# Patient Record
Sex: Male | Born: 1940
Health system: Southern US, Community
[De-identification: ages and names within clinical notes are randomized; demographics above are authoritative.]

## PROBLEM LIST (undated history)

## (undated) DIAGNOSIS — R0602 Shortness of breath: Secondary | ICD-10-CM

## (undated) DIAGNOSIS — N3941 Urge incontinence: Secondary | ICD-10-CM

## (undated) DIAGNOSIS — Z87442 Personal history of urinary calculi: Secondary | ICD-10-CM

## (undated) DIAGNOSIS — R972 Elevated prostate specific antigen [PSA]: Secondary | ICD-10-CM

## (undated) DIAGNOSIS — I6529 Occlusion and stenosis of unspecified carotid artery: Secondary | ICD-10-CM

## (undated) DIAGNOSIS — N200 Calculus of kidney: Secondary | ICD-10-CM

## (undated) DIAGNOSIS — G4733 Obstructive sleep apnea (adult) (pediatric): Secondary | ICD-10-CM

## (undated) DIAGNOSIS — N111 Chronic obstructive pyelonephritis: Secondary | ICD-10-CM

## (undated) DIAGNOSIS — N2889 Other specified disorders of kidney and ureter: Secondary | ICD-10-CM

## (undated) DIAGNOSIS — J3489 Other specified disorders of nose and nasal sinuses: Secondary | ICD-10-CM

## (undated) DIAGNOSIS — F418 Other specified anxiety disorders: Secondary | ICD-10-CM

## (undated) DIAGNOSIS — T8859XA Other complications of anesthesia, initial encounter: Secondary | ICD-10-CM

## (undated) DIAGNOSIS — N401 Enlarged prostate with lower urinary tract symptoms: Secondary | ICD-10-CM

## (undated) DIAGNOSIS — E785 Hyperlipidemia, unspecified: Secondary | ICD-10-CM

## (undated) DIAGNOSIS — N138 Other obstructive and reflux uropathy: Secondary | ICD-10-CM

## (undated) DIAGNOSIS — I480 Paroxysmal atrial fibrillation: Secondary | ICD-10-CM

## (undated) DIAGNOSIS — M810 Age-related osteoporosis without current pathological fracture: Secondary | ICD-10-CM

## (undated) DIAGNOSIS — R011 Cardiac murmur, unspecified: Secondary | ICD-10-CM

## (undated) DIAGNOSIS — I493 Ventricular premature depolarization: Secondary | ICD-10-CM

## (undated) DIAGNOSIS — L570 Actinic keratosis: Secondary | ICD-10-CM

## (undated) HISTORY — PX: CYSTOSCOPY: SUR368

## (undated) HISTORY — DX: Chronic obstructive pyelonephritis: N11.1

## (undated) HISTORY — DX: Obstructive sleep apnea (adult) (pediatric): G47.33

## (undated) HISTORY — DX: Calculus of kidney: N20.0

## (undated) HISTORY — PX: TONSILECTOMY, ADENOIDECTOMY, BILATERAL MYRINGOTOMY AND TUBES: SHX2538

## (undated) HISTORY — DX: Urge incontinence: N39.41

## (undated) HISTORY — DX: Age-related osteoporosis without current pathological fracture: M81.0

## (undated) HISTORY — DX: Occlusion and stenosis of unspecified carotid artery: I65.29

## (undated) HISTORY — DX: Elevated prostate specific antigen (PSA): R97.20

## (undated) HISTORY — DX: Other obstructive and reflux uropathy: N40.1

## (undated) HISTORY — DX: Ventricular premature depolarization: I49.3

## (undated) HISTORY — DX: Other specified disorders of nose and nasal sinuses: J34.89

## (undated) HISTORY — DX: Other specified anxiety disorders: F41.8

## (undated) HISTORY — DX: Benign prostatic hyperplasia with lower urinary tract symptoms: N13.8

## (undated) HISTORY — DX: Actinic keratosis: L57.0

## (undated) HISTORY — DX: Other specified disorders of kidney and ureter: N28.89

## (undated) HISTORY — DX: Hyperlipidemia, unspecified: E78.5

---

## 2004-01-09 ENCOUNTER — Emergency Department (HOSPITAL_COMMUNITY): Admission: AD | Admit: 2004-01-09 | Discharge: 2004-01-09 | Payer: Self-pay | Admitting: Family Medicine

## 2007-12-14 LAB — HM COLONOSCOPY

## 2008-02-11 HISTORY — PX: COLONOSCOPY: SHX174

## 2011-12-28 DIAGNOSIS — Z87442 Personal history of urinary calculi: Secondary | ICD-10-CM | POA: Insufficient documentation

## 2011-12-28 DIAGNOSIS — Z8709 Personal history of other diseases of the respiratory system: Secondary | ICD-10-CM | POA: Insufficient documentation

## 2012-01-18 DIAGNOSIS — F419 Anxiety disorder, unspecified: Secondary | ICD-10-CM | POA: Insufficient documentation

## 2012-03-06 DIAGNOSIS — R5383 Other fatigue: Secondary | ICD-10-CM | POA: Insufficient documentation

## 2012-03-06 DIAGNOSIS — R42 Dizziness and giddiness: Secondary | ICD-10-CM | POA: Insufficient documentation

## 2012-03-13 HISTORY — PX: CARDIOVASCULAR STRESS TEST: SHX262

## 2012-03-13 HISTORY — PX: CARDIAC CATHETERIZATION: SHX172

## 2012-03-13 HISTORY — PX: US ECHOCARDIOGRAPHY: HXRAD669

## 2012-03-16 DIAGNOSIS — R9431 Abnormal electrocardiogram [ECG] [EKG]: Secondary | ICD-10-CM | POA: Insufficient documentation

## 2012-03-30 DIAGNOSIS — R0600 Dyspnea, unspecified: Secondary | ICD-10-CM | POA: Insufficient documentation

## 2012-03-30 DIAGNOSIS — I519 Heart disease, unspecified: Secondary | ICD-10-CM | POA: Insufficient documentation

## 2012-12-13 DIAGNOSIS — N200 Calculus of kidney: Secondary | ICD-10-CM

## 2012-12-13 DIAGNOSIS — R972 Elevated prostate specific antigen [PSA]: Secondary | ICD-10-CM

## 2012-12-13 HISTORY — DX: Elevated prostate specific antigen (PSA): R97.20

## 2012-12-13 HISTORY — DX: Calculus of kidney: N20.0

## 2013-01-13 HISTORY — PX: CATARACT EXTRACTION: SUR2

## 2013-09-12 ENCOUNTER — Ambulatory Visit: Payer: Medicare Other | Admitting: Family Medicine

## 2013-11-27 ENCOUNTER — Ambulatory Visit (INDEPENDENT_AMBULATORY_CARE_PROVIDER_SITE_OTHER): Payer: Medicare Other | Admitting: Family Medicine

## 2013-11-27 ENCOUNTER — Encounter: Payer: Self-pay | Admitting: Family Medicine

## 2013-11-27 VITALS — BP 122/68 | HR 67 | Temp 97.6°F | Ht 66.0 in | Wt 154.5 lb

## 2013-11-27 DIAGNOSIS — R972 Elevated prostate specific antigen [PSA]: Secondary | ICD-10-CM

## 2013-11-27 DIAGNOSIS — I4949 Other premature depolarization: Secondary | ICD-10-CM

## 2013-11-27 DIAGNOSIS — I493 Ventricular premature depolarization: Secondary | ICD-10-CM | POA: Insufficient documentation

## 2013-11-27 DIAGNOSIS — Z87442 Personal history of urinary calculi: Secondary | ICD-10-CM | POA: Insufficient documentation

## 2013-11-27 DIAGNOSIS — N200 Calculus of kidney: Secondary | ICD-10-CM

## 2013-11-27 DIAGNOSIS — F341 Dysthymic disorder: Secondary | ICD-10-CM

## 2013-11-27 DIAGNOSIS — F418 Other specified anxiety disorders: Secondary | ICD-10-CM

## 2013-11-27 MED ORDER — HYDROCODONE-ACETAMINOPHEN 5-325 MG PO TABS: 1.0000 | ORAL_TABLET | ORAL | Status: DC | PRN

## 2013-11-27 MED ORDER — CLONAZEPAM 1 MG PO TABS: 1.0000 mg | ORAL_TABLET | Freq: Two times a day (BID) | ORAL | Status: DC

## 2013-11-27 NOTE — Assessment & Plan Note (Signed)
Stable on metoprolol XL.  Denies h/o HTN or OSA.

## 2013-11-27 NOTE — Progress Notes (Signed)
Pre-visit discussion using our clinic review tool. No additional management support is needed unless otherwise documented below in the visit note.  

## 2013-11-27 NOTE — Progress Notes (Signed)
Subjective:    Patient ID: [name], male    DOB: [birth year], 72 y.o.   MRN: [id number]  HPI CC: new pt to establish  Recently moved from [facility].  Saw Dr. Debroah Loop then Dr Thora Lance. In process of getting back together with wife [name].  She has 2 step sons.  On hydrocodone for h/o kidney stones.  Has had 3 kidney stones in last 5 months.  Prior saw Dr. Edwin Cap of [facility] [phone] Elevated PSA - Has had 3 prostate biopsies, per patient benign.  Last biopsy unsure date.  Sees urology every 6-12 months. On klonopin for anxiety.  Overall doing well with this. Pending cataract surgery  Lives alone currently in apartment 1 grown daughter Working on getting back together with wife - [name] (legally separated) Occupation: retired, worked in Retail buyer for 30 yrs Edu: GED Activity: walking some Diet: good wtaer, fruits/vegetables daily.  Advanced directives: daughter is HCPOA for now  Preventative: Last CPE about 1 year ago Flu shot - at [facility] Pneumovax 2012 Tdap 2012 zostavax - unsure  Medications and allergies reviewed and updated in chart.  Past histories reviewed and updated if relevant as below. Patient Active Problem List   Diagnosis Date Noted  . Recurrent kidney stones   . Elevated PSA   . Depression with anxiety   . Symptomatic PVCs    Past Medical History  Diagnosis Date  . Recurrent kidney stones 2014    sees urology, hydrocodone prn  . Elevated PSA 2014    sees urology - norma biopsies x 3.5  . Depression with anxiety   . Nasal obstruction     congenital R sided  . OSA (obstructive sleep apnea)     pt denies  . Symptomatic PVCs     per prior cardiologist, on beta blocker   Past Surgical History  Procedure Laterality Date  . Tonsilectomy, adenoidectomy, bilateral myringotomy and tubes      removed as a child  . Cardiac catheterization  03/2012    EF 45-50%, no LVH, + impaired LV relaxation, no PH, no valve abnormalities    History  Substance Use Topics  . Smoking status: Never Smoker   . Smokeless tobacco: Never Used  . Alcohol Use: No   Family History  Problem Relation Age of Onset  . Diabetes Neg Hx   . Cancer Neg Hx   . CAD Neg Hx   . Stroke Neg Hx   . Hypertension Neg Hx    No Known Allergies No current outpatient prescriptions on file prior to visit.   No current facility-administered medications on file prior to visit.     Review of Systems Per HPI    Objective:   Physical Exam  Nursing note and vitals reviewed. Constitutional: He is oriented to person, place, and time. He appears well-developed and well-nourished. No distress.  HENT:  Head: Normocephalic and atraumatic.  Right Ear: Hearing, tympanic membrane, external ear and ear canal normal.  Left Ear: Hearing, tympanic membrane, external ear and ear canal normal.  Nose: Nose normal.  Mouth/Throat: Oropharynx is clear and moist. No oropharyngeal exudate.  Eyes: Conjunctivae and EOM are normal. Pupils are equal, round, and reactive to light. No scleral icterus.  Neck: Normal range of motion. Neck supple. No thyromegaly present.  Cardiovascular: Normal rate, regular rhythm, normal heart sounds and intact distal pulses.   No murmur heard. Pulses:      Radial pulses are 2+ on the right side, and  2+ on the left side.  Pulmonary/Chest: Effort normal and breath sounds normal. No respiratory distress. He has no wheezes. He has no rales.  Musculoskeletal: Normal range of motion. He exhibits no edema.  Lymphadenopathy:    He has no cervical adenopathy.  Neurological: He is alert and oriented to person, place, and time.  CN grossly intact, station and gait intact  Skin: Skin is warm and dry. No rash noted.  Psychiatric: He has a normal mood and affect. His behavior is normal. Judgment and thought content normal.       Assessment & Plan:

## 2013-11-27 NOTE — Assessment & Plan Note (Signed)
Prior saw Dr. Edwin Cap of [facility].  Requests referral to establish with local urologist - states last PSA was around 13, and he has had 3 normal prostate biopsies. I will request records from urology and try to forward them on to urology

## 2013-11-27 NOTE — Assessment & Plan Note (Signed)
I have requested records from urology to review metabolic workup pt states he has had States has had 10+ kidney stones in last several months.

## 2013-11-27 NOTE — Patient Instructions (Addendum)
See Marcelle Smiling on your way out to sign controlled substance agreement form and for urine drug screen Pass by Marion's office to schedule appointment with urology.  Sign release form for records from urologist Dr. Edwin Cap in [facility]. Good to meet you today, call us with questions. Return at your convenience in the next few months fasting for blood work and afterwards for physical.

## 2013-11-27 NOTE — Assessment & Plan Note (Signed)
Stable on klonopin 1mg  1/2 to 1 tablet twice daily.  Consider antidepressant down the road. Continue this med, will have patient fill out controlled substance agreement form as well as UDS today.

## 2013-12-18 ENCOUNTER — Encounter: Payer: Self-pay | Admitting: Family Medicine

## 2013-12-25 ENCOUNTER — Encounter: Payer: Self-pay | Admitting: Family Medicine

## 2014-01-22 ENCOUNTER — Other Ambulatory Visit: Payer: Self-pay | Admitting: Family Medicine

## 2014-01-22 NOTE — Telephone Encounter (Signed)
Rx called in as directed.   

## 2014-01-22 NOTE — Telephone Encounter (Signed)
plz phone in. 

## 2014-01-22 NOTE — Telephone Encounter (Signed)
Ok to refill 

## 2014-02-04 ENCOUNTER — Encounter: Payer: Self-pay | Admitting: Family Medicine

## 2014-02-04 DIAGNOSIS — N401 Enlarged prostate with lower urinary tract symptoms: Secondary | ICD-10-CM

## 2014-02-04 DIAGNOSIS — N138 Other obstructive and reflux uropathy: Secondary | ICD-10-CM | POA: Insufficient documentation

## 2014-02-11 ENCOUNTER — Other Ambulatory Visit: Payer: Self-pay | Admitting: Family Medicine

## 2014-02-11 DIAGNOSIS — N200 Calculus of kidney: Secondary | ICD-10-CM

## 2014-02-11 DIAGNOSIS — Z13220 Encounter for screening for lipoid disorders: Secondary | ICD-10-CM

## 2014-02-11 DIAGNOSIS — I493 Ventricular premature depolarization: Secondary | ICD-10-CM

## 2014-02-12 ENCOUNTER — Other Ambulatory Visit: Payer: Self-pay | Admitting: Family Medicine

## 2014-02-12 ENCOUNTER — Other Ambulatory Visit (INDEPENDENT_AMBULATORY_CARE_PROVIDER_SITE_OTHER): Payer: Medicare Other

## 2014-02-12 DIAGNOSIS — Z13220 Encounter for screening for lipoid disorders: Secondary | ICD-10-CM

## 2014-02-12 DIAGNOSIS — N200 Calculus of kidney: Secondary | ICD-10-CM

## 2014-02-12 DIAGNOSIS — R972 Elevated prostate specific antigen [PSA]: Secondary | ICD-10-CM

## 2014-02-12 DIAGNOSIS — I4949 Other premature depolarization: Secondary | ICD-10-CM

## 2014-02-12 DIAGNOSIS — I493 Ventricular premature depolarization: Secondary | ICD-10-CM

## 2014-02-12 LAB — COMPREHENSIVE METABOLIC PANEL
ALK PHOS: 56 U/L (ref 39–117)
ALT: 23 U/L (ref 0–53)
AST: 22 U/L (ref 0–37)
Albumin: 3.7 g/dL (ref 3.5–5.2)
BILIRUBIN TOTAL: 1.2 mg/dL (ref 0.3–1.2)
BUN: 17 mg/dL (ref 6–23)
CO2: 27 meq/L (ref 19–32)
Calcium: 8.9 mg/dL (ref 8.4–10.5)
Chloride: 106 mEq/L (ref 96–112)
Creatinine, Ser: 1.2 mg/dL (ref 0.4–1.5)
GFR: 64.04 mL/min (ref >60.00)
GLUCOSE: 108 mg/dL — AB (ref 70–99)
Potassium: 3.9 mEq/L (ref 3.5–5.1)
Sodium: 139 mEq/L (ref 135–145)
TOTAL PROTEIN: 6.3 g/dL (ref 6.0–8.3)

## 2014-02-12 LAB — LIPID PANEL
CHOLESTEROL: 180 mg/dL (ref 0–200)
HDL: 42.9 mg/dL (ref >39.00)
LDL CALC: 119 mg/dL — AB (ref 0–99)
TRIGLYCERIDES: 89 mg/dL (ref 0.0–149.0)
Total CHOL/HDL Ratio: 4
VLDL: 17.8 mg/dL (ref 0.0–40.0)

## 2014-02-12 LAB — PSA: PSA: 14.3 ng/mL — AB (ref 0.10–4.00)

## 2014-02-12 LAB — TSH: TSH: 1.49 u[IU]/mL (ref 0.35–5.50)

## 2014-02-19 ENCOUNTER — Ambulatory Visit (INDEPENDENT_AMBULATORY_CARE_PROVIDER_SITE_OTHER): Payer: Medicare Other | Admitting: Family Medicine

## 2014-02-19 ENCOUNTER — Encounter: Payer: Self-pay | Admitting: Family Medicine

## 2014-02-19 VITALS — BP 122/70 | HR 64 | Temp 97.6°F | Ht 67.5 in | Wt 156.1 lb

## 2014-02-19 DIAGNOSIS — F341 Dysthymic disorder: Secondary | ICD-10-CM

## 2014-02-19 DIAGNOSIS — R7303 Prediabetes: Secondary | ICD-10-CM | POA: Insufficient documentation

## 2014-02-19 DIAGNOSIS — Z23 Encounter for immunization: Secondary | ICD-10-CM

## 2014-02-19 DIAGNOSIS — R972 Elevated prostate specific antigen [PSA]: Secondary | ICD-10-CM

## 2014-02-19 DIAGNOSIS — R7309 Other abnormal glucose: Secondary | ICD-10-CM

## 2014-02-19 DIAGNOSIS — Z0000 Encounter for general adult medical examination without abnormal findings: Secondary | ICD-10-CM | POA: Insufficient documentation

## 2014-02-19 DIAGNOSIS — R739 Hyperglycemia, unspecified: Secondary | ICD-10-CM

## 2014-02-19 DIAGNOSIS — F418 Other specified anxiety disorders: Secondary | ICD-10-CM

## 2014-02-19 NOTE — Addendum Note (Signed)
Addended by: Royann Shivers A on: 02/19/2014 04:45 PM   Modules accepted: Orders

## 2014-02-19 NOTE — Progress Notes (Signed)
BP 122/70  Pulse 64  Temp(Src) 97.6 F (36.4 C) (Oral)  Ht 5' 7.5" (1.715 m)  Wt 156 lb 1.9 oz (70.816 kg)  BMI 24.08 kg/m2   CC: medicare wellness visit  Subjective:    Patient ID: [name], male    DOB: 1941/08/02, 73 y.o.   MRN: [id number]  HPI: [name] is a 73 y.o. male presenting on 02/19/2014 with Annual Exam   H/o elevated PSA - followed by Dr [name]. Anxiety stable on klonopin 1mg  bid.  Passes hearing and vision screens today. Denies falls, depression, anhedonia  Lives alone currently in apartment  1 grown daughter  Working on getting back together with wife - Allah Reason (legally separated)  Occupation: retired, worked in Public relations account executive for 30 yrs  Edu: GED  Activity: walking some  Diet: good wtaer, fruits/vegetables daily.   Preventative:  Last CPE about 1 year ago  Colon cancer screening - mod diverticulosis o/w WNL (2009) Prostate cancer screening - followed by urology Flu shot - 10/2013 Pneumovax 01/2011 Tdap 01/2011  zostavax - unsure  Advanced directives: daughter is HCPOA for now   Relevant past medical, surgical, family and social history reviewed and updated as indicated.  Allergies and medications reviewed and updated. Current Outpatient Prescriptions on File Prior to Visit  Medication Sig  . bisacodyl (DULCOLAX) 5 MG EC tablet Take 5 mg by mouth once a week.  . clonazePAM (KLONOPIN) 1 MG tablet TAKE 1 TABLET BY MOUTH TWICE DAILY  . HYDROcodone-acetaminophen (NORCO/VICODIN) 5-325 MG per tablet Take 1 tablet by mouth every 4 (four) hours as needed for moderate pain.  . metoprolol succinate (TOPROL-XL) 25 MG 24 hr tablet TAKE 1 TABLET BY MOUTH ONCE A DAY   No current facility-administered medications on file prior to visit.    Review of Systems  Constitutional: Negative for fever, chills, activity change, appetite change, fatigue and unexpected weight change.  HENT: Negative for hearing loss.   Eyes: Positive for visual disturbance  (pending cataract surgery).  Respiratory: Negative for cough, chest tightness, shortness of breath and wheezing.   Cardiovascular: Negative for chest pain, palpitations and leg swelling.  Gastrointestinal: Negative for nausea, vomiting, abdominal pain, diarrhea, constipation, blood in stool and abdominal distention.  Genitourinary: Negative for hematuria and difficulty urinating.  Musculoskeletal: Negative for arthralgias, myalgias and neck pain.  Skin: Negative for rash.  Neurological: Negative for dizziness, seizures, syncope and headaches.  Hematological: Negative for adenopathy. Does not bruise/bleed easily.  Psychiatric/Behavioral: Negative for dysphoric mood. The patient is not nervous/anxious.    Per HPI unless specifically indicated above    Objective:    BP 122/70  Pulse 64  Temp(Src) 97.6 F (36.4 C) (Oral)  Ht 5' 7.5" (1.715 m)  Wt 156 lb 1.9 oz (70.816 kg)  BMI 24.08 kg/m2  Physical Exam  Nursing note and vitals reviewed. Constitutional: He is oriented to person, place, and time. He appears well-developed and well-nourished. No distress.  HENT:  Head: Normocephalic and atraumatic.  Right Ear: Hearing, tympanic membrane, external ear and ear canal normal.  Left Ear: Hearing, tympanic membrane, external ear and ear canal normal.  Nose: Nose normal.  Mouth/Throat: Uvula is midline, oropharynx is clear and moist and mucous membranes are normal. No oropharyngeal exudate, posterior oropharyngeal edema or posterior oropharyngeal erythema.  Eyes: Conjunctivae and EOM are normal. Pupils are equal, round, and reactive to light. No scleral icterus.  Neck: Normal range of motion. Neck supple.  Cardiovascular: Normal rate, regular rhythm, normal heart  sounds and intact distal pulses.   No murmur heard. Pulses:      Radial pulses are 2+ on the right side, and 2+ on the left side.  Pulmonary/Chest: Effort normal and breath sounds normal. No respiratory distress. He has no wheezes.  He has no rales.  Abdominal: Soft. Bowel sounds are normal. He exhibits no distension and no mass. There is no tenderness. There is no rebound and no guarding.  Genitourinary:  deferred  Musculoskeletal: Normal range of motion. He exhibits no edema.  Lymphadenopathy:    He has no cervical adenopathy.  Neurological: He is alert and oriented to person, place, and time.  CN grossly intact, station and gait intact 2/3 recall 5/5 calculation serial 3s  Skin: Skin is warm and dry. No rash noted.  Psychiatric: He has a normal mood and affect. His behavior is normal. Judgment and thought content normal.   Results for orders placed in visit on 02/19/14  HM COLONOSCOPY      Result Value Ref Range   HM Colonoscopy diverticulosis        Assessment & Plan:   Problem List Items Addressed This Visit   Depression with anxiety     Chronic, stable. Continue klonopin 1mg  1/2 to 1 tablet bid. Pt does not desire to change regimen for now.    Elevated PSA     Continue follow up with Dr. [name].  Appreciate urology care of patient. PSA recently elevated, in h/o chronic PSA elevation.    Medicare annual wellness visit, subsequent - Primary     I have personally reviewed the Medicare Annual Wellness questionnaire and have noted 1. The patient's medical and social history 2. Their use of alcohol, tobacco or illicit drugs 3. Their current medications and supplements 4. The patient's functional ability including ADL's, fall risks, home safety risks and hearing or visual impairment. 5. Diet and physical activity 6. Evidence for depression or mood disorders The patients weight, height, BMI have been recorded in the chart.  Hearing and vision has been addressed. I have made referrals, counseling and provided education to the patient based review of the above and I have provided the pt with a written personalized care plan for preventive services. See scanned questionairre. Advanced directives discussed:  would want daughter to be HCPOA for now.  Reviewed preventative protocols and updated unless pt declined. prevnar today UTD colonoscopy Prostate followed by Dr. [name].        Follow up plan: Return in about 1 year (around 02/20/2015), or as needed, for medicare wellness visit.

## 2014-02-19 NOTE — Patient Instructions (Addendum)
prevnar today Call your insurance about the shingles shot to see if it is covered or how much it would cost and where is cheaper (here or pharmacy).  If you want to receive here, call for nurse visit. Good to see you today, call us with questions. Watch added sugar in diet. Return as needed or in 1 year for next wellness exam.

## 2014-02-19 NOTE — Assessment & Plan Note (Signed)
Chronic, stable. Continue klonopin 1mg  1/2 to 1 tablet bid. Pt does not desire to change regimen for now.

## 2014-02-19 NOTE — Assessment & Plan Note (Signed)
Discussed elevated sugar. Recommend limit added sugars in diet, limit sweetened beverages

## 2014-02-19 NOTE — Assessment & Plan Note (Signed)
Continue follow up with Dr. [name].  Appreciate urology care of patient. PSA recently elevated, in h/o chronic PSA elevation.

## 2014-02-19 NOTE — Assessment & Plan Note (Signed)
I have personally reviewed the Medicare Annual Wellness questionnaire and have noted 1. The patient's medical and social history 2. Their use of alcohol, tobacco or illicit drugs 3. Their current medications and supplements 4. The patient's functional ability including ADL's, fall risks, home safety risks and hearing or visual impairment. 5. Diet and physical activity 6. Evidence for depression or mood disorders The patients weight, height, BMI have been recorded in the chart.  Hearing and vision has been addressed. I have made referrals, counseling and provided education to the patient based review of the above and I have provided the pt with a written personalized care plan for preventive services. See scanned questionairre. Advanced directives discussed: would want daughter to be HCPOA for now.  Reviewed preventative protocols and updated unless pt declined. prevnar today UTD colonoscopy Prostate followed by Dr. [name].

## 2014-02-19 NOTE — Progress Notes (Signed)
Pre visit review using our clinic review tool, if applicable. No additional management support is needed unless otherwise documented below in the visit note. 

## 2014-03-10 ENCOUNTER — Encounter: Payer: Self-pay | Admitting: Family Medicine

## 2014-04-18 ENCOUNTER — Other Ambulatory Visit: Payer: Self-pay | Admitting: Family Medicine

## 2014-04-18 NOTE — Telephone Encounter (Signed)
Rx called in as directed.   

## 2014-04-18 NOTE — Telephone Encounter (Signed)
plz phone in. 

## 2014-04-19 ENCOUNTER — Other Ambulatory Visit: Payer: Medicare Other

## 2014-04-30 ENCOUNTER — Encounter: Payer: Self-pay | Admitting: Family Medicine

## 2014-06-18 ENCOUNTER — Other Ambulatory Visit: Payer: Self-pay | Admitting: Family Medicine

## 2014-06-18 NOTE — Telephone Encounter (Signed)
plz phone in. 

## 2014-06-18 NOTE — Telephone Encounter (Signed)
Ok to refill 

## 2014-06-19 NOTE — Telephone Encounter (Signed)
Rx called in as directed.   

## 2014-08-20 ENCOUNTER — Other Ambulatory Visit: Payer: Self-pay | Admitting: Family Medicine

## 2014-08-20 NOTE — Telephone Encounter (Signed)
Ok to refill 

## 2014-08-21 NOTE — Telephone Encounter (Signed)
Plz phone in

## 2014-08-21 NOTE — Telephone Encounter (Signed)
Rx called in as directed.   

## 2014-09-09 ENCOUNTER — Ambulatory Visit: Payer: Medicare Other

## 2014-09-12 DIAGNOSIS — N2889 Other specified disorders of kidney and ureter: Secondary | ICD-10-CM

## 2014-09-12 DIAGNOSIS — N111 Chronic obstructive pyelonephritis: Secondary | ICD-10-CM

## 2014-09-12 HISTORY — DX: Other specified disorders of kidney and ureter: N28.89

## 2014-09-12 HISTORY — DX: Chronic obstructive pyelonephritis: N11.1

## 2014-09-13 ENCOUNTER — Ambulatory Visit (INDEPENDENT_AMBULATORY_CARE_PROVIDER_SITE_OTHER): Payer: Medicare Other | Admitting: Family Medicine

## 2014-09-13 ENCOUNTER — Encounter: Payer: Self-pay | Admitting: Family Medicine

## 2014-09-13 VITALS — BP 130/72 | HR 47 | Temp 97.9°F | Wt 157.2 lb

## 2014-09-13 DIAGNOSIS — R29898 Other symptoms and signs involving the musculoskeletal system: Secondary | ICD-10-CM

## 2014-09-13 DIAGNOSIS — M545 Low back pain, unspecified: Secondary | ICD-10-CM

## 2014-09-13 DIAGNOSIS — M549 Dorsalgia, unspecified: Secondary | ICD-10-CM | POA: Insufficient documentation

## 2014-09-13 DIAGNOSIS — I493 Ventricular premature depolarization: Secondary | ICD-10-CM

## 2014-09-13 NOTE — Assessment & Plan Note (Signed)
Mild bradycardia noted today and pt endorses some fatigue - will decrease metoprolol xl to 12.5mg  daily.

## 2014-09-13 NOTE — Progress Notes (Signed)
BP 130/72  Pulse 47  Temp(Src) 97.9 F (36.6 C) (Oral)  Wt 157 lb 4 oz (71.328 kg)  SpO2 97%   CC: back pain, knee pain  Subjective:    Patient ID: [name], male    DOB: 02/18/1941, 73 y.o.   MRN: [id number]  HPI: [name] is a 73 y.o. 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 Pain has improved but persistent (left lower back). No radiation down legs, denies numbness or weakness of legs as well. No fevers/chills. So far has tried nothing other than ibuprofen 200mg . Initially also treated with heating pad.  L knee "cracking/popping" - ongoing for last 1.5 yrs, worse with climbing stairs, some popping with flexion/extension. Now starting to have R popping pain as well. L>R. No pain.  Has hydrocodone for kidney stone pain, hasn't tried for anything else.  Bradycardia - HR today 47. Endorses some fatigue.  Relevant past medical, surgical, family and social history reviewed and updated as indicated.  Allergies and medications reviewed and updated. Current Outpatient Prescriptions on File Prior to Visit  Medication Sig  . bisacodyl (DULCOLAX) 5 MG EC tablet Take 5 mg by mouth once a week.  . clonazePAM (KLONOPIN) 1 MG tablet TAKE 1 TABLET BY MOUTH 2 TIMES A DAY AS NEEDED  . HYDROcodone-acetaminophen (NORCO/VICODIN) 5-325 MG per tablet Take 1 tablet by mouth every 4 (four) hours as needed for moderate pain.  . Omega-3 Fatty Acids (FISH OIL) 1000 MG CAPS Take 1 capsule by mouth 2 (two) times daily.   No current facility-administered medications on file prior to visit.   Past Medical History  Diagnosis Date  . Recurrent kidney stones 2014    sees urology [name]), hydrocodone prn  . Elevated PSA 2014    sees urology - normal biopsies x 3  . Depression with anxiety   . Nasal obstruction     congenital R sided  . OSA (obstructive sleep  apnea)     pt denies  . Symptomatic PVCs     per prior cardiologist, on beta blocker  . BPH with urinary obstruction     sees urology Dr. [name]    Past Surgical History  Procedure Laterality Date  . Tonsilectomy, adenoidectomy, bilateral myringotomy and tubes      removed as a child  . Cardiac catheterization  03/2012    EF 45-50%, no LVH, + impaired LV relaxation, no PH, no valve abnormalities  . US echocardiography  03/2012    hypokinetic anterior wall, EF 45-50%, impaired relaxation pattern, mild LA dilation, mild-mod MR, mild-mod AR  . Colonoscopy  02/2008    mod diverticulosis o/w WNL (2009)  . Cataract extraction Right 01/2013  . Cystoscopy    . Total hip arthroplasty Left   . Total hip arthroplasty Right   . Cardiovascular stress test  03/2012    mild peri infarct ischemia in apical segment, decreased EF   Review of Systems Per HPI unless specifically indicated above    Objective:    BP 130/72  Pulse 47  Temp(Src) 97.9 F (36.6 C) (Oral)  Wt 157 lb 4 oz (71.328 kg)  SpO2 97%  Physical Exam  Nursing note and vitals reviewed. Constitutional: He appears well-developed and well-nourished. No distress.  Cardiovascular: Normal rate, regular rhythm, normal heart sounds and intact distal pulses.   No  murmur heard. HR 60s  Musculoskeletal: He exhibits no edema.  No pain midline spine No paraspinous mm tenderness Neg SLR bilaterally. R knee WNL L Knee exam: No deformity on inspection. No pain with palpation of knee landmarks. No effusion/swelling noted. FROM in flex/extension without crepitus. No popliteal fullness. Neg drawer test. Neg mcmurray test. No pain with valgus/varus stress. No PFgrind. No abnormal patellar mobility.       Assessment & Plan:   Problem List Items Addressed This Visit   Symptomatic PVCs     Mild bradycardia noted today and pt endorses some fatigue - will decrease metoprolol xl to 12.5mg  daily.    Relevant Medications       metoprolol succinate (TOPROL-XL) 25 MG 24 hr tablet   Popping sound of knee joint     Anticipate early arthritis - provided with strengthening exercises for muscles around knee joint, suggested start vit D and glucosamine for prevention. Pt agrees with plan.    Left-sided back pain - Primary     Anticipate muscle strain. Treat with tylenol and heating pad. Less likely HNP given pain significantly improved. Neg SLR today. Update if recurring or worsening pain.        Follow up plan: Return if symptoms worsen or fail to improve.

## 2014-09-13 NOTE — Patient Instructions (Addendum)
Decrease metoprolol to 1/2 tablet daily (12.5mg  daily). For knees - I think this is early arthritis changes, but as no pain ok to monitor for now. Do exercises provided. Look into glucosamine (osteo bi flex) and vitamin D for joint health. For back - I think you have a strain of a back muscle. May use tylenol 500mg  twice daily for pain, may continue heating pad. Do exercises provided for lower back pain. Let us know if not improved with this.

## 2014-09-13 NOTE — Assessment & Plan Note (Signed)
Anticipate early arthritis - provided with strengthening exercises for muscles around knee joint, suggested start vit D and glucosamine for prevention. Pt agrees with plan.

## 2014-09-13 NOTE — Progress Notes (Signed)
Pre visit review using our clinic review tool, if applicable. No additional management support is needed unless otherwise documented below in the visit note. 

## 2014-09-13 NOTE — Assessment & Plan Note (Signed)
Anticipate muscle strain. Treat with tylenol and heating pad. Less likely HNP given pain significantly improved. Neg SLR today. Update if recurring or worsening pain.

## 2014-10-07 LAB — BASIC METABOLIC PANEL
Anion Gap: 8 (ref 7–16)
BUN: 29 mg/dL — AB (ref 7–18)
CALCIUM: 8.7 mg/dL (ref 8.5–10.1)
Chloride: 111 mmol/L — ABNORMAL HIGH (ref 98–107)
Co2: 25 mmol/L (ref 21–32)
Creatinine: 2.36 mg/dL — ABNORMAL HIGH (ref 0.60–1.30)
EGFR (African American): 35 — ABNORMAL LOW
GFR CALC NON AF AMER: 29 — AB
Glucose: 120 mg/dL — ABNORMAL HIGH (ref 65–99)
Osmolality: 294 (ref 275–301)
Potassium: 4 mmol/L (ref 3.5–5.1)
SODIUM: 144 mmol/L (ref 136–145)

## 2014-10-07 LAB — URINALYSIS, COMPLETE
BILIRUBIN, UR: NEGATIVE
Bacteria: NONE SEEN
Glucose,UR: NEGATIVE mg/dL (ref 0–75)
Leukocyte Esterase: NEGATIVE
Nitrite: NEGATIVE
Ph: 5 (ref 4.5–8.0)
Protein: NEGATIVE
RBC,UR: 17 /HPF (ref 0–5)
SQUAMOUS EPITHELIAL: NONE SEEN
Specific Gravity: 1.025 (ref 1.003–1.030)

## 2014-10-07 LAB — CBC WITH DIFFERENTIAL/PLATELET
Basophil #: 0.1 10*3/uL (ref 0.0–0.1)
Basophil %: 0.4 %
EOS ABS: 0 10*3/uL (ref 0.0–0.7)
EOS PCT: 0.3 %
HCT: 46.4 % (ref 40.0–52.0)
HGB: 14.8 g/dL (ref 13.0–18.0)
LYMPHS PCT: 4.6 %
Lymphocyte #: 0.7 10*3/uL — ABNORMAL LOW (ref 1.0–3.6)
MCH: 29.5 pg (ref 26.0–34.0)
MCHC: 32 g/dL (ref 32.0–36.0)
MCV: 92 fL (ref 80–100)
MONO ABS: 1.5 x10 3/mm — AB (ref 0.2–1.0)
MONOS PCT: 10.5 %
NEUTROS PCT: 84.2 %
Neutrophil #: 11.9 10*3/uL — ABNORMAL HIGH (ref 1.4–6.5)
Platelet: 90 10*3/uL — ABNORMAL LOW (ref 150–440)
RBC: 5.03 10*6/uL (ref 4.40–5.90)
RDW: 14 % (ref 11.5–14.5)
WBC: 14.2 10*3/uL — AB (ref 3.8–10.6)

## 2014-10-08 ENCOUNTER — Inpatient Hospital Stay: Payer: Self-pay | Admitting: Urology

## 2014-10-08 ENCOUNTER — Ambulatory Visit: Payer: Self-pay | Admitting: Urology

## 2014-10-08 LAB — CBC WITH DIFFERENTIAL/PLATELET
Basophil #: 0 10*3/uL (ref 0.0–0.1)
Basophil %: 0.3 %
EOS ABS: 0 10*3/uL (ref 0.0–0.7)
Eosinophil %: 0.3 %
HCT: 40.4 % (ref 40.0–52.0)
HGB: 12.7 g/dL — AB (ref 13.0–18.0)
LYMPHS ABS: 0.9 10*3/uL — AB (ref 1.0–3.6)
Lymphocyte %: 8.4 %
MCH: 29.3 pg (ref 26.0–34.0)
MCHC: 31.4 g/dL — AB (ref 32.0–36.0)
MCV: 93 fL (ref 80–100)
MONOS PCT: 10.2 %
Monocyte #: 1 x10 3/mm (ref 0.2–1.0)
NEUTROS ABS: 8.3 10*3/uL — AB (ref 1.4–6.5)
Neutrophil %: 80.8 %
Platelet: 85 10*3/uL — ABNORMAL LOW (ref 150–440)
RBC: 4.32 10*6/uL — AB (ref 4.40–5.90)
RDW: 14 % (ref 11.5–14.5)
WBC: 10.3 10*3/uL (ref 3.8–10.6)

## 2014-10-08 LAB — BASIC METABOLIC PANEL
ANION GAP: 7 (ref 7–16)
BUN: 27 mg/dL — AB (ref 7–18)
CALCIUM: 8 mg/dL — AB (ref 8.5–10.1)
Chloride: 114 mmol/L — ABNORMAL HIGH (ref 98–107)
Co2: 26 mmol/L (ref 21–32)
Creatinine: 1.78 mg/dL — ABNORMAL HIGH (ref 0.60–1.30)
EGFR (African American): 48 — ABNORMAL LOW
GFR CALC NON AF AMER: 40 — AB
Glucose: 120 mg/dL — ABNORMAL HIGH (ref 65–99)
OSMOLALITY: 299 (ref 275–301)
POTASSIUM: 4.2 mmol/L (ref 3.5–5.1)
Sodium: 147 mmol/L — ABNORMAL HIGH (ref 136–145)

## 2014-10-08 LAB — GRAM STAIN

## 2014-10-09 ENCOUNTER — Telehealth: Payer: Self-pay

## 2014-10-09 LAB — URINE CULTURE

## 2014-10-09 NOTE — Telephone Encounter (Signed)
[name] pts daughter said pt was discharged from [facility] and this morning pts urinary catheter is leaking at one of the connections; pt is also in a lot of pain. Pt is supposed to f/u with Dr Karna Dupes the urologist after discharge from [facility]. [name] will call Dr Ernest Mallick office to see if pt can be seen or what to do about pain and catheter leaking. [name] will call our office back if needed.

## 2014-10-12 LAB — CULTURE, BLOOD (SINGLE)

## 2014-10-13 HISTORY — PX: CYSTOSCOPY: SUR368

## 2014-10-14 ENCOUNTER — Ambulatory Visit (INDEPENDENT_AMBULATORY_CARE_PROVIDER_SITE_OTHER): Payer: Medicare Other | Admitting: Family Medicine

## 2014-10-14 ENCOUNTER — Encounter: Payer: Self-pay | Admitting: Family Medicine

## 2014-10-14 VITALS — BP 126/66 | HR 56 | Temp 97.4°F | Wt 150.0 lb

## 2014-10-14 DIAGNOSIS — I493 Ventricular premature depolarization: Secondary | ICD-10-CM

## 2014-10-14 DIAGNOSIS — Z01818 Encounter for other preprocedural examination: Secondary | ICD-10-CM | POA: Insufficient documentation

## 2014-10-14 DIAGNOSIS — N200 Calculus of kidney: Secondary | ICD-10-CM

## 2014-10-14 NOTE — Assessment & Plan Note (Signed)
Upcoming procedure. Will await labs and then fax today's note to Dr. Hollice Espy.

## 2014-10-14 NOTE — Patient Instructions (Signed)
Blood work today. We will fax today's note to Dr Hollice Espy urologist in [facility]. You should do well with surgery, then work hard to regain strength. Call us with questions.

## 2014-10-14 NOTE — Assessment & Plan Note (Signed)
Pending eval by Dr Ubaldo Glassing. Pt continues Toprol XL 12.5mg  daily.

## 2014-10-14 NOTE — Assessment & Plan Note (Addendum)
Pt is medically cleared for his upcoming urological procedure. Check CBC, BMP and PT/INR today. Did not check CXR. Pt will have EKG at upcoming cardiology clearance appointment. Discussed graduated compression stockings with patient, discussed importance of speedy ambulation. Would recommend caution with narcotics as he seems very sensitive to their effect (specifically hydrocodone noted today).

## 2014-10-14 NOTE — Progress Notes (Signed)
Pre visit review using our clinic review tool, if applicable. No additional management support is needed unless otherwise documented below in the visit note. 

## 2014-10-14 NOTE — Progress Notes (Addendum)
BP 126/66 mmHg  Pulse 56  Temp(Src) 97.4 F (36.3 C) (Oral)  Wt 150 lb (68.04 kg)   CC: medical clearance  Subjective:    Patient ID: [name], male    DOB: 04/13/1941, 73 y.o.   MRN: [id number]  HPI: [name] is a 73 y.o. male presenting on 10/14/2014 for Medical Clearance   Presents with [name] his daughter who lives in [place], [place]   Has appt with cards on Wednesday for cardiac clearance for symptomatic PVCs (Dr Ubaldo Glassing at [facility]).   Seen at [facility] with bladder infection and multiple kidney stones s/p L stent placement (Dr. Hollice Espy). Pending lithotripsy when infection cleared and stent removal. I do not have records of this. Prior saw Dr Barnie Del.   Denies current chest pain, tightness, dyspnea.  Has tolerated GETA in the past. Last surgery was last week - stent placement which he tolerated well.  No smoking hx. CARDIOVASCULAR STRESS TEST Date: 03/2012 mild peri infarct ischemia in apical segment, decreased EF   Relevant past medical, surgical, family and social history reviewed and updated as indicated.  Allergies and medications reviewed and updated. Current Outpatient Prescriptions on File Prior to Visit  Medication Sig  . clonazePAM (KLONOPIN) 1 MG tablet TAKE 1 TABLET BY MOUTH 2 TIMES A DAY AS NEEDED  . HYDROcodone-acetaminophen (NORCO/VICODIN) 5-325 MG per tablet Take 1 tablet by mouth every 4 (four) hours as needed for moderate pain.  . metoprolol succinate (TOPROL-XL) 25 MG 24 hr tablet TAKE 1/2 TABLET BY MOUTH ONCE A DAY  . bisacodyl (DULCOLAX) 5 MG EC tablet Take 5 mg by mouth once a week.  . Omega-3 Fatty Acids (FISH OIL) 1000 MG CAPS Take 1 capsule by mouth 2 (two) times daily.   No current facility-administered medications on file prior to visit.   Past Medical History  Diagnosis Date  . Recurrent kidney stones 2014    sees urology [name]), hydrocodone prn  . Elevated PSA 2014    sees urology - normal biopsies x 3  . Depression with  anxiety   . Nasal obstruction     congenital R sided  . OSA (obstructive sleep apnea)     pt denies  . Symptomatic PVCs     per prior cardiologist, on beta blocker  . BPH with urinary obstruction     sees urology Dr. [name]   Past Surgical History  Procedure Laterality Date  . Tonsilectomy, adenoidectomy, bilateral myringotomy and tubes      removed as a child  . Cardiac catheterization  03/2012    EF 45-50%, no LVH, + impaired LV relaxation, no PH, no valve abnormalities  . US echocardiography  03/2012    hypokinetic anterior wall, EF 45-50%, impaired relaxation pattern, mild LA dilation, mild-mod MR, mild-mod AR  . Colonoscopy  02/2008    mod diverticulosis o/w WNL (2009)  . Cataract extraction Right 01/2013  . Cystoscopy    . Total hip arthroplasty Left   . Total hip arthroplasty Right   . Cardiovascular stress test  03/2012    mild peri infarct ischemia in apical segment, decreased EF    Review of Systems Per HPI unless specifically indicated above    Objective:    BP 126/66 mmHg  Pulse 56  Temp(Src) 97.4 F (36.3 C) (Oral)  Wt 150 lb (68.04 kg)  Physical Exam  Constitutional: He appears well-developed and well-nourished. No distress.  Some slurring of speech after 1/2 hydrocodone earlier this morning  HENT:  Mouth/Throat: Oropharynx is clear and moist. No oropharyngeal exudate.  Cardiovascular: Normal rate, regular rhythm, normal heart sounds and intact distal pulses.   No murmur heard. Pulmonary/Chest: Effort normal and breath sounds normal. No respiratory distress. He has no wheezes. He has no rales.  Musculoskeletal: He exhibits no edema.  Skin: Skin is warm and dry. No rash noted.  Psychiatric: He has a normal mood and affect.  Nursing note and vitals reviewed.  Results for orders placed or performed in visit on 02/19/14  HM COLONOSCOPY  Result Value Ref Range   HM Colonoscopy diverticulosis       Assessment & Plan:   Problem List Items Addressed This  Visit    Symptomatic PVCs    Pending eval by Dr Ubaldo Glassing. Pt continues Toprol XL 12.5mg  daily.    Recurrent kidney stones    Upcoming procedure. Will await labs and then fax today's note to Dr. Hollice Espy.    Preoperative clearance - Primary    Pt is medically cleared for his upcoming urological procedure. Check CBC, BMP and PT/INR today. Did not check CXR. Pt will have EKG at upcoming cardiology clearance appointment. Discussed graduated compression stockings with patient, discussed importance of speedy ambulation. Would recommend caution with narcotics as he seems very sensitive to their effect (specifically hydrocodone noted today).    Relevant Orders      CBC with Differential      Basic metabolic panel      Protime-INR       Follow up plan: Return as needed.   ADDENDUM ==> reviewed hospitalization records admitted for L nephrolithiasis and obstructive pyelonephritis with emergent L stent placement and foley catheter for urinary retention. Also found to have L midpole renal mass. Cr 2.23 -> 1.78, GFR 40 Discharged on flomax, levaquin 250mg  daily, hydrocodone

## 2014-10-15 LAB — BASIC METABOLIC PANEL
BUN: 26 mg/dL — AB (ref 6–23)
CHLORIDE: 111 meq/L (ref 96–112)
CO2: 28 mEq/L (ref 19–32)
CREATININE: 1.6 mg/dL — AB (ref 0.4–1.5)
Calcium: 8.7 mg/dL (ref 8.4–10.5)
GFR: 46.66 mL/min — AB (ref >60.00)
Glucose, Bld: 95 mg/dL (ref 70–99)
POTASSIUM: 4.1 meq/L (ref 3.5–5.1)
Sodium: 146 mEq/L — ABNORMAL HIGH (ref 135–145)

## 2014-10-15 LAB — PROTIME-INR
INR: 1.2 ratio — ABNORMAL HIGH (ref 0.8–1.0)
Prothrombin Time: 13.4 s — ABNORMAL HIGH (ref 9.6–13.1)

## 2014-10-15 LAB — CBC WITH DIFFERENTIAL/PLATELET
Basophils Absolute: 0.2 10*3/uL — ABNORMAL HIGH (ref 0.0–0.1)
Basophils Relative: 2.7 % (ref 0.0–3.0)
EOS ABS: 0.2 10*3/uL (ref 0.0–0.7)
EOS PCT: 2.8 % (ref 0.0–5.0)
HCT: 43.5 % (ref 39.0–52.0)
HEMOGLOBIN: 14.1 g/dL (ref 13.0–17.0)
Lymphocytes Relative: 19 % (ref 12.0–46.0)
Lymphs Abs: 1.6 10*3/uL (ref 0.7–4.0)
MCHC: 32.4 g/dL (ref 30.0–36.0)
MCV: 92.2 fl (ref 78.0–100.0)
MONO ABS: 0.6 10*3/uL (ref 0.1–1.0)
Monocytes Relative: 7.3 % (ref 3.0–12.0)
NEUTROS ABS: 5.7 10*3/uL (ref 1.4–7.7)
Neutrophils Relative %: 68.2 % (ref 43.0–77.0)
Platelets: 153 10*3/uL (ref 150.0–400.0)
RBC: 4.71 Mil/uL (ref 4.22–5.81)
RDW: 14.1 % (ref 11.5–15.5)
WBC: 8.3 10*3/uL (ref 4.0–10.5)

## 2014-10-18 ENCOUNTER — Telehealth: Payer: Self-pay | Admitting: Family Medicine

## 2014-10-18 NOTE — Telephone Encounter (Signed)
Patient Information:  Caller Name: Suezanne Jacquet  Phone: [phone]  Patient: [name]  Gender: Male  DOB: Oct 24, 1941  Age: 73 Years  PCP: [name])  Office Follow Up:  Does the office need to follow up with this patient?: Yes  Instructions For The Office: Please review. No opening at [place] or locations.  RN Note:  No appt at [employer] , [employer] locations, [place] or  [place] locations.    Please contact at [facility]- [phone]. Please review  Symptoms  Reason For Call & Symptoms: Patient was hospitalized at [facility] 10/08/14. Surgery for Kidney stone and infection.  He was seen in office Monday , 10/14/14 for check up.  He does not have a catheter, he does have a stent. He is scheduled for a second surgery 10/28/14.   She reports that he is only laying around, has no energy, sleeping more, no appetite. Worsening the last two day.  Afebrile. He is urinating but is not drinking enough .  He only urinates 3-4 x daily.  He denies pain at present.  Last BM today.  Diet today- 1/2 of egg sandwhich, sprite- 1/2 cup.  Last Na -146 on monday.  Reviewed Health History In EMR: Yes  Reviewed Medications In EMR: Yes  Reviewed Allergies In EMR: Yes  Reviewed Surgeries / Procedures: Yes  Date of Onset of Symptoms: 10/16/2014  Treatments Tried: Clonazepam 1/2 tablet today Tamsulosin .  Treatments Tried Worked: No  Guideline(s) Used:  Weakness (Generalized) and Fatigue  Disposition Per Guideline:   See Today in Office  Reason For Disposition Reached:   Moderate weakness (i.e., interferes with work, school, normal activities) and persists > 3 days  Advice Given:  Fever Medicines:  For fevers above 101 F (38.3 C) take either acetaminophen or ibuprofen.  They are over-the-counter (OTC) drugs that help treat both fever and pain. You can buy them at the drugstore.  Reassurance  Not drinking enough fluids and being a little dehydrated is a common  cause of mild weakness.  Fluids  : Drink several glasses of fruit juice, other clear fluids, or water. This will improve hydration and blood glucose.  Rest  : Lie down with feet elevated for 1 hour. This will improve blood flow and increase blood flow to the brain.  Call Back If:   Still feeling weak after 2 hours of rest and fluids  You become worse.  Patient Will Follow Care Advice:  YES

## 2014-10-18 NOTE — Telephone Encounter (Signed)
This doesn't sound emergent at this point.  I question if some of his current sx are from the hydrocodone.  He can try to taper off that to see if he improves. Would inc PO fluids in the meantime. If pain, fever, SOB, lack of UOP, then to ER.  Thanks.  Routed to PCP as FYI.

## 2014-10-18 NOTE — Telephone Encounter (Signed)
Caretaker advised.

## 2014-10-19 NOTE — Telephone Encounter (Signed)
Noted. Agree.

## 2014-10-20 ENCOUNTER — Telehealth: Payer: Self-pay | Admitting: Family Medicine

## 2014-10-20 ENCOUNTER — Encounter: Payer: Self-pay | Admitting: Family Medicine

## 2014-10-20 DIAGNOSIS — N2889 Other specified disorders of kidney and ureter: Secondary | ICD-10-CM

## 2014-10-20 NOTE — Telephone Encounter (Signed)
plz notify I did find packet of info from recent hospitalization - I apologize I did not have it during office visit. It was in an unlabeled folder so I overlooked it while searching for it at visit. If they desire, may pick up packet - placed in Kim's box. Also, how did he feel over weekend with decrease in hydrocodone?

## 2014-10-21 ENCOUNTER — Ambulatory Visit: Payer: Self-pay | Admitting: Cardiology

## 2014-10-21 NOTE — Telephone Encounter (Signed)
Spoke with patient's wife and she said he did a little better, but she just believes he's depressed more than anything. She will let him know that paperwork is up front for pick up.

## 2014-10-23 ENCOUNTER — Ambulatory Visit: Payer: Self-pay | Admitting: Cardiology

## 2014-10-23 ENCOUNTER — Ambulatory Visit: Payer: Self-pay | Admitting: Urology

## 2014-10-23 LAB — BASIC METABOLIC PANEL
Anion Gap: 5 — ABNORMAL LOW (ref 7–16)
BUN: 18 mg/dL (ref 7–18)
CHLORIDE: 107 mmol/L (ref 98–107)
CO2: 28 mmol/L (ref 21–32)
Calcium, Total: 8.6 mg/dL (ref 8.5–10.1)
Creatinine: 1.3 mg/dL (ref 0.60–1.30)
EGFR (African American): >60
EGFR (Non-African Amer.): 57 — ABNORMAL LOW
GLUCOSE: 97 mg/dL (ref 65–99)
OSMOLALITY: 281 (ref 275–301)
Potassium: 4 mmol/L (ref 3.5–5.1)
SODIUM: 140 mmol/L (ref 136–145)

## 2014-10-23 LAB — CBC
HCT: 44 % (ref 40.0–52.0)
HGB: 14.4 g/dL (ref 13.0–18.0)
MCH: 30 pg (ref 26.0–34.0)
MCHC: 32.6 g/dL (ref 32.0–36.0)
MCV: 92 fL (ref 80–100)
PLATELETS: 154 10*3/uL (ref 150–440)
RBC: 4.8 10*6/uL (ref 4.40–5.90)
RDW: 13.5 % (ref 11.5–14.5)
WBC: 6.7 10*3/uL (ref 3.8–10.6)

## 2014-10-24 LAB — URINE CULTURE

## 2014-10-28 ENCOUNTER — Ambulatory Visit: Payer: Self-pay | Admitting: Urology

## 2014-11-13 ENCOUNTER — Encounter: Payer: Self-pay | Admitting: Family Medicine

## 2014-11-18 ENCOUNTER — Other Ambulatory Visit: Payer: Self-pay | Admitting: Family Medicine

## 2014-11-18 NOTE — Telephone Encounter (Signed)
Ok to refill 

## 2014-11-19 NOTE — Telephone Encounter (Signed)
Rx called in as directed.   

## 2014-11-19 NOTE — Telephone Encounter (Signed)
Plz phone in

## 2014-12-02 ENCOUNTER — Ambulatory Visit: Payer: Self-pay

## 2014-12-26 ENCOUNTER — Ambulatory Visit: Payer: Self-pay | Admitting: Urology

## 2015-01-13 ENCOUNTER — Other Ambulatory Visit: Payer: Self-pay | Admitting: *Deleted

## 2015-01-13 MED ORDER — CLONAZEPAM 1 MG PO TABS: ORAL_TABLET | ORAL | Status: DC

## 2015-01-13 NOTE — Telephone Encounter (Signed)
Printed and in Kim's box 

## 2015-01-13 NOTE — Telephone Encounter (Signed)
Ok to refill? Will need written script for mail order.

## 2015-01-15 NOTE — Telephone Encounter (Signed)
Rx faxed as directed.

## 2015-01-20 ENCOUNTER — Other Ambulatory Visit: Payer: Self-pay | Admitting: *Deleted

## 2015-01-20 MED ORDER — METOPROLOL SUCCINATE ER 25 MG PO TB24: ORAL_TABLET | ORAL | Status: DC

## 2015-01-25 ENCOUNTER — Other Ambulatory Visit: Payer: Self-pay | Admitting: Family Medicine

## 2015-01-27 NOTE — Telephone Encounter (Signed)
Spoke with patient and he didn't need Rx through local. He had already received it through mail order and wasn't going to be using [facility] any longer unless necessary.

## 2015-01-27 NOTE — Telephone Encounter (Signed)
plz phone in if not already done. 

## 2015-01-27 NOTE — Telephone Encounter (Signed)
plz check with patient. Needs to choose 1 pharmacy to send controlled substances to - but should not run out suddenly of med.

## 2015-01-27 NOTE — Telephone Encounter (Signed)
I sent this into mail order on 01/13/15. Do you want me to send it in to local as well?

## 2015-01-30 ENCOUNTER — Ambulatory Visit: Payer: Self-pay | Admitting: Urology

## 2015-03-10 ENCOUNTER — Other Ambulatory Visit: Payer: Self-pay | Admitting: *Deleted

## 2015-03-10 NOTE — Telephone Encounter (Signed)
Ok to refill 

## 2015-03-11 MED ORDER — CLONAZEPAM 1 MG PO TABS: 1.0000 mg | ORAL_TABLET | Freq: Two times a day (BID) | ORAL | Status: DC | PRN

## 2015-03-11 NOTE — Telephone Encounter (Signed)
plz phone in. 

## 2015-03-11 NOTE — Telephone Encounter (Signed)
Rx called in as directed.   

## 2015-04-05 NOTE — Op Note (Signed)
PATIENT NAMEIMARI, [name] MR#:  [id number] DATE OF BIRTH:  [birth year]  DATE OF PROCEDURE:  10/28/2014  PREOPERATIVE DIAGNOSIS: Distal left ureteral calculus.   POSTOPERATIVE DIAGNOSIS: Distal left ureteral calculus.  PROCEDURE: Cystoscopy, left ureteroscopy, laser lithotripsy of calculus, calculus removal with stent placement.  With the patient sterilely prepped and draped in supine lithotomy position after appropriate timeout, we began the procedure. The patient has constant oozing throughout this procedure. He has a very friable bladder and prostate area, so I am able to locate the left ureteral orifice and instrument it with a wire. This 0.038 Sensor wire was placed up into the kidney. Then I attempted to do a rigid ureteroscopy. I cannot find the ureteral orifice or the wire. There is just too much oozing, so I put a second wire up through a Cook double-lumen ureteral access catheter. I then put the scope up over this second wire and pulled the second wire out through the ureteroscope. With the 7-French rigid ureteroscope, I viewed the area. There is a distal stone. It is very black in color. It would be very hard to break up. I break it up with an 365-micron front-firing holmium laser. The stone breaks up easily into 2 pieces. I take the pieces out with the 4-wire Nitinol 0-tipped basket. I again have to use the wires to get back into the ureter because I cannot find the orifice or the safety wire. It  just oozes. It clots, so because of this. I put in a stent and I put in a Foley. I understand now why he had a Foley and a stent in the first time when they just did the stent for the stone. He is an oozer, a bleeding type, oozing type of bleeder, so anyway at the end of the procedure, the stent is in good position. I check it in the bladder, empty the bladder, put a Foley catheter in. I irrigated it and I will irrigate it p.r.n. for clots here in the recovery room. We will try to send him home today.  He will need his catheter removed in 48 hours, and we will probably pull the stent in a week or so.    ____________________________ Janice Coffin. [name], DO rdh:jh D: 10/28/2014 14:51:48 ET T: 10/28/2014 16:09:15 ET JOB#: [id number]  cc: Janice Coffin. [name], DO, <Dictator> Andren Bethea D Dearis Danis DO ELECTRONICALLY SIGNED 11/27/2014 13:06

## 2015-04-05 NOTE — Consult Note (Signed)
PATIENT NAMEMAZE, [name] MR#:  [id number] DATE OF BIRTH:  1941/10/18  DATE OF CONSULTATION:  10/08/2014  CONSULTING PHYSICIAN:  Douglas Xiang R. Calee Nugent, MD  CONSULT REQUESTING PHYSICIAN: Dr. Sanda Linger.   REASON FOR CONSULTATION: Hypotension.    HISTORY OF PRESENTING ILLNESS: A 74 year old Caucasian male patient with history of CAD, chronic back pain, left renal mass, and prior history of nephrolithiasis presents to the hospital with complaints of 3 days of worsening malaise, pain, nausea, vomiting, fever, and chills. The patient has also had left flank pain. The patient was found to have 5 x 8-mm distal ureteral stone with associated hydroureteronephrosis. Had a ureteral stent placed but patient intraoperatively was hypotensive into as low as 70/50, had to be given a dose of phenylephrine, after which his blood pressure has improved.   Presently, the patient complains of hematuria in his Foley and left flank pain, is spasmodic on and off.   He has not had any chest pain, shortness of breath, lightheadedness, dizziness, diarrhea, nausea, vomiting.   PAST MEDICAL HISTORY:  1. Nonobstructive CAD.  2. Hyperlipidemia.  3. Nephrolithiasis.  4. Benign prostatic hypertrophy.  5. Aortic regurgitation.  6. Left renal mass.  7. Chronic back pain.  8. Chronic kidney disease stage III.   PAST SURGICAL HISTORY: Cataracts.   FAMILY HISTORY: Mother had nephrolithiasis.   SOCIAL HISTORY: The patient does not smoke. No alcohol. No illicit drug use. He is independent with activities of daily living.   CODE STATUS: Full code.   REVIEW OF SYSTEMS:  CONSTITUTIONAL: Complains of some fatigue, weakness.  EYES: No blurred vision, pain, redness.  ENT: No drains, ear pain, hearing loss.  RESPIRATORY: No cough, wheeze, hemoptysis.  CARDIOVASCULAR: No chest pain, orthopnea, edema. GASTROINTESTINAL: No nausea, vomiting, abdominal pain.  GENITOURINARY: Has left flank pain, hematuria with Foley catheter, and  nephrolithiasis.  MUSCULOSKELETAL: No joint swelling or redness.  NEUROLOGIC: No focal numbness, weakness, seizure.  PSYCHIATRIC: No anxiety or depression.   HOME MEDICATIONS:  1. Acetaminophen hydrocodone 325/5 one tablet every 4 hours as needed.  2. Colace 100 mg oral as needed for constipation.  3. Flomax 0.4 mg oral once a day.  4. Levaquin 250 mg oral once a day.   PHYSICAL EXAMINATION:  VITAL SIGNS: Temperature 97.7, pulse 76, blood pressure of 115/67, saturating 95% on room air.  GENERAL: Moderately built Caucasian male patient lying in bed, seems comfortable, conversational, cooperative with exam.  PSYCHIATRIC: Alert and oriented x 3. Mood and affect appropriate. Judgment intact.  HEENT: Atraumatic, normocephalic. Oral mucosa moist and pink. Extraocular movements normal. No pallor. No icterus. Pupils bilaterally equal and reactive to light.  NECK: Supple. No thyromegaly. No palpable lymph nodes. Trachea midline. No carotid bruits or JVD.  CARDIOVASCULAR: S1, S2, without any murmurs. Peripheral pulses 2+. No edema.  RESPIRATORY: Normal work of breathing. Clear to auscultation on both sides.  GASTROINTESTINAL: Soft abdomen, nontender. Bowel sounds present. No organomegaly palpable.  GENITOURINARY: Has a Foley catheter with hematuria. Has left CVA tenderness.  MUSCULOSKELETAL: No joint swelling, redness, effusion of the large joints. Normal muscle tone.  NEUROLOGICAL: Motor strength 5/5 in upper extremities. Sensation is intact all over.  LYMPHATIC: No cervical lymphadenopathy.   LABORATORY STUDIES: Glucose of 120, BUN 29, creatinine 2.36, sodium 147, potassium 4.2, WBC 9.3, hemoglobin of 12.7.  Urinalysis shows no bacteria, 2 WBC.   CT scan of the abdomen and pelvis without contrast shows 5 x 8-mm stone in the distal left ureter with mild hydronephrosis, extensive bilateral  nephrolithiasis, and bilateral renal cysts. Also, a remote L1 compression fracture with advanced height loss  anteriorly.   ASSESSMENT AND PLAN:  1. Left ureteral stone status post stenting. Patient presently has hematuria which should hopefully slowly resolve. Also on antibiotics. Further management as per urology.  2. Hypotension, likely induced from drugs during surgery. The patient had transient hypotension during surgery, but no prior history of hypertension before surgery in PACU or on the floor. At this point, we will monitor. Can continue all his home medications.  3. Acute renal failure over chronic kidney disease stage III improving with IV fluids. Needs outpatient followup with primary care physician.  4. Chronic low back pain secondary to remote L1 compression fracture on CAT scan, nothing acute.  5. Left renal mass. The patient follows with urology as outpatient.   Thank you for the consult. Please call back with any questions.     ____________________________ [name], MD srs:lt D: 10/08/2014 13:11:48 ET T: 10/08/2014 22:37:45 ET JOB#: [id number]  cc: [name], MD, <Dictator> [name] ELECTRONICALLY SIGNED 10/17/2014 14:56

## 2015-04-05 NOTE — Consult Note (Signed)
Brief Consult Note: Patient was seen by consultant.   Consult note dictated.   Comments: Consulted for Intra operative hypotension. Transient. Normal BP in PACU and floor.  * Left ureteral stone s/p Stent. Further management per urology  * Transisent hypotension likely from meds during surgery Now resolved. No change to home meds.  * Hematuria  Thanks for the consult. Please page with any questions.  Electronic Signatures: Alba Destine (MD)  (Signed 27-Oct-15 10:12)  Authored: Brief Consult Note   Last Updated: 27-Oct-15 10:12 by Alba Destine (MD)

## 2015-04-05 NOTE — Op Note (Signed)
Patient: This 74 year old Male had a surgical procedure performed on 08-Oct-2014.  Post Operative Report:  Pre-Op Diagnosis left obstructing distal ureteral stone   Post-Op Diagnosis Same   Operation cystourethroscopy, left RPG, left 6x26 JJ stent placement   Anesthesia General   Specimen Type left renal pelvis culture   Findings see below   Surgeon Dr. Donne Hazel   EBL: Minimal   Complications None   Description of Procedure: Operative Findings:  1. stone visible on fluoroscopy in distal left ureter 2. left RPG revealed hydro 3. significant BPH  Procedure:  After identification and review of the patient's paperwork, he was appropriately site marked and brought to the OR for induction of GA. He was then placed in lithotomy position and prepped and draped in the usual fashion.   A rigid 21Fr cystoscope with a 30 degree lens was assembled and introduced into the bladder with ample lubrication and NS irrigation. Complete cystourethroscopy was performed. Enlarged triloboar prostatic hypertrophy present. Trebeculations present in the bladder. Both UOs seen. Enlarged median lobe present. Attention turned to the LEFT UO. Distal 5x22m stone seen on fluroscopy. This was cannulated with a sensor wire under radiographic guidance and seen traversing into the LEFT renal pelvis. An open ended catheter was then advanced to the UPJ and the lefter ureter measured 25cm. A hydronephrotic drip was noted and the urine from the left renal pelvis was sent for culture. A LEFT RPG was then performed revealing significant left hydro. A 6x26 JJ stent was then advnaced over the sensor wire with minimal resistance met. Upon deployment of the wire, good curls were seen proximally and distally. A 20Fr catheter was placed into the bladder. Patient was able to tolerate the surgery without complications.   Electronic Signatures: KLum Babe(MD)  (Signed 27-Oct-15 00:37)  Authored: Patient and Date/Time,  Operative Note   Last Updated: 27-Oct-15 00:37 by KLum Babe(MD)

## 2015-04-05 NOTE — Discharge Summary (Signed)
Dates of Admission and Diagnosis:  Date of Admission 08-Oct-2014   Date of Discharge 08-Oct-2014   Admitting Diagnosis left ureteral stone   Final Diagnosis left ureteral stone   Discharge Diagnosis 1 left ureteral stone   2 acute kidney injury   3 urinary retention   4 renal mass    Chief Complaint/History of Present Illness 74 YO Caucasian gentleman with a hx of recurrent stones. Never needed operative intervention in the past. He presents to the ED with si/sx concerning for obstructive pyelonephritis, a urologic emergency. His WBC is elevated, UA demonstrates 1+ LE, has WBC, RBCs and is nitrite negative. CT demosntrates an obstructing 5x1m distal LEFT ureteral stone with associated hydro and perinephric stranding. His SCr is 2.23 but baseline is known, it is however likely acutely elevated given obstruction.  CT was personally reviewed. Patient with findings as above as well as a LEFT midpole renal mass that the patient has known about. Cannot evaluate enhancement as no contrast on CT. This will certainly require more followup.   Allergies:  No Known Allergies:     Routine Micro:  27-Oct-15 23:56   Micro Text Report GRAM STAIN   GRAM STAIN                FEW WHITE BLOOD CELLS   GRAM STAIN                FEW RED BLOOD CELLS   GRAM STAIN                NO ORGANISMS SEEN   GRAM STAIN                Few RBCs,Few WBCs,No organisms seen   ANTIBIOTIC         Gram Stain 1 FEW WHITE BLOOD CELLS  Gram Stain 2 FEW RED BLOOD CELLS  Gram Stain 3 NO ORGANISMS SEEN  Gram Stain 5 Few RBCs,Few WBCs,No organisms seen  Result(s) reported on 08 Oct 2014 at 06:24AM.  Routine Chem:  27-Oct-15 05:12   Glucose, Serum  120  BUN  27  Creatinine (comp)  1.78  Sodium, Serum  147  Potassium, Serum 4.2  Chloride, Serum  114  CO2, Serum 26  Calcium (Total), Serum  8.0  Anion Gap 7  Osmolality (calc) 299  eGFR (African American)  48  eGFR (Non-African American)  40 (eGFR values  <651mmin/1.73 m2 may be an indication of chronic kidney disease (CKD). Calculated eGFR, using the MRDR Study equation, is useful in  patients with stable renal function. The eGFR calculation will not be reliable in acutely ill patients when serum creatinine is changing rapidly. It is not useful in patients on dialysis. The eGFR calculation may not be applicable to patients at the low and high extremes of body sizes, pregnant women, and vetetarians.)  Routine Hem:  27-Oct-15 05:12   WBC (CBC) 10.3  RBC (CBC)  4.32  Hemoglobin (CBC)  12.7  Hematocrit (CBC) 40.4  Platelet Count (CBC)  85  MCV 93  MCH 29.3  MCHC  31.4  RDW 14.0  Neutrophil % 80.8  Lymphocyte % 8.4  Monocyte % 10.2  Eosinophil % 0.3  Basophil % 0.3  Neutrophil #  8.3  Lymphocyte #  0.9  Monocyte # 1.0  Eosinophil # 0.0  Basophil # 0.0 (Result(s) reported on 08 Oct 2014 at 06:17AM.)   PERTINENT RADIOLOGY STUDIES:  LabUnknown:    26-Oct-15 21:32, CT Abdomen and Pelvis Without Contrast  PACS Image  CT:  CT Abdomen and Pelvis Without Contrast   REASON FOR EXAM:    (1) left flank pain, concern for stone; (2) left   flank pain, concern for stone  COMMENTS:       PROCEDURE: CT  - CT ABDOMEN AND PELVIS W0  - Oct 07 2014  9:32PM     CLINICAL DATA:  Left flank pain. The hematuria. Concern for kidney  stone. Initial encounter    EXAM:  CT ABDOMEN AND PELVIS WITHOUT CONTRAST    TECHNIQUE:  Multidetector CT imaging of the abdomen and pelvis was performed  following the standard protocol without IV contrast.  COMPARISON:  02/29/2012    FINDINGS:  BODY WALL: Unremarkable.    LOWER CHEST: Unremarkable.    ABDOMEN/PELVIS:    Liver: No focal abnormality.    Biliary: No evidence of biliary obstruction or stone.    Pancreas: Unremarkable.  Spleen: Unremarkable.    Adrenals: Unremarkable.    Kidneysand ureters: Mild left hydroureteronephrosis secondary to 5  x 8 mm stone in the distal third left ureter.  There is extensive  bilateral nephrolithiasis, with stone or stone conglomerate in the  interpolar right kidney measuring up to 13 mm. Exophytic lesion from  the interpolar left kidney is slightly dense compared to water, but  appeared simple and cystic on outside renal ultrasound 04/26/2014.  The cyst measures approximately 3 cm. There is a smaller cyst in the  lower pole right kidney.    Bladder: Unremarkable.  Reproductive: Enlarged prostate, deforming the bladder base.    Bowel: No obstruction. Distal colonic diverticulosis. Normal  appendix.    Retroperitoneum: No mass or adenopathy.    Peritoneum: No ascites or pneumoperitoneum.    Vascular: No acute abnormality.    OSSEOUS: No acute abnormalities. Remote L1 compression fracture with  advanced height loss anteriorly.     IMPRESSION:  1. 5 x 8 mm stone in the distal left ureter with mild  hydronephrosis.  2. Extensive bilateral nephrolithiasis.  3. Bilateral renal cysts.  4. Incidental findings are described above.      Electronically Signed    By: Jorje Guild M.D.    On: 10/07/2014 21:56         Verified By: Gilford Silvius, M.D.,   Pertinent Past History:  Pertinent Past History h/o stones   Hospital Course:  Hospital Course Patient was admitted post operatively after emergent left ureteral stent placement.  He was hypotensive briefly intraoperatively but this resolved.  Foley catheter was left in place due to acute kidney injury and concern for urinary retention.  His WBC and Cr improved prior to discharge.  His pain was well controlled and he was tolerating a diet.    He will follow up next week for voiding trial, labs, and to discuss definative managment of his kidney stone.   Condition on Discharge Good   Code Status:  Code Status Full Code   DISCHARGE INSTRUCTIONS HOME MEDS:  Medication Reconciliation: Patient's Home Medications at Discharge:     Medication Instructions   acetaminophen-hydrocodone 325 mg-5 mg oral tablet  1 tab(s) orally every 4 to 6 hours, As Needed, moderate pain (4-6/10) - for Pain , As needed, moderate pain (4-6/10)   levofloxacin 250 mg oral tablet  1 tab(s) orally once a day   flomax 0.4 mg oral capsule  1 cap(s) orally once a day   colace sodium 100 mg oral capsule  1 cap(s) orally 2 times a day, As Needed -  for Constipation    PRESCRIPTIONS: PRINTED AND PLACED ON CHART   Physician's Instructions:  Home Health? No   Treatments None    Home Oxygen? No   Diet Regular    Diet Consistency Regular Consistency    Activity Limitations As tolerated  Foley catheter care    Return to Work after follow up visit with MD    Time frame for Follow Up Appointment 1-2 weeks  Dr. Kingsley Plan, Noralee Chars Provider): [facility], [address], [place], [place], [place] [address], [place] [phone]  TIME SPENT:  Total Time: 30 minutes or less   Electronic Signatures: Sherlynn Stalls (MD)  (Signed 27-Oct-15 14:02)  Authored: ADMISSION DATE AND DIAGNOSIS, CHIEF COMPLAINT/HPI, Allergies, PERTINENT LABS, PERTINENT RADIOLOGY STUDIES, PERTINENT PAST HISTORY, HOSPITAL COURSE, DISCHARGE INSTRUCTIONS HOME MEDS, PATIENT INSTRUCTIONS, Follow Up Physician, TIME SPENT   Last Updated: 27-Oct-15 14:02 by Sherlynn Stalls (MD)

## 2015-04-05 NOTE — H&P (Signed)
Subjective/Chief Complaint LEFT obstructing ureteral stone   History of Present Illness 74 YO gentleman with likely undiagnosed medical issues presenting to the ED with 3 days of worsening malaise, pain, n/v, fevers, and chills. Has primarily LEFT flank pain. CT reveals obstructing LEFT distal ureteral stone 5x48m with associated LEFT hydroureteronephrosis. Patient hemodynamically stable in ED.   Past History Hyperlipidemia Nephrolithiasis BPH Heart murmur LEFT renal mass - reportedly followed by a Dr. DBarnie Delchronic back pain  Surgeries: Cataracts   Code Status Full Code   Past Med/Surgical Hx:  Kidney Stones:   Hyperlipidemia:   Appendectomy:   ALLERGIES:  No Known Allergies:    Medications xanax klonipin statin (unknown to patient)   Family and Social History:  Family History Other  significant for nephrolithiasis   Review of Systems:  ROS no gross hematuria   Physical Exam:  GEN well nourished, no acute distress   HEENT PERRL, hearing intact to voice   NECK supple   RESP normal resp effort   CARD regular rate   ABD positive Flank Tenderness  soft  no guarding/rebound   PSYCH alert, A+O to time, place, person, good insight   Additional Comments AFVSS in ED   Lab Results:  Routine Chem:  26-Oct-15 19:27   Glucose, Serum  120  BUN  29  Creatinine (comp)  2.36  Sodium, Serum 144  Potassium, Serum 4.0  Chloride, Serum  111  CO2, Serum 25  Calcium (Total), Serum 8.7  Anion Gap 8  Osmolality (calc) 294  eGFR (African American)  35  eGFR (Non-African American)  29 (eGFR values <669mmin/1.73 m2 may be an indication of chronic kidney disease (CKD). Calculated eGFR, using the MRDR Study equation, is useful in  patients with stable renal function. The eGFR calculation will not be reliable in acutely ill patients when serum creatinine is changing rapidly. It is not useful in patients on dialysis. The eGFR calculation may not be applicable to  patients at the low and high extremes of body sizes, pregnant women, and vetetarians.)  Routine UA:  26-Oct-15 19:27   Color (UA) Yellow  Clarity (UA) Clear  Glucose (UA) Negative  Bilirubin (UA) Negative  Ketones (UA) Trace  Specific Gravity (UA) 1.025  Blood (UA) 1+  pH (UA) 5.0  Protein (UA) Negative  Nitrite (UA) Negative  Leukocyte Esterase (UA) Negative (Result(s) reported on 07 Oct 2014 at 08:37PM.)  RBC (UA) 17 /HPF  WBC (UA) 2 /HPF  Bacteria (UA) NONE SEEN  Epithelial Cells (UA) NONE SEEN  Mucous (UA) PRESENT (Result(s) reported on 07 Oct 2014 at 08:37PM.)  Routine Hem:  26-Oct-15 19:27   WBC (CBC)  14.2  RBC (CBC) 5.03  Hemoglobin (CBC) 14.8  Hematocrit (CBC) 46.4  Platelet Count (CBC)  90  MCV 92  MCH 29.5  MCHC 32.0  RDW 14.0  Neutrophil % 84.2  Lymphocyte % 4.6  Monocyte % 10.5  Eosinophil % 0.3  Basophil % 0.4  Neutrophil #  11.9  Lymphocyte #  0.7  Monocyte #  1.5  Eosinophil # 0.0  Basophil # 0.1 (Result(s) reported on 07 Oct 2014 at 07:50PM.)   Radiology Results: CT:    26-Oct-15 21:32, CT Abdomen and Pelvis Without Contrast  CT Abdomen and Pelvis Without Contrast  REASON FOR EXAM:    (1) left flank pain, concern for stone; (2) left   flank pain, concern for stone  COMMENTS:       PROCEDURE: CT  - CT ABDOMEN AND PELVIS  W0  - Oct 07 2014  9:32PM     CLINICAL DATA:  Left flank pain. The hematuria. Concern for kidney  stone. Initial encounter    EXAM:  CT ABDOMEN AND PELVIS WITHOUT CONTRAST    TECHNIQUE:  Multidetector CT imaging of the abdomen and pelvis was performed  following the standard protocol without IV contrast.  COMPARISON:  02/29/2012    FINDINGS:  BODY WALL: Unremarkable.    LOWER CHEST: Unremarkable.    ABDOMEN/PELVIS:    Liver: No focal abnormality.    Biliary: No evidence of biliary obstruction or stone.    Pancreas: Unremarkable.  Spleen: Unremarkable.    Adrenals: Unremarkable.    Kidneysand ureters: Mild  left hydroureteronephrosis secondary to 5  x 8 mm stone in the distal third left ureter. There is extensive  bilateral nephrolithiasis, with stone or stone conglomerate in the  interpolar right kidney measuring up to 13 mm. Exophytic lesion from  the interpolar left kidney is slightly dense compared to water, but  appeared simple and cystic on outside renal ultrasound 04/26/2014.  The cyst measures approximately 3 cm. There is a smaller cyst in the  lower pole right kidney.    Bladder: Unremarkable.  Reproductive: Enlarged prostate, deforming the bladder base.    Bowel: No obstruction. Distal colonic diverticulosis. Normal  appendix.    Retroperitoneum: No mass or adenopathy.    Peritoneum: No ascites or pneumoperitoneum.    Vascular: No acute abnormality.    OSSEOUS: No acute abnormalities. Remote L1 compression fracture with  advanced height loss anteriorly.     IMPRESSION:  1. 5 x 8 mm stone in the distal left ureter with mild  hydronephrosis.  2. Extensive bilateral nephrolithiasis.  3. Bilateral renal cysts.  4. Incidental findings are described above.      Electronically Signed    By: Jorje Guild M.D.    On: 10/07/2014 21:56         Verified By: Gilford Silvius, M.D.,    Assessment/Admission Diagnosis 74 YO Caucasian gentleman with a hx of recurrent stones. Never needed operative intervention in the past. He presents to the ED with si/sx concerning for obstructive pyelonephritis, a urologic emergency. His WBC is elevated, UA demonstrates 1+ LE, has WBC, RBCs and is nitrite negative. CT demosntrates an obstructing 5x62m distal LEFT ureteral stone with associated hydro and perinephric stranding. His SCr is 2.23 but baseline is known, it is however likely acutely elevated given obstruction.  CT was personally reviewed. Patient with findings as above as well as a LEFT midpole renal mass that the patient has known about. Cannot evaluate enhancement as no contrast on  CT. This will certainly require more followup.   Plan I had an extensive discussion with Mr. YBertzand his family. Given his clinical picture, I endorsed my concern for obstructive pyelonephritis 2/2 to his stone which can lead to disseminated sepsis and can ultimately be life-threatening. In light of this, I did recommend stent placement. Would not treat stone at this time given concern for sepsis. Patient would require a 2nd surgery in the future to treat stone by [place] Mr. YAgrusaand his family concur.   I reviewed risks/benefits of proceeding with stent placement including bleeding, infection, damage to nearby structures, inability to place stent due to tight obstruction, need for PCN, need for 2nd surgery to manage stone, sepsis, critical care, and the remote risks of PE, MI, CVA, and death. Infromed consent signed, witnessed, and reviewed. Patient is  eager to move forward.  Will admit to urology and c/s medicine for assitance with possible sepsis/pyelonephritis.  Dr. Hollice Espy of [address] will assume care of the patient on 10/27 AM.   Electronic Signatures: Lum Babe (MD)  (Signed 27-Oct-15 00:50)  Authored: CHIEF COMPLAINT and HISTORY, PAST MEDICAL/SURGIAL HISTORY, ALLERGIES, HOME MEDICATIONS, OTHER MEDICATIONS, FAMILY AND SOCIAL HISTORY, REVIEW OF SYSTEMS, PHYSICAL EXAM, LABS, Radiology, ASSESSMENT AND PLAN   Last Updated: 27-Oct-15 00:50 by Lum Babe (MD)

## 2015-04-07 ENCOUNTER — Other Ambulatory Visit: Payer: Self-pay | Admitting: *Deleted

## 2015-04-07 NOTE — Telephone Encounter (Signed)
Ok to refill? Wants Rx to go to mail order.

## 2015-04-09 MED ORDER — CLONAZEPAM 1 MG PO TABS: 1.0000 mg | ORAL_TABLET | Freq: Two times a day (BID) | ORAL | Status: DC | PRN

## 2015-04-09 NOTE — Telephone Encounter (Signed)
Rx faxed to mail order 

## 2015-04-09 NOTE — Telephone Encounter (Signed)
Printed and in Kim's box 

## 2015-04-15 ENCOUNTER — Telehealth: Payer: Self-pay | Admitting: Family Medicine

## 2015-04-15 NOTE — Telephone Encounter (Signed)
Call your insurance about the shingles shot to see if it is covered or how much it would cost and where is cheaper (here or pharmacy).  If you want to receive here, call for nurse visit.

## 2015-04-15 NOTE — Telephone Encounter (Signed)
Pt wanted to know if he could get a shingles shot Please advise

## 2015-04-16 ENCOUNTER — Telehealth: Payer: Self-pay

## 2015-04-16 NOTE — Telephone Encounter (Signed)
Patient's wife was advised and will have him check and schedule nurse visit if covered.

## 2015-04-16 NOTE — Telephone Encounter (Signed)
He did not have any pain last time we discussed knee (09/2014). If having pain that is a change - would recommend coming in for office visit and will evaluate.

## 2015-04-16 NOTE — Telephone Encounter (Signed)
Pt left v/m; pt said last visit pt discussed problems with knee pain in both knees but lt  knee is worse and pt request referral to Dr Marry Guan in [place].Please advise.pt request cb.

## 2015-04-18 NOTE — Telephone Encounter (Signed)
Patient notified and will check his schedule and call back to schedule appt.

## 2015-05-29 ENCOUNTER — Ambulatory Visit (INDEPENDENT_AMBULATORY_CARE_PROVIDER_SITE_OTHER): Payer: Medicare Other | Admitting: Urology

## 2015-05-29 ENCOUNTER — Encounter: Payer: Self-pay | Admitting: Urology

## 2015-05-29 VITALS — BP 134/76 | HR 65 | Ht 69.0 in | Wt 152.1 lb

## 2015-05-29 DIAGNOSIS — N200 Calculus of kidney: Secondary | ICD-10-CM

## 2015-05-29 DIAGNOSIS — N401 Enlarged prostate with lower urinary tract symptoms: Secondary | ICD-10-CM | POA: Diagnosis not present

## 2015-05-29 DIAGNOSIS — R972 Elevated prostate specific antigen [PSA]: Secondary | ICD-10-CM

## 2015-05-29 DIAGNOSIS — N3941 Urge incontinence: Secondary | ICD-10-CM

## 2015-05-29 DIAGNOSIS — N138 Other obstructive and reflux uropathy: Secondary | ICD-10-CM

## 2015-05-29 LAB — BLADDER SCAN AMB NON-IMAGING: Scan Result: 33

## 2015-05-29 MED ORDER — TAMSULOSIN HCL 0.4 MG PO CAPS: 0.4000 mg | ORAL_CAPSULE | Freq: Every day | ORAL | Status: DC

## 2015-05-29 NOTE — Progress Notes (Signed)
05/29/2015 4:01 PM   [name] February 14, 1941 [id number]  Referring provider: [name], MD [address] [place], [place] [address]  Chief Complaint  Patient presents with  . Follow-up    HPI: 74 year old male with multiple GU issues.  He returns to the office today for routine follow up.  1) History of nephrolithiasis status post left ureteroscopy, laser lithotripsy, left ureteral stent on 10/28/2014 for an 8 mm left distal obstructing ureteral stone. He had a fairly significant non obstructing stone burden R>L. He underwent right ESWL on 12/26/14 with excellent fragmentation of his right intrapolar 13 mm conglomerate of stones. Follow up KUB showed significant decrease in stone burden on the right.   Metabolic work up ([place]) form 03/13/15 shows low volume of urine at 1.47 L and low urinary citrate.  Urinary pH 5.56.  His 24 hour urine magnesium was borderline low.  No other obvious metabolic derangements. His creatinine was 1.35.  He reports that he has changed his habits over the past 6 months to drink more water with limited [place] in his beverage.  2) History of elevated PSA, s/p negative biopsy x 3. Awaiting records after numerous requests from patient and [name] office. Most recent PSA very elevated (19) but likely secondary to recent endoscopic manipulation. Repeat PSA 15 ng/dL on 12/15, and 13.3 on 03/13/15.  Prostate enlarged on transabdominal ultrasound volume 5.1 x 5.5 x 6.6 cm.[name]  3) CKD, baseline Cr 1.78, improved most recently to 1.35.  4) BPH- Prostate enlarged on transabdominal ultrasound volume 5.1 x 5.5 x 6.6 cm with median lobe.  Today, he complains of relatively new onset of urinary urgency, frequency, and occasional episodes of urge incontinence. He reports that these episodes of incontinence occur was commonly after he voids and then proximally 30 minutes later, he'll void again a very small amount without warning. This been going on since  his stent placement more than 6 months ago. This occurs primarily when he is watching television or at nighttime while he is asleep. His very bothered by this. He also has a somewhat slow urinary stream.  He does feel that he is able to empty his bladder for the most part. He is not currently on any BPH medicines.   PMH: Past Medical History  Diagnosis Date  . Recurrent kidney stones 2014    extensive bilateral nephrolithiasis, sees urology [name]), hydrocodone prn  . Elevated PSA 2014    sees urology - normal biopsies x 3  . Depression with anxiety   . Nasal obstruction     congenital R sided  . OSA (obstructive sleep apnea)     pt denies  . Symptomatic PVCs     per prior cardiologist, on beta blocker  . BPH with urinary obstruction     sees urology Dr. [name]  . Obstructive pyelonephritis 09/2014    s/p hospitalization and treatment with levaquin  . Left kidney mass 09/2014    on CT scan thought consistent with simple cyst on prior US    Surgical History: Past Surgical History  Procedure Laterality Date  . Tonsilectomy, adenoidectomy, bilateral myringotomy and tubes      removed as a child  . Cardiac catheterization  03/2012    EF 45-50%, no LVH, + impaired LV relaxation, no PH, no valve abnormalities  . US echocardiography  03/2012    hypokinetic anterior wall, EF 45-50%, impaired relaxation pattern, mild LA dilation, mild-mod MR, mild-mod AR  . Colonoscopy  02/2008  mod diverticulosis o/w WNL (2009)  . Cataract extraction Right 01/2013  . Cystoscopy    . Total hip arthroplasty Left   . Total hip arthroplasty Right   . Cardiovascular stress test  03/2012    mild peri infarct ischemia in apical segment, decreased EF  . Cystoscopy  10/2014    s/p stone removal, with stent removal [name])    Home Medications:    Medication List       This list is accurate as of: 05/29/15  4:01 PM.  Always use your most recent med list.               clonazePAM 1 MG tablet    Commonly known as:  KLONOPIN  Take 1 tablet (1 mg total) by mouth 2 (two) times daily as needed.     Fish Oil 1000 MG Caps  Take 1 capsule by mouth 2 (two) times daily.     HYDROcodone-acetaminophen 5-325 MG per tablet  Commonly known as:  NORCO/VICODIN  Take 1 tablet by mouth every 4 (four) hours as needed for moderate pain.     metoprolol succinate 25 MG 24 hr tablet  Commonly known as:  TOPROL-XL  TAKE 1/2 TABLET BY MOUTH ONCE A DAY     tamsulosin 0.4 MG Caps capsule  Commonly known as:  FLOMAX  Take 1 capsule (0.4 mg total) by mouth daily.        Allergies: No Known Allergies  Family History: Family History  Problem Relation Age of Onset  . Diabetes Neg Hx   . Cancer Neg Hx   . CAD Neg Hx   . Stroke Neg Hx   . Hypertension Neg Hx   . Urolithiasis Daughter     Social History:  reports that he has never smoked. He has never used smokeless tobacco. He reports that he does not drink alcohol or use illicit drugs.  Physical Exam: BP 134/76 mmHg  Pulse 65  Ht 5\' 9"  (1.753 m)  Wt 152 lb 1.6 oz (68.992 kg)  BMI 22.45 kg/m2  Constitutional:  Alert and oriented, No acute distress. HEENT: [place] AT, moist mucus membranes.  Trachea midline, no masses. Cardiovascular: No clubbing, cyanosis, or edema. Respiratory: Normal respiratory effort, no increased work of breathing. GI: Abdomen is soft, nontender, nondistended, no abdominal masses GU: No CVA tenderness.  Skin: No rashes, bruises or suspicious lesions. Neurologic: Grossly intact, no focal deficits, moving all 4 extremities. Psychiatric: Normal mood and affect.  Laboratory Data: Lab Results  Component Value Date   WBC 6.7 10/23/2014   HGB 14.4 10/23/2014   HCT 44.0 10/23/2014   MCV 92 10/23/2014   PLT 154 10/23/2014    Lab Results  Component Value Date   CREATININE 1.30 10/23/2014    Lab Results  Component Value Date   PSA 14.30* 02/12/2014    Urinalysis No results found for: COLORURINE, APPEARANCEUR,  [place], [place], GLUCOSEU, HGBUR, BILIRUBINUR, KETONESUR, PROTEINUR, UROBILINOGEN, NITRITE, LEUKOCYTESUR  Pertinent Imaging: Results for orders placed or performed in visit on 05/29/15  BLADDER SCAN AMB NON-IMAGING  Result Value Ref Range   Scan Result 33      Assessment & Plan:     1. Recurrent kidney stones He occasional intermittent flank pain history of multiple stone procedures.Bruna Potter from March 2016 was reviewed. I have recommended dramatically increasing his water intake such that he is able to produce 2.5 L of urine daily. Also recommended increasing his dietary citric acid intake. We discussed the possibility of  starting potassium citrate tabs, however, the patient was not interested in starting another medication. A free peak KUB at next visit in 3 months. - Abdomen 1 view (KUB); Future  2. BPH with urinary obstruction He doe have history of enlarged prostate. He does have some mild obstructive voiding symptoms and I have recommended initiation of Flomax both for his BPH as well as his frequent stone episodes. -start Flomax 0.4 mg daily  3. Elevated PSA History of baseline elevated PSA, most recent PSA 13 which is stable over the past several years. He is status post negative biopsy 3. We have requested records from Dr. Smith Mince office many times as well as the patient and unfortunately unable to obtain these records. Recommend repeat PSA/DRE in 3 months. - PSA; Future  4. Urge incontinence I suspect the patient has a component of overactive bladder based on his symptoms. Postvoid residual today was minimal. Plan to start the patient on Vesicare 5 mg daily (2 weeks samples given today) see if this helps. He will call us if he would like [place] to fill the prescription or perhaps switch to another medication.  Discussed the common side effects of anticholinergic medications including risk of dry mouth, dry eyes, and constipation.  Follow-up in 3 months for IP assess, PVR,  PSA, DRE, and KUB.   Hollice Espy, MD  [facility] [address] [name], [place] [address] [phone]

## 2015-05-29 NOTE — Patient Instructions (Signed)
Overactive Bladder The bladder has two functions that are totally opposite of the other. One is to relax and stretch out so it can store urine (fills like a balloon), and the other is to contract and squeeze down so that it can empty the urine that it has stored. Proper functioning of the bladder is a complex mixing of these two functions. The filling and emptying of the bladder can be influenced by:  The bladder.  The spinal cord.  The brain.  The nerves going to the bladder.  Other organs that are closely related to the bladder such as prostate in males and the vagina in females. As your bladder fills with urine, nerve signals are sent from the bladder to the brain to tell you that you may need to urinate. Normal urination requires that the bladder squeeze down with sufficient strength to empty the bladder, but this also requires that the bladder squeeze down sufficiently long to finish the job. In addition the sphincter muscles, which normally keep you from leaking urine, must also relax so that the urine can pass. Coordination between the bladder muscle squeezing down and the sphincter muscles relaxing is required to make everything happen normally. With an overactive bladder sometimes the muscles of the bladder contract unexpectedly and involuntarily and this causes an urgent need to urinate. The normal response is to try to hold urine in by contracting the sphincter muscles. Sometimes the bladder contracts so strongly that the sphincter muscles cannot stop the urine from passing out and incontinence occurs. This kind of incontinence is called urge incontinence. Having an overactive bladder can be embarrassing and awkward. It can keep you from living life the way you want to. Many people think it is just something you have to put up with as you grow older or have certain health conditions. In fact, there are treatments that can help make your life easier and more pleasant. CAUSES  Many things  can cause an overactive bladder. Possibilities include:  Urinary tract infection or infection of nearby tissues such as the prostate.  Prostate enlargement.  In women, multiple pregnancies or surgery on the uterus or urethra.  Bladder stones, inflammation, or tumors.  Caffeine.  Alcohol.  Medications. For example, diuretics (drugs that help the body get rid of extra fluid) increase urine production. Some other medicines must be taken with lots of fluids.  Muscle or nerve weakness. This might be the result of a spinal cord injury, a stroke, multiple sclerosis, or Parkinson disease.  Diabetes can cause a high urine volume which fills the bladder so quickly that the normal urge to urinate is triggered very strongly. SYMPTOMS   Loss of bladder control. You feel the need to urinate and cannot make your body wait.  Sudden, strong urges to urinate.  Urinating 8 or more times a day.  Waking up to urinate two or more times a night. DIAGNOSIS  To decide if you have overactive bladder, your health care provider will probably:  Ask about symptoms you have noticed.  Ask about your overall health. This will include questions about any medications you are taking.  Do a physical examination. This will help determine if there are obvious blockages or other problems.  Order some tests. These might include:  A blood test to check for diabetes or other health issues that could be contributing to the problem.  Urine testing. This could measure the flow of urine and the pressure on the bladder.  A test of your neurological   system (the brain, spinal cord, and nerves). This is the system that senses the need to urinate. Some of these tests are called flow tests, bladder pressure tests, and electrical measurements of the sphincter muscle.  A bladder test to check whether it is emptying completely when you urinate.  Cystoscopy. This test uses a thin tube with a tiny camera on it. It offers a  look inside your urethra and bladder to see if there are problems.  Imaging tests. You might be given a contrast dye and then asked to urinate. X-rays are taken to see how your bladder is working. TREATMENT  An overactive bladder can be treated in many ways. The treatment will depend on the cause. Whether you have a mild or severe case also makes a difference. Often, treatment can be given in your health care provider's office or clinic. Be sure to discuss the different options with your caregiver. They include:  Behavioral treatments. These do not involve medication or surgery:  Bladder training. For this, you would follow a schedule to urinate at regular intervals. This helps you learn to control the urge to urinate. At first, you might be asked to wait a few minutes after feeling the urge. In time, you should be able to schedule bathroom visits an hour or more apart.  Kegel exercises. These exercises strengthen the pelvic floor muscles, which support the bladder. Toning these muscles can help control urination even if the bladder muscles are overactive. A specialist will teach you how to do these exercises correctly. They will require daily practice.  Weight loss. If you are obese or overweight, losing weight might stop your bladder from being overactive. Talk to your health care provider about how many pounds you should lose. Also ask if there is a specific program or method that would work best for you.  Diet change. This might be suggested if constipation is making your overactive bladder worse. Your health care provider or a nutritionist can explain ways to change what you eat to ease constipation. Other people might need to take in less caffeine or alcohol. Sometimes drinking fewer fluids is needed, too.  Protection. This is not an actual treatment. But, you could wear special pads to take care of any leakage while you wait for other treatments to take effect. This will help you avoid  embarrassment.  Physical treatments.  Electrical stimulation. Electrodes will send gentle pulses to the nerves or muscles that help control the bladder. The goal is to strengthen them. Sometimes this is done with the electrodes outside the body. Or, they might be placed inside the body (implanted). This treatment can take several months to have an effect.  Medications. These are usually used along with other treatments. Several medicines are available. Some are injected into the muscles involved in urination. Others come in pill form. Medications sometimes prescribed include:  Anticholinergics. These drugs block the signals that the nerves deliver to the bladder. This keeps it from releasing urine at the wrong time. Researchers think the drugs might help in other ways, too.  Imipramine. This is an antidepressant. But, it relaxes bladder muscles.  Botox. This is still experimental. Some people believe that injecting it into the bladder muscles will relax them so they work more normally. It has also been injected into the sphincter muscle when the sphincter muscle does not open properly. This is a temporary fix, however. Also, it might make matters worse, especially in older people.  Surgery.  A device might be implanted   to help manage your nerves. It works on the nerves that signal when you need to urinate.  Surgery is sometimes needed with electrical stimulation. If the electrodes are implanted, this is done through surgery.  Sometimes repairs need to be made through surgery. For example, the size of the bladder can be changed. This is usually done in severe cases only. HOME CARE INSTRUCTIONS   Take any medications your health care provider prescribed or suggested. Follow the directions carefully.  Practice any lifestyle changes that are recommended. These might include:  Drinking less fluid or drinking at different times of the day. If you need to urinate often during the night, for  example, you may need to stop drinking fluids early in the evening.  Cutting down on caffeine or alcohol. They can both make an overactive bladder worse. Caffeine is found in coffee, tea, and sodas.  Doing Kegel exercises to strengthen muscles.  Losing weight, if that is recommended.  Eating a healthy and balanced diet. This will help you avoid constipation.  Keep a journal or a log. You might be asked to record how much you drink and when, and also when you feel the need to urinate.  Learn how to care for implants or other devices, such as pessaries. SEEK MEDICAL CARE IF:   Your overactive bladder gets worse.  You feel increased pain or irritation when you urinate.  You notice blood in your urine.  You have questions about any medications or devices that your health care provider recommended.  You notice blood, pus, or swelling at the site of any test or treatment procedure.  You have an oral temperature above 102F (38.9C). SEEK IMMEDIATE MEDICAL CARE IF:  You have an oral temperature above 102F (38.9C), not controlled by medicine. Document Released: 09/25/2009 Document Revised: 04/15/2014 Document Reviewed: 09/25/2009 Exit[employer] Patient Information 2015 [employer]. This information is not intended to replace advice given to you by your health care provider. Make sure you discuss any questions you have with your health care provider.  

## 2015-05-30 ENCOUNTER — Encounter: Payer: Self-pay | Admitting: Family Medicine

## 2015-05-30 ENCOUNTER — Other Ambulatory Visit: Payer: Self-pay

## 2015-06-05 ENCOUNTER — Other Ambulatory Visit: Payer: Self-pay | Admitting: Family Medicine

## 2015-06-05 NOTE — Telephone Encounter (Addendum)
Prescription called to pharmacy.

## 2015-06-05 NOTE — Telephone Encounter (Signed)
Acute OV 10/14/14, last CPE 02/19/14.  Last refilled #60 with 1 refill 04/09/15.

## 2015-06-05 NOTE — Telephone Encounter (Signed)
plz phone in. 

## 2015-06-23 ENCOUNTER — Ambulatory Visit (INDEPENDENT_AMBULATORY_CARE_PROVIDER_SITE_OTHER): Payer: Medicare Other | Admitting: *Deleted

## 2015-06-23 DIAGNOSIS — Z23 Encounter for immunization: Secondary | ICD-10-CM | POA: Diagnosis not present

## 2015-07-08 ENCOUNTER — Telehealth: Payer: Self-pay | Admitting: Family Medicine

## 2015-07-08 DIAGNOSIS — M25569 Pain in unspecified knee: Secondary | ICD-10-CM

## 2015-07-08 NOTE — Telephone Encounter (Signed)
Pt would like to see an Orthopedic md in [facility] regarding his knee problems. He will be calling back with the name of the md he was reccommended. Pt was informed and aware that Dr. Danise Mina will not be in the office this week.  Best number to contact pt at is [phone]

## 2015-07-08 NOTE — Telephone Encounter (Signed)
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

## 2015-07-09 NOTE — Telephone Encounter (Signed)
Referral ordered.  Thanks

## 2015-07-11 NOTE — Telephone Encounter (Signed)
Already taken care of

## 2015-07-17 ENCOUNTER — Other Ambulatory Visit: Payer: Self-pay | Admitting: Family Medicine

## 2015-07-17 NOTE — Telephone Encounter (Signed)
Ok to refill to mail order? Will need written script.

## 2015-07-18 NOTE — Telephone Encounter (Signed)
Rx faxed to [facility].

## 2015-07-18 NOTE — Telephone Encounter (Signed)
Printed and in Kim's box 

## 2015-07-30 ENCOUNTER — Other Ambulatory Visit: Payer: Medicare Other

## 2015-07-30 DIAGNOSIS — R972 Elevated prostate specific antigen [PSA]: Secondary | ICD-10-CM

## 2015-07-31 LAB — PSA: Prostate Specific Ag, Serum: 15.1 ng/mL — ABNORMAL HIGH (ref 0.0–4.0)

## 2015-08-01 ENCOUNTER — Ambulatory Visit (INDEPENDENT_AMBULATORY_CARE_PROVIDER_SITE_OTHER): Payer: Medicare Other | Admitting: Family Medicine

## 2015-08-01 ENCOUNTER — Ambulatory Visit: Payer: Self-pay | Admitting: Urology

## 2015-08-01 ENCOUNTER — Encounter: Payer: Self-pay | Admitting: Family Medicine

## 2015-08-01 ENCOUNTER — Telehealth: Payer: Self-pay

## 2015-08-01 VITALS — BP 120/70 | HR 59 | Temp 97.6°F | Wt 149.2 lb

## 2015-08-01 DIAGNOSIS — J069 Acute upper respiratory infection, unspecified: Secondary | ICD-10-CM

## 2015-08-01 DIAGNOSIS — B9789 Other viral agents as the cause of diseases classified elsewhere: Principal | ICD-10-CM

## 2015-08-01 MED ORDER — GUAIFENESIN-CODEINE 100-10 MG/5ML PO SYRP: 5.0000 mL | ORAL_SOLUTION | Freq: Three times a day (TID) | ORAL | Status: DC | PRN

## 2015-08-01 NOTE — Assessment & Plan Note (Signed)
Anticipate viral given short duration. Update if not improving as expected. Treat with supportive care as per instructions, cheratussin cough syrup, tylenol as needed and fluids/rest.  Pt agrees with plan.

## 2015-08-01 NOTE — Progress Notes (Signed)
Pre visit review using our clinic review tool, if applicable. No additional management support is needed unless otherwise documented below in the visit note. 

## 2015-08-01 NOTE — Progress Notes (Signed)
BP 120/70 mmHg  Pulse 59  Temp(Src) 97.6 F (36.4 C) (Oral)  Wt 149 lb 4 oz (67.699 kg)  SpO2 98%   CC: cough  Subjective:    Patient ID: [name], male    DOB: [birth year], 74 y.o.   MRN: [id number]  HPI: [name] is a 74 y.o. male presenting on 08/01/2015 for Cough and Nasal Congestion   Woke up this morning with sweat and cough. Cough productive of clear to green mucous. Cough some better today. Some chronic congestion (R sided), PNdrainage, rhinorrhea.  Denies ear or tooth pain, headaches.  No smokers at home. No h/o asthma. No h/o significant allergic rhinitis Hasn't tried anything for this yet.  Relevant past medical, surgical, family and social history reviewed and updated as indicated. Interim medical history since our last visit reviewed. Allergies and medications reviewed and updated. Current Outpatient Prescriptions on File Prior to Visit  Medication Sig  . clonazePAM (KLONOPIN) 1 MG tablet TAKE 1 BY MOUTH TWICE DAILY AS NEEDED  . metoprolol succinate (TOPROL-XL) 25 MG 24 hr tablet TAKE 1/2 TABLET BY MOUTH ONCE A DAY  . Omega-3 Fatty Acids (FISH OIL) 1000 MG CAPS Take 1 capsule by mouth 2 (two) times daily.  . tamsulosin (FLOMAX) 0.4 MG CAPS capsule Take 1 capsule (0.4 mg total) by mouth daily.   No current facility-administered medications on file prior to visit.    Review of Systems Per HPI unless specifically indicated above     Objective:    BP 120/70 mmHg  Pulse 59  Temp(Src) 97.6 F (36.4 C) (Oral)  Wt 149 lb 4 oz (67.699 kg)  SpO2 98%  Wt Readings from Last 3 Encounters:  08/01/15 149 lb 4 oz (67.699 kg)  05/29/15 152 lb 1.6 oz (68.992 kg)  10/14/14 150 lb (68.04 kg)    Physical Exam  Constitutional: He appears well-developed and well-nourished. No distress.  HENT:  Head: Normocephalic and atraumatic.  Right Ear: Hearing, tympanic membrane, external ear and ear canal normal.  Left Ear: Hearing, tympanic membrane, external ear and ear  canal normal.  Nose: Nose normal. No mucosal edema or rhinorrhea. Right sinus exhibits no maxillary sinus tenderness and no frontal sinus tenderness. Left sinus exhibits no maxillary sinus tenderness and no frontal sinus tenderness.  Mouth/Throat: Uvula is midline, oropharynx is clear and moist and mucous membranes are normal. No oropharyngeal exudate, posterior oropharyngeal edema, posterior oropharyngeal erythema or tonsillar abscesses.  Nasal mucosal pallor Chronic nasal deviation leading to R obstruction  Eyes: Conjunctivae and EOM are normal. Pupils are equal, round, and reactive to light. No scleral icterus.  Neck: Normal range of motion. Neck supple.  Cardiovascular: Normal rate, regular rhythm, normal heart sounds and intact distal pulses.   No murmur heard. Pulmonary/Chest: Effort normal and breath sounds normal. No respiratory distress. He has no wheezes. He has no rales.  Lymphadenopathy:    He has no cervical adenopathy.  Skin: Skin is warm and dry. No rash noted.  Nursing note and vitals reviewed.  Results for orders placed or performed in visit on 07/30/15  PSA  Result Value Ref Range   Prostate Specific Ag, Serum 15.1 (H) 0.0 - 4.0 ng/mL      Assessment & Plan:   Problem List Items Addressed This Visit    Viral URI with cough - Primary    Anticipate viral given short duration. Update if not improving as expected. Treat with supportive care as per instructions, cheratussin cough syrup, tylenol as needed  and fluids/rest.  Pt agrees with plan.          Follow up plan: Return if symptoms worsen or fail to improve.

## 2015-08-01 NOTE — Patient Instructions (Signed)
I think you have viral respiratory infection Treat with tylenol, plenty of rest, codeine cough syrup, make sure to stay well hydrated. Let us know next week if not improving as expected. You should feel better into next week.

## 2015-08-01 NOTE — Telephone Encounter (Signed)
-----   Message from Roda Shutters, [place] sent at 08/01/2015 11:57 AM EDT ----- Please notify patient that his PSA result was elevated at 15.1. He has a history of elevated PSAs and negative prostate Bx x 3. Per Dr. Cherrie Gauze note on 6-16 he was to be scheduled for a three-month follow-up appointment for IP SS, PRV, DRE, PSA, and KUB. I do not see this appointment scheduled. Will you please have patient scheduled within the next few weeks for follow up with one of the physicians so that they can discuss the next step.  Thanks

## 2015-08-01 NOTE — Telephone Encounter (Signed)
Spoke with pt in reference to f/u appt. Pt did come for labs on 07/30/15. Pt stated he will return call Monday to make f/u appt. Pt needed to speak with daughter first. Nurse stated that would be ok but reinforced the need for the f/u appt. Pt voiced understanding.

## 2015-08-26 ENCOUNTER — Ambulatory Visit (INDEPENDENT_AMBULATORY_CARE_PROVIDER_SITE_OTHER): Payer: Medicare Other

## 2015-08-26 DIAGNOSIS — Z23 Encounter for immunization: Secondary | ICD-10-CM

## 2015-08-29 ENCOUNTER — Other Ambulatory Visit: Payer: Self-pay | Admitting: Family Medicine

## 2015-08-29 ENCOUNTER — Ambulatory Visit: Payer: Medicare Other

## 2015-08-29 NOTE — Telephone Encounter (Signed)
Rx called in as directed.   

## 2015-08-29 NOTE — Telephone Encounter (Signed)
Ok to refill 

## 2015-08-29 NOTE — Telephone Encounter (Signed)
plz phone in. 

## 2015-09-09 ENCOUNTER — Other Ambulatory Visit: Payer: Self-pay | Admitting: Family Medicine

## 2015-11-03 ENCOUNTER — Other Ambulatory Visit: Payer: Self-pay | Admitting: Family Medicine

## 2015-11-03 NOTE — Telephone Encounter (Signed)
plz phone in. 

## 2015-11-03 NOTE — Telephone Encounter (Signed)
Ok to refill 

## 2015-11-04 NOTE — Telephone Encounter (Signed)
Rx called in as directed.   

## 2015-12-03 ENCOUNTER — Other Ambulatory Visit: Payer: Self-pay | Admitting: Family Medicine

## 2015-12-03 NOTE — Telephone Encounter (Signed)
Ok to refill 

## 2015-12-04 MED ORDER — CLONAZEPAM 1 MG PO TABS: ORAL_TABLET | ORAL | Status: DC

## 2015-12-04 NOTE — Telephone Encounter (Signed)
Written Rx in your IN box to fax to mail order pharmacy.

## 2015-12-04 NOTE — Telephone Encounter (Signed)
plz phone in. 

## 2015-12-05 NOTE — Telephone Encounter (Signed)
signed and in Kim's box. 

## 2015-12-05 NOTE — Telephone Encounter (Signed)
Rx faxed to mail order 

## 2016-01-07 ENCOUNTER — Other Ambulatory Visit: Payer: Self-pay | Admitting: Family Medicine

## 2016-01-07 NOTE — Telephone Encounter (Signed)
Ok to refill 

## 2016-01-08 MED ORDER — CLONAZEPAM 1 MG PO TABS: ORAL_TABLET | ORAL | Status: DC

## 2016-01-08 NOTE — Telephone Encounter (Signed)
Will need to be faxed. Printed Rx in your IN box for signing. Thanks!

## 2016-01-08 NOTE — Telephone Encounter (Signed)
Signed and in Kim's box. 

## 2016-01-08 NOTE — Telephone Encounter (Signed)
plz phone in. 

## 2016-01-09 NOTE — Telephone Encounter (Signed)
Rx faxed to mail order 

## 2016-03-10 ENCOUNTER — Other Ambulatory Visit: Payer: Self-pay | Admitting: *Deleted

## 2016-05-21 ENCOUNTER — Ambulatory Visit (INDEPENDENT_AMBULATORY_CARE_PROVIDER_SITE_OTHER): Payer: Medicare Other

## 2016-05-21 VITALS — BP 112/70 | HR 57 | Temp 98.5°F | Ht 66.25 in | Wt 151.5 lb

## 2016-05-21 DIAGNOSIS — Z0000 Encounter for general adult medical examination without abnormal findings: Secondary | ICD-10-CM | POA: Diagnosis not present

## 2016-05-21 NOTE — Patient Instructions (Signed)
[name] , Thank you for taking time to come for your Medicare Wellness Visit. I appreciate your ongoing commitment to your health goals. Please review the following plan we discussed and let me know if I can assist you in the future.   These are the goals we discussed: Goals    . Increase physical activity     Starting 05/21/16, I will continue to walk for 40 min at least 4-5 days per week.        This is a list of the screening recommended for you and due dates:  Health Maintenance  Topic Date Due  . Flu Shot  07/13/2016  . Tetanus Vaccine  01/18/2021  . DTaP/Tdap/Td vaccine  Completed  . Shingles Vaccine  Completed  . Pneumonia vaccines  Completed    Preventive Care for Adults  A healthy lifestyle and preventive care can promote health and wellness. Preventive health guidelines for adults include the following key practices.  . A routine yearly physical is a good way to check with your health care provider about your health and preventive screening. It is a chance to share any concerns and updates on your health and to receive a thorough exam.  . Visit your dentist for a routine exam and preventive care every 6 months. Brush your teeth twice a day and floss once a day. Good oral hygiene prevents tooth decay and gum disease.  . The frequency of eye exams is based on your age, health, family medical history, use  of contact lenses, and other factors. Follow your health care provider's ecommendations for frequency of eye exams.  . Eat a healthy diet. Foods like vegetables, fruits, whole grains, low-fat dairy products, and lean protein foods contain the nutrients you need without too many calories. Decrease your intake of foods high in solid fats, added sugars, and salt. Eat the right amount of calories for you. Get information about a proper diet from your health care provider, if necessary.  . Regular physical exercise is one of the most important things you can do for your health.  Most adults should get at least 150 minutes of moderate-intensity exercise (any activity that increases your heart rate and causes you to sweat) each week. In addition, most adults need muscle-strengthening exercises on 2 or more days a week.  Silver Sneakers may be a benefit available to you. To determine eligibility, you may visit the website: www.silversneakers.com or contact program at 234-248-8867 Mon-Fri between 8AM-8PM.   . Maintain a healthy weight. The body mass index (BMI) is a screening tool to identify possible weight problems. It provides an estimate of body fat based on height and weight. Your health care provider can find your BMI and can help you achieve or maintain a healthy weight.   For adults 20 years and older: ? A BMI below 18.5 is considered underweight. ? A BMI of 18.5 to 24.9 is normal. ? A BMI of 25 to 29.9 is considered overweight. ? A BMI of 30 and above is considered obese.   . Maintain normal blood lipids and cholesterol levels by exercising and minimizing your intake of saturated fat. Eat a balanced diet with plenty of fruit and vegetables. Blood tests for lipids and cholesterol should begin at age 54 and be repeated every 5 years. If your lipid or cholesterol levels are high, you are over 50, or you are at high risk for heart disease, you may need your cholesterol levels checked more frequently. Ongoing high lipid and  cholesterol levels should be treated with medicines if diet and exercise are not working.  . If you smoke, find out from your health care provider how to quit. If you do not use tobacco, please do not start.  . If you choose to drink alcohol, please do not consume more than 2 drinks per day. One drink is considered to be 12 ounces (355 mL) of beer, 5 ounces (148 mL) of wine, or 1.5 ounces (44 mL) of liquor.  . If you are 87-2 years old, ask your health care provider if you should take aspirin to prevent strokes.  . Use sunscreen. Apply sunscreen  liberally and repeatedly throughout the day. You should seek shade when your shadow is shorter than you. Protect yourself by wearing long sleeves, pants, a wide-brimmed hat, and sunglasses year round, whenever you are outdoors.  . Once a month, do a whole body skin exam, using a mirror to look at the skin on your back. Tell your health care provider of new moles, moles that have irregular borders, moles that are larger than a pencil eraser, or moles that have changed in shape or color.

## 2016-05-21 NOTE — Progress Notes (Signed)
Subjective:   [name] is a 75 y.o. male who presents for Medicare Annual/Subsequent preventive examination.  Review of Systems:  N/A Cardiac Risk Factors include: advanced age (>79men, >64 women);male gender     Objective:    Vitals: BP 112/70 mmHg  Pulse 57  Temp(Src) 98.5 F (36.9 C) (Oral)  Ht 5' 6.25" (1.683 m)  Wt 151 lb 8 oz (68.72 kg)  BMI 24.26 kg/m2  SpO2 95%  Body mass index is 24.26 kg/(m^2).  Tobacco History  Smoking status  . Never Smoker   Smokeless tobacco  . Never Used     Counseling given: No   Past Medical History  Diagnosis Date  . Recurrent kidney stones 2014    extensive bilateral nephrolithiasis, sees urology [name]), hydrocodone prn  . Elevated PSA 2014    sees urology - normal biopsies x 3  . Depression with anxiety   . Nasal obstruction     congenital R sided  . OSA (obstructive sleep apnea)     pt denies  . Symptomatic PVCs     per prior cardiologist, on beta blocker  . BPH with urinary obstruction     sees urology Dr. [name]  . Obstructive pyelonephritis 09/2014    s/p hospitalization and treatment with levaquin  . Left kidney mass 09/2014    on CT scan thought consistent with simple cyst on prior US  . Urge incontinence     thought overactive bladder   Past Surgical History  Procedure Laterality Date  . Tonsilectomy, adenoidectomy, bilateral myringotomy and tubes      removed as a child  . Cardiac catheterization  03/2012    EF 45-50%, no LVH, + impaired LV relaxation, no PH, no valve abnormalities  . US echocardiography  03/2012    hypokinetic anterior wall, EF 45-50%, impaired relaxation pattern, mild LA dilation, mild-mod MR, mild-mod AR  . Colonoscopy  02/2008    mod diverticulosis o/w WNL (2009)  . Cataract extraction Right 01/2013  . Cystoscopy    . Total hip arthroplasty Left   . Total hip arthroplasty Right   . Cardiovascular stress test  03/2012    mild peri infarct ischemia in apical segment, decreased EF    . Cystoscopy  10/2014    s/p stone removal, with stent removal [name])   Family History  Problem Relation Age of Onset  . Diabetes Neg Hx   . Cancer Neg Hx   . CAD Neg Hx   . Stroke Neg Hx   . Hypertension Neg Hx   . Urolithiasis Daughter    History  Sexual Activity  . Sexual Activity: No    Outpatient Encounter Prescriptions as of 05/21/2016  Medication Sig  . clonazePAM (KLONOPIN) 1 MG tablet TAKE 1 BY MOUTH TWICE DAILY AS NEEDED  . metoprolol succinate (TOPROL-XL) 25 MG 24 hr tablet Take one-half tablet daily **MUST HAVE PHYSICAL FOR FURTHER REFILLS**  . Omega-3 Fatty Acids (FISH OIL) 1000 MG CAPS Take 1 capsule by mouth 2 (two) times daily.  . [DISCONTINUED] guaiFENesin-codeine (ROBITUSSIN AC) 100-10 MG/5ML syrup Take 5 mLs by mouth 3 (three) times daily as needed for cough.  . [DISCONTINUED] tamsulosin (FLOMAX) 0.4 MG CAPS capsule Take 1 capsule (0.4 mg total) by mouth daily.   No facility-administered encounter medications on file as of 05/21/2016.    Activities of Daily Living In your present state of health, do you have any difficulty performing the following activities: 05/21/2016  Hearing? Y  Vision? N  Difficulty concentrating or making decisions? N  Walking or climbing stairs? N  Dressing or bathing? N  Doing errands, shopping? N  Preparing Food and eating ? N  Using the Toilet? N  In the past six months, have you accidently leaked urine? Y  Do you have problems with loss of bowel control? N  Managing your Medications? N  Managing your Finances? N  Housekeeping or managing your Housekeeping? N    Patient Care Team: [name], MD as PCP - General (Family Medicine) Thelma Comp, OD as Referring Physician (Optometry)   Assessment:     Hearing Screening   125Hz  250Hz  500Hz  1000Hz  2000Hz  4000Hz  8000Hz   Right ear:   40 0 40 0   Left ear:   40 0 40 0   Vision Screening Comments: Last eye exam in 2016 at [facility]  and Dietary recommendations Current Exercise Habits: Home exercise routine, Type of exercise: walking, Time (Minutes): 40, Frequency (Times/Week): 5, Weekly Exercise (Minutes/Week): 200, Intensity: Mild, Exercise limited by: None identified  Goals    . Increase physical activity     Starting 05/21/16, I will continue to walk for 40 min at least 4-5 days per week.       Fall Risk Fall Risk  05/21/2016 02/19/2014  Falls in the past year? No No   Depression Screen PHQ 2/9 Scores 05/21/2016 02/19/2014  PHQ - 2 Score 0 0    Cognitive Testing MMSE - Mini Mental State Exam 05/21/2016  Orientation to time 5  Orientation to Place 5  Registration 3  Attention/ Calculation 0  Recall 3  Language- name 2 objects 0  Language- repeat 1  Language- follow 3 step command 3  Language- read & follow direction 0  Write a sentence 0  Copy design 0  Total score 20   PLEASE NOTE: A Mini-Cog screen was completed. Maximum score is 20. A value of 0 denotes this part of Folstein MMSE was not completed or the patient failed this part of the Mini-Cog screening.   Mini-Cog Screening Orientation to Time - Max 5 pts Orientation to Place - Max 5 pts Registration - Max 3 pts Recall - Max 3 pts Language Repeat - Max 1 pts Language Follow 3 Step Command - Max 3 pts  Immunization History  Administered Date(s) Administered  . Influenza Whole 10/13/2013  . Influenza,inj,Quad PF,36+ Mos 08/26/2015  . Pneumococcal Conjugate-13 02/19/2014  . Pneumococcal Polysaccharide-23 01/18/2011  . Tdap 01/18/2011  . Zoster 06/23/2015   Screening Tests Health Maintenance  Topic Date Due  . INFLUENZA VACCINE  07/13/2016  . TETANUS/TDAP  01/18/2021  . DTaP/Tdap/Td  Completed  . ZOSTAVAX  Completed  . PNA vac Low Risk Adult  Completed      Plan:     I have personally reviewed and addressed the Medicare Annual Wellness questionnaire and have noted the following in the patient's chart:  A. Medical and social  history B. Use of alcohol, tobacco or illicit drugs  C. Current medications and supplements D. Functional ability and status E.  Nutritional status F.  Physical activity G. Advance directives H. List of other physicians I.  Hospitalizations, surgeries, and ER visits in previous 12 months J.  [place] to include hearing, vision, cognitive, depression L. Referrals and appointments - none  In addition, I have reviewed and discussed with patient certain preventive protocols, quality metrics, and best practice recommendations. A written personalized care plan for preventive services as  well as general preventive health recommendations were provided to patient.  See attached scanned questionnaire for additional information.   Signed,   Lindell Noe, MHA, BS, LPN Health Advisor

## 2016-05-21 NOTE — Progress Notes (Signed)
Pre visit review using our clinic review tool, if applicable. No additional management support is needed unless otherwise documented below in the visit note. 

## 2016-05-21 NOTE — Progress Notes (Signed)
PCP notes:  Health maintenance: No gaps identified or addressed.  Abnormal screenings: Hearing - failed  Patient concerns: None  Nurse concerns: None  Next PCP appt: 07/20/2016 @ 1500  I reviewed health advisor's note, was available for consultation, and agree with documentation and plan.

## 2016-06-04 ENCOUNTER — Other Ambulatory Visit: Payer: Self-pay | Admitting: *Deleted

## 2016-06-04 MED ORDER — METOPROLOL SUCCINATE ER 25 MG PO TB24: ORAL_TABLET | ORAL | Status: DC

## 2016-06-04 NOTE — Telephone Encounter (Signed)
Ok to refill 

## 2016-06-06 MED ORDER — CLONAZEPAM 1 MG PO TABS: ORAL_TABLET | ORAL | Status: DC

## 2016-06-06 NOTE — Telephone Encounter (Signed)
plz phone in.  appt 8/8

## 2016-06-07 NOTE — Telephone Encounter (Signed)
Rx called in as directed.   

## 2016-07-20 ENCOUNTER — Encounter: Payer: Self-pay | Admitting: Family Medicine

## 2016-07-20 ENCOUNTER — Ambulatory Visit (INDEPENDENT_AMBULATORY_CARE_PROVIDER_SITE_OTHER): Payer: Medicare Other | Admitting: Family Medicine

## 2016-07-20 VITALS — BP 110/72 | HR 53 | Temp 98.1°F | Ht 66.0 in | Wt 156.0 lb

## 2016-07-20 DIAGNOSIS — R0989 Other specified symptoms and signs involving the circulatory and respiratory systems: Secondary | ICD-10-CM

## 2016-07-20 DIAGNOSIS — R972 Elevated prostate specific antigen [PSA]: Secondary | ICD-10-CM

## 2016-07-20 DIAGNOSIS — N200 Calculus of kidney: Secondary | ICD-10-CM

## 2016-07-20 DIAGNOSIS — F418 Other specified anxiety disorders: Secondary | ICD-10-CM

## 2016-07-20 DIAGNOSIS — N179 Acute kidney failure, unspecified: Secondary | ICD-10-CM | POA: Insufficient documentation

## 2016-07-20 DIAGNOSIS — I493 Ventricular premature depolarization: Secondary | ICD-10-CM

## 2016-07-20 DIAGNOSIS — I6529 Occlusion and stenosis of unspecified carotid artery: Secondary | ICD-10-CM | POA: Insufficient documentation

## 2016-07-20 DIAGNOSIS — Z7189 Other specified counseling: Secondary | ICD-10-CM | POA: Diagnosis not present

## 2016-07-20 DIAGNOSIS — R739 Hyperglycemia, unspecified: Secondary | ICD-10-CM

## 2016-07-20 DIAGNOSIS — Z0000 Encounter for general adult medical examination without abnormal findings: Secondary | ICD-10-CM | POA: Insufficient documentation

## 2016-07-20 DIAGNOSIS — N289 Disorder of kidney and ureter, unspecified: Secondary | ICD-10-CM | POA: Diagnosis not present

## 2016-07-20 HISTORY — DX: Occlusion and stenosis of unspecified carotid artery: I65.29

## 2016-07-20 NOTE — Assessment & Plan Note (Signed)
Intermittent ectopy heard on exam today. Continue low dose toprol XL.

## 2016-07-20 NOTE — Assessment & Plan Note (Signed)
Chronic. Family stressors contributory. rec continued taper down on klonopin use. Discussed healthier stress relieving strategies.

## 2016-07-20 NOTE — Assessment & Plan Note (Signed)
Discussed importance of good hydration status.  

## 2016-07-20 NOTE — Progress Notes (Signed)
BP 110/72 (BP Location: Left Arm, Patient Position: Sitting, Cuff Size: Normal)   Pulse (!) 53   Temp 98.1 F (36.7 C) (Oral)   Ht 5\' 6"  (1.676 m)   Wt 156 lb (70.8 kg)   SpO2 98%   BMI 25.18 kg/m    CC: CPE Subjective:    Patient ID: [name], male    DOB: [birth year], 75 y.o.   MRN: [id number]  HPI: [name] is a 75 y.o. male presenting on 07/20/2016 for Medicare Wellness (Part 2. Saw Lesia 05-21-16. Pt has a h/o kidney stones. Wonders about K.Phos Neutral)   Saw Lesia for medicare wellness visit 2 months ago. Note reviewed. Failed hearing screen. Pt endorses h/o tinnitus. Notices some trouble with hearing especially in restaurants.   H/o recurrent kidney stones.  Rare klonopin use for anxiety. Last filled #60 05/2016, before that 12/2015.   Preventative:  Colon cancer screening - mod diverticulosis o/w WNL (2009) Prostate cancer screening - followed by urology (Dr [name]) for high PSA with 3 normal biopsies.  Flu shot - yearly Pneumovax 01/2011, prevnar 2015 Tdap 01/2011  zostavax - 2016 Advanced directives: has at home. Daughter is HCPOA for now. Asked to bring [place] a copy. Seat belt use discussed Sunscreen use discussed. No changing moles on skin  Lives alone currently in apartment  1 grown daughter  Working on getting back together with wife - [name] (legally separated)  Occupation: retired, worked in Public relations account executive for 30 yrs  Edu: GED  Activity: walking some  Diet: good wtaer, fruits/vegetables daily.   Relevant past medical, surgical, family and social history reviewed and updated as indicated. Interim medical history since our last visit reviewed. Allergies and medications reviewed and updated. Current Outpatient Prescriptions on File Prior to Visit  Medication Sig  . clonazePAM (KLONOPIN) 1 MG tablet TAKE 1 BY MOUTH TWICE DAILY AS NEEDED  . metoprolol succinate (TOPROL-XL) 25 MG 24 hr tablet Take one-half tablet daily  . Omega-3 Fatty Acids (FISH OIL)  1000 MG CAPS Take 1 capsule by mouth 2 (two) times daily.   No current facility-administered medications on file prior to visit.     Review of Systems  Constitutional: Negative for activity change, appetite change, chills, fatigue, fever and unexpected weight change.  HENT: Negative for hearing loss.   Eyes: Negative for visual disturbance.  Respiratory: Negative for cough, chest tightness, shortness of breath and wheezing.   Cardiovascular: Negative for chest pain, palpitations and leg swelling.  Gastrointestinal: Negative for abdominal distention, abdominal pain, blood in stool, constipation, diarrhea, nausea and vomiting.  Genitourinary: Negative for difficulty urinating and hematuria.  Musculoskeletal: Negative for arthralgias, myalgias and neck pain.  Skin: Negative for rash.  Neurological: Negative for dizziness, seizures, syncope and headaches.  Hematological: Negative for adenopathy. Does not bruise/bleed easily.  Psychiatric/Behavioral: Negative for dysphoric mood. The patient is not nervous/anxious.    Per HPI unless specifically indicated in ROS section     Objective:    BP 110/72 (BP Location: Left Arm, Patient Position: Sitting, Cuff Size: Normal)   Pulse (!) 53   Temp 98.1 F (36.7 C) (Oral)   Ht 5\' 6"  (1.676 m)   Wt 156 lb (70.8 kg)   SpO2 98%   BMI 25.18 kg/m   Wt Readings from Last 3 Encounters:  07/20/16 156 lb (70.8 kg)  05/21/16 151 lb 8 oz (68.7 kg)  08/01/15 149 lb 4 oz (67.7 kg)    Physical Exam  Constitutional: He is  oriented to person, place, and time. He appears well-developed and well-nourished. No distress.  HENT:  Head: Normocephalic and atraumatic.  Right Ear: Hearing, tympanic membrane, external ear and ear canal normal.  Left Ear: Hearing, tympanic membrane, external ear and ear canal normal.  Nose: Nose normal.  Mouth/Throat: Uvula is midline, oropharynx is clear and moist and mucous membranes are normal. No oropharyngeal exudate,  posterior oropharyngeal edema or posterior oropharyngeal erythema.  Eyes: Conjunctivae and EOM are normal. Pupils are equal, round, and reactive to light. No scleral icterus.  Neck: Normal range of motion. Neck supple. Carotid bruit is present (R diminished carotid pulse). No thyromegaly present.  Cardiovascular: Normal rate, regular rhythm, normal heart sounds and intact distal pulses.   Occasional extrasystoles are present.  No murmur heard. Pulses:      Radial pulses are 2+ on the right side, and 2+ on the left side.  Pulmonary/Chest: Effort normal and breath sounds normal. No respiratory distress. He has no wheezes. He has no rales.  Abdominal: Soft. Bowel sounds are normal. He exhibits no distension and no mass. There is no tenderness. There is no rebound and no guarding.  Genitourinary:  Genitourinary Comments: DRE - deferred per urology  Musculoskeletal: Normal range of motion. He exhibits no edema.  Lymphadenopathy:    He has no cervical adenopathy.  Neurological: He is alert and oriented to person, place, and time.  CN grossly intact, station and gait intact  Skin: Skin is warm and dry. No rash noted.  Psychiatric: He has a normal mood and affect. His behavior is normal. Judgment and thought content normal.  Nursing note and vitals reviewed.  Results for orders placed or performed in visit on 07/30/15  PSA  Result Value Ref Range   Prostate Specific Ag, Serum 15.1 (H) 0.0 - 4.0 ng/mL      Assessment & Plan:   Problem List Items Addressed This Visit    Advanced care planning/counseling discussion    Advanced directives: has at home. Daughter is HCPOA for now. Asked to bring [place] a copy.      Depression with anxiety    Chronic. Family stressors contributory. rec continued taper down on klonopin use. Discussed healthier stress relieving strategies.      Elevated PSA    Overdue for f/u with Dr [name]. Check PSA today and encouraged he call to schedule f/u.       Relevant  Orders   PSA   Health maintenance examination - Primary    Preventative protocols reviewed and updated unless pt declined. Discussed healthy diet and lifestyle.       Hyperglycemia   Recurrent kidney stones    Discussed importance of good hydration status.       Renal insufficiency    Check renal panel today.       Relevant Orders   Lipid panel   Renal function panel   Right carotid bruit    Check renal US today.      Relevant Orders   VAS US CAROTID   Symptomatic PVCs    Intermittent ectopy heard on exam today. Continue low dose toprol XL.       Other Visit Diagnoses   None.      Follow up plan: Return in about 1 year (around 07/20/2017), or as needed, for annual exam, prior fasting for blood work.  [name], MD

## 2016-07-20 NOTE — Assessment & Plan Note (Signed)
Advanced directives: has at home. Daughter is HCPOA for now. Asked to bring [place] a copy.

## 2016-07-20 NOTE — Patient Instructions (Addendum)
Let us know if you'd like to see audiologist for hearing.  Blood work today - schedule follow up with Dr [name].  Bring me copy of your living will to update your chart.  Continue trying to taper down on klonopin benzodiazepine - work on other healthier stress relieving strategies.  We will order carotid ultrasound to evaluate neck arteries.   Health Maintenance, Male A healthy lifestyle and preventative care can promote health and wellness.  Maintain regular health, dental, and eye exams.  Eat a healthy diet. Foods like vegetables, fruits, whole grains, low-fat dairy products, and lean protein foods contain the nutrients you need and are low in calories. Decrease your intake of foods high in solid fats, added sugars, and salt. Get information about a proper diet from your health care provider, if necessary.  Regular physical exercise is one of the most important things you can do for your health. Most adults should get at least 150 minutes of moderate-intensity exercise (any activity that increases your heart rate and causes you to sweat) each week. In addition, most adults need muscle-strengthening exercises on 2 or more days a week.   Maintain a healthy weight. The body mass index (BMI) is a screening tool to identify possible weight problems. It provides an estimate of body fat based on height and weight. Your health care provider can find your BMI and can help you achieve or maintain a healthy weight. For males 20 years and older:  A BMI below 18.5 is considered underweight.  A BMI of 18.5 to 24.9 is normal.  A BMI of 25 to 29.9 is considered overweight.  A BMI of 30 and above is considered obese.  Maintain normal blood lipids and cholesterol by exercising and minimizing your intake of saturated fat. Eat a balanced diet with plenty of fruits and vegetables. Blood tests for lipids and cholesterol should begin at age 92 and be repeated every 5 years. If your lipid or cholesterol levels  are high, you are over age 12, or you are at high risk for heart disease, you may need your cholesterol levels checked more frequently.Ongoing high lipid and cholesterol levels should be treated with medicines if diet and exercise are not working.  If you smoke, find out from your health care provider how to quit. If you do not use tobacco, do not start.  Lung cancer screening is recommended for adults aged 1-80 years who are at high risk for developing lung cancer because of a history of smoking. A yearly low-dose CT scan of the lungs is recommended for people who have at least a 30-pack-year history of smoking and are current smokers or have quit within the past 15 years. A pack year of smoking is smoking an average of 1 pack of cigarettes a day for 1 year (for example, a 30-pack-year history of smoking could mean smoking 1 pack a day for 30 years or 2 packs a day for 15 years). Yearly screening should continue until the smoker has stopped smoking for at least 15 years. Yearly screening should be stopped for people who develop a health problem that would prevent them from having lung cancer treatment.  If you choose to drink alcohol, do not have more than 2 drinks per day. One drink is considered to be 12 oz (360 mL) of beer, 5 oz (150 mL) of wine, or 1.5 oz (45 mL) of liquor.  Avoid the use of street drugs. Do not share needles with anyone. Ask for help if  you need support or instructions about stopping the use of drugs.  High blood pressure causes heart disease and increases the risk of stroke. High blood pressure is more likely to develop in:  People who have blood pressure in the end of the normal range (100-139/85-89 mm Hg).  People who are overweight or obese.  People who are African American.  If you are 48-53 years of age, have your blood pressure checked every 3-5 years. If you are 13 years of age or older, have your blood pressure checked every year. You should have your blood  pressure measured twice--once when you are at a hospital or clinic, and once when you are not at a hospital or clinic. Record the average of the two measurements. To check your blood pressure when you are not at a hospital or clinic, you can use:  An automated blood pressure machine at a pharmacy.  A home blood pressure monitor.  If you are 38-3 years old, ask your health care provider if you should take aspirin to prevent heart disease.  Diabetes screening involves taking a blood sample to check your fasting blood sugar level. This should be done once every 3 years after age 74 if you are at a normal weight and without risk factors for diabetes. Testing should be considered at a younger age or be carried out more frequently if you are overweight and have at least 1 risk factor for diabetes.  Colorectal cancer can be detected and often prevented. Most routine colorectal cancer screening begins at the age of 39 and continues through age 7. However, your health care provider may recommend screening at an earlier age if you have risk factors for colon cancer. On a yearly basis, your health care provider may provide home test kits to check for hidden blood in the stool. A small camera at the end of a tube may be used to directly examine the colon (sigmoidoscopy or colonoscopy) to detect the earliest forms of colorectal cancer. Talk to your health care provider about this at age 71 when routine screening begins. A direct exam of the colon should be repeated every 5-10 years through age 24, unless early forms of precancerous polyps or small growths are found.  People who are at an increased risk for hepatitis B should be screened for this virus. You are considered at high risk for hepatitis B if:  You were born in a country where hepatitis B occurs often. Talk with your health care provider about which countries are considered high risk.  Your parents were born in a high-risk country and you have not  received a shot to protect against hepatitis B (hepatitis B vaccine).  You have HIV or AIDS.  You use needles to inject street drugs.  You live with, or have sex with, someone who has hepatitis B.  You are a man who has sex with other men (MSM).  You get hemodialysis treatment.  You take certain medicines for conditions like cancer, organ transplantation, and autoimmune conditions.  Hepatitis C blood testing is recommended for all people born from [birth year] through 1967 and any individual with known risk factors for hepatitis C.  Healthy men should no longer receive prostate-specific antigen (PSA) blood tests as part of routine cancer screening. Talk to your health care provider about prostate cancer screening.  Testicular cancer screening is not recommended for adolescents or adult males who have no symptoms. Screening includes self-exam, a health care provider exam, and other screening tests. Consult  with your health care provider about any symptoms you have or any concerns you have about testicular cancer.  Practice safe sex. Use condoms and avoid high-risk sexual practices to reduce the spread of sexually transmitted infections (STIs).  You should be screened for STIs, including gonorrhea and chlamydia if:  You are sexually active and are younger than 24 years.  You are older than 24 years, and your health care provider tells you that you are at risk for this type of infection.  Your sexual activity has changed since you were last screened, and you are at an increased risk for chlamydia or gonorrhea. Ask your health care provider if you are at risk.  If you are at risk of being infected with HIV, it is recommended that you take a prescription medicine daily to prevent HIV infection. This is called pre-exposure prophylaxis (PrEP). You are considered at risk if:  You are a man who has sex with other men (MSM).  You are a heterosexual man who is sexually active with multiple  partners.  You take drugs by injection.  You are sexually active with a partner who has HIV.  Talk with your health care provider about whether you are at high risk of being infected with HIV. If you choose to begin PrEP, you should first be tested for HIV. You should then be tested every 3 months for as long as you are taking PrEP.  Use sunscreen. Apply sunscreen liberally and repeatedly throughout the day. You should seek shade when your shadow is shorter than you. Protect yourself by wearing long sleeves, pants, a wide-brimmed hat, and sunglasses year round whenever you are outdoors.  Tell your health care provider of new moles or changes in moles, especially if there is a change in shape or color. Also, tell your health care provider if a mole is larger than the size of a pencil eraser.  A one-time screening for abdominal aortic aneurysm (AAA) and surgical repair of large AAAs by ultrasound is recommended for men aged 43-75 years who are current or former smokers.  Stay current with your vaccines (immunizations).   This information is not intended to replace advice given to you by your health care provider. Make sure you discuss any questions you have with your health care provider.   Document Released: 05/27/2008 Document Revised: 12/20/2014 Document Reviewed: 04/26/2011 Elsevier Interactive Patient Education [employer].

## 2016-07-20 NOTE — Assessment & Plan Note (Signed)
Check renal US today.

## 2016-07-20 NOTE — Assessment & Plan Note (Signed)
Preventative protocols reviewed and updated unless pt declined. Discussed healthy diet and lifestyle.  

## 2016-07-20 NOTE — Assessment & Plan Note (Signed)
Check renal panel today. 

## 2016-07-20 NOTE — Progress Notes (Signed)
Pre visit review using our clinic review tool, if applicable. No additional management support is needed unless otherwise documented below in the visit note. 

## 2016-07-20 NOTE — Assessment & Plan Note (Signed)
Overdue for f/u with Dr [name]. Check PSA today and encouraged he call to schedule f/u.

## 2016-07-21 LAB — RENAL FUNCTION PANEL
ALBUMIN: 4.1 g/dL (ref 3.5–5.2)
BUN: 20 mg/dL (ref 6–23)
CALCIUM: 9.3 mg/dL (ref 8.4–10.5)
CHLORIDE: 107 meq/L (ref 96–112)
CO2: 30 mEq/L (ref 19–32)
Creatinine, Ser: 1.13 mg/dL (ref 0.40–1.50)
GFR: 66.88 mL/min (ref >60.00)
GLUCOSE: 85 mg/dL (ref 70–99)
PHOSPHORUS: 4.3 mg/dL (ref 2.3–4.6)
POTASSIUM: 4.6 meq/L (ref 3.5–5.1)
SODIUM: 142 meq/L (ref 135–145)

## 2016-07-21 LAB — LIPID PANEL
CHOLESTEROL: 205 mg/dL — AB (ref 0–200)
HDL: 49.3 mg/dL (ref >39.00)
LDL Cholesterol: 135 mg/dL — ABNORMAL HIGH (ref 0–99)
NonHDL: 155.23
Total CHOL/HDL Ratio: 4
Triglycerides: 100 mg/dL (ref 0.0–149.0)
VLDL: 20 mg/dL (ref 0.0–40.0)

## 2016-07-21 LAB — PSA: PSA: 19.23 ng/mL — ABNORMAL HIGH (ref 0.10–4.00)

## 2016-07-22 ENCOUNTER — Other Ambulatory Visit: Payer: Medicare Other

## 2016-07-22 ENCOUNTER — Telehealth: Payer: Self-pay

## 2016-07-22 DIAGNOSIS — R972 Elevated prostate specific antigen [PSA]: Secondary | ICD-10-CM

## 2016-07-22 NOTE — Telephone Encounter (Signed)
Pt left v/m; pt was seen 07/20/16 and pt discussed a med for pt to be prescribed to help with kidney stones; pt did not keep name of med. Pt wants to know if Dr Darnell Level is going to prescribe med to [place]. Pt request cb.

## 2016-07-23 LAB — PSA: Prostate Specific Ag, Serum: 13.8 ng/mL — ABNORMAL HIGH (ref 0.0–4.0)

## 2016-07-23 NOTE — Telephone Encounter (Signed)
Patient returned Kim's call.  Please call patient back at [phone].

## 2016-07-23 NOTE — Telephone Encounter (Signed)
I don't recommend K phos neutral at this time, recommend increase water intake to prevent kidney stones.

## 2016-07-23 NOTE — Telephone Encounter (Signed)
Tried to call patient and left message for patient to call back.

## 2016-07-23 NOTE — Telephone Encounter (Signed)
Patient notified and verbalized understanding. 

## 2016-07-23 NOTE — Telephone Encounter (Signed)
Attempted to call patient. No answer, no machine. Will try again later.  

## 2016-07-24 ENCOUNTER — Encounter: Payer: Self-pay | Admitting: Family Medicine

## 2016-07-28 ENCOUNTER — Ambulatory Visit (INDEPENDENT_AMBULATORY_CARE_PROVIDER_SITE_OTHER): Payer: Medicare Other | Admitting: Urology

## 2016-07-28 ENCOUNTER — Ambulatory Visit
Admission: RE | Admit: 2016-07-28 | Discharge: 2016-07-28 | Disposition: A | Discharge status: Home / Self Care | Payer: Medicare Other | Source: Ambulatory Visit | Attending: Urology | Admitting: Urology

## 2016-07-28 ENCOUNTER — Encounter: Payer: Self-pay | Admitting: Urology

## 2016-07-28 ENCOUNTER — Other Ambulatory Visit: Payer: Self-pay | Admitting: *Deleted

## 2016-07-28 VITALS — BP 167/76 | HR 54 | Ht 67.0 in | Wt 159.1 lb

## 2016-07-28 DIAGNOSIS — N200 Calculus of kidney: Secondary | ICD-10-CM | POA: Diagnosis not present

## 2016-07-28 DIAGNOSIS — N3941 Urge incontinence: Secondary | ICD-10-CM

## 2016-07-28 DIAGNOSIS — N401 Enlarged prostate with lower urinary tract symptoms: Secondary | ICD-10-CM

## 2016-07-28 DIAGNOSIS — N400 Enlarged prostate without lower urinary tract symptoms: Secondary | ICD-10-CM | POA: Diagnosis not present

## 2016-07-28 DIAGNOSIS — R972 Elevated prostate specific antigen [PSA]: Secondary | ICD-10-CM

## 2016-07-28 DIAGNOSIS — N138 Other obstructive and reflux uropathy: Secondary | ICD-10-CM

## 2016-07-28 LAB — BLADDER SCAN AMB NON-IMAGING: Scan Result: 11

## 2016-07-28 NOTE — Telephone Encounter (Signed)
Ok to refill? Last filled 06/06/16 #[id number] 0RF

## 2016-07-28 NOTE — Progress Notes (Signed)
07/28/2016 8:03 PM   [name] 1941-04-10 [id number]  Referring provider: [name], MD [address] [place], [place] [address]  Chief Complaint  Patient presents with  . Benign Prostatic Hypertrophy    6 month follow up  . Elevated PSA    HPI: Patient is a 75 year old Caucasian male with recurrent kidney stones, BPH with LUTS, elevated PSA and urge incontinence who presents today for 3 month follow-up.  Recurrent kidney stones Patient has an occasional episode of intermittent flank pain.  He is s/p left URS/LL/ureteral stent placement with stent removal in 10/2014, right ESWL in 12/2014.  Metabolic work up indicated a low volume of urine at 1.47 L and low urinary citrate.  Urinary pH 5.56.  His 24 hour urine magnesium was borderline low.  No other obvious metabolic derangements. His creatinine was 1.35.  He reports that he has not been diligent in increasing his water intake.  KUB on 07/28/2016 noted bilateral nephrolithiasis.  I have personally reviewed the films with the patient.    BPH WITH LUTS His IPSS score today is 14, which is moderate lower urinary tract symptomatology. He is mixed with his quality life due to his urinary symptoms.  His major complaint today is nocturia x 3, urge incontinence and a weak urinary stream.  His PVR is 11 mL.  He has had these symptoms for several years.  He denies any dysuria, hematuria or suprapubic pain.  Prostate enlarged on transabdominal ultrasound volume 5.1 x 5.5 x 6.6 cm with median lobe.  He also denies any recent fevers, chills, nausea or vomiting.  He does not have a family history of PCa.      IPSS    Row Name 07/28/16 1500         International Prostate Symptom Score   How often have you had the sensation of not emptying your bladder? Less than 1 in 5     How often have you had to urinate less than every two hours? Less than half the time     How often have you found you stopped and started again several times  when you urinated? About half the time     How often have you found it difficult to postpone urination? Less than 1 in 5 times     How often have you had a weak urinary stream? About half the time     How often have you had to strain to start urination? Less than 1 in 5 times     How many times did you typically get up at night to urinate? 3 Times     Total IPSS Score 14       Quality of Life due to urinary symptoms   If you were to spend the rest of your life with your urinary condition just the way it is now how would you feel about that? Mixed        Score:  1-7 Mild 8-19 Moderate 20-35 Severe  Elevated PSA Patient has had a history of s/p negative biopsy x 3. Awaiting records after numerous requests from patient and [name] office. Most recent PSA very elevated (19) but likely secondary to recent endoscopic manipulation. Repeat PSA 15 ng/dL on 12/15, and 13.3 on 03/13/15.  Now, PSA is 19.23.  Prostate enlarged on transabdominal ultrasound volume 5.1 x 5.5 x 6.6 cm..  Urge incontinence Patient was given a trial of Vesicare in the past, but he cannot remember whether it  was effective.   PMH: Past Medical History:  Diagnosis Date  . BPH with urinary obstruction    sees urology Dr. [name]  . Depression with anxiety   . Elevated PSA 2014   sees urology - normal biopsies x 3  . HLD (hyperlipidemia)   . Left kidney mass 09/2014   on CT scan thought consistent with simple cyst on prior US  . Nasal obstruction    congenital R sided  . Obstructive pyelonephritis 09/2014   s/p hospitalization and treatment with levaquin  . OSA (obstructive sleep apnea)    pt denies  . Recurrent kidney stones 2014   extensive bilateral nephrolithiasis, sees urology [name]), hydrocodone prn  . Symptomatic PVCs    per prior cardiologist, on beta blocker  . Urge incontinence    thought overactive bladder    Surgical History: Past Surgical History:  Procedure Laterality Date  .  CARDIAC CATHETERIZATION  03/2012   EF 45-50%, no LVH, + impaired LV relaxation, no PH, no valve abnormalities  . CARDIOVASCULAR STRESS TEST  03/2012   mild peri infarct ischemia in apical segment, decreased EF  . CATARACT EXTRACTION Right 01/2013  . COLONOSCOPY  02/2008   mod diverticulosis o/w WNL (2009)  . CYSTOSCOPY    . CYSTOSCOPY  10/2014   s/p stone removal, with stent removal [name])  . TONSILECTOMY, ADENOIDECTOMY, BILATERAL MYRINGOTOMY AND TUBES     removed as a child  . TOTAL HIP ARTHROPLASTY Left   . TOTAL HIP ARTHROPLASTY Right   . US ECHOCARDIOGRAPHY  03/2012   hypokinetic anterior wall, EF 45-50%, impaired relaxation pattern, mild LA dilation, mild-mod MR, mild-mod AR    Home Medications:    Medication List       Accurate as of 07/28/16 11:59 PM. Always use your most recent med list.          clonazePAM 1 MG tablet Commonly known as:  KLONOPIN TAKE 1 BY MOUTH TWICE DAILY AS NEEDED   DOCOSAHEXAENOIC ACID PO Take by mouth.   Fish Oil 1000 MG Caps Take 1 capsule by mouth 2 (two) times daily.   metoprolol succinate 25 MG 24 hr tablet Commonly known as:  TOPROL-XL Take one-half tablet daily       Allergies: No Known Allergies  Family History: Family History  Problem Relation Age of Onset  . Urolithiasis Daughter   . Diabetes Neg Hx   . Cancer Neg Hx   . CAD Neg Hx   . Stroke Neg Hx   . Hypertension Neg Hx     Social History:  reports that he has never smoked. He has never used smokeless tobacco. He reports that he does not drink alcohol or use drugs.  ROS: UROLOGY Frequent Urination?: No Hard to postpone urination?: No Burning/pain with urination?: No Get up at night to urinate?: Yes Leakage of urine?: Yes Urine stream starts and stops?: No Trouble starting stream?: No Do you have to strain to urinate?: No Blood in urine?: No Urinary tract infection?: No Sexually transmitted disease?: No Injury to kidneys or bladder?: No Painful intercourse?:  No Weak stream?: Yes Erection problems?: No Penile pain?: No  Gastrointestinal Nausea?: No Vomiting?: No Indigestion/heartburn?: No Diarrhea?: No Constipation?: No  Constitutional Fever: No Night sweats?: No Weight loss?: No Fatigue?: No  Skin Skin rash/lesions?: No Itching?: No  Eyes Blurred vision?: No Double vision?: No  Ears/Nose/Throat Sore throat?: No Sinus problems?: No  Hematologic/Lymphatic Swollen glands?: No Easy bruising?: No  Cardiovascular Leg swelling?: No  Chest pain?: No  Respiratory Cough?: No Shortness of breath?: No  Endocrine Excessive thirst?: No  Musculoskeletal Back pain?: No Joint pain?: No  Neurological Headaches?: No Dizziness?: No  Psychologic Depression?: No Anxiety?: No  Physical Exam: BP (!) 167/76   Pulse (!) 54   Ht 5\' 7"  (1.702 m)   Wt 159 lb 1.6 oz (72.2 kg)   BMI 24.92 kg/m   Constitutional: Well nourished. Alert and oriented, No acute distress. HEENT: [place] AT, moist mucus membranes. Trachea midline, no masses. Cardiovascular: No clubbing, cyanosis, or edema. Respiratory: Normal respiratory effort, no increased work of breathing. GI: Abdomen is soft, non tender, non distended, no abdominal masses. Liver and spleen not palpable.  No hernias appreciated.  Stool sample for occult testing is not indicated.   GU: No CVA tenderness.  No bladder fullness or masses.  Patient with circumcised phallus.  Urethral meatus is patent.  No penile discharge. No penile lesions or rashes. Scrotum without lesions, cysts, rashes and/or edema.  Testicles are located scrotally bilaterally. No masses are appreciated in the testicles. Left and right epididymis are normal. Rectal: Patient with  normal sphincter tone. Anus and perineum without scarring or rashes. No rectal masses are appreciated. Prostate is approximately 60  grams, no nodules are appreciated. Seminal vesicles are normal. Skin: No rashes, bruises or suspicious  lesions. Lymph: No cervical or inguinal adenopathy. Neurologic: Grossly intact, no focal deficits, moving all 4 extremities. Psychiatric: Normal mood and affect.  Laboratory Data: Lab Results  Component Value Date   WBC 6.7 10/23/2014   HGB 14.4 10/23/2014   HCT 44.0 10/23/2014   MCV 92 10/23/2014   PLT 154 10/23/2014    Lab Results  Component Value Date   CREATININE 1.13 07/20/2016    Lab Results  Component Value Date   PSA 19.23 (H) 07/20/2016   PSA 14.30 (H) 02/12/2014     Lab Results  Component Value Date   TSH 1.49 02/12/2014       Component Value Date/Time   CHOL 205 (H) 07/20/2016 1553   HDL 49.30 07/20/2016 1553   CHOLHDL 4 07/20/2016 1553   VLDL 20.0 07/20/2016 1553   LDLCALC 135 (H) 07/20/2016 1553    Lab Results  Component Value Date   AST 22 02/12/2014   Lab Results  Component Value Date   ALT 23 02/12/2014     Pertinent Imaging: CLINICAL DATA:  Recurrent kidney stones  EXAM: ABDOMEN - 1 VIEW  COMPARISON:  01/30/2015  FINDINGS: There is normal small bowel gas pattern. Moderate colonic stool noted in right colon obscuring the right kidney. Multiple bilateral kidney stones are again noted. The largest in left kidney region measures about 5 mm. No definite ureteral calcifications are noted. Mild dextroscoliosis again noted.  IMPRESSION: Bilateral nephrolithiasis again noted. No definite ureteral calcifications are noted. Mild dextroscoliosis.   Electronically Signed   By: Lahoma Crocker M.D.   On: 07/28/2016 15:35  Assessment & Plan:    1. Recurrent nephrolithiasis  - patient with bilateral stones  - not bothersome to him at this time  - has not been increasing water intake to 2.5 L- encouraged to do so- reluctant to do so due to urge incontinence  - recent creatinine 1.13 on 07/20/2016  2. BPH with LUTS  - IPSS score is 14/3  - Continue conservative management, avoiding bladder irritants and timed voiding's  - RTC in  one months for IPSS and PVR  3. Elevated PSA  - repeat the PSA  to rule out lab error  -  obtain a free and total PSA  4. Urge incontinence  - PVR is minimal  - retry Vesicare, reviewed side effects  - RTC in 3 weeks for IPSS and PVR  Return in about 3 weeks (around 08/18/2016) for IPSS and PVR.  These notes generated with voice recognition software. I apologize for typographical errors.  [name], [facility] [address] [place], [place] [address] [phone]

## 2016-07-29 LAB — PSA, TOTAL AND FREE
PROSTATE SPECIFIC AG, SERUM: 15.2 ng/mL — AB (ref 0.0–4.0)
PSA FREE PCT: 33.4 %
PSA FREE: 5.08 ng/mL

## 2016-07-29 MED ORDER — CLONAZEPAM 1 MG PO TABS: ORAL_TABLET | ORAL | 0 refills | Status: DC

## 2016-07-29 NOTE — Telephone Encounter (Signed)
Rx called in as directed.   

## 2016-07-29 NOTE — Telephone Encounter (Signed)
plz phone in - will phone in lower # as discussed at last OV.

## 2016-07-30 ENCOUNTER — Telehealth: Payer: Self-pay

## 2016-07-30 NOTE — Telephone Encounter (Signed)
Spoke with pt in reference to PSA results. Pt voiced understanding. Pt stated that he will make lab appt at [facility] in Sept.

## 2016-07-30 NOTE — Telephone Encounter (Signed)
-----   Message from Nori Riis, [name] sent at 07/29/2016  8:34 AM EDT ----- Please notify the patient that his blood work indicates a 9% probability of having prostate cancer.  I suggest to continue monitoring the PSA.  I would like to see him in 6 months.

## 2016-08-09 ENCOUNTER — Ambulatory Visit: Payer: Medicare Other

## 2016-08-09 DIAGNOSIS — R0989 Other specified symptoms and signs involving the circulatory and respiratory systems: Secondary | ICD-10-CM

## 2016-08-14 ENCOUNTER — Encounter: Payer: Self-pay | Admitting: Family Medicine

## 2016-08-18 ENCOUNTER — Encounter: Payer: Self-pay | Admitting: *Deleted

## 2016-08-23 ENCOUNTER — Ambulatory Visit: Payer: Medicare Other | Admitting: Urology

## 2016-08-23 ENCOUNTER — Telehealth: Payer: Self-pay | Admitting: Urology

## 2016-08-23 MED ORDER — SOLIFENACIN SUCCINATE 5 MG PO TABS: 5.0000 mg | ORAL_TABLET | Freq: Every day | ORAL | 0 refills | Status: DC

## 2016-08-23 NOTE — Telephone Encounter (Signed)
He can have one month supply to get him through until he reschedules.

## 2016-08-23 NOTE — Telephone Encounter (Signed)
Patient is requesting a refill of Vesicare 5 mg. He was on schedule for today but had to cancel. Is it ok to refill this medication. The only note I see in chart is Vesicare given in the past but patient was unsure if it is effective.

## 2016-08-23 NOTE — Telephone Encounter (Signed)
30 day supply sent to pharmacy.  

## 2016-08-23 NOTE — Telephone Encounter (Signed)
Pt needs more meds called into [facility] for Vesicare 5 mg.  Please call pt, he only has 2 left.  He was scheduled to come in today, but cancelled due to his wife being sick.

## 2016-08-25 ENCOUNTER — Ambulatory Visit (INDEPENDENT_AMBULATORY_CARE_PROVIDER_SITE_OTHER): Payer: Medicare Other

## 2016-08-25 DIAGNOSIS — Z23 Encounter for immunization: Secondary | ICD-10-CM

## 2016-08-30 ENCOUNTER — Telehealth: Payer: Self-pay

## 2016-08-30 NOTE — Telephone Encounter (Signed)
[facility] called requesting a rx for vesicare 5mg  for pt. I saw in your last dictation pt was supposed to f/u in 3 weeks and did not. Please advise.   [facility]- [phone] Ref#- [id number]

## 2016-08-30 NOTE — Telephone Encounter (Signed)
Patient was suppose to follow up for a PVR after starting the Veiscare to make sure he is not going into retention.  We can go ahead and refill the Vesicare until he comes in for an appointment.

## 2016-08-31 NOTE — Telephone Encounter (Signed)
Spoke with pt in reference to needing a f/u appt. Pt stated he has an appt next week.  Spoke with [facility] and gave verbal orders pt vesicare 5mg .

## 2016-09-08 ENCOUNTER — Ambulatory Visit (INDEPENDENT_AMBULATORY_CARE_PROVIDER_SITE_OTHER): Payer: Medicare Other | Admitting: Urology

## 2016-09-08 ENCOUNTER — Encounter: Payer: Self-pay | Admitting: Urology

## 2016-09-08 VITALS — BP 165/71 | HR 57 | Ht 67.0 in | Wt 156.0 lb

## 2016-09-08 DIAGNOSIS — N3941 Urge incontinence: Secondary | ICD-10-CM

## 2016-09-08 DIAGNOSIS — N138 Other obstructive and reflux uropathy: Secondary | ICD-10-CM | POA: Diagnosis not present

## 2016-09-08 DIAGNOSIS — N401 Enlarged prostate with lower urinary tract symptoms: Secondary | ICD-10-CM

## 2016-09-08 DIAGNOSIS — N200 Calculus of kidney: Secondary | ICD-10-CM | POA: Diagnosis not present

## 2016-09-08 DIAGNOSIS — R972 Elevated prostate specific antigen [PSA]: Secondary | ICD-10-CM | POA: Diagnosis not present

## 2016-09-08 LAB — BLADDER SCAN AMB NON-IMAGING

## 2016-09-08 NOTE — Progress Notes (Signed)
09/08/2016 3:40 PM   [name] [birth year] [id number]  Referring provider: [name], MD [address] [place], [place] [address]  Chief Complaint  Patient presents with  . Benign Prostatic Hypertrophy    3 wk w/PVR    HPI: Patient is a 75 year old Caucasian male with recurrent kidney stones, BPH with LUTS, elevated PSA and urge incontinence who presents today for 3 week follow-up.  Recurrent kidney stones Patient has an occasional episode of intermittent flank pain.  He is s/p left URS/LL/ureteral stent placement with stent removal in 10/2014, right ESWL in 12/2014.  Metabolic work up indicated a low volume of urine at 1.47 L and low urinary citrate.  Urinary pH 5.56.  His 24 hour urine magnesium was borderline low.  No other obvious metabolic derangements. His creatinine was 1.35.  He reports that he has not been diligent in increasing his water intake.  KUB on 07/28/2016 noted bilateral nephrolithiasis.  I have personally reviewed the films with the patient.  He is still reluctant to increase his water intake.    BPH WITH LUTS His IPSS score today is 14, which is moderate lower urinary tract symptomatology. He is pleased with his quality life due to his urinary symptoms.  His PVR was 12 mL.  He was given a retrial of Veiscare at his last appointment for nocturia x 3, urge incontinence and a weak urinary stream.  His previous IPSS score was 14/3.  His PVR is 11 mL.  He has had these symptoms for several years.  He denies any dysuria, hematuria or suprapubic pain.  Prostate enlarged on transabdominal ultrasound volume 5.1 x 5.5 x 6.6 cm with median lobe.  He also denies any recent fevers, chills, nausea or vomiting.  He does not have a family history of PCa.      IPSS    Row Name 07/28/16 1500 09/08/16 1500       International Prostate Symptom Score   How often have you had the sensation of not emptying your bladder? Less than 1 in 5 Not at All    How often have you  had to urinate less than every two hours? Less than half the time About half the time    How often have you found you stopped and started again several times when you urinated? About half the time About half the time    How often have you found it difficult to postpone urination? Less than 1 in 5 times Less than half the time    How often have you had a weak urinary stream? About half the time About half the time    How often have you had to strain to start urination? Less than 1 in 5 times Not at All    How many times did you typically get up at night to urinate? 3 Times 3 Times    Total IPSS Score 14 14      Quality of Life due to urinary symptoms   If you were to spend the rest of your life with your urinary condition just the way it is now how would you feel about that? Mixed Pleased       Score:  1-7 Mild 8-19 Moderate 20-35 Severe  Elevated PSA Patient has had a history of s/p negative biopsy x 3. Awaiting records after numerous requests from patient and [name] office. Most recent PSA very elevated (19) but likely secondary to recent endoscopic manipulation. Repeat PSA 15 ng/dL  on 12/15, and 13.3 on 03/13/15.  Now, PSA was 19.23 on 07/20/2016.  Prostate enlarged on transabdominal ultrasound volume 5.1 x 5.5 x 6.6 cm..  A free and total PSA noted a PSA of 15.2 with a probability of 9% of having prostate cancer.  He does not want to undergo another biopsy.    Urge incontinence He has found the Vesicare effective.  He would like to continue the medication.     PMH: Past Medical History:  Diagnosis Date  . BPH with urinary obstruction    sees urology Dr. [name]  . Carotid stenosis 07/20/2016   Minimal on [place] 07/2016. Monitor clinically.  . Depression with anxiety   . Elevated PSA 2014   sees urology - normal biopsies x 3  . HLD (hyperlipidemia)   . Left kidney mass 09/2014   on CT scan thought consistent with simple cyst on prior US  . Nasal obstruction    congenital R  sided  . Obstructive pyelonephritis 09/2014   s/p hospitalization and treatment with levaquin  . OSA (obstructive sleep apnea)    pt denies  . Recurrent kidney stones 2014   extensive bilateral nephrolithiasis, sees urology [name]), hydrocodone prn  . Symptomatic PVCs    per prior cardiologist, on beta blocker  . Urge incontinence    thought overactive bladder    Surgical History: Past Surgical History:  Procedure Laterality Date  . CARDIAC CATHETERIZATION  03/2012   EF 45-50%, no LVH, + impaired LV relaxation, no PH, no valve abnormalities  . CARDIOVASCULAR STRESS TEST  03/2012   mild peri infarct ischemia in apical segment, decreased EF  . CATARACT EXTRACTION Right 01/2013  . COLONOSCOPY  02/2008   mod diverticulosis o/w WNL (2009)  . CYSTOSCOPY    . CYSTOSCOPY  10/2014   s/p stone removal, with stent removal [name])  . TONSILECTOMY, ADENOIDECTOMY, BILATERAL MYRINGOTOMY AND TUBES     removed as a child  . TOTAL HIP ARTHROPLASTY Left   . TOTAL HIP ARTHROPLASTY Right   . US ECHOCARDIOGRAPHY  03/2012   hypokinetic anterior wall, EF 45-50%, impaired relaxation pattern, mild LA dilation, mild-mod MR, mild-mod AR    Home Medications:    Medication List       Accurate as of 09/08/16  3:40 PM. Always use your most recent med list.          clonazePAM 1 MG tablet Commonly known as:  KLONOPIN TAKE 1 BY MOUTH TWICE DAILY AS NEEDED   Fish Oil 1000 MG Caps Take 1 capsule by mouth 2 (two) times daily.   metoprolol succinate 25 MG 24 hr tablet Commonly known as:  TOPROL-XL Take one-half tablet daily   solifenacin 5 MG tablet Commonly known as:  VESICARE Take 1 tablet (5 mg total) by mouth daily.       Allergies: No Known Allergies  Family History: Family History  Problem Relation Age of Onset  . Urolithiasis Daughter   . Diabetes Neg Hx   . Cancer Neg Hx   . CAD Neg Hx   . Stroke Neg Hx   . Hypertension Neg Hx     Social History:  reports that he has never  smoked. He has never used smokeless tobacco. He reports that he does not drink alcohol or use drugs.  ROS: UROLOGY Frequent Urination?: No Hard to postpone urination?: No Burning/pain with urination?: No Get up at night to urinate?: Yes Leakage of urine?: Yes Urine stream starts and stops?: Yes Trouble starting  stream?: No Do you have to strain to urinate?: No Blood in urine?: No Urinary tract infection?: No Sexually transmitted disease?: No Injury to kidneys or bladder?: No Painful intercourse?: No Weak stream?: No Erection problems?: No Penile pain?: No  Gastrointestinal Nausea?: No Vomiting?: No Indigestion/heartburn?: No Diarrhea?: No Constipation?: No  Constitutional Fever: No Night sweats?: No Weight loss?: No Fatigue?: No  Skin Skin rash/lesions?: No Itching?: No  Eyes Blurred vision?: No Double vision?: No  Ears/Nose/Throat Sore throat?: No Sinus problems?: No  Hematologic/Lymphatic Swollen glands?: No Easy bruising?: No  Cardiovascular Leg swelling?: No Chest pain?: No  Respiratory Cough?: No Shortness of breath?: No  Endocrine Excessive thirst?: No  Musculoskeletal Back pain?: No Joint pain?: No  Neurological Headaches?: No Dizziness?: No  Psychologic Depression?: No Anxiety?: No  Physical Exam: BP (!) 165/71   Pulse (!) 57   Ht 5\' 7"  (1.702 m)   Wt 156 lb (70.8 kg)   BMI 24.43 kg/m   Constitutional: Well nourished. Alert and oriented, No acute distress. HEENT: [place] AT, moist mucus membranes. Trachea midline, no masses. Cardiovascular: No clubbing, cyanosis, or edema. Respiratory: Normal respiratory effort, no increased work of breathing. Skin: No rashes, bruises or suspicious lesions. Lymph: No cervical or inguinal adenopathy. Neurologic: Grossly intact, no focal deficits, moving all 4 extremities. Psychiatric: Normal mood and affect.  Laboratory Data: Lab Results  Component Value Date   WBC 6.7 10/23/2014   HGB  14.4 10/23/2014   HCT 44.0 10/23/2014   MCV 92 10/23/2014   PLT 154 10/23/2014    Lab Results  Component Value Date   CREATININE 1.13 07/20/2016    Lab Results  Component Value Date   PSA 19.23 (H) 07/20/2016   PSA 14.30 (H) 02/12/2014     Lab Results  Component Value Date   TSH 1.49 02/12/2014       Component Value Date/Time   CHOL 205 (H) 07/20/2016 1553   HDL 49.30 07/20/2016 1553   CHOLHDL 4 07/20/2016 1553   VLDL 20.0 07/20/2016 1553   LDLCALC 135 (H) 07/20/2016 1553    Lab Results  Component Value Date   AST 22 02/12/2014   Lab Results  Component Value Date   ALT 23 02/12/2014     Pertinent Imaging: CLINICAL DATA:  Recurrent kidney stones  EXAM: ABDOMEN - 1 VIEW  COMPARISON:  01/30/2015  FINDINGS: There is normal small bowel gas pattern. Moderate colonic stool noted in right colon obscuring the right kidney. Multiple bilateral kidney stones are again noted. The largest in left kidney region measures about 5 mm. No definite ureteral calcifications are noted. Mild dextroscoliosis again noted.  IMPRESSION: Bilateral nephrolithiasis again noted. No definite ureteral calcifications are noted. Mild dextroscoliosis.   Electronically Signed   By: Lahoma Crocker M.D.   On: 07/28/2016 15:35  Assessment & Plan:    1. Recurrent nephrolithiasis  - patient with bilateral stones  - not bothersome to him at this time  - has not been increasing water intake to 2.5 L- again encouraged to do so- reluctant to do so due to urge incontinence  - recent creatinine 1.13 on 07/20/2016  2. BPH with LUTS  - IPSS score is 14/1, it is improving  - Continue conservative management, avoiding bladder irritants and timed voiding's  - RTC in 6 months for IPSS, PSA and PVR  3. Elevated PSA  - repeat the PSA in 6 months   4. Urge incontinence  - PVR is minimal  - continue Vesicare  -  RTC in 6 months for IPSS and PVR  Return in about 6 months (around  03/08/2017) for IPSS, PSA and exam.  These notes generated with voice recognition software. I apologize for typographical errors.  [name], [facility] [address] [place], [place] [address] [phone]

## 2016-12-28 ENCOUNTER — Other Ambulatory Visit: Payer: Self-pay | Admitting: Family Medicine

## 2016-12-28 NOTE — Telephone Encounter (Signed)
Ok to refill? Last filled 07/29/16 #50 0RF

## 2016-12-28 NOTE — Telephone Encounter (Signed)
Rx called in as directed.   

## 2016-12-28 NOTE — Telephone Encounter (Signed)
plz phone in. 

## 2017-01-20 DIAGNOSIS — H40013 Open angle with borderline findings, low risk, bilateral: Secondary | ICD-10-CM | POA: Diagnosis not present

## 2017-01-20 DIAGNOSIS — H02839 Dermatochalasis of unspecified eye, unspecified eyelid: Secondary | ICD-10-CM | POA: Diagnosis not present

## 2017-01-20 DIAGNOSIS — H04123 Dry eye syndrome of bilateral lacrimal glands: Secondary | ICD-10-CM | POA: Diagnosis not present

## 2017-01-20 DIAGNOSIS — H11151 Pinguecula, right eye: Secondary | ICD-10-CM | POA: Diagnosis not present

## 2017-01-21 ENCOUNTER — Telehealth: Payer: Self-pay | Admitting: Urology

## 2017-01-21 NOTE — Telephone Encounter (Signed)
Patient is needing another refill sent into [facility] and they are asking for samples until they can get the prescription. They said they will call back Monday to confirm this.  Sharyn Lull

## 2017-01-24 ENCOUNTER — Telehealth: Payer: Self-pay | Admitting: Urology

## 2017-01-24 DIAGNOSIS — N3281 Overactive bladder: Secondary | ICD-10-CM

## 2017-01-24 MED ORDER — SOLIFENACIN SUCCINATE 5 MG PO TABS: 5.0000 mg | ORAL_TABLET | Freq: Every day | ORAL | 3 refills | Status: DC

## 2017-01-24 NOTE — Telephone Encounter (Signed)
Pt needs a new prescription of Vesicare.  Please send to mail order pharmacy.  [facility] [phone]  Please advise pt  Pt would like to get more samples while he waits for rx to come in.  He has 6 pills left.

## 2017-01-24 NOTE — Telephone Encounter (Signed)
Fax# for [facility] [phone] or [phone]

## 2017-01-24 NOTE — Telephone Encounter (Signed)
Refills sent to [facility].

## 2017-02-03 DIAGNOSIS — H6980 Other specified disorders of Eustachian tube, unspecified ear: Secondary | ICD-10-CM | POA: Diagnosis not present

## 2017-02-03 DIAGNOSIS — H903 Sensorineural hearing loss, bilateral: Secondary | ICD-10-CM | POA: Diagnosis not present

## 2017-02-03 DIAGNOSIS — H9319 Tinnitus, unspecified ear: Secondary | ICD-10-CM | POA: Diagnosis not present

## 2017-02-03 DIAGNOSIS — J301 Allergic rhinitis due to pollen: Secondary | ICD-10-CM | POA: Diagnosis not present

## 2017-02-28 ENCOUNTER — Other Ambulatory Visit: Payer: Self-pay

## 2017-02-28 DIAGNOSIS — R972 Elevated prostate specific antigen [PSA]: Secondary | ICD-10-CM

## 2017-03-01 ENCOUNTER — Other Ambulatory Visit: Payer: Medicare HMO

## 2017-03-01 DIAGNOSIS — R972 Elevated prostate specific antigen [PSA]: Secondary | ICD-10-CM | POA: Diagnosis not present

## 2017-03-02 LAB — PSA: Prostate Specific Ag, Serum: 16.6 ng/mL — ABNORMAL HIGH (ref 0.0–4.0)

## 2017-03-07 NOTE — Progress Notes (Signed)
03/08/2017 1:56 PM   [name] 07-01-1941 [id number]  Referring provider: [name], MD [address] [place], [place] [address]  Chief Complaint  Patient presents with  . Urinary Incontinence    Urge  6 month follow up  . Benign Prostatic Hypertrophy    HPI: Patient is a 76 year old Caucasian male with recurrent kidney stones, BPH with LUTS, elevated PSA and urge incontinence who presents today for 6 month follow-up.  Recurrent kidney stones Patient has an occasional episode of intermittent flank pain.  He is s/p left URS/LL/ureteral stent placement with stent removal in 10/2014, right ESWL in 12/2014.  Metabolic work up indicated a low volume of urine at 1.47 L and low urinary citrate. Urinary pH 5.56.  His 24 hour urine magnesium was borderline low.  No other obvious metabolic derangements. His creatinine was 1.13.  He reports that he has not been diligent in increasing his water intake.  KUB on 07/28/2016 noted bilateral nephrolithiasis.  He is still reluctant to increase his water intake.    BPH WITH LUTS His IPSS score today is 14, which is moderate lower urinary tract symptomatology. He is mixed with his quality life due to his urinary symptoms.  His PVR was 32 mL.  He was given a retrial of Veiscare 5 mg daily and he has found this effective for his nocturia x 3, urge incontinence and a weak urinary stream.  His previous IPSS score was 14/2.  His PVR is 11 mL.  He has had these symptoms for several years.  He denies any dysuria, hematuria or suprapubic pain.  Prostate enlarged on transabdominal ultrasound volume 5.1 x 5.5 x 6.6 cm with median lobe.  He also denies any recent fevers, chills, nausea or vomiting.  He does not have a family history of PCa.     IPSS    Row Name 03/08/17 1300         International Prostate Symptom Score   How often have you had the sensation of not emptying your bladder? Not at All     How often have you had to urinate less than  every two hours? Less than 1 in 5 times     How often have you found you stopped and started again several times when you urinated? Not at All     How often have you found it difficult to postpone urination? Almost always     How often have you had a weak urinary stream? Almost always     How often have you had to strain to start urination? Not at All     How many times did you typically get up at night to urinate? 3 Times     Total IPSS Score 14       Quality of Life due to urinary symptoms   If you were to spend the rest of your life with your urinary condition just the way it is now how would you feel about that? Mixed        Score:  1-7 Mild 8-19 Moderate 20-35 Severe  Elevated PSA Patient has had a history of s/p negative biopsy x 3. Awaiting records after numerous requests from patient and [name] office. Most recent PSA very elevated (19) but likely secondary to recent endoscopic manipulation. Repeat PSA 15 ng/dL on 12/15, and 13.3 on 03/13/15.  Now, PSA was 19.23 on 07/20/2016.  Prostate enlarged on transabdominal ultrasound volume 5.1 x 5.5 x 6.6 cm.[name]  A  free and total PSA noted a PSA of 15.2 with a probability of 9% of having prostate cancer.  He does not want to undergo another biopsy.  Current PSA is 16.6 ng/mL on 03/01/2017.    Urge incontinence He has found the Vesicare effective.  He would like to continue the medication.  PVR is 32 mL.     PMH: Past Medical History:  Diagnosis Date  . BPH with urinary obstruction    sees urology Dr. [name]  . Carotid stenosis 07/20/2016   Minimal on [place] 07/2016. Monitor clinically.  . Depression with anxiety   . Elevated PSA 2014   sees urology - normal biopsies x 3  . HLD (hyperlipidemia)   . Left kidney mass 09/2014   on CT scan thought consistent with simple cyst on prior US  . Nasal obstruction    congenital R sided  . Obstructive pyelonephritis 09/2014   s/p hospitalization and treatment with levaquin  . OSA  (obstructive sleep apnea)    pt denies  . Recurrent kidney stones 2014   extensive bilateral nephrolithiasis, sees urology [name]), hydrocodone prn  . Symptomatic PVCs    per prior cardiologist, on beta blocker  . Urge incontinence    thought overactive bladder    Surgical History: Past Surgical History:  Procedure Laterality Date  . CARDIAC CATHETERIZATION  03/2012   EF 45-50%, no LVH, + impaired LV relaxation, no PH, no valve abnormalities  . CARDIOVASCULAR STRESS TEST  03/2012   mild peri infarct ischemia in apical segment, decreased EF  . CATARACT EXTRACTION Right 01/2013  . COLONOSCOPY  02/2008   mod diverticulosis o/w WNL (2009)  . CYSTOSCOPY    . CYSTOSCOPY  10/2014   s/p stone removal, with stent removal [name])  . TONSILECTOMY, ADENOIDECTOMY, BILATERAL MYRINGOTOMY AND TUBES     removed as a child  . TOTAL HIP ARTHROPLASTY Left   . TOTAL HIP ARTHROPLASTY Right   . US ECHOCARDIOGRAPHY  03/2012   hypokinetic anterior wall, EF 45-50%, impaired relaxation pattern, mild LA dilation, mild-mod MR, mild-mod AR    Home Medications:  Allergies as of 03/08/2017   No Known Allergies     Medication List       Accurate as of 03/08/17  1:56 PM. Always use your most recent med list.          clonazePAM 1 MG tablet Commonly known as:  KLONOPIN TAKE ONE TABLET BY MOUTH TWICE DAILY AS NEEDED   Fish Oil 1000 MG Caps Take 1 capsule by mouth 2 (two) times daily.   metoprolol succinate 25 MG 24 hr tablet Commonly known as:  TOPROL-XL Take one-half tablet daily   Naproxen Sodium 220 MG Caps Take by mouth.   solifenacin 5 MG tablet Commonly known as:  VESICARE Take 1 tablet (5 mg total) by mouth daily.       Allergies: No Known Allergies  Family History: Family History  Problem Relation Age of Onset  . Urolithiasis Daughter   . Kidney Stones Mother   . Diabetes Neg Hx   . Cancer Neg Hx   . CAD Neg Hx   . Stroke Neg Hx   . Hypertension Neg Hx   . Prostate cancer  Neg Hx   . Kidney cancer Neg Hx   . Bladder Cancer Neg Hx     Social History:  reports that he has never smoked. He has never used smokeless tobacco. He reports that he does not drink alcohol or use drugs.  ROS: UROLOGY Frequent Urination?: No Hard to postpone urination?: No Burning/pain with urination?: No Get up at night to urinate?: Yes Leakage of urine?: Yes Urine stream starts and stops?: No Trouble starting stream?: No Do you have to strain to urinate?: No Blood in urine?: No Urinary tract infection?: No Sexually transmitted disease?: No Injury to kidneys or bladder?: No Painful intercourse?: No Weak stream?: No Erection problems?: No Penile pain?: No  Gastrointestinal Nausea?: No Vomiting?: No Indigestion/heartburn?: No Diarrhea?: No Constipation?: No  Constitutional Fever: No Night sweats?: No Weight loss?: No Fatigue?: No  Skin Skin rash/lesions?: No Itching?: No  Eyes Blurred vision?: No Double vision?: No  Ears/Nose/Throat Sore throat?: No Sinus problems?: No  Hematologic/Lymphatic Swollen glands?: No Easy bruising?: No  Cardiovascular Leg swelling?: No Chest pain?: No  Respiratory Cough?: No Shortness of breath?: No  Endocrine Excessive thirst?: No  Musculoskeletal Back pain?: No Joint pain?: No  Neurological Headaches?: No Dizziness?: No  Psychologic Depression?: No Anxiety?: No  Physical Exam: BP 136/67   Pulse (!) 51   Ht 5\' 7"  (1.702 m)   Wt 152 lb 12.8 oz (69.3 kg)   BMI 23.93 kg/m   Constitutional: Well nourished. Alert and oriented, No acute distress. HEENT: [place] AT, moist mucus membranes. Trachea midline, no masses. Cardiovascular: No clubbing, cyanosis, or edema. Respiratory: Normal respiratory effort, no increased work of breathing. GI: Abdomen is soft, non tender, non distended, no abdominal masses. Liver and spleen not palpable.  No hernias appreciated.  Stool sample for occult testing is not indicated.     GU: No CVA tenderness.  No bladder fullness or masses.  Patient with uncircumcised phallus.  Foreskin easily retracted  Urethral meatus is patent.  No penile discharge. No penile lesions or rashes. Scrotum without lesions, cysts, rashes and/or edema.  Testicles are located scrotally bilaterally. No masses are appreciated in the testicles. Left and right epididymis are normal. Rectal: Patient with  normal sphincter tone. Anus and perineum without scarring or rashes. No rectal masses are appreciated. Prostate is approximately 70 + grams, no nodules are appreciated. Seminal vesicles are normal. Skin: No rashes, bruises or suspicious lesions. Lymph: No cervical or inguinal adenopathy. Neurologic: Grossly intact, no focal deficits, moving all 4 extremities. Psychiatric: Normal mood and affect.   Laboratory Data: Lab Results  Component Value Date   WBC 6.7 10/23/2014   HGB 14.4 10/23/2014   HCT 44.0 10/23/2014   MCV 92 10/23/2014   PLT 154 10/23/2014    Lab Results  Component Value Date   CREATININE 1.13 07/20/2016   PSA History  16.6 ng/mL on 03/01/2017   Lab Results  Component Value Date   PSA 19.23 (H) 07/20/2016   PSA 14.30 (H) 02/12/2014     Lab Results  Component Value Date   TSH 1.49 02/12/2014       Component Value Date/Time   CHOL 205 (H) 07/20/2016 1553   HDL 49.30 07/20/2016 1553   CHOLHDL 4 07/20/2016 1553   VLDL 20.0 07/20/2016 1553   LDLCALC 135 (H) 07/20/2016 1553    Lab Results  Component Value Date   AST 22 02/12/2014   Lab Results  Component Value Date   ALT 23 02/12/2014     Pertinent Imaging: CLINICAL DATA:  Recurrent kidney stones  EXAM: ABDOMEN - 1 VIEW  COMPARISON:  01/30/2015  FINDINGS: There is normal small bowel gas pattern. Moderate colonic stool noted in right colon obscuring the right kidney. Multiple bilateral kidney stones are again noted. The  largest in left kidney region measures about 5 mm. No definite ureteral  calcifications are noted. Mild dextroscoliosis again noted.  IMPRESSION: Bilateral nephrolithiasis again noted. No definite ureteral calcifications are noted. Mild dextroscoliosis.   Electronically Signed   By: Lahoma Crocker M.D.   On: 07/28/2016 15:35  Assessment & Plan:    1. Recurrent nephrolithiasis  - patient with bilateral stones on 07/2016 KUB  - not bothersome to him at this time  - has not been increasing water intake to 2.5 L- again encouraged to do so- reluctant to do so due to urge incontinence  - recent creatinine 1.13 on 07/20/2016  - RTC in 6 months for KUB  2. BPH with LUTS  - IPSS score is 14/3, it is worsening  - Continue conservative management, avoiding bladder irritants and timed voiding's  - RTC in 6 months for IPSS, PSA and PVR - if PSA is stable  3. Elevated PSA  - I explained to the patient situations that would increase the PSA, such as: a man's age,  BPH, infection, recent intercourse/ejaculation, prostate infarction, recent urethroscopic manipulation (Foley placement/cystoscopy) and prostate cancer.   -  At this time, I have advised the patient that we will repeat the PSA with free and total PSA to rule out lab error  - I discussed the AUA Guideline's (2013) for men aged 80+ years or any man with less than a 10 to 93 year life expectancy that screening is not recommended, but since he has has had three biopsies in the past with Dr. Barnie Del he would like to continue screening for prostate cancer - explained that at his age and with his co-morbid conditions, if prostate cancer is found his treatment would be palliative and not curative             - His questions where answered and he voiced his understanding - he would like to continue the screening             - He will RTC in 6 months for PSA and exam - if PSA is stable   4. Urge incontinence  - PVR is minimal  - continue Vesicare 5 mg daily  - RTC in 6 months for IPSS and PVR  Return in about 6  months (around 09/08/2017) for IPSS, PSA and exam.  These notes generated with voice recognition software. I apologize for typographical errors.  [name], [facility] [address] [place], [place] [address] [phone]

## 2017-03-08 ENCOUNTER — Ambulatory Visit: Payer: Medicare HMO | Admitting: Urology

## 2017-03-08 ENCOUNTER — Encounter: Payer: Self-pay | Admitting: Urology

## 2017-03-08 VITALS — BP 136/67 | HR 51 | Ht 67.0 in | Wt 152.8 lb

## 2017-03-08 DIAGNOSIS — N401 Enlarged prostate with lower urinary tract symptoms: Secondary | ICD-10-CM

## 2017-03-08 DIAGNOSIS — N200 Calculus of kidney: Secondary | ICD-10-CM

## 2017-03-08 DIAGNOSIS — N400 Enlarged prostate without lower urinary tract symptoms: Secondary | ICD-10-CM

## 2017-03-08 DIAGNOSIS — N138 Other obstructive and reflux uropathy: Secondary | ICD-10-CM

## 2017-03-08 DIAGNOSIS — R972 Elevated prostate specific antigen [PSA]: Secondary | ICD-10-CM

## 2017-03-08 DIAGNOSIS — N3941 Urge incontinence: Secondary | ICD-10-CM | POA: Diagnosis not present

## 2017-03-08 LAB — BLADDER SCAN AMB NON-IMAGING: Scan Result: 32

## 2017-03-09 ENCOUNTER — Telehealth: Payer: Self-pay

## 2017-03-09 DIAGNOSIS — R972 Elevated prostate specific antigen [PSA]: Secondary | ICD-10-CM

## 2017-03-09 LAB — PSA, TOTAL AND FREE
PROSTATE SPECIFIC AG, SERUM: 17.5 ng/mL — AB (ref 0.0–4.0)
PSA FREE PCT: 35.7 %
PSA, Free: 6.25 ng/mL

## 2017-03-09 NOTE — Telephone Encounter (Signed)
LMOM

## 2017-03-09 NOTE — Telephone Encounter (Signed)
Spoke with pt in reference to PSA results. Made pt aware can either monitor q8mo or have prostate MRI. Pt elected to have monitor q70mo. Lab appt made and orders placed.

## 2017-03-09 NOTE — Telephone Encounter (Signed)
-----   Message from Nori Riis, [name] sent at 03/09/2017  8:51 AM EDT ----- Please tell the patient that his PSA is still elevated.  His other blood work states that he has a 9 % probability of prostate cancer.  We could continue to monitor the PSA, but at closer intervals (every three months) or we can order a prostate MRI at this time.

## 2017-05-16 DIAGNOSIS — L578 Other skin changes due to chronic exposure to nonionizing radiation: Secondary | ICD-10-CM | POA: Diagnosis not present

## 2017-05-16 DIAGNOSIS — L570 Actinic keratosis: Secondary | ICD-10-CM | POA: Diagnosis not present

## 2017-05-16 DIAGNOSIS — L821 Other seborrheic keratosis: Secondary | ICD-10-CM | POA: Diagnosis not present

## 2017-06-09 ENCOUNTER — Other Ambulatory Visit: Payer: Medicare HMO

## 2017-06-09 DIAGNOSIS — R972 Elevated prostate specific antigen [PSA]: Secondary | ICD-10-CM | POA: Diagnosis not present

## 2017-06-09 DIAGNOSIS — N401 Enlarged prostate with lower urinary tract symptoms: Secondary | ICD-10-CM | POA: Diagnosis not present

## 2017-06-10 ENCOUNTER — Telehealth: Payer: Self-pay

## 2017-06-10 LAB — PSA: Prostate Specific Ag, Serum: 14.9 ng/mL — ABNORMAL HIGH (ref 0.0–4.0)

## 2017-06-10 NOTE — Telephone Encounter (Signed)
Done

## 2017-06-10 NOTE — Telephone Encounter (Signed)
-----   Message from Nori Riis, [name] sent at 06/10/2017  8:06 AM EDT ----- Would you please add a free and total PSA to his blood work?

## 2017-06-13 ENCOUNTER — Telehealth: Payer: Self-pay

## 2017-06-13 NOTE — Telephone Encounter (Signed)
Called patient. Gave lab results. Patient verbalized understanding.  

## 2017-06-13 NOTE — Telephone Encounter (Signed)
-----   Message from Nori Riis, [name] sent at 06/12/2017  9:26 PM EDT ----- Please let patient know that his PSA is stable.  We will see him in September.

## 2017-06-14 LAB — PSA, TOTAL AND FREE
PSA, Free Pct: 35.5 %
PSA, Free: 5.26 ng/mL
Prostate Specific Ag, Serum: 14.8 ng/mL — ABNORMAL HIGH (ref 0.0–4.0)

## 2017-06-14 LAB — SPECIMEN STATUS REPORT

## 2017-06-15 ENCOUNTER — Other Ambulatory Visit: Payer: Self-pay | Admitting: Family Medicine

## 2017-06-29 DIAGNOSIS — R234 Changes in skin texture: Secondary | ICD-10-CM | POA: Diagnosis not present

## 2017-06-29 DIAGNOSIS — D044 Carcinoma in situ of skin of scalp and neck: Secondary | ICD-10-CM | POA: Diagnosis not present

## 2017-06-29 DIAGNOSIS — L578 Other skin changes due to chronic exposure to nonionizing radiation: Secondary | ICD-10-CM | POA: Diagnosis not present

## 2017-06-29 DIAGNOSIS — L570 Actinic keratosis: Secondary | ICD-10-CM | POA: Diagnosis not present

## 2017-06-29 DIAGNOSIS — D485 Neoplasm of uncertain behavior of skin: Secondary | ICD-10-CM | POA: Diagnosis not present

## 2017-06-29 DIAGNOSIS — C4492 Squamous cell carcinoma of skin, unspecified: Secondary | ICD-10-CM

## 2017-06-29 HISTORY — DX: Squamous cell carcinoma of skin, unspecified: C44.92

## 2017-07-14 ENCOUNTER — Encounter: Payer: Self-pay | Admitting: Family Medicine

## 2017-07-14 ENCOUNTER — Ambulatory Visit (INDEPENDENT_AMBULATORY_CARE_PROVIDER_SITE_OTHER): Payer: Medicare HMO | Admitting: Family Medicine

## 2017-07-14 VITALS — BP 134/76 | HR 54 | Temp 97.5°F | Ht 67.0 in | Wt 151.8 lb

## 2017-07-14 DIAGNOSIS — S2341XA Sprain of ribs, initial encounter: Secondary | ICD-10-CM | POA: Insufficient documentation

## 2017-07-14 NOTE — Patient Instructions (Signed)
I do think you had a ribcage strain.  Treat with tylenol 500mg  may take up to three times daily with meals. Continue heating pad no more than 10-15 min at a time.  Carry pillow with you  Should heal over next 4-6 weeks, let us know if worsening.   Chest Wall Pain Chest wall pain is pain in or around the bones and muscles of your chest. Sometimes, an injury causes this pain. Sometimes, the cause may not be known. This pain may take several weeks or longer to get better. Follow these instructions at home: Pay attention to any changes in your symptoms. Take these actions to help with your pain:  Rest as told by your health care provider.  Avoid activities that cause pain. These include any activities that use your chest muscles or your abdominal and side muscles to lift heavy items.  If directed, apply ice to the painful area: ? Put ice in a plastic bag. ? Place a towel between your skin and the bag. ? Leave the ice on for 20 minutes, 2-3 times per day.  Take over-the-counter and prescription medicines only as told by your health care provider.  Do not use tobacco products, including cigarettes, chewing tobacco, and e-cigarettes. If you need help quitting, ask your health care provider.  Keep all follow-up visits as told by your health care provider. This is important.  Contact a health care provider if:  You have a fever.  Your chest pain becomes worse.  You have new symptoms. Get help right away if:  You have nausea or vomiting.  You feel sweaty or light-headed.  You have a cough with phlegm (sputum) or you cough up blood.  You develop shortness of breath. This information is not intended to replace advice given to you by your health care provider. Make sure you discuss any questions you have with your health care provider. Document Released: 11/29/2005 Document Revised: 04/08/2016 Document Reviewed: 02/24/2015 Elsevier Interactive Patient Education  2017 [employer].

## 2017-07-14 NOTE — Assessment & Plan Note (Signed)
Story and exam consistent with ribcage sprain. Reassured patient. Supportive care reviewed. rec tylenol scheduled as well as discussed appropriate heating pad use. He and daughter decline stronger pain medication or muscle relaxant at this time.

## 2017-07-14 NOTE — Progress Notes (Signed)
   BP 134/76 (BP Location: Left Arm, Patient Position: Sitting, Cuff Size: Normal)   Pulse (!) 54   Temp (!) 97.5 F (36.4 C) (Oral)   Ht 5\' 7"  (1.702 m)   Wt 151 lb 12.8 oz (68.9 kg)   SpO2 97%   BMI 23.78 kg/m    CC: back pain Subjective:    Patient ID: [name], male    DOB: 1941-01-28, 76 y.o.   MRN: [id number]  HPI: [name] is a 76 y.o. male presenting on 07/14/2017 for Back Pain (x7 days, pain started after reaching across from driver seat to pasenger seat to close door, heating pad provided temporary relief)   Here with daughter [name] today.  1wk h/o R lower back pain that started after reaching across passenger seat to close door. Hit console on R side of body. He felt immediate pop and pain. Treating with heating pad, tylenol with temporary relief. Pain reproducible to palpation of posterior ribcage on right. Worse with rolling in bed.   Denies dyspnea.  No midline back pain, no shooting pain down legs.   Relevant past medical, surgical, family and social history reviewed and updated as indicated. Interim medical history since our last visit reviewed. Allergies and medications reviewed and updated. Outpatient Medications Prior to Visit  Medication Sig Dispense Refill  . clonazePAM (KLONOPIN) 1 MG tablet TAKE ONE TABLET BY MOUTH TWICE DAILY AS NEEDED 50 tablet 0  . metoprolol succinate (TOPROL-XL) 25 MG 24 hr tablet TAKE 1/2 TABLET BY MOUTH EVERY DAY. 45 tablet 0  . Naproxen Sodium 220 MG CAPS Take by mouth.    . Omega-3 Fatty Acids (FISH OIL) 1000 MG CAPS Take 1 capsule by mouth 2 (two) times daily.    . solifenacin (VESICARE) 5 MG tablet Take 1 tablet (5 mg total) by mouth daily. 30 tablet 3   No facility-administered medications prior to visit.      Per HPI unless specifically indicated in ROS section below Review of Systems     Objective:    BP 134/76 (BP Location: Left Arm, Patient Position: Sitting, Cuff Size: Normal)   Pulse (!) 54   Temp (!) 97.5 F  (36.4 C) (Oral)   Ht 5\' 7"  (1.702 m)   Wt 151 lb 12.8 oz (68.9 kg)   SpO2 97%   BMI 23.78 kg/m   Wt Readings from Last 3 Encounters:  07/14/17 151 lb 12.8 oz (68.9 kg)  03/08/17 152 lb 12.8 oz (69.3 kg)  09/08/16 156 lb (70.8 kg)    Physical Exam  Constitutional: He appears well-developed and well-nourished. No distress.  Pulmonary/Chest: Effort normal and breath sounds normal. No respiratory distress. He has no decreased breath sounds. He has no wheezes. He has no rales.     He exhibits tenderness.  Point tender to palpation at posterior right lower ribcage intercostals of floating ribs Lungs clear  Nursing note and vitals reviewed.     Assessment & Plan:   Problem List Items Addressed This Visit    Sprained rib, initial encounter - Primary    Story and exam consistent with ribcage sprain. Reassured patient. Supportive care reviewed. rec tylenol scheduled as well as discussed appropriate heating pad use. He and daughter decline stronger pain medication or muscle relaxant at this time.           Follow up plan: Return if symptoms worsen or fail to improve.  [name], MD

## 2017-07-27 ENCOUNTER — Other Ambulatory Visit: Payer: Self-pay | Admitting: Family Medicine

## 2017-07-27 DIAGNOSIS — H52203 Unspecified astigmatism, bilateral: Secondary | ICD-10-CM | POA: Diagnosis not present

## 2017-07-27 DIAGNOSIS — R739 Hyperglycemia, unspecified: Secondary | ICD-10-CM

## 2017-07-27 DIAGNOSIS — H5203 Hypermetropia, bilateral: Secondary | ICD-10-CM | POA: Diagnosis not present

## 2017-07-27 DIAGNOSIS — N289 Disorder of kidney and ureter, unspecified: Secondary | ICD-10-CM

## 2017-07-27 DIAGNOSIS — Z961 Presence of intraocular lens: Secondary | ICD-10-CM | POA: Diagnosis not present

## 2017-07-27 DIAGNOSIS — H524 Presbyopia: Secondary | ICD-10-CM | POA: Diagnosis not present

## 2017-07-27 DIAGNOSIS — H40013 Open angle with borderline findings, low risk, bilateral: Secondary | ICD-10-CM | POA: Diagnosis not present

## 2017-07-29 ENCOUNTER — Other Ambulatory Visit (INDEPENDENT_AMBULATORY_CARE_PROVIDER_SITE_OTHER): Payer: Medicare HMO

## 2017-07-29 DIAGNOSIS — R739 Hyperglycemia, unspecified: Secondary | ICD-10-CM

## 2017-07-29 DIAGNOSIS — N289 Disorder of kidney and ureter, unspecified: Secondary | ICD-10-CM

## 2017-07-29 LAB — COMPREHENSIVE METABOLIC PANEL
ALK PHOS: 79 U/L (ref 39–117)
ALT: 14 U/L (ref 0–53)
AST: 19 U/L (ref 0–37)
Albumin: 3.9 g/dL (ref 3.5–5.2)
BILIRUBIN TOTAL: 1 mg/dL (ref 0.2–1.2)
BUN: 18 mg/dL (ref 6–23)
CALCIUM: 9.1 mg/dL (ref 8.4–10.5)
CO2: 30 meq/L (ref 19–32)
CREATININE: 1.34 mg/dL (ref 0.40–1.50)
Chloride: 107 mEq/L (ref 96–112)
GFR: 54.79 mL/min — ABNORMAL LOW (ref >60.00)
GLUCOSE: 96 mg/dL (ref 70–99)
Potassium: 4.8 mEq/L (ref 3.5–5.1)
Sodium: 143 mEq/L (ref 135–145)
TOTAL PROTEIN: 6.5 g/dL (ref 6.0–8.3)

## 2017-07-29 LAB — LIPID PANEL
CHOL/HDL RATIO: 4
Cholesterol: 175 mg/dL (ref 0–200)
HDL: 47.4 mg/dL (ref >39.00)
LDL Cholesterol: 110 mg/dL — ABNORMAL HIGH (ref 0–99)
NONHDL: 127.44
Triglycerides: 87 mg/dL (ref 0.0–149.0)
VLDL: 17.4 mg/dL (ref 0.0–40.0)

## 2017-07-29 LAB — HEMOGLOBIN A1C: Hgb A1c MFr Bld: 5.4 % (ref 4.6–6.5)

## 2017-08-01 DIAGNOSIS — L820 Inflamed seborrheic keratosis: Secondary | ICD-10-CM | POA: Diagnosis not present

## 2017-08-01 DIAGNOSIS — D044 Carcinoma in situ of skin of scalp and neck: Secondary | ICD-10-CM | POA: Diagnosis not present

## 2017-08-01 DIAGNOSIS — L570 Actinic keratosis: Secondary | ICD-10-CM | POA: Diagnosis not present

## 2017-08-01 DIAGNOSIS — D229 Melanocytic nevi, unspecified: Secondary | ICD-10-CM | POA: Diagnosis not present

## 2017-08-01 DIAGNOSIS — L812 Freckles: Secondary | ICD-10-CM | POA: Diagnosis not present

## 2017-08-01 DIAGNOSIS — L821 Other seborrheic keratosis: Secondary | ICD-10-CM | POA: Diagnosis not present

## 2017-08-05 ENCOUNTER — Ambulatory Visit (INDEPENDENT_AMBULATORY_CARE_PROVIDER_SITE_OTHER): Payer: Medicare HMO | Admitting: Family Medicine

## 2017-08-05 ENCOUNTER — Encounter: Payer: Self-pay | Admitting: Family Medicine

## 2017-08-05 VITALS — BP 128/60 | HR 56 | Temp 97.8°F | Ht 66.0 in | Wt 150.2 lb

## 2017-08-05 DIAGNOSIS — Z0000 Encounter for general adult medical examination without abnormal findings: Secondary | ICD-10-CM | POA: Diagnosis not present

## 2017-08-05 DIAGNOSIS — N289 Disorder of kidney and ureter, unspecified: Secondary | ICD-10-CM

## 2017-08-05 DIAGNOSIS — N401 Enlarged prostate with lower urinary tract symptoms: Secondary | ICD-10-CM

## 2017-08-05 DIAGNOSIS — Z7189 Other specified counseling: Secondary | ICD-10-CM

## 2017-08-05 DIAGNOSIS — R972 Elevated prostate specific antigen [PSA]: Secondary | ICD-10-CM

## 2017-08-05 DIAGNOSIS — F418 Other specified anxiety disorders: Secondary | ICD-10-CM

## 2017-08-05 DIAGNOSIS — N138 Other obstructive and reflux uropathy: Secondary | ICD-10-CM

## 2017-08-05 NOTE — Assessment & Plan Note (Signed)
Discussed family stressors. He continues rare klonopin, not interested in further medication for this.  PHQ9 = 2 GAD7 = 7

## 2017-08-05 NOTE — Assessment & Plan Note (Signed)
Mild. Will continue to monitor. Encouraged good hydration status.

## 2017-08-05 NOTE — Patient Instructions (Addendum)
Depression screening questionairre today.  If interested, check with pharmacy about new 2 shot shingles series (shingrix).  Work on advanced directive - packet provided today (last year I received more legal documents, not healthcare related).  Return as needed or in 1 year for next medicare wellness visit   Health Maintenance, Male A healthy lifestyle and preventive care is important for your health and wellness. Ask your health care provider about what schedule of regular examinations is right for you. What should I know about weight and diet? Eat a Healthy Diet  Eat plenty of vegetables, fruits, whole grains, low-fat dairy products, and lean protein.  Do not eat a lot of foods high in solid fats, added sugars, or salt.  Maintain a Healthy Weight Regular exercise can help you achieve or maintain a healthy weight. You should:  Do at least 150 minutes of exercise each week. The exercise should increase your heart rate and make you sweat (moderate-intensity exercise).  Do strength-training exercises at least twice a week.  Watch Your Levels of Cholesterol and Blood Lipids  Have your blood tested for lipids and cholesterol every 5 years starting at 78 years of age. If you are at high risk for heart disease, you should start having your blood tested when you are 78 years old. You may need to have your cholesterol levels checked more often if: ? Your lipid or cholesterol levels are high. ? You are older than 78 years of age. ? You are at high risk for heart disease.  What should I know about cancer screening? Many types of cancers can be detected early and may often be prevented. Lung Cancer  You should be screened every year for lung cancer if: ? You are a current smoker who has smoked for at least 30 years. ? You are a former smoker who has quit within the past 15 years.  Talk to your health care provider about your screening options, when you should start screening, and how often  you should be screened.  Colorectal Cancer  Routine colorectal cancer screening usually begins at 78 years of age and should be repeated every 5-10 years until you are 78 years old. You may need to be screened more often if early forms of precancerous polyps or small growths are found. Your health care provider may recommend screening at an earlier age if you have risk factors for colon cancer.  Your health care provider may recommend using home test kits to check for hidden blood in the stool.  A small camera at the end of a tube can be used to examine your colon (sigmoidoscopy or colonoscopy). This checks for the earliest forms of colorectal cancer.  Prostate and Testicular Cancer  Depending on your age and overall health, your health care provider may do certain tests to screen for prostate and testicular cancer.  Talk to your health care provider about any symptoms or concerns you have about testicular or prostate cancer.  Skin Cancer  Check your skin from head to toe regularly.  Tell your health care provider about any new moles or changes in moles, especially if: ? There is a change in a mole's size, shape, or color. ? You have a mole that is larger than a pencil eraser.  Always use sunscreen. Apply sunscreen liberally and repeat throughout the day.  Protect yourself by wearing long sleeves, pants, a wide-brimmed hat, and sunglasses when outside.  What should I know about heart disease, diabetes, and high blood pressure?  If you are 20-1 years of age, have your blood pressure checked every 3-5 years. If you are 76 years of age or older, have your blood pressure checked every year. You should have your blood pressure measured twice-once when you are at a hospital or clinic, and once when you are not at a hospital or clinic. Record the average of the two measurements. To check your blood pressure when you are not at a hospital or clinic, you can use: ? An automated blood pressure  machine at a pharmacy. ? A home blood pressure monitor.  Talk to your health care provider about your target blood pressure.  If you are between 6-25 years old, ask your health care provider if you should take aspirin to prevent heart disease.  Have regular diabetes screenings by checking your fasting blood sugar level. ? If you are at a normal weight and have a low risk for diabetes, have this test once every three years after the age of 57. ? If you are overweight and have a high risk for diabetes, consider being tested at a younger age or more often.  A one-time screening for abdominal aortic aneurysm (AAA) by ultrasound is recommended for men aged 14-75 years who are current or former smokers. What should I know about preventing infection? Hepatitis B If you have a higher risk for hepatitis B, you should be screened for this virus. Talk with your health care provider to find out if you are at risk for hepatitis B infection. Hepatitis C Blood testing is recommended for:  Everyone born from [birth year] through 1967.  Anyone with known risk factors for hepatitis C.  Sexually Transmitted Diseases (STDs)  You should be screened each year for STDs including gonorrhea and chlamydia if: ? You are sexually active and are younger than 78 years of age. ? You are older than 78 years of age and your health care provider tells you that you are at risk for this type of infection. ? Your sexual activity has changed since you were last screened and you are at an increased risk for chlamydia or gonorrhea. Ask your health care provider if you are at risk.  Talk with your health care provider about whether you are at high risk of being infected with HIV. Your health care provider may recommend a prescription medicine to help prevent HIV infection.  What else can I do?  Schedule regular health, dental, and eye exams.  Stay current with your vaccines (immunizations).  Do not use any tobacco products,  such as cigarettes, chewing tobacco, and e-cigarettes. If you need help quitting, ask your health care provider.  Limit alcohol intake to no more than 2 drinks per day. One drink equals 12 ounces of beer, 5 ounces of wine, or 1 ounces of hard liquor.  Do not use street drugs.  Do not share needles.  Ask your health care provider for help if you need support or information about quitting drugs.  Tell your health care provider if you often feel depressed.  Tell your health care provider if you have ever been abused or do not feel safe at home. This information is not intended to replace advice given to you by your health care provider. Make sure you discuss any questions you have with your health care provider. Document Released: 05/27/2008 Document Revised: 07/28/2016 Document Reviewed: 09/02/2015 Elsevier Interactive Patient Education  [employer].

## 2017-08-05 NOTE — Assessment & Plan Note (Signed)
Preventative protocols reviewed and updated unless pt declined. Discussed healthy diet and lifestyle.  

## 2017-08-05 NOTE — Assessment & Plan Note (Addendum)
Received 08/2016 - durable POA are [name] then [name]. No advanced directive information. Packet provided today.

## 2017-08-05 NOTE — Progress Notes (Signed)
BP 128/60   Pulse (!) 56   Temp 97.8 F (36.6 C) (Oral)   Ht 5\' 6"  (1.676 m)   Wt 150 lb 4 oz (68.2 kg)   SpO2 97%   BMI 24.25 kg/m    CC: CPE/AMW Subjective:    Patient ID: [name], male    DOB: [birth year], 76 y.o.   MRN: [id number]  HPI: [name] is a 76 y.o. male presenting on 08/05/2017 for Annual Exam   On klonopin - with rare use. Last filled #50 12/2016  Denies falls in the last year Depression - screening today.  Eye doctor this month at [facility] - good report  Preventative: Colon cancer screening - mod diverticulosis o/w WNL (2009) Prostate cancer screening - followed regularly by urology (Dr [name]) for high PSA with 3 normal biopsies.  Flu shot yearly Pneumovax 01/2011, prevnar 2015 Tdap 01/2011  zostavax - 2016 shingrix - discussed Advanced directives: Received legal documents 08/2016 - durable POA are [name] then [name]. No advanced directive information. Packet provided today.  Seat belt use discussed Sunscreen use discussed. No changing moles on skin Non smoker Alcohol - none  Lives alone currently in apartment  1 grown daughter  Working on getting back together with wife - [name] (legally separated)  Occupation: retired, worked in Public relations account executive for 30 yrs  Edu: GED  Activity: walking some  Diet: good wtaer, fruits/vegetables daily.   Relevant past medical, surgical, family and social history reviewed and updated as indicated. Interim medical history since our last visit reviewed. Allergies and medications reviewed and updated. Outpatient Medications Prior to Visit  Medication Sig Dispense Refill  . clonazePAM (KLONOPIN) 1 MG tablet TAKE ONE TABLET BY MOUTH TWICE DAILY AS NEEDED 50 tablet 0  . metoprolol succinate (TOPROL-XL) 25 MG 24 hr tablet TAKE 1/2 TABLET BY MOUTH EVERY DAY. 45 tablet 0  . Naproxen Sodium 220 MG CAPS Take by mouth.    . Omega-3 Fatty Acids (FISH OIL) 1000 MG CAPS Take 1 capsule by mouth 2 (two)  times daily.     No facility-administered medications prior to visit.      Per HPI unless specifically indicated in ROS section below Review of Systems  Constitutional: Negative for activity change, appetite change, chills, fatigue, fever and unexpected weight change.  HENT: Negative for hearing loss.   Eyes: Negative for visual disturbance.  Respiratory: Negative for cough, chest tightness, shortness of breath and wheezing.   Cardiovascular: Negative for chest pain, palpitations and leg swelling.  Gastrointestinal: Negative for abdominal distention, abdominal pain, blood in stool, constipation, diarrhea, nausea and vomiting.  Genitourinary: Negative for difficulty urinating and hematuria.  Musculoskeletal: Negative for arthralgias, myalgias and neck pain.  Skin: Negative for rash.  Neurological: Negative for dizziness, seizures, syncope and headaches.  Hematological: Negative for adenopathy. Does not bruise/bleed easily.  Psychiatric/Behavioral: Negative for dysphoric mood. The patient is not nervous/anxious.        Objective:    BP 128/60   Pulse (!) 56   Temp 97.8 F (36.6 C) (Oral)   Ht 5\' 6"  (1.676 m)   Wt 150 lb 4 oz (68.2 kg)   SpO2 97%   BMI 24.25 kg/m   Wt Readings from Last 3 Encounters:  08/05/17 150 lb 4 oz (68.2 kg)  07/14/17 151 lb 12.8 oz (68.9 kg)  03/08/17 152 lb 12.8 oz (69.3 kg)    Physical Exam  Constitutional: He is oriented to person, place, and time. He  appears well-developed and well-nourished. No distress.  HENT:  Head: Normocephalic and atraumatic.  Right Ear: Hearing, tympanic membrane, external ear and ear canal normal.  Left Ear: Hearing, tympanic membrane, external ear and ear canal normal.  Nose: Nose normal.  Mouth/Throat: Uvula is midline, oropharynx is clear and moist and mucous membranes are normal. No oropharyngeal exudate, posterior oropharyngeal edema or posterior oropharyngeal erythema.  Eyes: Pupils are equal, round, and reactive  to light. Conjunctivae and EOM are normal. No scleral icterus.  Neck: Normal range of motion. Neck supple. Carotid bruit is not present. No thyromegaly present.  Cardiovascular: Normal rate, regular rhythm, normal heart sounds and intact distal pulses.   No murmur heard. Pulses:      Radial pulses are 2+ on the right side, and 2+ on the left side.  Pulmonary/Chest: Effort normal and breath sounds normal. No respiratory distress. He has no wheezes. He has no rales.  Abdominal: Soft. Bowel sounds are normal. He exhibits no distension and no mass. There is no tenderness. There is no rebound and no guarding.  Musculoskeletal: Normal range of motion. He exhibits no edema.  Lymphadenopathy:    He has no cervical adenopathy.  Neurological: He is alert and oriented to person, place, and time.  CN grossly intact, station and gait intact Recall 3/3  Calculation 5/5 serial 3s  Skin: Skin is warm and dry. No rash noted.  Psychiatric: He has a normal mood and affect. His behavior is normal. Judgment and thought content normal.  Nursing note and vitals reviewed.  Results for orders placed or performed in visit on 07/29/17  Comprehensive metabolic panel  Result Value Ref Range   Sodium 143 135 - 145 mEq/L   Potassium 4.8 3.5 - 5.1 mEq/L   Chloride 107 96 - 112 mEq/L   CO2 30 19 - 32 mEq/L   Glucose, Bld 96 70 - 99 mg/dL   BUN 18 6 - 23 mg/dL   Creatinine, Ser 1.34 0.40 - 1.50 mg/dL   Total Bilirubin 1.0 0.2 - 1.2 mg/dL   Alkaline Phosphatase 79 39 - 117 U/L   AST 19 0 - 37 U/L   ALT 14 0 - 53 U/L   Total Protein 6.5 6.0 - 8.3 g/dL   Albumin 3.9 3.5 - 5.2 g/dL   Calcium 9.1 8.4 - 10.5 mg/dL   GFR 54.79 (L) >60.00 mL/min  Lipid panel  Result Value Ref Range   Cholesterol 175 0 - 200 mg/dL   Triglycerides 87.0 0.0 - 149.0 mg/dL   HDL 47.40 >39.00 mg/dL   VLDL 17.4 0.0 - 40.0 mg/dL   LDL Cholesterol 110 (H) 0 - 99 mg/dL   Total CHOL/HDL Ratio 4    NonHDL 127.44   Hemoglobin A1c  Result  Value Ref Range   Hgb A1c MFr Bld 5.4 4.6 - 6.5 %      Assessment & Plan:   Problem List Items Addressed This Visit    Advanced care planning/counseling discussion    Received 08/2016 - durable POA are [name] then [name]. No advanced directive information. Packet provided today.       BPH with urinary obstruction    Followed by urology      Depression with anxiety    Discussed family stressors. He continues rare klonopin, not interested in further medication for this.  PHQ9 = 2 GAD7 = 7      Elevated PSA    Followed by urology      Health maintenance  examination    Preventative protocols reviewed and updated unless pt declined. Discussed healthy diet and lifestyle.       Medicare annual wellness visit, subsequent - Primary    I have personally reviewed the Medicare Annual Wellness questionnaire and have noted 1. The patient's medical and social history 2. Their use of alcohol, tobacco or illicit drugs 3. Their current medications and supplements 4. The patient's functional ability including ADL's, fall risks, home safety risks and hearing or visual impairment. Cognitive function has been assessed and addressed as indicated.  5. Diet and physical activity 6. Evidence for depression or mood disorders The patients weight, height, BMI have been recorded in the chart. I have made referrals, counseling and provided education to the patient based on review of the above and I have provided the pt with a written personalized care plan for preventive services. Provider list updated.. See scanned questionairre as needed for further documentation. Reviewed preventative protocols and updated unless pt declined.       Renal insufficiency    Mild. Will continue to monitor. Encouraged good hydration status.           Follow up plan: Return in about 1 year (around 08/05/2018) for annual exam, prior fasting for blood work, medicare wellness visit.  [name], MD

## 2017-08-05 NOTE — Assessment & Plan Note (Signed)

## 2017-08-05 NOTE — Assessment & Plan Note (Signed)
Followed by urology.   

## 2017-08-29 ENCOUNTER — Other Ambulatory Visit: Payer: Self-pay | Admitting: Family Medicine

## 2017-08-29 NOTE — Telephone Encounter (Signed)
Last filled 06/16/17 #60, last OV (CPE) 08/05/17 next OV 08/11/18.

## 2017-08-30 NOTE — Telephone Encounter (Signed)
Refill left on vm at pharmacy.  

## 2017-08-30 NOTE — Telephone Encounter (Signed)
plz phone in. 

## 2017-08-31 ENCOUNTER — Other Ambulatory Visit: Payer: Self-pay

## 2017-08-31 DIAGNOSIS — N401 Enlarged prostate with lower urinary tract symptoms: Secondary | ICD-10-CM

## 2017-09-01 ENCOUNTER — Other Ambulatory Visit: Payer: Medicare HMO

## 2017-09-01 ENCOUNTER — Ambulatory Visit (INDEPENDENT_AMBULATORY_CARE_PROVIDER_SITE_OTHER): Payer: Medicare HMO

## 2017-09-01 DIAGNOSIS — Z23 Encounter for immunization: Secondary | ICD-10-CM

## 2017-09-01 DIAGNOSIS — N401 Enlarged prostate with lower urinary tract symptoms: Secondary | ICD-10-CM | POA: Diagnosis not present

## 2017-09-02 LAB — PSA: Prostate Specific Ag, Serum: 16.5 ng/mL — ABNORMAL HIGH (ref 0.0–4.0)

## 2017-09-07 NOTE — Progress Notes (Signed)
09/08/2017 2:55 PM   [name] 06/27/1941 [id number]  Referring provider: [name], MD [address] [place], [place] [address]  Chief Complaint  Patient presents with  . Nephrolithiasis    6 month follow up   . Prostate Cancer  . Benign Prostatic Hypertrophy    HPI: Patient is a 76 year old Caucasian male with recurrent kidney stones, BPH with LUTS, elevated PSA and urge incontinence who presents today for 6 month follow-up.  Recurrent kidney stones Patient has an occasional episode of intermittent flank pain.  He is s/p left URS/LL/ureteral stent placement with stent removal in 10/2014, right ESWL in 12/2014.  Metabolic work up indicated a low volume of urine at 1.47 L and low urinary citrate. Urinary pH 5.56.  His 24 hour urine magnesium was borderline low.  No other obvious metabolic derangements. His creatinine was 1.13.  He reports that he has not been diligent in increasing his water intake.  KUB on 09/08/2017 again noted bilateral nephrolithiasis.  He is still reluctant to increase his water intake.    BPH WITH LUTS His IPSS score today is 3, which is mild lower urinary tract symptomatology. He is pleased with his quality life due to his urinary symptoms.  His PVR was 0 mL.  His previous I PSS score was 14/3.  His previous PVR was 32 mL.  His major complaints today are nocturia 3 and incontinence.   His previous IPSS score was 14/2.  His PVR is 11 mL.  He has had these symptoms for several years.  He denies any dysuria, hematuria or suprapubic pain.  Prostate enlarged on transabdominal ultrasound volume 5.1 x 5.5 x 6.6 cm with median lobe.  He also denies any recent fevers, chills, nausea or vomiting.  He does not have a family history of PCa.     IPSS    Row Name 09/08/17 1400         International Prostate Symptom Score   How often have you had the sensation of not emptying your bladder? Not at All     How often have you had to urinate less than every  two hours? Not at All     How often have you found you stopped and started again several times when you urinated? Not at All     How often have you found it difficult to postpone urination? Not at All     How often have you had a weak urinary stream? Not at All     How often have you had to strain to start urination? Not at All     How many times did you typically get up at night to urinate? 3 Times     Total IPSS Score 3       Quality of Life due to urinary symptoms   If you were to spend the rest of your life with your urinary condition just the way it is now how would you feel about that? Pleased        Score:  1-7 Mild 8-19 Moderate 20-35 Severe  Elevated PSA Patient has had a history of s/p negative biopsy x 3. Awaiting records after numerous requests from patient and [name] office. Most recent PSA very elevated (19) but likely secondary to recent endoscopic manipulation. Repeat PSA 15 ng/dL on 12/15, and 13.3 on 03/13/15.  Now, PSA was 19.23 on 07/20/2016.  Prostate enlarged on transabdominal ultrasound volume 5.1 x 5.5 x 6.6 cm..  A free  and total PSA noted a PSA of 15.2 with a probability of 9% of having prostate cancer.  He does not want to undergo another biopsy.  Current PSA is 16.5 ng/mL on 09/01/2017.    Urge incontinence  PVR is 0 mL.  He is no longer taking the Vesicare.  He is not finding his urge incontinence bothersome at this time.   PMH: Past Medical History:  Diagnosis Date  . BPH with urinary obstruction    sees urology Dr. [name]  . Carotid stenosis 07/20/2016   Minimal on [place] 07/2016. Monitor clinically.  . Depression with anxiety   . Elevated PSA 2014   sees urology - normal biopsies x 3  . HLD (hyperlipidemia)   . Left kidney mass 09/2014   on CT scan thought consistent with simple cyst on prior US  . Nasal obstruction    congenital R sided  . Obstructive pyelonephritis 09/2014   s/p hospitalization and treatment with levaquin  . OSA  (obstructive sleep apnea)    pt denies  . Recurrent kidney stones 2014   extensive bilateral nephrolithiasis, sees urology [name]), hydrocodone prn  . Symptomatic PVCs    per prior cardiologist, on beta blocker  . Urge incontinence    thought overactive bladder    Surgical History: Past Surgical History:  Procedure Laterality Date  . CARDIAC CATHETERIZATION  03/2012   EF 45-50%, no LVH, + impaired LV relaxation, no PH, no valve abnormalities  . CARDIOVASCULAR STRESS TEST  03/2012   mild peri infarct ischemia in apical segment, decreased EF  . CATARACT EXTRACTION Right 01/2013  . COLONOSCOPY  02/2008   mod diverticulosis o/w WNL (2009)  . CYSTOSCOPY    . CYSTOSCOPY  10/2014   s/p stone removal, with stent removal [name])  . TONSILECTOMY, ADENOIDECTOMY, BILATERAL MYRINGOTOMY AND TUBES     removed as a child  . US ECHOCARDIOGRAPHY  03/2012   hypokinetic anterior wall, EF 45-50%, impaired relaxation pattern, mild LA dilation, mild-mod MR, mild-mod AR    Home Medications:  Allergies as of 09/08/2017   No Known Allergies     Medication List       Accurate as of 09/08/17  2:55 PM. Always use your most recent med list.          clonazePAM 1 MG tablet Commonly known as:  KLONOPIN TAKE ONE TABLET BY MOUTH TWICE DAILY AS NEEDED   Fish Oil 1000 MG Caps Take 1 capsule by mouth 2 (two) times daily.   metoprolol succinate 25 MG 24 hr tablet Commonly known as:  TOPROL-XL TAKE 1/2 TABLET BY MOUTH EVERY DAY.   Naproxen Sodium 220 MG Caps Take by mouth.   omega-3 acid ethyl esters 1 g capsule Commonly known as:  LOVAZA Take by mouth.            Discharge Care Instructions        Start     Ordered   09/08/17 0000  BLADDER SCAN AMB NON-IMAGING     09/08/17 1407   09/08/17 0000  DG Abd 1 View    Question Answer Comment  Reason for Exam (SYMPTOM  OR DIAGNOSIS REQUIRED) history of stones   Preferred imaging location? [facility]      09/08/17 1455       Allergies: No Known Allergies  Family History: Family History  Problem Relation Age of Onset  . Urolithiasis Daughter   . Kidney Stones Mother   . Diabetes Neg Hx   . Cancer  Neg Hx   . CAD Neg Hx   . Stroke Neg Hx   . Hypertension Neg Hx   . Prostate cancer Neg Hx   . Kidney cancer Neg Hx   . Bladder Cancer Neg Hx     Social History:  reports that he has never smoked. He has never used smokeless tobacco. He reports that he does not drink alcohol or use drugs.  ROS: UROLOGY Frequent Urination?: No Hard to postpone urination?: No Burning/pain with urination?: No Get up at night to urinate?: Yes Leakage of urine?: Yes Urine stream starts and stops?: No Trouble starting stream?: No Do you have to strain to urinate?: No Blood in urine?: No Urinary tract infection?: No Sexually transmitted disease?: No Injury to kidneys or bladder?: No Painful intercourse?: No Weak stream?: No Erection problems?: No Penile pain?: No  Gastrointestinal Nausea?: No Vomiting?: No Indigestion/heartburn?: No Diarrhea?: No Constipation?: No  Constitutional Fever: No Night sweats?: No Weight loss?: No Fatigue?: No  Skin Skin rash/lesions?: No Itching?: No  Eyes Blurred vision?: No Double vision?: No  Ears/Nose/Throat Sore throat?: No Sinus problems?: No  Hematologic/Lymphatic Swollen glands?: No Easy bruising?: No  Cardiovascular Leg swelling?: No Chest pain?: No  Respiratory Cough?: No Shortness of breath?: No  Endocrine Excessive thirst?: No  Musculoskeletal Back pain?: No Joint pain?: No  Neurological Headaches?: No Dizziness?: No  Psychologic Depression?: No Anxiety?: No  Physical Exam: BP (!) 153/64   Pulse 67   Ht 5\' 7"  (1.702 m)   Wt 155 lb 4.8 oz (70.4 kg)   BMI 24.32 kg/m   Constitutional: Well nourished. Alert and oriented, No acute distress. HEENT: [place] AT, moist mucus membranes. Trachea midline, no masses. Cardiovascular: No  clubbing, cyanosis, or edema. Respiratory: Normal respiratory effort, no increased work of breathing. GI: Abdomen is soft, non tender, non distended, no abdominal masses. Liver and spleen not palpable.  No hernias appreciated.  Stool sample for occult testing is not indicated.   GU: No CVA tenderness.  No bladder fullness or masses.  Patient with uncircumcised phallus.  Foreskin easily retracted  Urethral meatus is patent.  No penile discharge. No penile lesions or rashes. Scrotum without lesions, cysts, rashes and/or edema.  Testicles are located scrotally bilaterally. No masses are appreciated in the testicles. Left and right epididymis are normal. Rectal: Patient with  normal sphincter tone. Anus and perineum without scarring or rashes. No rectal masses are appreciated. Prostate is approximately 70 + grams, no nodules are appreciated. Seminal vesicles are normal. Skin: No rashes, bruises or suspicious lesions. Lymph: No cervical or inguinal adenopathy. Neurologic: Grossly intact, no focal deficits, moving all 4 extremities. Psychiatric: Normal mood and affect.   Laboratory Data: Lab Results  Component Value Date   CREATININE 1.34 07/29/2017   PSA History  16.6 ng/mL on 03/01/2017  16.5 ng/mL on 09/01/2017   Lab Results  Component Value Date   PSA 19.23 (H) 07/20/2016   PSA 14.30 (H) 02/12/2014        Component Value Date/Time   CHOL 175 07/29/2017 1258   HDL 47.40 07/29/2017 1258   CHOLHDL 4 07/29/2017 1258   VLDL 17.4 07/29/2017 1258   LDLCALC 110 (H) 07/29/2017 1258    Lab Results  Component Value Date   AST 19 07/29/2017   Lab Results  Component Value Date   ALT 14 07/29/2017   I have reviewed labs  Pertinent Imaging: Results for DANTONIO, JUSTEN (MRN [id number]) as of 09/08/2017 14:39  Ref. Range 09/08/2017  14:13  Scan Result Unknown 0    Assessment & Plan:    1. Recurrent nephrolithiasis  - patient with bilateral stones on 08/2017 KUB bilateral nephrolithiasis  is again noted  - not bothersome to him at this time  - has not been increasing water intake to 2.5 L- again encouraged to do so- reluctant to do so due to urge incontinence  - recent creatinine 1.34 on 07/20/2017  - RTC in 6 months for KUB  2. BPH with LUTS  - IPSS score is 3/1, it is improving  - Continue conservative management, avoiding bladder irritants and timed voiding's  - RTC in 6 months for IPSS, PSA and PVR  3. Elevated PSA  - He would like to continue the screening             - He will RTC in 6 months for PSA and exam - if PSA is stable   4. Urge incontinence  - PVR is minimal  - managing conservatively   - RTC in 6 months for IPSS and PVR  Return in about 6 months (around 03/08/2018) for IPSS, PSA and exam.  These notes generated with voice recognition software. I apologize for typographical errors.  [name], [facility] [address], [place], [place] [address] [phone]

## 2017-09-08 ENCOUNTER — Ambulatory Visit
Admission: RE | Admit: 2017-09-08 | Discharge: 2017-09-08 | Disposition: A | Discharge status: Home / Self Care | Payer: Medicare HMO | Source: Ambulatory Visit | Attending: Urology | Admitting: Urology

## 2017-09-08 ENCOUNTER — Ambulatory Visit: Payer: Medicare HMO | Admitting: Urology

## 2017-09-08 ENCOUNTER — Encounter: Payer: Self-pay | Admitting: Urology

## 2017-09-08 VITALS — BP 153/64 | HR 67 | Ht 67.0 in | Wt 155.3 lb

## 2017-09-08 DIAGNOSIS — Z87442 Personal history of urinary calculi: Secondary | ICD-10-CM | POA: Diagnosis not present

## 2017-09-08 DIAGNOSIS — R972 Elevated prostate specific antigen [PSA]: Secondary | ICD-10-CM

## 2017-09-08 DIAGNOSIS — N200 Calculus of kidney: Secondary | ICD-10-CM

## 2017-09-08 DIAGNOSIS — N3941 Urge incontinence: Secondary | ICD-10-CM

## 2017-09-08 DIAGNOSIS — N138 Other obstructive and reflux uropathy: Secondary | ICD-10-CM

## 2017-09-08 DIAGNOSIS — N401 Enlarged prostate with lower urinary tract symptoms: Secondary | ICD-10-CM

## 2017-09-08 LAB — BLADDER SCAN AMB NON-IMAGING: SCAN RESULT: 0

## 2017-09-09 ENCOUNTER — Telehealth: Payer: Self-pay | Admitting: Family Medicine

## 2017-09-09 NOTE — Telephone Encounter (Signed)
-----   Message from Nori Riis, [name] sent at 09/08/2017  9:34 PM EDT ----- Please let Mr. Wigle know that his KUB demonstrates stones in each kidney.  They are less than 4 mm.  We will see him next year.

## 2017-09-09 NOTE — Telephone Encounter (Signed)
Patient notified

## 2017-09-26 ENCOUNTER — Other Ambulatory Visit: Payer: Self-pay | Admitting: Family Medicine

## 2017-11-11 ENCOUNTER — Other Ambulatory Visit: Payer: Self-pay

## 2017-11-11 MED ORDER — METOPROLOL SUCCINATE ER 25 MG PO TB24: 12.5000 mg | ORAL_TABLET | Freq: Every day | ORAL | 10 refills | Status: DC

## 2017-12-08 ENCOUNTER — Other Ambulatory Visit: Payer: Self-pay

## 2017-12-08 MED ORDER — CLONAZEPAM 1 MG PO TABS: 1.0000 mg | ORAL_TABLET | Freq: Two times a day (BID) | ORAL | 0 refills | Status: DC | PRN

## 2017-12-08 NOTE — Telephone Encounter (Signed)
Received faxed rx request for clonazepam to be faxed to [facility].  Last phoned in to [facility]:  08/30/17, #60 Last OV (CPE):  08/05/17 Next OV:  08/11/18

## 2017-12-08 NOTE — Telephone Encounter (Signed)
Sent in electronicaly

## 2018-02-22 DIAGNOSIS — H40013 Open angle with borderline findings, low risk, bilateral: Secondary | ICD-10-CM | POA: Diagnosis not present

## 2018-03-06 ENCOUNTER — Other Ambulatory Visit: Payer: Self-pay

## 2018-03-06 ENCOUNTER — Other Ambulatory Visit: Payer: Medicare HMO

## 2018-03-06 DIAGNOSIS — N401 Enlarged prostate with lower urinary tract symptoms: Principal | ICD-10-CM

## 2018-03-06 DIAGNOSIS — N138 Other obstructive and reflux uropathy: Secondary | ICD-10-CM

## 2018-03-07 LAB — PSA: Prostate Specific Ag, Serum: 15.7 ng/mL — ABNORMAL HIGH (ref 0.0–4.0)

## 2018-03-07 NOTE — Progress Notes (Signed)
03/08/2018 1:38 PM   [name] 11/06/1941 [id number]  Referring provider: [name], MD [address] [place], [place] [address]  Chief Complaint  Patient presents with  . Benign Prostatic Hypertrophy    HPI: Patient is a 77 year old Caucasian male with recurrent kidney stones, BPH with LUTS, elevated PSA and urge incontinence who presents today for 6 month follow-up.  Recurrent kidney stones Patient has an occasional episode of intermittent flank pain.  He is s/p left URS/LL/ureteral stent placement with stent removal in 10/2014, right ESWL in 12/2014.  Metabolic work up indicated a low volume of urine at 1.47 L and low urinary citrate. Urinary pH 5.56.  His 24 hour urine magnesium was borderline low.  No other obvious metabolic derangements. His creatinine was 1.13.  He reports that he has not been diligent in increasing his water intake.  KUB on 09/08/2017 again noted bilateral nephrolithiasis.  He is still reluctant to increase his water intake.    BPH WITH LUTS His IPSS score today is 7, which is mild lower urinary tract symptomatology.  He is pleased with his quality life due to his urinary symptoms.  His PVR was 98 mL.  His previous I PSS score was 3/1.  His previous PVR was 0 mL.  His major complaints today are frequency, nocturia, incontinence, intermittency and a weak stream.  His previous IPSS score was 14/2.  His PVR is 11 mL.  He has had these symptoms for several years.  He denies any dysuria, hematuria or suprapubic pain.  Prostate enlarged on transabdominal ultrasound volume 5.1 x 5.5 x 6.6 cm with median lobe.  He also denies any recent fevers, chills, nausea or vomiting.  He does not have a family history of PCa. IPSS    Row Name 03/08/18 1300         International Prostate Symptom Score   How often have you had the sensation of not emptying your bladder?  Not at All     How often have you had to urinate less than every two hours?  Not at All     How  often have you found you stopped and started again several times when you urinated?  Less than half the time     How often have you found it difficult to postpone urination?  Not at All     How often have you had a weak urinary stream?  Less than half the time     How often have you had to strain to start urination?  Less than 1 in 5 times     How many times did you typically get up at night to urinate?  2 Times     Total IPSS Score  7       Quality of Life due to urinary symptoms   If you were to spend the rest of your life with your urinary condition just the way it is now how would you feel about that?  Pleased        Score:  1-7 Mild 8-19 Moderate 20-35 Severe  Elevated PSA Patient has had a history of s/p negative biopsy x 3. Awaiting records after numerous requests from patient and [name] office. Most recent PSA very elevated (19) but likely secondary to recent endoscopic manipulation. Repeat PSA 15 ng/dL on 12/15, and 13.3 on 03/13/15.  Now, PSA was 19.23 on 07/20/2016.  Prostate enlarged on transabdominal ultrasound volume 5.1 x 5.5 x 6.6 cm.[name]  A  free and total PSA noted a PSA of 15.2 with a probability of 9% of having prostate cancer.  He does not want to undergo another biopsy.  Current PSA is 15.7 ng/mL on 03/06/2018  Urge incontinence  PVR is 98 mL.  He is no longer taking the Vesicare.  He is not finding his urge incontinence bothersome at this time.   PMH: Past Medical History:  Diagnosis Date  . BPH with urinary obstruction    sees urology Dr. [name]  . Carotid stenosis 07/20/2016   Minimal on [place] 07/2016. Monitor clinically.  . Depression with anxiety   . Elevated PSA 2014   sees urology - normal biopsies x 3  . HLD (hyperlipidemia)   . Left kidney mass 09/2014   on CT scan thought consistent with simple cyst on prior US  . Nasal obstruction    congenital R sided  . Obstructive pyelonephritis 09/2014   s/p hospitalization and treatment with levaquin  .  OSA (obstructive sleep apnea)    pt denies  . Recurrent kidney stones 2014   extensive bilateral nephrolithiasis, sees urology [name]), hydrocodone prn  . Symptomatic PVCs    per prior cardiologist, on beta blocker  . Urge incontinence    thought overactive bladder    Surgical History: Past Surgical History:  Procedure Laterality Date  . CARDIAC CATHETERIZATION  03/2012   EF 45-50%, no LVH, + impaired LV relaxation, no PH, no valve abnormalities  . CARDIOVASCULAR STRESS TEST  03/2012   mild peri infarct ischemia in apical segment, decreased EF  . CATARACT EXTRACTION Right 01/2013  . COLONOSCOPY  02/2008   mod diverticulosis o/w WNL (2009)  . CYSTOSCOPY    . CYSTOSCOPY  10/2014   s/p stone removal, with stent removal [name])  . TONSILECTOMY, ADENOIDECTOMY, BILATERAL MYRINGOTOMY AND TUBES     removed as a child  . US ECHOCARDIOGRAPHY  03/2012   hypokinetic anterior wall, EF 45-50%, impaired relaxation pattern, mild LA dilation, mild-mod MR, mild-mod AR    Home Medications:  Allergies as of 03/08/2018   No Known Allergies     Medication List        Accurate as of 03/08/18  1:38 PM. Always use your most recent med list.          clonazePAM 1 MG tablet Commonly known as:  KLONOPIN Take 1 tablet (1 mg total) by mouth 2 (two) times daily as needed.   Fish Oil 1000 MG Caps Take 1 capsule by mouth 2 (two) times daily.   metoprolol succinate 25 MG 24 hr tablet Commonly known as:  TOPROL-XL Take 0.5 tablets (12.5 mg total) by mouth daily.   Naproxen Sodium 220 MG Caps Take by mouth.   omega-3 acid ethyl esters 1 g capsule Commonly known as:  LOVAZA Take by mouth.       Allergies: No Known Allergies  Family History: Family History  Problem Relation Age of Onset  . Urolithiasis Daughter   . Kidney Stones Mother   . Diabetes Neg Hx   . Cancer Neg Hx   . CAD Neg Hx   . Stroke Neg Hx   . Hypertension Neg Hx   . Prostate cancer Neg Hx   . Kidney cancer Neg Hx   .  Bladder Cancer Neg Hx     Social History:  reports that he has never smoked. He has never used smokeless tobacco. He reports that he does not drink alcohol or use drugs.  ROS: UROLOGY Frequent  Urination?: Yes Hard to postpone urination?: No Burning/pain with urination?: No Get up at night to urinate?: Yes Leakage of urine?: Yes Urine stream starts and stops?: Yes Trouble starting stream?: No Do you have to strain to urinate?: No Blood in urine?: No Urinary tract infection?: No Sexually transmitted disease?: No Injury to kidneys or bladder?: No Painful intercourse?: No Weak stream?: Yes Erection problems?: No Penile pain?: No  Gastrointestinal Nausea?: No Vomiting?: No Indigestion/heartburn?: No Diarrhea?: No Constipation?: No  Constitutional Fever: No Night sweats?: No Weight loss?: No Fatigue?: No  Skin Skin rash/lesions?: No Itching?: No  Eyes Blurred vision?: No Double vision?: No  Ears/Nose/Throat Sore throat?: No Sinus problems?: No  Hematologic/Lymphatic Swollen glands?: No Easy bruising?: No  Cardiovascular Leg swelling?: No Chest pain?: No  Respiratory Cough?: No Shortness of breath?: No  Endocrine Excessive thirst?: No  Musculoskeletal Back pain?: No Joint pain?: No  Neurological Headaches?: No Dizziness?: No  Psychologic Depression?: No Anxiety?: No  Physical Exam: BP (!) 167/77   Pulse 61   Ht 5\' 7"  (1.702 m)   Wt 157 lb 11.2 oz (71.5 kg)   BMI 24.70 kg/m   Constitutional: Well nourished. Alert and oriented, No acute distress. HEENT: [place] AT, moist mucus membranes. Trachea midline, no masses. Cardiovascular: No clubbing, cyanosis, or edema. Respiratory: Normal respiratory effort, no increased work of breathing. GI: Abdomen is soft, non tender, non distended, no abdominal masses. Liver and spleen not palpable.  No hernias appreciated.  Stool sample for occult testing is not indicated.   GU: No CVA tenderness.  No  bladder fullness or masses.  Patient with uncircumcised phallus.  Foreskin easily retracted.   Urethral meatus is patent.  No penile discharge. No penile lesions or rashes. Scrotum without lesions, cysts, rashes and/or edema.  Testicles are located scrotally bilaterally. No masses are appreciated in the testicles. Left and right epididymis are normal. Rectal: Patient with  normal sphincter tone. Anus and perineum without scarring or rashes. No rectal masses are appreciated. Prostate is approximately 70 +grams, no nodules are appreciated. Seminal vesicles could not be palpated.   Skin: No rashes, bruises or suspicious lesions. Lymph: No cervical or inguinal adenopathy. Neurologic: Grossly intact, no focal deficits, moving all 4 extremities. Psychiatric: Normal mood and affect.     Laboratory Data: Lab Results  Component Value Date   CREATININE 1.34 07/29/2017   PSA History  16.6 ng/mL on 03/01/2017  16.5 ng/mL on 09/01/2017  15.7 ng.mL on 03/06/2018   Lab Results  Component Value Date   PSA 19.23 (H) 07/20/2016   PSA 14.30 (H) 02/12/2014        Component Value Date/Time   CHOL 175 07/29/2017 1258   HDL 47.40 07/29/2017 1258   CHOLHDL 4 07/29/2017 1258   VLDL 17.4 07/29/2017 1258   LDLCALC 110 (H) 07/29/2017 1258    Lab Results  Component Value Date   AST 19 07/29/2017   Lab Results  Component Value Date   ALT 14 07/29/2017   I have reviewed labs  Pertinent Imaging: Results for DUNG, SALINGER (MRN [id number]) as of 03/08/2018 13:39  Ref. Range 03/08/2018 13:23  Scan Result Unknown 98     Assessment & Plan:    1. Recurrent nephrolithiasis  - patient with bilateral stones on 08/2017 KUB bilateral nephrolithiasis is again noted  - not bothersome to him at this time  - has not been increasing water intake to 2.5 L- again encouraged to do so- reluctant to do so due to  urge incontinence  - recent creatinine 1.34 on 07/20/2017 -  At baseline  - recommended KUB, but  patient declined   Advised to contact our office or seek treatment in the ED if becomes febrile or pain/ vomiting are difficult control in order to arrange for emergent/urgent intervention  2. BPH with LUTS  - IPSS score is 8/1, it is worsening  - Continue conservative management, avoiding bladder irritants and timed voiding's  - offered a trial of medication for his symptoms, but he declined at this time  - RTC in 6 months for IPSS, PSA and PVR  3. Elevated PSA  - He would like to continue the screening - PSA is stable             - He will RTC in 6 months for PSA and exam    4. Urge incontinence  - PVR is minimal  - managing conservatively   - RTC in 6 months for IPSS and PVR  Return in about 6 months (around 09/08/2018) for I PSS, PSA and exam.  These notes generated with voice recognition software. I apologize for typographical errors.  [name], [facility] [address] [place], [place] [address] [phone]

## 2018-03-08 ENCOUNTER — Ambulatory Visit: Payer: Medicare HMO | Admitting: Urology

## 2018-03-08 ENCOUNTER — Encounter: Payer: Self-pay | Admitting: Urology

## 2018-03-08 VITALS — BP 167/77 | HR 61 | Ht 67.0 in | Wt 157.7 lb

## 2018-03-08 DIAGNOSIS — N200 Calculus of kidney: Secondary | ICD-10-CM | POA: Diagnosis not present

## 2018-03-08 DIAGNOSIS — N3941 Urge incontinence: Secondary | ICD-10-CM

## 2018-03-08 DIAGNOSIS — N138 Other obstructive and reflux uropathy: Secondary | ICD-10-CM | POA: Diagnosis not present

## 2018-03-08 DIAGNOSIS — R972 Elevated prostate specific antigen [PSA]: Secondary | ICD-10-CM | POA: Diagnosis not present

## 2018-03-08 DIAGNOSIS — N401 Enlarged prostate with lower urinary tract symptoms: Secondary | ICD-10-CM

## 2018-03-08 LAB — BLADDER SCAN AMB NON-IMAGING: Scan Result: 98

## 2018-04-05 ENCOUNTER — Ambulatory Visit (INDEPENDENT_AMBULATORY_CARE_PROVIDER_SITE_OTHER): Payer: Medicare HMO | Admitting: Family Medicine

## 2018-04-05 ENCOUNTER — Encounter

## 2018-04-05 ENCOUNTER — Encounter: Payer: Self-pay | Admitting: Family Medicine

## 2018-04-05 VITALS — BP 132/78 | HR 66 | Temp 97.8°F | Ht 67.0 in | Wt 155.0 lb

## 2018-04-05 DIAGNOSIS — S39012A Strain of muscle, fascia and tendon of lower back, initial encounter: Secondary | ICD-10-CM

## 2018-04-05 DIAGNOSIS — M47816 Spondylosis without myelopathy or radiculopathy, lumbar region: Secondary | ICD-10-CM | POA: Insufficient documentation

## 2018-04-05 MED ORDER — ACETAMINOPHEN 500 MG PO TABS: 1000.0000 mg | ORAL_TABLET | Freq: Three times a day (TID) | ORAL | Status: DC | PRN

## 2018-04-05 NOTE — Assessment & Plan Note (Signed)
No red flags today. Supportive care reviewed. Continue tylenol 1000mg  TID PRN, add aleve nightly x 5 days (short course to avoid GI and kidney side effects). Discussed heating pad use. Update if not improving with treatment. Pt agrees with plan.

## 2018-04-05 NOTE — Progress Notes (Signed)
BP 132/78 (BP Location: Left Arm, Patient Position: Sitting, Cuff Size: Normal)   Pulse 66   Temp 97.8 F (36.6 C) (Oral)   Ht 5\' 7"  (1.702 m)   Wt 155 lb (70.3 kg)   SpO2 97%   BMI 24.28 kg/m    CC: lower back pain Subjective:    Patient ID: [name], male    DOB: [birth year], 77 y.o.   MRN: [id number]  HPI: Keilon Ressel is a 77 y.o. male presenting on 04/05/2018 for Back Pain (Located across lower back. Tried to lift a mattress about 3 wks ago. Pain occurs when trying to get out of bed. )   3 wk h/o lower back pain, started after trying to lift heavy mattress - felt sudden pain along lower back band. Treating with tylenol ES 500mg  2 pills TID PRN. More noticeable when getting into and out of bed, as well as transitions from sitting to standing. Worse with laying supine. Slowly improving.   Denies shooting pain down legs, numbness/weakness down the legs, fevers/chills, bowel/bladder accidents, or saddle anesthesia. No known h/o cancer.   Relevant past medical, surgical, family and social history reviewed and updated as indicated. Interim medical history since our last visit reviewed. Allergies and medications reviewed and updated. Outpatient Medications Prior to Visit  Medication Sig Dispense Refill  . clonazePAM (KLONOPIN) 1 MG tablet Take 1 tablet (1 mg total) by mouth 2 (two) times daily as needed. 60 tablet 0  . metoprolol succinate (TOPROL-XL) 25 MG 24 hr tablet Take 0.5 tablets (12.5 mg total) by mouth daily. 45 tablet 10  . Naproxen Sodium 220 MG CAPS Take by mouth.    . Omega-3 Fatty Acids (FISH OIL) 1000 MG CAPS Take 1 capsule by mouth 2 (two) times daily.    [name] omega-3 acid ethyl esters (LOVAZA) 1 g capsule Take by mouth.     No facility-administered medications prior to visit.      Per HPI unless specifically indicated in ROS section below Review of Systems     Objective:    BP 132/78 (BP Location: Left Arm, Patient Position: Sitting, Cuff Size: Normal)   Pulse  66   Temp 97.8 F (36.6 C) (Oral)   Ht 5\' 7"  (1.702 m)   Wt 155 lb (70.3 kg)   SpO2 97%   BMI 24.28 kg/m   Wt Readings from Last 3 Encounters:  04/05/18 155 lb (70.3 kg)  03/08/18 157 lb 11.2 oz (71.5 kg)  09/08/17 155 lb 4.8 oz (70.4 kg)    Physical Exam  Constitutional: He appears well-developed and well-nourished. No distress.  Musculoskeletal: Normal range of motion.  No significant pain midline spine Lumbar paraspinous mm discomfort to palpation Neg SLR bilaterally. No pain with int/ext rotation at hip. Neg FABER. No pain at SIJ, GTB or sciatic notch bilaterally.   Neurological: He is alert. He has normal strength. Coordination and gait normal.  5/5 strength BLE  Nursing note and vitals reviewed.  Lab Results  Component Value Date   CREATININE 1.34 07/29/2017   BUN 18 07/29/2017   NA 143 07/29/2017   K 4.8 07/29/2017   CL 107 07/29/2017   CO2 30 07/29/2017       Assessment & Plan:   Problem List Items Addressed This Visit    Lumbar strain, initial encounter - Primary    No red flags today. Supportive care reviewed. Continue tylenol 1000mg  TID PRN, add aleve nightly x 5 days (short course to avoid GI and  kidney side effects). Discussed heating pad use. Update if not improving with treatment. Pt agrees with plan.           Meds ordered this encounter  Medications  . acetaminophen (TYLENOL) 500 MG tablet    Sig: Take 2 tablets (1,000 mg total) by mouth every 8 (eight) hours as needed for moderate pain.   No orders of the defined types were placed in this encounter.   Follow up plan: Return if symptoms worsen or fail to improve.  [name], MD

## 2018-04-05 NOTE — Patient Instructions (Signed)
I think you do have lumbar strain from lifting mattress. Continue tylenol 1000mg  as needed throughout the day (max 3 times a day). Add aleve 220mg  over the counter at night time for the next 5 days then back off.  Restart heating pad to back at night time.  Let us know if not improving over time.

## 2018-04-26 ENCOUNTER — Telehealth: Payer: Self-pay | Admitting: Family Medicine

## 2018-04-26 DIAGNOSIS — M545 Low back pain, unspecified: Secondary | ICD-10-CM

## 2018-04-26 DIAGNOSIS — S39012A Strain of muscle, fascia and tendon of lower back, initial encounter: Secondary | ICD-10-CM

## 2018-04-26 NOTE — Telephone Encounter (Signed)
Pt wants to make an appointment for back pain. States it got better but it is now getting worse. He wanted to see if he needs to come in for xrays prior to making an appointment.

## 2018-04-27 ENCOUNTER — Ambulatory Visit (INDEPENDENT_AMBULATORY_CARE_PROVIDER_SITE_OTHER)
Admission: RE | Admit: 2018-04-27 | Discharge: 2018-04-27 | Disposition: A | Discharge status: Home / Self Care | Payer: Medicare HMO | Source: Ambulatory Visit | Attending: Family Medicine | Admitting: Family Medicine

## 2018-04-27 DIAGNOSIS — M545 Low back pain, unspecified: Secondary | ICD-10-CM

## 2018-04-27 NOTE — Telephone Encounter (Signed)
Yes come in for lumbar films then will decide on need for office visit.  ordered

## 2018-04-27 NOTE — Telephone Encounter (Signed)
Left message on vm per dpr relaying Dr. G's message.  

## 2018-05-24 ENCOUNTER — Ambulatory Visit (INDEPENDENT_AMBULATORY_CARE_PROVIDER_SITE_OTHER): Payer: Medicare HMO | Admitting: Family Medicine

## 2018-05-24 ENCOUNTER — Encounter: Payer: Self-pay | Admitting: Family Medicine

## 2018-05-24 VITALS — BP 152/80 | HR 60 | Temp 97.8°F | Ht 67.0 in | Wt 155.5 lb

## 2018-05-24 DIAGNOSIS — M47816 Spondylosis without myelopathy or radiculopathy, lumbar region: Secondary | ICD-10-CM

## 2018-05-24 DIAGNOSIS — M419 Scoliosis, unspecified: Secondary | ICD-10-CM

## 2018-05-24 DIAGNOSIS — S22080A Wedge compression fracture of T11-T12 vertebra, initial encounter for closed fracture: Secondary | ICD-10-CM

## 2018-05-24 DIAGNOSIS — I70299 Other atherosclerosis of native arteries of extremities, unspecified extremity: Secondary | ICD-10-CM | POA: Diagnosis not present

## 2018-05-24 DIAGNOSIS — R0981 Nasal congestion: Secondary | ICD-10-CM

## 2018-05-24 DIAGNOSIS — I700 Atherosclerosis of aorta: Secondary | ICD-10-CM

## 2018-05-24 DIAGNOSIS — I708 Atherosclerosis of other arteries: Secondary | ICD-10-CM

## 2018-05-24 MED ORDER — TRAMADOL HCL 50 MG PO TABS: 25.0000 mg | ORAL_TABLET | Freq: Two times a day (BID) | ORAL | 0 refills | Status: DC | PRN

## 2018-05-24 MED ORDER — ALENDRONATE SODIUM 70 MG PO TABS: 70.0000 mg | ORAL_TABLET | ORAL | 11 refills | Status: DC

## 2018-05-24 NOTE — Progress Notes (Signed)
BP (!) 152/80 (BP Location: Right Arm, Patient Position: Sitting, Cuff Size: Normal)   Pulse 60   Temp 97.8 F (36.6 C) (Oral)   Ht 5\' 7"  (1.702 m)   Wt 155 lb 8 oz (70.5 kg)   SpO2 98%   BMI 24.35 kg/m    CC: lower back pain Subjective:    Patient ID: [name], male    DOB: [birth year], 77 y.o.   MRN: [id number]  HPI: [name] is a 77 y.o. male presenting on 05/24/2018 for Back Pain (Located across lower back. Started about 3 mos ago after trying to pick up a mattress. Tried Aleve and ice pack, barley helpful. Pt accompanied by dauhgter., [name]. She is concerned since this has been going on for so long.)   3 mo h/o lower back pain started after trying to pick up mattress. Seen 03/2018 - thought lumbar strain. Due to ongoing pain, lumbar films were obtained 04/2018 - see below - showing likely new T12 compression fracture as well as remote T11 compression fracture.   Treating with ice pack, lumbar brace, aleve 2 tablets BID.  Has needed to use walker to get in and out of bed.  Has minimized pain to family. States pain may be "starting to get better today".   Relevant past medical, surgical, family and social history reviewed and updated as indicated. Interim medical history since our last visit reviewed. Allergies and medications reviewed and updated. Outpatient Medications Prior to Visit  Medication Sig Dispense Refill  . acetaminophen (TYLENOL) 500 MG tablet Take 2 tablets (1,000 mg total) by mouth every 8 (eight) hours as needed for moderate pain.    . clonazePAM (KLONOPIN) 1 MG tablet Take 1 tablet (1 mg total) by mouth 2 (two) times daily as needed. 60 tablet 0  . metoprolol succinate (TOPROL-XL) 25 MG 24 hr tablet Take 0.5 tablets (12.5 mg total) by mouth daily. 45 tablet 10  . Naproxen Sodium 220 MG CAPS Take by mouth.    . Omega-3 Fatty Acids (FISH OIL) 1000 MG CAPS Take 1 capsule by mouth 2 (two) times daily.     No facility-administered medications prior to visit.       Per HPI unless specifically indicated in ROS section below Review of Systems     Objective:    BP (!) 152/80 (BP Location: Right Arm, Patient Position: Sitting, Cuff Size: Normal)   Pulse 60   Temp 97.8 F (36.6 C) (Oral)   Ht 5\' 7"  (1.702 m)   Wt 155 lb 8 oz (70.5 kg)   SpO2 98%   BMI 24.35 kg/m   Wt Readings from Last 3 Encounters:  05/24/18 155 lb 8 oz (70.5 kg)  04/05/18 155 lb (70.3 kg)  03/08/18 157 lb 11.2 oz (71.5 kg)    Physical Exam  Constitutional: He appears well-developed and well-nourished.  Musculoskeletal:  Lower thoracic and upper lumbar pain midline spine Bilateral lower thoracic paraspinous mm tenderness Neg SLR bilaterally. No pain at SIJ, GTB or sciatic notch bilaterally.   Neurological: He is alert.  5/5 strength BLE  Skin: There is pallor.  Nursing note and vitals reviewed.  DG Lumbar Spine Complete CLINICAL DATA:  Low back pain for 6 weeks.  EXAM: LUMBAR SPINE - COMPLETE 4+ VIEW  COMPARISON:  09/08/2017.  CT 10/07/2014.  FINDINGS: Lumbar spine numbered with the lowest segmented appearing lumbar shaped vertebrae on lateral view as L5. There is a T11 prominent compression fracture that is unchanged from prior CT  of 10/07/2014. There is a T12 prominent compression fracture, age undetermined. This is not present on prior CT of 10/07/2014. Diffuse osteopenia and degenerative change. Scoliosis lumbar spine concave left. Pelvic calcifications consistent phleboliths. Findings consistent bilateral nephrolithiasis again noted. Aortoiliac atherosclerotic vascular disease.  IMPRESSION: 1. Stable T11 prominent compression fracture. No change from CT of 10/07/2014. Prominent T12 compression fracture, age undetermined. This compression fracture was not present on prior CT of 10/07/2014.  2.  Lumbar scoliosis concave left.  Diffuse degenerative change.  3.  Bilateral nephrolithiasis.  4.  Aortoiliac atherosclerotic vascular  disease.  Electronically Signed   By: Marcello Moores  Register   On: 04/28/2018 07:44  Lab Results  Component Value Date   CREATININE 1.34 07/29/2017   BUN 18 07/29/2017   NA 143 07/29/2017   K 4.8 07/29/2017   CL 107 07/29/2017   CO2 30 07/29/2017   Lab Results  Component Value Date   WBC 6.7 10/23/2014   HGB 14.4 10/23/2014   HCT 44.0 10/23/2014   MCV 92 10/23/2014   PLT 154 10/23/2014       Assessment & Plan:  Elevated blood pressure today attributed to pain.  Problem List Items Addressed This Visit    Lumbar spondylosis   Relevant Medications   traMADol (ULTRAM) 50 MG tablet   Lumbar scoliosis   Compression fracture of T12 vertebra ([place]) - Primary    Presumed new, noted last month on lumbar imaging. Pain has not been well controlled with scheduled NSAID. He did have remote appearing prior T11 compression fracture (present on 2015 imaging). Reviewed options. Pt initially hesitant for stronger pain medication due to sedation, addiction concerns, but agrees to PRN tramadol 1/2-1 tab at night to start for better pain control.  Nasal calcitonin likely not good option given endorsed significant chronic nasal congestion.  Start bisphosphonate in h/o 2 compression fractures - reviewed correct administration of medication (once weekly on empty stomach, need to remain upright and fasting for 1 hour after ingestion), reviewed mechanism of action and possible adverse event of atypical hip fracture as well as warning signs to stop medication and notify us (hip pain, jaw pain).  Low threshold to start PPI given NSAID and bisphosphonate use. Will check DEXA to help guide long term management.  Discussed vertebral augmentation option if pain control remains difficult.       Relevant Orders   DG Bone Density   Chronic nasal congestion    Endorses chronic R nasal congestion - will need to further evaluate next visit.       Aorto-iliac atherosclerosis ([place])    By lumbar films 04/2018           Meds ordered this encounter  Medications  . traMADol (ULTRAM) 50 MG tablet    Sig: Take 0.5-1 tablets (25-50 mg total) by mouth 2 (two) times daily as needed for moderate pain.    Dispense:  40 tablet    Refill:  0  . alendronate (FOSAMAX) 70 MG tablet    Sig: Take 1 tablet (70 mg total) by mouth every 7 (seven) days. Take with a full glass of water on an empty stomach.    Dispense:  4 tablet    Refill:  11   Orders Placed This Encounter  Procedures  . DG Bone Density    Standing Status:   Future    Number of Occurrences:   1    Standing Expiration Date:   07/25/2019    Order Specific Question:  Reason for Exam (SYMPTOM  OR DIAGNOSIS REQUIRED)    Answer:   T12 compression fracture    Order Specific Question:   Preferred imaging location?    Answer:   [address]    Follow up plan: No follow-ups on file.  [name], MD

## 2018-05-24 NOTE — Patient Instructions (Signed)
We will order bone density scan.  Let's add tramadol at night time 1/2-1 pill as needed.  Start fosamax 70mg  once weekly - take first thing upon waking up, stay upright after taking with large glass of water and no food for 30-60 min.  Ok to continue aleve as up to now, caution with GI upset.

## 2018-05-26 ENCOUNTER — Ambulatory Visit (INDEPENDENT_AMBULATORY_CARE_PROVIDER_SITE_OTHER)
Admission: RE | Admit: 2018-05-26 | Discharge: 2018-05-26 | Disposition: A | Discharge status: Home / Self Care | Payer: Medicare HMO | Source: Ambulatory Visit | Attending: Family Medicine | Admitting: Family Medicine

## 2018-05-26 DIAGNOSIS — S22080A Wedge compression fracture of T11-T12 vertebra, initial encounter for closed fracture: Secondary | ICD-10-CM | POA: Diagnosis not present

## 2018-05-27 DIAGNOSIS — I708 Atherosclerosis of other arteries: Secondary | ICD-10-CM | POA: Insufficient documentation

## 2018-05-27 DIAGNOSIS — R0981 Nasal congestion: Secondary | ICD-10-CM | POA: Insufficient documentation

## 2018-05-27 DIAGNOSIS — S22080A Wedge compression fracture of T11-T12 vertebra, initial encounter for closed fracture: Secondary | ICD-10-CM | POA: Insufficient documentation

## 2018-05-27 DIAGNOSIS — I700 Atherosclerosis of aorta: Secondary | ICD-10-CM | POA: Insufficient documentation

## 2018-05-27 DIAGNOSIS — Z8781 Personal history of (healed) traumatic fracture: Secondary | ICD-10-CM | POA: Insufficient documentation

## 2018-05-27 DIAGNOSIS — M419 Scoliosis, unspecified: Secondary | ICD-10-CM | POA: Insufficient documentation

## 2018-05-27 NOTE — Assessment & Plan Note (Signed)
By lumbar films 04/2018

## 2018-05-27 NOTE — Assessment & Plan Note (Signed)
Endorses chronic R nasal congestion - will need to further evaluate next visit.

## 2018-05-27 NOTE — Assessment & Plan Note (Addendum)
Presumed new, noted last month on lumbar imaging. Pain has not been well controlled with scheduled NSAID. He did have remote appearing prior T11 compression fracture (present on 2015 imaging). Reviewed options. Pt initially hesitant for stronger pain medication due to sedation, addiction concerns, but agrees to PRN tramadol 1/2-1 tab at night to start for better pain control.  Nasal calcitonin likely not good option given endorsed significant chronic nasal congestion.  Start bisphosphonate in h/o 2 compression fractures - reviewed correct administration of medication (once weekly on empty stomach, need to remain upright and fasting for 1 hour after ingestion), reviewed mechanism of action and possible adverse event of atypical hip fracture as well as warning signs to stop medication and notify us (hip pain, jaw pain).  Low threshold to start PPI given NSAID and bisphosphonate use. Will check DEXA to help guide long term management.  Discussed vertebral augmentation option if pain control remains difficult.

## 2018-06-04 ENCOUNTER — Encounter: Payer: Self-pay | Admitting: Family Medicine

## 2018-06-04 ENCOUNTER — Other Ambulatory Visit: Payer: Self-pay | Admitting: Family Medicine

## 2018-06-04 DIAGNOSIS — M810 Age-related osteoporosis without current pathological fracture: Secondary | ICD-10-CM

## 2018-06-04 DIAGNOSIS — M8080XD Other osteoporosis with current pathological fracture, unspecified site, subsequent encounter for fracture with routine healing: Secondary | ICD-10-CM

## 2018-06-04 HISTORY — DX: Age-related osteoporosis without current pathological fracture: M81.0

## 2018-06-07 ENCOUNTER — Other Ambulatory Visit (INDEPENDENT_AMBULATORY_CARE_PROVIDER_SITE_OTHER): Payer: Medicare HMO

## 2018-06-07 DIAGNOSIS — M8080XD Other osteoporosis with current pathological fracture, unspecified site, subsequent encounter for fracture with routine healing: Secondary | ICD-10-CM | POA: Diagnosis not present

## 2018-06-07 LAB — COMPREHENSIVE METABOLIC PANEL
ALBUMIN: 4.2 g/dL (ref 3.5–5.2)
ALT: 9 U/L (ref 0–53)
AST: 16 U/L (ref 0–37)
Alkaline Phosphatase: 111 U/L (ref 39–117)
BILIRUBIN TOTAL: 0.7 mg/dL (ref 0.2–1.2)
BUN: 24 mg/dL — AB (ref 6–23)
CHLORIDE: 110 meq/L (ref 96–112)
CO2: 26 meq/L (ref 19–32)
CREATININE: 1.2 mg/dL (ref 0.40–1.50)
Calcium: 9.1 mg/dL (ref 8.4–10.5)
GFR: 62.09 mL/min (ref >60.00)
Glucose, Bld: 110 mg/dL — ABNORMAL HIGH (ref 70–99)
Potassium: 4.1 mEq/L (ref 3.5–5.1)
SODIUM: 144 meq/L (ref 135–145)
Total Protein: 6.9 g/dL (ref 6.0–8.3)

## 2018-06-07 LAB — CBC WITH DIFFERENTIAL/PLATELET
BASOS PCT: 0.7 % (ref 0.0–3.0)
Basophils Absolute: 0.1 10*3/uL (ref 0.0–0.1)
EOS ABS: 0.1 10*3/uL (ref 0.0–0.7)
EOS PCT: 2 % (ref 0.0–5.0)
HEMATOCRIT: 44.3 % (ref 39.0–52.0)
Hemoglobin: 14.9 g/dL (ref 13.0–17.0)
Lymphocytes Relative: 21.4 % (ref 12.0–46.0)
Lymphs Abs: 1.5 10*3/uL (ref 0.7–4.0)
MCHC: 33.7 g/dL (ref 30.0–36.0)
MCV: 89.8 fl (ref 78.0–100.0)
Monocytes Absolute: 0.5 10*3/uL (ref 0.1–1.0)
Monocytes Relative: 7.4 % (ref 3.0–12.0)
NEUTROS PCT: 68.5 % (ref 43.0–77.0)
Neutro Abs: 4.9 10*3/uL (ref 1.4–7.7)
PLATELETS: 137 10*3/uL — AB (ref 150.0–400.0)
RBC: 4.93 Mil/uL (ref 4.22–5.81)
RDW: 13.4 % (ref 11.5–15.5)
WBC: 7.1 10*3/uL (ref 4.0–10.5)

## 2018-06-07 LAB — TESTOSTERONE: TESTOSTERONE: 361.74 ng/dL (ref 300.00–890.00)

## 2018-06-07 LAB — PHOSPHORUS: Phosphorus: 3.8 mg/dL (ref 2.3–4.6)

## 2018-06-07 LAB — VITAMIN D 25 HYDROXY (VIT D DEFICIENCY, FRACTURES): VITD: 29.46 ng/mL — ABNORMAL LOW (ref 30.00–100.00)

## 2018-06-07 LAB — TSH: TSH: 1.69 u[IU]/mL (ref 0.35–4.50)

## 2018-06-08 LAB — PARATHYROID HORMONE, INTACT (NO CA): PTH: 65 pg/mL — AB (ref 14–64)

## 2018-06-11 ENCOUNTER — Other Ambulatory Visit: Payer: Self-pay | Admitting: Family Medicine

## 2018-06-11 DIAGNOSIS — M8080XD Other osteoporosis with current pathological fracture, unspecified site, subsequent encounter for fracture with routine healing: Secondary | ICD-10-CM

## 2018-06-11 MED ORDER — VITAMIN D3 25 MCG (1000 UT) PO CAPS: 1.0000 | ORAL_CAPSULE | Freq: Every day | ORAL | Status: DC

## 2018-06-12 ENCOUNTER — Telehealth: Payer: Self-pay
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
## 2018-06-12 NOTE — Telephone Encounter (Signed)
Spoke to the patient, I gave him instructions on re collecting.He will re start his 24 hr test tomorrow

## 2018-06-14 DIAGNOSIS — M8080XD Other osteoporosis with current pathological fracture, unspecified site, subsequent encounter for fracture with routine healing: Secondary | ICD-10-CM | POA: Diagnosis not present

## 2018-06-14 NOTE — Addendum Note (Signed)
Addended by: Ellamae Sia on: 06/14/2018 12:54 PM   Modules accepted: Orders

## 2018-06-15 LAB — CALCIUM, URINE, 24 HOUR: Calcium, 24H Urine: 92 mg/24 h

## 2018-06-21 ENCOUNTER — Telehealth: Payer: Self-pay

## 2018-06-21 ENCOUNTER — Encounter: Payer: Self-pay | Admitting: Family Medicine

## 2018-06-21 DIAGNOSIS — M8080XD Other osteoporosis with current pathological fracture, unspecified site, subsequent encounter for fracture with routine healing: Secondary | ICD-10-CM

## 2018-06-21 DIAGNOSIS — E349 Endocrine disorder, unspecified: Secondary | ICD-10-CM | POA: Insufficient documentation

## 2018-06-21 DIAGNOSIS — E213 Hyperparathyroidism, unspecified: Secondary | ICD-10-CM | POA: Insufficient documentation

## 2018-06-21 NOTE — Telephone Encounter (Signed)
Stop fosamax. If pt interested we can see if insurance will cover prolia shots Q31mo - would come into office for this.

## 2018-06-21 NOTE — Telephone Encounter (Signed)
Pt states he took 1 dose of the Fosamax on an empty stomach but with a full glass of water as instructed.  But says med "tore his stomach up."  Asking for advice.

## 2018-06-21 NOTE — Telephone Encounter (Signed)
Left message on vm per dpr relaying Dr. Synthia Innocent instructions and message.  Asked pt to call back letting Dr. Darnell Level know if pt is interested in Prolia inj.

## 2018-06-22 NOTE — Telephone Encounter (Signed)
Pt wife called and said pt does want to go with Prolia. She is aware of needing to contact ins for approval. Please call once you have approval.

## 2018-06-28 NOTE — Telephone Encounter (Signed)
Information has been submitted to pts insurance for verification of benefits. Awaiting response for coverage  

## 2018-07-24 ENCOUNTER — Other Ambulatory Visit: Payer: Self-pay | Admitting: Family Medicine

## 2018-07-24 NOTE — Telephone Encounter (Signed)
Eprescribed.

## 2018-07-24 NOTE — Telephone Encounter (Signed)
Name of Medication: Clonazepam Name of Pharmacy: [facility] Mail order Last Fill or Written Date and Quantity: 12/08/17, #60 Last Office Visit and Type: 05/24/18, acute Next Office Visit and Type: 08/11/18, CPE Last Controlled Substance Agreement Date: 11/27/13 Last UDS: none

## 2018-08-07 ENCOUNTER — Ambulatory Visit: Payer: Medicare HMO

## 2018-08-09 ENCOUNTER — Ambulatory Visit (INDEPENDENT_AMBULATORY_CARE_PROVIDER_SITE_OTHER): Payer: Medicare HMO

## 2018-08-09 ENCOUNTER — Other Ambulatory Visit: Payer: Self-pay | Admitting: Family Medicine

## 2018-08-09 VITALS — BP 132/80 | HR 52 | Temp 97.9°F | Ht 66.5 in | Wt 153.0 lb

## 2018-08-09 DIAGNOSIS — R739 Hyperglycemia, unspecified: Secondary | ICD-10-CM

## 2018-08-09 DIAGNOSIS — Z0000 Encounter for general adult medical examination without abnormal findings: Secondary | ICD-10-CM

## 2018-08-09 DIAGNOSIS — E213 Hyperparathyroidism, unspecified: Secondary | ICD-10-CM

## 2018-08-09 DIAGNOSIS — N289 Disorder of kidney and ureter, unspecified: Secondary | ICD-10-CM

## 2018-08-09 DIAGNOSIS — R972 Elevated prostate specific antigen [PSA]: Secondary | ICD-10-CM

## 2018-08-09 LAB — LIPID PANEL
CHOLESTEROL: 208 mg/dL — AB (ref 0–200)
HDL: 55.5 mg/dL (ref >39.00)
LDL Cholesterol: 135 mg/dL — ABNORMAL HIGH (ref 0–99)
NonHDL: 152.11
TRIGLYCERIDES: 87 mg/dL (ref 0.0–149.0)
Total CHOL/HDL Ratio: 4
VLDL: 17.4 mg/dL (ref 0.0–40.0)

## 2018-08-09 LAB — HEMOGLOBIN A1C: Hgb A1c MFr Bld: 5.4 % (ref 4.6–6.5)

## 2018-08-09 NOTE — Progress Notes (Signed)
PCP notes:   Health maintenance:  Flu vaccine - addressed  Abnormal screenings:   Hearing - failed  Hearing Screening   125Hz  250Hz  500Hz  1000Hz  2000Hz  3000Hz  4000Hz  6000Hz  8000Hz   Right ear:   40 40 40  40    Left ear:   40 40 40  0     Patient concerns:   None  Nurse concerns:  None  Next PCP appt:   08/11/18 @ 1400

## 2018-08-09 NOTE — Patient Instructions (Signed)
[name] , Thank you for taking time to come for your Medicare Wellness Visit. I appreciate your ongoing commitment to your health goals. Please review the following plan we discussed and let me know if I can assist you in the future.   These are the goals we discussed: Goals    . Patient Stated     Starting 08/09/2018, I will continue to medications as prescribed.        This is a list of the screening recommended for you and due dates:  Health Maintenance  Topic Date Due  . Flu Shot  03/14/2019*  . DTaP/Tdap/Td vaccine (2 - Td) 01/18/2021  . Tetanus Vaccine  01/18/2021  . Pneumonia vaccines  Completed  *Topic was postponed. The date shown is not the original due date.   Preventive Care for Adults  A healthy lifestyle and preventive care can promote health and wellness. Preventive health guidelines for adults include the following key practices.  . A routine yearly physical is a good way to check with your health care provider about your health and preventive screening. It is a chance to share any concerns and updates on your health and to receive a thorough exam.  . Visit your dentist for a routine exam and preventive care every 6 months. Brush your teeth twice a day and floss once a day. Good oral hygiene prevents tooth decay and gum disease.  . The frequency of eye exams is based on your age, health, family medical history, use  of contact lenses, and other factors. Follow your health care provider's recommendations for frequency of eye exams.  . Eat a healthy diet. Foods like vegetables, fruits, whole grains, low-fat dairy products, and lean protein foods contain the nutrients you need without too many calories. Decrease your intake of foods high in solid fats, added sugars, and salt. Eat the right amount of calories for you. Get information about a proper diet from your health care provider, if necessary.  . Regular physical exercise is one of the most important things you can  do for your health. Most adults should get at least 150 minutes of moderate-intensity exercise (any activity that increases your heart rate and causes you to sweat) each week. In addition, most adults need muscle-strengthening exercises on 2 or more days a week.  Silver Sneakers may be a benefit available to you. To determine eligibility, you may visit the website: www.silversneakers.com or contact program at [phone] Mon-Fri between 8AM-8PM.   . Maintain a healthy weight. The body mass index (BMI) is a screening tool to identify possible weight problems. It provides an estimate of body fat based on height and weight. Your health care provider can find your BMI and can help you achieve or maintain a healthy weight.   For adults 20 years and older: ? A BMI below 18.5 is considered underweight. ? A BMI of 18.5 to 24.9 is normal. ? A BMI of 25 to 29.9 is considered overweight. ? A BMI of 30 and above is considered obese.   . Maintain normal blood lipids and cholesterol levels by exercising and minimizing your intake of saturated fat. Eat a balanced diet with plenty of fruit and vegetables. Blood tests for lipids and cholesterol should begin at age 37 and be repeated every 5 years. If your lipid or cholesterol levels are high, you are over 50, or you are at high risk for heart disease, you may need your cholesterol levels checked more frequently. Ongoing high lipid and  cholesterol levels should be treated with medicines if diet and exercise are not working.  . If you smoke, find out from your health care provider how to quit. If you do not use tobacco, please do not start.  . If you choose to drink alcohol, please do not consume more than 2 drinks per day. One drink is considered to be 12 ounces (355 mL) of beer, 5 ounces (148 mL) of wine, or 1.5 ounces (44 mL) of liquor.  . If you are 77-36 years old, ask your health care provider if you should take aspirin to prevent strokes.  . Use  sunscreen. Apply sunscreen liberally and repeatedly throughout the day. You should seek shade when your shadow is shorter than you. Protect yourself by wearing long sleeves, pants, a wide-brimmed hat, and sunglasses year round, whenever you are outdoors.  . Once a month, do a whole body skin exam, using a mirror to look at the skin on your back. Tell your health care provider of new moles, moles that have irregular borders, moles that are larger than a pencil eraser, or moles that have changed in shape or color.

## 2018-08-09 NOTE — Progress Notes (Signed)
Subjective:   [name] is a 77 y.o. male who presents for Medicare Annual/Subsequent preventive examination.  Review of Systems:  N/A Cardiac Risk Factors include: advanced age (>58men, >70 women);male gender     Objective:    Vitals: BP 132/80 (BP Location: Right Arm, Patient Position: Sitting, Cuff Size: Normal)   Pulse (!) 52   Temp 97.9 F (36.6 C) (Oral)   Ht 5' 6.5" (1.689 m) Comment: shoes  Wt 153 lb (69.4 kg)   SpO2 99%   BMI 24.32 kg/m   Body mass index is 24.32 kg/m.  Advanced Directives 08/09/2018 05/21/2016  Does Patient Have a Medical Advance Directive? Yes Yes  Type of Paramedic of [employer];Living will [place];Living will  Does patient want to make changes to medical advance directive? - No - Patient declined  Copy of [place] in Chart? Yes No - copy requested    Tobacco Social History   Tobacco Use  Smoking Status Never Smoker  Smokeless Tobacco Never Used     Counseling given: No   Clinical Intake:  Pre-visit preparation completed: Yes  Pain : No/denies pain Pain Score: 0-No pain     Nutritional Status: BMI of 19-24  Normal Nutritional Risks: None Diabetes: No  How often do you need to have someone help you when you read instructions, pamphlets, or other written materials from your doctor or pharmacy?: 1 - Never What is the last grade level you completed in school?: GED  Interpreter Needed?: No  Comments: pt lives with spouse Information entered by :: LPinson, LPN  Past Medical History:  Diagnosis Date  . BPH with urinary obstruction    sees urology Dr. [name]  . Carotid stenosis 07/20/2016   Minimal on [place] 07/2016. Monitor clinically.  . Depression with anxiety   . Elevated PSA 2014   sees urology - normal biopsies x 3  . HLD (hyperlipidemia)   . Left kidney mass 09/2014   on CT scan thought consistent with simple cyst on prior US  . Nasal obstruction    congenital R sided  . Obstructive pyelonephritis 09/2014   s/p hospitalization and treatment with levaquin  . OSA (obstructive sleep apnea)    pt denies  . Osteoporosis 06/04/2018   DEXA T score -37 at L spine  . Recurrent kidney stones 2014   extensive bilateral nephrolithiasis, sees urology [name]), hydrocodone prn  . Symptomatic PVCs    per prior cardiologist, on beta blocker  . Urge incontinence    thought overactive bladder   Past Surgical History:  Procedure Laterality Date  . CARDIAC CATHETERIZATION  03/2012   EF 45-50%, no LVH, + impaired LV relaxation, no PH, no valve abnormalities  . CARDIOVASCULAR STRESS TEST  03/2012   mild peri infarct ischemia in apical segment, decreased EF  . CATARACT EXTRACTION Right 01/2013  . COLONOSCOPY  02/2008   mod diverticulosis o/w WNL (2009)  . CYSTOSCOPY    . CYSTOSCOPY  10/2014   s/p stone removal, with stent removal [name])  . TONSILECTOMY, ADENOIDECTOMY, BILATERAL MYRINGOTOMY AND TUBES     removed as a child  . US ECHOCARDIOGRAPHY  03/2012   hypokinetic anterior wall, EF 45-50%, impaired relaxation pattern, mild LA dilation, mild-mod MR, mild-mod AR   Family History  Problem Relation Age of Onset  . Urolithiasis Daughter   . Kidney Stones Mother   . Diabetes Neg Hx   . Cancer Neg Hx   . CAD Neg  Hx   . Stroke Neg Hx   . Hypertension Neg Hx   . Prostate cancer Neg Hx   . Kidney cancer Neg Hx   . Bladder Cancer Neg Hx    Social History   Socioeconomic History  . Marital status: Married    Spouse name: Not on file  . Number of children: Not on file  . Years of education: Not on file  . Highest education level: Not on file  Occupational History  . Not on file  Social Needs  . Financial resource strain: Not on file  . Food insecurity:    Worry: Not on file    Inability: Not on file  . Transportation needs:    Medical: Not on file    Non-medical: Not on file  Tobacco Use  . Smoking status: Never Smoker  . Smokeless  tobacco: Never Used  Substance and Sexual Activity  . Alcohol use: No  . Drug use: No  . Sexual activity: Never  Lifestyle  . Physical activity:    Days per week: Not on file    Minutes per session: Not on file  . Stress: Not on file  Relationships  . Social connections:    Talks on phone: Not on file    Gets together: Not on file    Attends religious service: Not on file    Active member of club or organization: Not on file    Attends meetings of clubs or organizations: Not on file    Relationship status: Not on file  Other Topics Concern  . Not on file  Social History Narrative   Lives alone currently at apartment   1 grown daughter   Working on getting back together with wife - Elion Hocker (legally separated)   Occupation: retired, worked in Public relations account executive for 30 yrs   Edu: GED   Activity: walking some   Diet: good wtaer, fruits/vegetables daily.      Advanced directives: daughter is HCPOA for now    Outpatient Encounter Medications as of 08/09/2018  Medication Sig  . clonazePAM (KLONOPIN) 1 MG tablet TAKE 1 TABLET TWICE DAILY AS NEEDED  . metoprolol succinate (TOPROL-XL) 25 MG 24 hr tablet Take 0.5 tablets (12.5 mg total) by mouth daily.  . [DISCONTINUED] acetaminophen (TYLENOL) 500 MG tablet Take 2 tablets (1,000 mg total) by mouth every 8 (eight) hours as needed for moderate pain.  . [DISCONTINUED] alendronate (FOSAMAX) 70 MG tablet Take 1 tablet (70 mg total) by mouth every 7 (seven) days. Take with a full glass of water on an empty stomach.  . [DISCONTINUED] Cholecalciferol (VITAMIN D3) 1000 units CAPS Take 1 capsule (1,000 Units total) by mouth daily.  . [DISCONTINUED] Naproxen Sodium 220 MG CAPS Take by mouth.  . [DISCONTINUED] Omega-3 Fatty Acids (FISH OIL) 1000 MG CAPS Take 1 capsule by mouth 2 (two) times daily.  . [DISCONTINUED] traMADol (ULTRAM) 50 MG tablet Take 0.5-1 tablets (25-50 mg total) by mouth 2 (two) times daily as needed for moderate pain.   No  facility-administered encounter medications on file as of 08/09/2018.     Activities of Daily Living In your present state of health, do you have any difficulty performing the following activities: 08/09/2018  Hearing? Y  Comment tinnitus in both ears  Vision? N  Difficulty concentrating or making decisions? N  Walking or climbing stairs? N  Dressing or bathing? N  Doing errands, shopping? N  Preparing Food and eating ? N  Using the Toilet?  N  In the past six months, have you accidently leaked urine? N  Do you have problems with loss of bowel control? N  Managing your Medications? N  Managing your Finances? N  Housekeeping or managing your Housekeeping? N  Some recent data might be hidden    Patient Care Team: [name], MD as PCP - General (Family Medicine) Thelma Comp, [place] as Referring Physician (Optometry)   Assessment:   This is a routine wellness examination for [place].   Hearing Screening   125Hz  250Hz  500Hz  1000Hz  2000Hz  3000Hz  4000Hz  6000Hz  8000Hz   Right ear:   40 40 40  40    Left ear:   40 40 40  0    Vision Screening Comments: Vision exam in 2019 @ [place]   Exercise Activities and Dietary recommendations Current Exercise Habits: The patient does not participate in regular exercise at present, Exercise limited by: None identified  Goals    . Patient Stated     Starting 08/09/2018, I will continue to medications as prescribed.        Fall Risk Fall Risk  08/09/2018 08/05/2017 05/21/2016 02/19/2014  Falls in the past year? No No No No   Depression Screen PHQ 2/9 Scores 08/09/2018 08/05/2017 05/21/2016 02/19/2014  PHQ - 2 Score 0 1 0 0  PHQ- 9 Score 0 - - -    Cognitive Function MMSE - Mini Mental State Exam 08/09/2018 05/21/2016  Orientation to time 5 5  Orientation to Place 5 5  Registration 3 3  Attention/ Calculation 0 0  Recall 3 3  Language- name 2 objects 0 0  Language- repeat 1 1  Language- follow 3 step command 3 3    Language- read & follow direction 0 0  Write a sentence 0 0  Copy design 0 0  Total score 20 20     PLEASE NOTE: A Mini-Cog screen was completed. Maximum score is 20. A value of 0 denotes this part of [place] MMSE was not completed or the patient failed this part of the Mini-Cog screening.   Mini-Cog Screening Orientation to Time - Max 5 pts Orientation to Place - Max 5 pts Registration - Max 3 pts Recall - Max 3 pts Language Repeat - Max 1 pts Language Follow 3 Step Command - Max 3 pts     Immunization History  Administered Date(s) Administered  . Influenza Whole 10/13/2013  . Influenza,inj,Quad PF,6+ Mos 08/26/2015, 08/25/2016, 09/01/2017  . Pneumococcal Conjugate-13 02/19/2014  . Pneumococcal Polysaccharide-23 01/18/2011  . Tdap 01/18/2011  . Zoster 06/23/2015    Screening Tests Health Maintenance  Topic Date Due  . INFLUENZA VACCINE  03/14/2019 (Originally 07/13/2018)  . DTaP/Tdap/Td (2 - Td) 01/18/2021  . TETANUS/TDAP  01/18/2021  . PNA vac Low Risk Adult  Completed     Plan:     I have personally reviewed, addressed, and noted the following in the patient's chart:  A. Medical and social history B. Use of alcohol, tobacco or illicit drugs  C. Current medications and supplements D. Functional ability and status E.  Nutritional status F.  Physical activity G. Advance directives H. List of other physicians I.  Hospitalizations, surgeries, and ER visits in previous 12 months J.  [place] to include hearing, vision, cognitive, depression L. Referrals and appointments - none  In addition, I have reviewed and discussed with patient certain preventive protocols, quality metrics, and best practice recommendations. A written personalized care plan for preventive services as  well as general preventive health recommendations were provided to patient.  See attached scanned questionnaire for additional information.   Signed,   Lindell Noe, MHA, BS,  LPN Health Coach

## 2018-08-11 ENCOUNTER — Ambulatory Visit (INDEPENDENT_AMBULATORY_CARE_PROVIDER_SITE_OTHER): Payer: Medicare HMO | Admitting: Family Medicine

## 2018-08-11 ENCOUNTER — Encounter: Payer: Self-pay | Admitting: Family Medicine

## 2018-08-11 VITALS — BP 124/64 | HR 66 | Temp 98.0°F | Ht 66.5 in | Wt 153.0 lb

## 2018-08-11 DIAGNOSIS — Z1211 Encounter for screening for malignant neoplasm of colon: Secondary | ICD-10-CM | POA: Diagnosis not present

## 2018-08-11 DIAGNOSIS — E213 Hyperparathyroidism, unspecified: Secondary | ICD-10-CM | POA: Diagnosis not present

## 2018-08-11 DIAGNOSIS — R972 Elevated prostate specific antigen [PSA]: Secondary | ICD-10-CM

## 2018-08-11 DIAGNOSIS — M8080XD Other osteoporosis with current pathological fracture, unspecified site, subsequent encounter for fracture with routine healing: Secondary | ICD-10-CM

## 2018-08-11 DIAGNOSIS — Z23 Encounter for immunization: Secondary | ICD-10-CM

## 2018-08-11 DIAGNOSIS — R739 Hyperglycemia, unspecified: Secondary | ICD-10-CM

## 2018-08-11 DIAGNOSIS — I700 Atherosclerosis of aorta: Secondary | ICD-10-CM

## 2018-08-11 DIAGNOSIS — N289 Disorder of kidney and ureter, unspecified: Secondary | ICD-10-CM

## 2018-08-11 DIAGNOSIS — Z0000 Encounter for general adult medical examination without abnormal findings: Secondary | ICD-10-CM

## 2018-08-11 DIAGNOSIS — I493 Ventricular premature depolarization: Secondary | ICD-10-CM | POA: Diagnosis not present

## 2018-08-11 DIAGNOSIS — I708 Atherosclerosis of other arteries: Secondary | ICD-10-CM

## 2018-08-11 DIAGNOSIS — E785 Hyperlipidemia, unspecified: Secondary | ICD-10-CM | POA: Insufficient documentation

## 2018-08-11 DIAGNOSIS — S22080A Wedge compression fracture of T11-T12 vertebra, initial encounter for closed fracture: Secondary | ICD-10-CM

## 2018-08-11 DIAGNOSIS — Z7189 Other specified counseling: Secondary | ICD-10-CM

## 2018-08-11 DIAGNOSIS — I70299 Other atherosclerosis of native arteries of extremities, unspecified extremity: Secondary | ICD-10-CM

## 2018-08-11 MED ORDER — VITAMIN D3 25 MCG (1000 UT) PO CAPS
1.0000 | ORAL_CAPSULE | Freq: Every day | ORAL | Status: DC
Start: 2018-08-11 — End: 2019-08-21

## 2018-08-11 MED ORDER — CALCIUM 600 MG PO TABS: 600.0000 mg | ORAL_TABLET | Freq: Every day | ORAL | Status: DC

## 2018-08-11 NOTE — Assessment & Plan Note (Signed)
Preventative protocols reviewed and updated unless pt declined. Discussed healthy diet and lifestyle.  

## 2018-08-11 NOTE — Assessment & Plan Note (Signed)
A1c normal

## 2018-08-11 NOTE — Assessment & Plan Note (Addendum)
Not on statin or aspirin.  

## 2018-08-11 NOTE — Progress Notes (Signed)
BP 124/64 (BP Location: Left Arm, Patient Position: Sitting, Cuff Size: Normal)   Pulse 66   Temp 98 F (36.7 C) (Oral)   Ht 5' 6.5" (1.689 m)   Wt 153 lb (69.4 kg)   SpO2 98%   BMI 24.32 kg/m    CC: CPE Subjective:    Patient ID: [name], male    DOB: 1941-12-10, 77 y.o.   MRN: [id number]  HPI: [name] is a 77 y.o. male presenting on 08/11/2018 for Annual Exam (Pt 2.)   See prior note for details - recent T12 compression fracture - fosamax caused GI upset after 1 dose. Pt decided to treat with prolia - still awaiting approval. Back finally healed - now off pain medicine. May have had rickets growing up.   DEXA T score -3.7 at L spine (05/2018)  Uses klonopin PRN  Saw Katha Cabal Wed for medicare wellness visit. Note reviewed.    Preventative: Colon cancer screening - mod diverticulosis o/w WNL (2009). Discussed options - he would like iFOB.  Prostate cancer screening - followed regularly by urology (Dr [name]) for high PSA with 3 normal biopsies.  Flu shot yearly  Pneumovax 01/2011, prevnar 2015 Tdap 01/2011  zostavax - 2016 shingrix - discussed Advanced directive: scanned 04/2018. HCPOA are daughter Dreama Saa and granddaughter Ebony Hail. Wants life prolonging measures as reasonably possible within limits of generally accepted health standards.  Seat belt use discussed Sunscreen use discussed. No changing moles on skin Non smoker Alcohol - none Dentist yearly  Eye exam - yearly   Lives with wife [name])  1 grown daughter Occupation: retired, worked in Public relations account executive for 30 yrs Edu: GED Activity: no regular exercise Diet: good water, fruits/vegetables daily.  Relevant past medical, surgical, family and social history reviewed and updated as indicated. Interim medical history since our last visit reviewed. Allergies and medications reviewed and updated. Outpatient Medications Prior to Visit  Medication Sig Dispense Refill  . b complex vitamins capsule  Take 1 capsule by mouth daily.    . clonazePAM (KLONOPIN) 1 MG tablet TAKE 1 TABLET TWICE DAILY AS NEEDED 60 tablet 0  . metoprolol succinate (TOPROL-XL) 25 MG 24 hr tablet Take 0.5 tablets (12.5 mg total) by mouth daily. 45 tablet 10   No facility-administered medications prior to visit.      Per HPI unless specifically indicated in ROS section below Review of Systems  Constitutional: Negative for activity change, appetite change, chills, fatigue, fever and unexpected weight change.  HENT: Negative for hearing loss.   Eyes: Negative for visual disturbance.  Respiratory: Negative for cough, chest tightness, shortness of breath and wheezing.   Cardiovascular: Negative for chest pain, palpitations and leg swelling.  Gastrointestinal: Negative for abdominal distention, abdominal pain, blood in stool, constipation, diarrhea, nausea and vomiting.  Genitourinary: Negative for difficulty urinating and hematuria.  Musculoskeletal: Negative for arthralgias, myalgias and neck pain.  Skin: Negative for rash.  Neurological: Negative for dizziness, seizures, syncope and headaches.  Hematological: Negative for adenopathy. Does not bruise/bleed easily.  Psychiatric/Behavioral: Negative for dysphoric mood. The patient is not nervous/anxious.        Objective:    BP 124/64 (BP Location: Left Arm, Patient Position: Sitting, Cuff Size: Normal)   Pulse 66   Temp 98 F (36.7 C) (Oral)   Ht 5' 6.5" (1.689 m)   Wt 153 lb (69.4 kg)   SpO2 98%   BMI 24.32 kg/m   Wt Readings from Last 3 Encounters:  08/11/18 153  lb (69.4 kg)  08/09/18 153 lb (69.4 kg)  05/24/18 155 lb 8 oz (70.5 kg)    Physical Exam  Constitutional: He is oriented to person, place, and time. He appears well-developed and well-nourished. No distress.  HENT:  Head: Normocephalic and atraumatic.  Right Ear: Hearing, tympanic membrane, external ear and ear canal normal.  Left Ear: Hearing, tympanic membrane, external ear and ear canal  normal.  Nose: Nose normal.  Mouth/Throat: Uvula is midline, oropharynx is clear and moist and mucous membranes are normal. No oropharyngeal exudate, posterior oropharyngeal edema or posterior oropharyngeal erythema.  Eyes: Pupils are equal, round, and reactive to light. Conjunctivae and EOM are normal. No scleral icterus.  Neck: Normal range of motion. Neck supple. Carotid bruit is not present. No thyromegaly present.  Cardiovascular: Normal rate, regular rhythm, normal heart sounds and intact distal pulses.  No murmur heard. Pulses:      Radial pulses are 2+ on the right side, and 2+ on the left side.  Pulmonary/Chest: Effort normal and breath sounds normal. No respiratory distress. He has no wheezes. He has no rales.  Abdominal: Soft. Bowel sounds are normal. He exhibits no distension and no mass. There is no tenderness. There is no rebound and no guarding.  Musculoskeletal: Normal range of motion. He exhibits no edema.  Lymphadenopathy:    He has no cervical adenopathy.  Neurological: He is alert and oriented to person, place, and time.  CN grossly intact, station and gait intact  Skin: Skin is warm and dry. No rash noted.  Psychiatric: He has a normal mood and affect. His behavior is normal. Judgment and thought content normal.  Nursing note and vitals reviewed.  Results for orders placed or performed in visit on 08/09/18  Hemoglobin A1c  Result Value Ref Range   Hgb A1c MFr Bld 5.4 4.6 - 6.5 %  Lipid panel  Result Value Ref Range   Cholesterol 208 (H) 0 - 200 mg/dL   Triglycerides 87.0 0.0 - 149.0 mg/dL   HDL 55.50 >39.00 mg/dL   VLDL 17.4 0.0 - 40.0 mg/dL   LDL Cholesterol 135 (H) 0 - 99 mg/dL   Total CHOL/HDL Ratio 4    NonHDL 152.11    Lab Results  Component Value Date   PTH 65 (H) 06/07/2018   CALCIUM 9.1 06/07/2018   PHOS 3.8 06/07/2018       Assessment & Plan:   Problem List Items Addressed This Visit    Symptomatic PVCs    Ectopy on exam - managed with low  dose toprol XL      Renal insufficiency    Stable on latest check.      Osteoporosis    Reviewed recent severe osteoporosis diagnosis associated with compression fracture. He did not tolerate oral fosamax. We are awaiting coverage determination for prolia coverage.  Reviewed importance of weight bearing exercise as well as regular calcium and vit D intake - rec start daily OTC supplementation      Relevant Medications   Cholecalciferol (VITAMIN D3) 1000 units CAPS   calcium carbonate (OS-CAL) 600 MG tablet   Hyperparathyroidism ([place])    Minimal. Will continue to monitor.       Hyperglycemia    A1c normal.       HLD (hyperlipidemia)    Mild, not on statin.  The 10-year ASCVD risk score Mikey Bussing DC Jr., et al., 2013) is: 33.3%   Values used to calculate the score:     Age: 66 years  Sex: Male     Is Non-Hispanic African American: No     Diabetic: No     Tobacco smoker: No     Systolic Blood Pressure: 409 mmHg     Is BP treated: Yes     HDL Cholesterol: 55.5 mg/dL     Total Cholesterol: 208 mg/dL       Health maintenance examination - Primary    Preventative protocols reviewed and updated unless pt declined. Discussed healthy diet and lifestyle.       Elevated PSA    Appreciate uro care.       Compression fracture of T12 vertebra (HCC)    This has healed well.  No longer taking pain medication.  Discussed osteoporosis management.      Aorto-iliac atherosclerosis ([place])    Not on statin or aspirin.       Advanced care planning/counseling discussion    Advanced directive: scanned 04/2018. HCPOA are daughter Dreama Saa and granddaughter Ebony Hail. Wants life prolonging measures as reasonably possible within limits of generally accepted health standards.        Other Visit Diagnoses    Special screening for malignant neoplasms, colon       Relevant Orders   Fecal occult blood, imunochemical   Need for influenza vaccination       Relevant Orders   Flu  Vaccine QUAD 36+ mos IM (Completed)       Meds ordered this encounter  Medications  . Cholecalciferol (VITAMIN D3) 1000 units CAPS    Sig: Take 1 capsule (1,000 Units total) by mouth daily.    Dispense:  30 capsule  . calcium carbonate (OS-CAL) 600 MG tablet    Sig: Take 1 tablet (600 mg total) by mouth daily.    Dispense:  30 tablet   Orders Placed This Encounter  Procedures  . Fecal occult blood, imunochemical    Standing Status:   Future    Standing Expiration Date:   08/12/2019  . Flu Vaccine QUAD 36+ mos IM    Follow up plan: Return in about 6 months (around 02/10/2019) for follow up visit.  [name], MD

## 2018-08-11 NOTE — Assessment & Plan Note (Signed)
Appreciate uro care 

## 2018-08-11 NOTE — Assessment & Plan Note (Signed)
Advanced directive: scanned 04/2018. HCPOA are daughter Kimberly Kelly and granddaughter Erin Heaney. Wants life prolonging measures as reasonably possible within limits of generally accepted health standards.  ?

## 2018-08-11 NOTE — Patient Instructions (Addendum)
You did have osteoporosis - start taking vitamin D 1000 units and calcium tablet 668m daily (both over the counter).  Work on wLockheed Martinbearing exercises - try to do some walking daily.  Pass by lab to pick up stool kit. Flu shot today If interested, check with pharmacy about new 2 shot shingles series (shingrix).   Health Maintenance, Male A healthy lifestyle and preventive care is important for your health and wellness. Ask your health care provider about what schedule of regular examinations is right for you. What should I know about weight and diet? Eat a Healthy Diet  Eat plenty of vegetables, fruits, whole grains, low-fat dairy products, and lean protein.  Do not eat a lot of foods high in solid fats, added sugars, or salt.  Maintain a Healthy Weight Regular exercise can help you achieve or maintain a healthy weight. You should:  Do at least 150 minutes of exercise each week. The exercise should increase your heart rate and make you sweat (moderate-intensity exercise).  Do strength-training exercises at least twice a week.  Watch Your Levels of Cholesterol and Blood Lipids  Have your blood tested for lipids and cholesterol every 5 years starting at 79years of age. If you are at high risk for heart disease, you should start having your blood tested when you are 79years old. You may need to have your cholesterol levels checked more often if: ? Your lipid or cholesterol levels are high. ? You are older than 79years of age. ? You are at high risk for heart disease.  What should I know about cancer screening? Many types of cancers can be detected early and may often be prevented. Lung Cancer  You should be screened every year for lung cancer if: ? You are a current smoker who has smoked for at least 30 years. ? You are a former smoker who has quit within the past 15 years.  Talk to your health care provider about your screening options, when you should start screening, and how  often you should be screened.  Colorectal Cancer  Routine colorectal cancer screening usually begins at 79years of age and should be repeated every 5-10 years until you are 79years old. You may need to be screened more often if early forms of precancerous polyps or small growths are found. Your health care provider may recommend screening at an earlier age if you have risk factors for colon cancer.  Your health care provider may recommend using home test kits to check for hidden blood in the stool.  A small camera at the end of a tube can be used to examine your colon (sigmoidoscopy or colonoscopy). This checks for the earliest forms of colorectal cancer.  Prostate and Testicular Cancer  Depending on your age and overall health, your health care provider may do certain tests to screen for prostate and testicular cancer.  Talk to your health care provider about any symptoms or concerns you have about testicular or prostate cancer.  Skin Cancer  Check your skin from head to toe regularly.  Tell your health care provider about any new moles or changes in moles, especially if: ? There is a change in a mole's size, shape, or color. ? You have a mole that is larger than a pencil eraser.  Always use sunscreen. Apply sunscreen liberally and repeat throughout the day.  Protect yourself by wearing long sleeves, pants, a wide-brimmed hat, and sunglasses when outside.  What should I know  about heart disease, diabetes, and high blood pressure?  If you are 59-10 years of age, have your blood pressure checked every 3-5 years. If you are 67 years of age or older, have your blood pressure checked every year. You should have your blood pressure measured twice-once when you are at a hospital or clinic, and once when you are not at a hospital or clinic. Record the average of the two measurements. To check your blood pressure when you are not at a hospital or clinic, you can use: ? An automated blood  pressure machine at a pharmacy. ? A home blood pressure monitor.  Talk to your health care provider about your target blood pressure.  If you are between 73-23 years old, ask your health care provider if you should take aspirin to prevent heart disease.  Have regular diabetes screenings by checking your fasting blood sugar level. ? If you are at a normal weight and have a low risk for diabetes, have this test once every three years after the age of 67. ? If you are overweight and have a high risk for diabetes, consider being tested at a younger age or more often.  A one-time screening for abdominal aortic aneurysm (AAA) by ultrasound is recommended for men aged 39-75 years who are current or former smokers. What should I know about preventing infection? Hepatitis B If you have a higher risk for hepatitis B, you should be screened for this virus. Talk with your health care provider to find out if you are at risk for hepatitis B infection. Hepatitis C Blood testing is recommended for:  Everyone born from [birth year] through 1967.  Anyone with known risk factors for hepatitis C.  Sexually Transmitted Diseases (STDs)  You should be screened each year for STDs including gonorrhea and chlamydia if: ? You are sexually active and are younger than 79 years of age. ? You are older than 79 years of age and your health care provider tells you that you are at risk for this type of infection. ? Your sexual activity has changed since you were last screened and you are at an increased risk for chlamydia or gonorrhea. Ask your health care provider if you are at risk.  Talk with your health care provider about whether you are at high risk of being infected with HIV. Your health care provider may recommend a prescription medicine to help prevent HIV infection.  What else can I do?  Schedule regular health, dental, and eye exams.  Stay current with your vaccines (immunizations).  Do not use any tobacco  products, such as cigarettes, chewing tobacco, and e-cigarettes. If you need help quitting, ask your health care provider.  Limit alcohol intake to no more than 2 drinks per day. One drink equals 12 ounces of beer, 5 ounces of wine, or 1 ounces of hard liquor.  Do not use street drugs.  Do not share needles.  Ask your health care provider for help if you need support or information about quitting drugs.  Tell your health care provider if you often feel depressed.  Tell your health care provider if you have ever been abused or do not feel safe at home. This information is not intended to replace advice given to you by your health care provider. Make sure you discuss any questions you have with your health care provider. Document Released: 05/27/2008 Document Revised: 07/28/2016 Document Reviewed: 09/02/2015 Elsevier Interactive Patient Education  [employer].

## 2018-08-11 NOTE — Assessment & Plan Note (Signed)
Stable on latest check.

## 2018-08-11 NOTE — ACP (Advance Care Planning) (Signed)
Advanced directive: scanned 04/2018. HCPOA are daughter Kimberly Kelly and granddaughter Erin Heaney. Wants life prolonging measures as reasonably possible within limits of generally accepted health standards.  ?

## 2018-08-11 NOTE — Assessment & Plan Note (Signed)
This has healed well.  No longer taking pain medication.  Discussed osteoporosis management.

## 2018-08-11 NOTE — Assessment & Plan Note (Signed)
Minimal. Will continue to monitor.  ?

## 2018-08-11 NOTE — Assessment & Plan Note (Addendum)
Reviewed recent severe osteoporosis diagnosis associated with compression fracture. He did not tolerate oral fosamax. We are awaiting coverage determination for prolia coverage.  Reviewed importance of weight bearing exercise as well as regular calcium and vit D intake - rec start daily OTC supplementation

## 2018-08-11 NOTE — Assessment & Plan Note (Signed)
Mild, not on statin.  The 10-year ASCVD risk score Mikey Bussing DC Brooke Bonito., et al., 2013) is: 33.3%   Values used to calculate the score:     Age: 77 years     Sex: Male     Is Non-Hispanic African American: No     Diabetic: No     Tobacco smoker: No     Systolic Blood Pressure: 171 mmHg     Is BP treated: Yes     HDL Cholesterol: 55.5 mg/dL     Total Cholesterol: 208 mg/dL

## 2018-08-11 NOTE — Assessment & Plan Note (Signed)
Ectopy on exam - managed with low dose toprol XL

## 2018-08-18 NOTE — Telephone Encounter (Signed)
Attempted to contact pt; vm not avail to leave message

## 2018-08-18 NOTE — Telephone Encounter (Signed)
Verification of benefits have been processed and an approval has been received for pts prolia injection. Pts estimated cost are appx $250. This is only an estimate and cannot be confirmed until benefits are paid. Please advise pt and schedule if needed. If scheduled, once the injection is received, pls contact me back with the date it was received so that I am able to update prolia folder. thanks  

## 2018-08-21 ENCOUNTER — Telehealth: Payer: Self-pay

## 2018-08-21 NOTE — Telephone Encounter (Signed)
I do not see any telephone encounters or result notes showing someone from our office was trying to contact the pt.

## 2018-08-21 NOTE — Telephone Encounter (Signed)
Lm on pts vm requesting a call back 

## 2018-08-21 NOTE — Telephone Encounter (Signed)
Copied from [place] [phone]. Topic: General - Other >> Aug 21, 2018 10:50 AM Synthia Innocent wrote: Reason for CRM: Returning call

## 2018-08-22 ENCOUNTER — Other Ambulatory Visit (INDEPENDENT_AMBULATORY_CARE_PROVIDER_SITE_OTHER): Payer: Medicare HMO

## 2018-08-22 DIAGNOSIS — Z1211 Encounter for screening for malignant neoplasm of colon: Secondary | ICD-10-CM | POA: Diagnosis not present

## 2018-08-22 LAB — FECAL OCCULT BLOOD, IMMUNOCHEMICAL: Fecal Occult Bld: NEGATIVE

## 2018-08-22 LAB — FECAL OCCULT BLOOD, GUAIAC: FECAL OCCULT BLD: NEGATIVE

## 2018-08-24 ENCOUNTER — Encounter: Payer: Self-pay | Admitting: Family Medicine

## 2018-08-25 NOTE — Telephone Encounter (Signed)
Spoke to pt and advised of out of pocket expenses. Pt states he "will think about it" and contact the office back. He was under the impression that this would be a one time injection, versus one he is to receive every 50mos.

## 2018-08-28 NOTE — Telephone Encounter (Signed)
Daughter calling back to advise that it is [place] that had called, she thinks this is concerning the prolia injection that Dr Darnell Level had recommended. Pt told her his part would be a little over $200. [name] states she is going to do some research on this med, and will call back if they decide this is something pt would like to proceed with. She will call back when they decide.

## 2018-08-30 ENCOUNTER — Other Ambulatory Visit: Payer: Medicare HMO

## 2018-08-30 DIAGNOSIS — R972 Elevated prostate specific antigen [PSA]: Secondary | ICD-10-CM

## 2018-08-31 LAB — PSA: Prostate Specific Ag, Serum: 21.2 ng/mL — ABNORMAL HIGH (ref 0.0–4.0)

## 2018-09-06 ENCOUNTER — Ambulatory Visit (INDEPENDENT_AMBULATORY_CARE_PROVIDER_SITE_OTHER): Payer: Medicare HMO | Admitting: Urology

## 2018-09-06 ENCOUNTER — Encounter: Payer: Self-pay | Admitting: Urology

## 2018-09-06 VITALS — BP 151/84 | HR 67 | Ht 69.0 in | Wt 152.7 lb

## 2018-09-06 DIAGNOSIS — N138 Other obstructive and reflux uropathy: Secondary | ICD-10-CM

## 2018-09-06 DIAGNOSIS — I10 Essential (primary) hypertension: Secondary | ICD-10-CM | POA: Insufficient documentation

## 2018-09-06 DIAGNOSIS — R972 Elevated prostate specific antigen [PSA]: Secondary | ICD-10-CM | POA: Diagnosis not present

## 2018-09-06 DIAGNOSIS — N3941 Urge incontinence: Secondary | ICD-10-CM | POA: Diagnosis not present

## 2018-09-06 DIAGNOSIS — N200 Calculus of kidney: Secondary | ICD-10-CM

## 2018-09-06 DIAGNOSIS — N401 Enlarged prostate with lower urinary tract symptoms: Secondary | ICD-10-CM | POA: Diagnosis not present

## 2018-09-06 DIAGNOSIS — D649 Anemia, unspecified: Secondary | ICD-10-CM | POA: Insufficient documentation

## 2018-09-06 NOTE — Progress Notes (Signed)
09/06/2018 1:31 PM   [name] 04/21/1941 [id number]  Referring provider: [name], MD [address] [place], [place] [address]  Chief Complaint  Patient presents with  . Follow-up    HPI: Patient is a 77 year old Caucasian male with recurrent kidney stones, BPH with LUTS, elevated PSA and urge incontinence who presents today for 6 month follow-up.  Recurrent kidney stones Patient has an occasional episode of intermittent flank pain.  He is s/p left URS/LL/ureteral stent placement with stent removal in 10/2014, right ESWL in 12/2014.  Metabolic work up indicated a low volume of urine at 1.47 L and low urinary citrate. Urinary pH 5.56.  His 24 hour urine magnesium was borderline low.  No other obvious metabolic derangements. His creatinine was 1.13.  He reports that he has not been diligent in increasing his water intake.  KUB on 09/08/2017 again noted bilateral nephrolithiasis.  He is still reluctant to increase his water intake.    BPH WITH LUTS His IPSS score today is 2, which is mild  lower urinary tract symptomatology.  He is mixed with his quality life due to his urinary symptoms.  His previous I PSS score was 7/1.  His previous PVR was 98 mL.  His major complaints today are nocturia, incontinence, intermittency and a weak stream.   His previous IPSS score was 14/2.  His PVR is 11 mL.  He has had these symptoms for several years.  He denies any dysuria, hematuria or suprapubic pain.  Prostate enlarged on transabdominal ultrasound volume 5.1 x 5.5 x 6.6 cm with median lobe.  He also denies any recent fevers, chills, nausea or vomiting.  He does not have a family history of PCa. IPSS    Row Name 09/06/18 1300         International Prostate Symptom Score   How often have you had the sensation of not emptying your bladder?  Not at All     How often have you had to urinate less than every two hours?  Not at All     How often have you found you stopped and started  again several times when you urinated?  Not at All     How often have you found it difficult to postpone urination?  Not at All     How often have you had a weak urinary stream?  Not at All     How often have you had to strain to start urination?  Not at All     How many times did you typically get up at night to urinate?  2 Times     Total IPSS Score  2       Quality of Life due to urinary symptoms   If you were to spend the rest of your life with your urinary condition just the way it is now how would you feel about that?  Mixed        Score:  1-7 Mild 8-19 Moderate 20-35 Severe  Elevated PSA Patient has had a history of s/p negative biopsy x 3. Awaiting records after numerous requests from patient and [name] office. Most recent PSA very elevated (19) but likely secondary to recent endoscopic manipulation. Repeat PSA 15 ng/dL on 12/15, and 13.3 on 03/13/15.  Now, PSA was 19.23 on 07/20/2016.  Prostate enlarged on transabdominal ultrasound volume 5.1 x 5.5 x 6.6 cm..  A free and total PSA noted a PSA of 15.2 with a probability of 9%  of having prostate cancer.  He does not want to undergo another biopsy.  Current PSA is 21.2 ng/mL on 08/30/2018  Urge incontinence He is no longer taking the Vesicare.  He is not finding his urge incontinence bothersome at this time.   PMH: Past Medical History:  Diagnosis Date  . BPH with urinary obstruction    sees urology Dr. [name]  . Carotid stenosis 07/20/2016   Minimal on [place] 07/2016. Monitor clinically.  . Depression with anxiety   . Elevated PSA 2014   sees urology - normal biopsies x 3  . HLD (hyperlipidemia)   . Left kidney mass 09/2014   on CT scan thought consistent with simple cyst on prior US  . Nasal obstruction    congenital R sided  . Obstructive pyelonephritis 09/2014   s/p hospitalization and treatment with levaquin  . OSA (obstructive sleep apnea)    pt denies  . Osteoporosis 06/04/2018   DEXA T score -37 at L spine    . Recurrent kidney stones 2014   extensive bilateral nephrolithiasis, sees urology [name]), hydrocodone prn  . Symptomatic PVCs    per prior cardiologist, on beta blocker  . Urge incontinence    thought overactive bladder    Surgical History: Past Surgical History:  Procedure Laterality Date  . CARDIAC CATHETERIZATION  03/2012   EF 45-50%, no LVH, + impaired LV relaxation, no PH, no valve abnormalities  . CARDIOVASCULAR STRESS TEST  03/2012   mild peri infarct ischemia in apical segment, decreased EF  . CATARACT EXTRACTION Right 01/2013  . COLONOSCOPY  02/2008   mod diverticulosis o/w WNL (2009)  . CYSTOSCOPY    . CYSTOSCOPY  10/2014   s/p stone removal, with stent removal [name])  . TONSILECTOMY, ADENOIDECTOMY, BILATERAL MYRINGOTOMY AND TUBES     removed as a child  . US ECHOCARDIOGRAPHY  03/2012   hypokinetic anterior wall, EF 45-50%, impaired relaxation pattern, mild LA dilation, mild-mod MR, mild-mod AR    Home Medications:  Allergies as of 09/06/2018   No Known Allergies     Medication List        Accurate as of 09/06/18  1:31 PM. Always use your most recent med list.          b complex vitamins capsule Take 1 capsule by mouth daily.   calcium carbonate 600 MG tablet Commonly known as:  OS-CAL Take 1 tablet (600 mg total) by mouth daily.   clonazePAM 1 MG tablet Commonly known as:  KLONOPIN TAKE 1 TABLET TWICE DAILY AS NEEDED   metoprolol succinate 25 MG 24 hr tablet Commonly known as:  TOPROL-XL Take 0.5 tablets (12.5 mg total) by mouth daily.   Vitamin D3 1000 units Caps Take 1 capsule (1,000 Units total) by mouth daily.       Allergies: No Known Allergies  Family History: Family History  Problem Relation Age of Onset  . Urolithiasis Daughter   . Kidney Stones Mother   . Diabetes Neg Hx   . Cancer Neg Hx   . CAD Neg Hx   . Stroke Neg Hx   . Hypertension Neg Hx   . Prostate cancer Neg Hx   . Kidney cancer Neg Hx   . Bladder Cancer Neg Hx      Social History:  reports that he has never smoked. He has never used smokeless tobacco. He reports that he does not drink alcohol or use drugs.  ROS: UROLOGY Frequent Urination?: No Hard to postpone urination?: No  Burning/pain with urination?: No Get up at night to urinate?: Yes Leakage of urine?: Yes Urine stream starts and stops?: Yes Trouble starting stream?: No Do you have to strain to urinate?: No Blood in urine?: No Urinary tract infection?: No Sexually transmitted disease?: No Injury to kidneys or bladder?: No Painful intercourse?: No Weak stream?: Yes Erection problems?: No Penile pain?: No  Gastrointestinal Nausea?: No Vomiting?: No Indigestion/heartburn?: No Diarrhea?: No Constipation?: No  Constitutional Fever: No Night sweats?: No Weight loss?: No Fatigue?: No  Skin Skin rash/lesions?: No Itching?: No  Eyes Blurred vision?: No Double vision?: No  Ears/Nose/Throat Sore throat?: No Sinus problems?: Yes  Hematologic/Lymphatic Swollen glands?: No Easy bruising?: No  Cardiovascular Leg swelling?: No Chest pain?: No  Respiratory Cough?: No Shortness of breath?: No  Endocrine Excessive thirst?: No  Musculoskeletal Back pain?: No Joint pain?: No  Neurological Headaches?: No Dizziness?: No  Psychologic Depression?: No Anxiety?: No  Physical Exam: BP (!) 151/84 (BP Location: Left Arm, Patient Position: Sitting, Cuff Size: Normal)   Pulse 67   Ht 5\' 9"  (1.753 m)   Wt 152 lb 11.2 oz (69.3 kg)   BMI 22.55 kg/m   Constitutional: Well nourished. Alert and oriented, No acute distress. HEENT: [place] AT, moist mucus membranes. Trachea midline, no masses. Cardiovascular: No clubbing, cyanosis, or edema. Respiratory: Normal respiratory effort, no increased work of breathing. GI: Abdomen is soft, non tender, non distended, no abdominal masses. Liver and spleen not palpable.  No hernias appreciated.  Stool sample for occult testing is not  indicated.   GU: No CVA tenderness.  No bladder fullness or masses.  Patient with uncircumcised phallus.   Foreskin easily retracted Urethral meatus is patent.  No penile discharge. No penile lesions or rashes. Scrotum without lesions, cysts, rashes and/or edema.  Testicles are located scrotally bilaterally. No masses are appreciated in the testicles. Left and right epididymis are normal. Rectal: Patient with  normal sphincter tone. Anus and perineum without scarring or rashes. No rectal masses are appreciated. Prostate is massive, could not palpate the entire gland due to size, no nodules are appreciated.  Skin: No rashes, bruises or suspicious lesions. Lymph: No cervical or inguinal adenopathy. Neurologic: Grossly intact, no focal deficits, moving all 4 extremities. Psychiatric: Normal mood and affect.    Laboratory Data: Lab Results  Component Value Date   CREATININE 1.20 06/07/2018   PSA History  16.6 ng/mL on 03/01/2017  16.5 ng/mL on 09/01/2017  15.7 ng.mL on 03/06/2018  21.2 ng/mL on 08/30/2018   Lab Results  Component Value Date   PSA 19.23 (H) 07/20/2016   PSA 14.30 (H) 02/12/2014        Component Value Date/Time   CHOL 208 (H) 08/09/2018 1245   HDL 55.50 08/09/2018 1245   CHOLHDL 4 08/09/2018 1245   VLDL 17.4 08/09/2018 1245   LDLCALC 135 (H) 08/09/2018 1245    Lab Results  Component Value Date   AST 16 06/07/2018   Lab Results  Component Value Date   ALT 9 06/07/2018   I have reviewed labs   Assessment & Plan:    1. Recurrent nephrolithiasis Patient with bilateral stones on 08/2017 KUB bilateral nephrolithiasis  Recent creatinine 1.20 on 05/2018 -  At baseline Recommended KUB, but patient declined  Advised to contact our office or seek treatment in the ED if becomes febrile or pain/ vomiting are difficult control in order to arrange for emergent/urgent intervention  2. BPH with LUTS IPSS score is 2/3, it is  improving Continue conservative  management, avoiding bladder irritants and timed voiding's   3. Elevated PSA Discussed with the patient the concern for a possible prostate cancer with the elevation of the PSA and as he has undergone several biopsies I have offered him the choice of undergoing an MRI of the prostate explaining to him that this would help [place] discern whether or not the PSA elevation is due to a high-grade prostate cancer, which we would want to address, or a low-grade prostate cancer, which we would just follow and would not need treatment He is agreeable to undergo an MRI at this time   4. Urge incontinence Managing conservatively - wife is disabled and needs one on one care   Return for MRI prostate report .  These notes generated with voice recognition software. I apologize for typographical errors.  [name], [name]  [facility] [address] [place], [place] [address] [phone]

## 2018-09-10 NOTE — Progress Notes (Signed)
I reviewed health advisor's note, was available for consultation, and agree with documentation and plan.  

## 2018-10-19 DIAGNOSIS — H40003 Preglaucoma, unspecified, bilateral: Secondary | ICD-10-CM | POA: Diagnosis not present

## 2018-10-23 ENCOUNTER — Telehealth: Payer: Self-pay | Admitting: Urology

## 2018-10-23 NOTE — Telephone Encounter (Signed)
FYI Patient cx said he had to much going on right now. His brother in law had a heart attack and he just could not do it right  Now. He would call back later to reschd.   Thanks, Sharyn Lull

## 2018-10-24 ENCOUNTER — Other Ambulatory Visit: Payer: Medicare HMO

## 2018-12-22 ENCOUNTER — Other Ambulatory Visit: Payer: Self-pay | Admitting: Urology

## 2018-12-26 ENCOUNTER — Ambulatory Visit
Admission: RE | Admit: 2018-12-26 | Discharge: 2018-12-26 | Disposition: A | Discharge status: Home / Self Care | Payer: Medicare HMO | Source: Ambulatory Visit | Attending: Urology | Admitting: Urology

## 2018-12-26 ENCOUNTER — Telehealth: Payer: Self-pay

## 2018-12-26 DIAGNOSIS — R972 Elevated prostate specific antigen [PSA]: Secondary | ICD-10-CM | POA: Diagnosis not present

## 2018-12-26 MED ORDER — GADOBENATE DIMEGLUMINE 529 MG/ML IV SOLN
15.0000 mL | Freq: Once | INTRAVENOUS | Status: AC | PRN
  Administered 2018-12-26: 15 mL via INTRAVENOUS

## 2018-12-26 NOTE — Telephone Encounter (Signed)
-----   Message from Nori Riis, [name] sent at 12/26/2018  4:42 PM EST ----- Please let Mr. Reiber know that his MRI of his prostate is negative for any aggressive prostate cancer..  We will see him in 6 months with a PSA prior to his appointment

## 2018-12-26 NOTE — Telephone Encounter (Signed)
Patient notified of results and psa order placed Please schedule follow up and lab

## 2018-12-27 NOTE — Telephone Encounter (Signed)
apps have been made and mailed to patient   [name]

## 2019-02-09 ENCOUNTER — Other Ambulatory Visit: Payer: Self-pay | Admitting: Family Medicine

## 2019-02-12 ENCOUNTER — Ambulatory Visit: Payer: Medicare HMO | Admitting: Family Medicine

## 2019-06-27 ENCOUNTER — Other Ambulatory Visit: Payer: Medicare HMO

## 2019-07-03 ENCOUNTER — Ambulatory Visit: Payer: Medicare HMO | Admitting: Urology

## 2019-07-17 ENCOUNTER — Other Ambulatory Visit: Payer: Medicare HMO

## 2019-07-20 ENCOUNTER — Other Ambulatory Visit: Payer: Self-pay

## 2019-07-20 ENCOUNTER — Other Ambulatory Visit: Payer: Medicare HMO

## 2019-07-20 DIAGNOSIS — R972 Elevated prostate specific antigen [PSA]: Secondary | ICD-10-CM | POA: Diagnosis not present

## 2019-07-21 LAB — PSA: Prostate Specific Ag, Serum: 15 ng/mL — ABNORMAL HIGH (ref 0.0–4.0)

## 2019-07-23 NOTE — Progress Notes (Signed)
07/24/2019 1:29 PM   [name] 01-28-1941 [id number]  Referring provider: [name], MD [place],  [place] [id number]  No chief complaint on file.   HPI: Patient is a 78 year old male with recurrent kidney stones, BPH with LUTS, elevated PSA and urge incontinence who presents today for 6 month follow-up.  Recurrent kidney stones Patient has an occasional episode of intermittent flank pain.  He is s/p left URS/LL/ureteral stent placement with stent removal in 10/2014, right ESWL in 12/2014.  Metabolic work up indicated a low volume of urine at 1.47 L and low urinary citrate. Urinary pH 5.56.  His 24 hour urine magnesium was borderline low.  No other obvious metabolic derangements. His creatinine was 1.13.  He reports that he has not been diligent in increasing his water intake.  KUB on 09/08/2017 again noted bilateral nephrolithiasis.  He is still reluctant to increase his water intake.    BPH WITH LUTS His IPSS score today is 6, which is mild lower urinary tract symptomatology.  He is pleased with his quality life due to his urinary symptoms.  His previous I PSS score was 2/3.  His previous PVR was 98 mL.  His major complaints today is intermittency.  His previous IPSS score was 14/2.  His PVR is 11 mL.  He has had these symptoms for several years.  He denies any dysuria, hematuria or suprapubic pain.    IPSS    Row Name 07/24/19 1300         International Prostate Symptom Score   How often have you had the sensation of not emptying your bladder?  Less than 1 in 5     How often have you had to urinate less than every two hours?  Less than 1 in 5 times     How often have you found you stopped and started again several times when you urinated?  Less than 1 in 5 times     How often have you found it difficult to postpone urination?  Not at All     How often have you had a weak urinary stream?  Less than 1 in 5 times     How often have you had to strain to  start urination?  Not at All     How many times did you typically get up at night to urinate?  2 Times     Total IPSS Score  6       Quality of Life due to urinary symptoms   If you were to spend the rest of your life with your urinary condition just the way it is now how would you feel about that?  Pleased        Score:  1-7 Mild 8-19 Moderate 20-35 Severe  Elevated PSA Patient has had a history of s/p negative biopsy x 3. Awaiting records after numerous requests from patient and [name] office. Most recent PSA very elevated (19) but likely secondary to recent endoscopic manipulation. Repeat PSA 15 ng/dL on 12/15, and 13.3 on 03/13/15.  Now, PSA was 19.23 on 07/20/2016.  Prostate enlarged on transabdominal ultrasound volume 5.1 x 5.5 x 6.6 cm..  A free and total PSA noted a PSA of 15.2 with a probability of 9% of having prostate cancer.  He does not want to undergo another biopsy.  Prostate MRI in 12/2018 revealed no radiographic evidence of high-grade prostate carcinoma. PI-RADS 1: Very Low (clinically significant cancer is highly unlikely  to be present).  His current PSA is 15.0 on 07/20/2019.    Urge incontinence He is no longer taking the Vesicare.  He is not finding his urge incontinence bothersome at this time.   PMH: Past Medical History:  Diagnosis Date  . BPH with urinary obstruction    sees urology Dr. [name]  . Carotid stenosis 07/20/2016   Minimal on [place] 07/2016. Monitor clinically.  . Depression with anxiety   . Elevated PSA 2014   sees urology - normal biopsies x 3  . HLD (hyperlipidemia)   . Left kidney mass 09/2014   on CT scan thought consistent with simple cyst on prior US  . Nasal obstruction    congenital R sided  . Obstructive pyelonephritis 09/2014   s/p hospitalization and treatment with levaquin  . OSA (obstructive sleep apnea)    pt denies  . Osteoporosis 06/04/2018   DEXA T score -37 at L spine  . Recurrent kidney stones 2014   extensive  bilateral nephrolithiasis, sees urology [name]), hydrocodone prn  . Symptomatic PVCs    per prior cardiologist, on beta blocker  . Urge incontinence    thought overactive bladder    Surgical History: Past Surgical History:  Procedure Laterality Date  . CARDIAC CATHETERIZATION  03/2012   EF 45-50%, no LVH, + impaired LV relaxation, no PH, no valve abnormalities  . CARDIOVASCULAR STRESS TEST  03/2012   mild peri infarct ischemia in apical segment, decreased EF  . CATARACT EXTRACTION Right 01/2013  . COLONOSCOPY  02/2008   mod diverticulosis o/w WNL (2009)  . CYSTOSCOPY    . CYSTOSCOPY  10/2014   s/p stone removal, with stent removal [name])  . TONSILECTOMY, ADENOIDECTOMY, BILATERAL MYRINGOTOMY AND TUBES     removed as a child  . US ECHOCARDIOGRAPHY  03/2012   hypokinetic anterior wall, EF 45-50%, impaired relaxation pattern, mild LA dilation, mild-mod MR, mild-mod AR    Home Medications:  Allergies as of 07/24/2019   No Known Allergies     Medication List       Accurate as of July 24, 2019  1:29 PM. If you have any questions, ask your nurse or doctor.        b complex vitamins capsule Take 1 capsule by mouth daily.   calcium carbonate 600 MG tablet Commonly known as: OS-CAL Take 1 tablet (600 mg total) by mouth daily.   clonazePAM 1 MG tablet Commonly known as: KLONOPIN TAKE 1 TABLET TWICE DAILY AS NEEDED   metoprolol succinate 25 MG 24 hr tablet Commonly known as: TOPROL-XL TAKE 1/2 TABLET EVERY DAY   Vitamin D3 25 MCG (1000 UT) Caps Take 1 capsule (1,000 Units total) by mouth daily.       Allergies: No Known Allergies  Family History: Family History  Problem Relation Age of Onset  . Urolithiasis Daughter   . Kidney Stones Mother   . Diabetes Neg Hx   . Cancer Neg Hx   . CAD Neg Hx   . Stroke Neg Hx   . Hypertension Neg Hx   . Prostate cancer Neg Hx   . Kidney cancer Neg Hx   . Bladder Cancer Neg Hx     Social History:  reports that he has never  smoked. He has never used smokeless tobacco. He reports that he does not drink alcohol or use drugs.  ROS: UROLOGY Frequent Urination?: No Hard to postpone urination?: No Burning/pain with urination?: No Get up at night to urinate?: No Leakage  of urine?: No Urine stream starts and stops?: Yes Trouble starting stream?: No Do you have to strain to urinate?: No Blood in urine?: No Urinary tract infection?: No Sexually transmitted disease?: No Injury to kidneys or bladder?: No Painful intercourse?: No Weak stream?: No Erection problems?: No Penile pain?: No  Gastrointestinal Nausea?: No Vomiting?: No Indigestion/heartburn?: No Diarrhea?: No Constipation?: No  Constitutional Fever: No Night sweats?: No Weight loss?: No Fatigue?: No  Skin Skin rash/lesions?: No Itching?: No  Eyes Blurred vision?: No Double vision?: No  Ears/Nose/Throat Sore throat?: No Sinus problems?: No  Hematologic/Lymphatic Swollen glands?: No Easy bruising?: No  Cardiovascular Leg swelling?: No Chest pain?: No  Respiratory Cough?: No Shortness of breath?: No  Endocrine Excessive thirst?: No  Musculoskeletal Back pain?: No Joint pain?: No  Neurological Headaches?: No Dizziness?: No  Psychologic Depression?: No Anxiety?: No  Physical Exam: BP 123/75   Pulse (!) 57   Ht 5\' 8"  (1.727 m)   Wt 150 lb (68 kg)   BMI 22.81 kg/m   Constitutional:  Well nourished. Alert and oriented, No acute distress. HEENT: [place] AT, moist mucus membranes.  Trachea midline, no masses. Cardiovascular: No clubbing, cyanosis, or edema. Respiratory: Normal respiratory effort, no increased work of breathing. GI: Abdomen is soft, non tender, non distended, no abdominal masses. Liver and spleen not palpable.  No hernias appreciated.  Stool sample for occult testing is not indicated.   GU: No CVA tenderness.  No bladder fullness or masses.  Patient with uncircumcised phallus.  Foreskin easily retracted   Urethral meatus is patent.  No penile discharge. No penile lesions or rashes. Scrotum without lesions, cysts, rashes and/or edema.  Testicles are located scrotally bilaterally. No masses are appreciated in the testicles. Left and right epididymis are normal. Rectal: Patient with  normal sphincter tone. Anus and perineum without scarring or rashes. No rectal masses are appreciated. Prostate is approximately 70 + grams, could only palpate apex and midportion of the gland, no nodules are appreciated. Seminal vesicles could not be palpated.   Skin: No rashes, bruises or suspicious lesions. Lymph: No inguinal adenopathy. Neurologic: Grossly intact, no focal deficits, moving all 4 extremities. Psychiatric: Normal mood and affect.  Laboratory Data: Lab Results  Component Value Date   CREATININE 1.20 06/07/2018   PSA History  Component     Latest Ref Rng & Units 07/30/2015 07/22/2016 07/28/2016 03/01/2017             Prostate Specific Ag, Serum     0.0 - 4.0 ng/mL 15.1 (H) 13.8 (H) 15.2 (H) 16.6 (H)   Component     Latest Ref Rng & Units 03/08/2017 06/09/2017 06/09/2017 09/01/2017          1:57 PM  1:57 PM   Prostate Specific Ag, Serum     0.0 - 4.0 ng/mL 17.5 (H) 14.9 (H) 14.8 (H) 16.5 (H)   Component     Latest Ref Rng & Units 03/06/2018 08/30/2018 07/20/2019            Prostate Specific Ag, Serum     0.0 - 4.0 ng/mL 15.7 (H) 21.2 (H) 15.0 (H)        Component Value Date/Time   CHOL 208 (H) 08/09/2018 1245   HDL 55.50 08/09/2018 1245   CHOLHDL 4 08/09/2018 1245   VLDL 17.4 08/09/2018 1245   LDLCALC 135 (H) 08/09/2018 1245    Lab Results  Component Value Date   AST 16 06/07/2018   Lab Results  Component  Value Date   ALT 9 06/07/2018   I have reviewed labs   Assessment & Plan:    1. Recurrent nephrolithiasis Asymptomatic at this time - defers KUB Advised to contact our office or seek treatment in the ED if becomes febrile or pain/ vomiting are difficult control in order to  arrange for emergent/urgent intervention  2. BPH with LUTS IPSS score is 6/1, it is worsening  Continue conservative management, avoiding bladder irritants and timed voiding's  3. Elevated PSA Prostate MRI in 12/2018 was negative for high-grade lesions Current PSA at baseline RTC in 6 months for PSA  4. Urge incontinence Managing conservatively - wife is disabled and needs one on one care   Return in about 6 months (around 01/24/2020) for IPSS, PSA and exam.  These notes generated with voice recognition software. I apologize for typographical errors.  [name], [name]  [facility] [address] [place], [place] [address] [phone]

## 2019-07-24 ENCOUNTER — Encounter: Payer: Self-pay | Admitting: Urology

## 2019-07-24 ENCOUNTER — Other Ambulatory Visit: Payer: Self-pay

## 2019-07-24 ENCOUNTER — Ambulatory Visit (INDEPENDENT_AMBULATORY_CARE_PROVIDER_SITE_OTHER): Payer: Medicare HMO | Admitting: Urology

## 2019-07-24 VITALS — BP 123/75 | HR 57 | Ht 68.0 in | Wt 150.0 lb

## 2019-07-24 DIAGNOSIS — N401 Enlarged prostate with lower urinary tract symptoms: Secondary | ICD-10-CM | POA: Diagnosis not present

## 2019-07-24 DIAGNOSIS — N138 Other obstructive and reflux uropathy: Secondary | ICD-10-CM | POA: Diagnosis not present

## 2019-07-24 DIAGNOSIS — R972 Elevated prostate specific antigen [PSA]: Secondary | ICD-10-CM

## 2019-07-25 DIAGNOSIS — L578 Other skin changes due to chronic exposure to nonionizing radiation: Secondary | ICD-10-CM | POA: Diagnosis not present

## 2019-07-25 DIAGNOSIS — L821 Other seborrheic keratosis: Secondary | ICD-10-CM | POA: Diagnosis not present

## 2019-07-25 DIAGNOSIS — H61001 Unspecified perichondritis of right external ear: Secondary | ICD-10-CM | POA: Diagnosis not present

## 2019-07-25 DIAGNOSIS — C4442 Squamous cell carcinoma of skin of scalp and neck: Secondary | ICD-10-CM | POA: Diagnosis not present

## 2019-07-25 DIAGNOSIS — L570 Actinic keratosis: Secondary | ICD-10-CM | POA: Diagnosis not present

## 2019-08-06 DIAGNOSIS — H0012 Chalazion right lower eyelid: Secondary | ICD-10-CM | POA: Diagnosis not present

## 2019-08-13 ENCOUNTER — Other Ambulatory Visit: Payer: Self-pay | Admitting: Family Medicine

## 2019-08-13 ENCOUNTER — Ambulatory Visit: Payer: Medicare HMO

## 2019-08-13 DIAGNOSIS — M8080XD Other osteoporosis with current pathological fracture, unspecified site, subsequent encounter for fracture with routine healing: Secondary | ICD-10-CM

## 2019-08-13 DIAGNOSIS — R739 Hyperglycemia, unspecified: Secondary | ICD-10-CM

## 2019-08-13 DIAGNOSIS — E785 Hyperlipidemia, unspecified: Secondary | ICD-10-CM

## 2019-08-13 DIAGNOSIS — N289 Disorder of kidney and ureter, unspecified: Secondary | ICD-10-CM

## 2019-08-13 DIAGNOSIS — E213 Hyperparathyroidism, unspecified: Secondary | ICD-10-CM

## 2019-08-14 ENCOUNTER — Other Ambulatory Visit (INDEPENDENT_AMBULATORY_CARE_PROVIDER_SITE_OTHER): Payer: Medicare HMO

## 2019-08-14 ENCOUNTER — Ambulatory Visit (INDEPENDENT_AMBULATORY_CARE_PROVIDER_SITE_OTHER): Payer: Medicare HMO

## 2019-08-14 VITALS — BP 118/70 | Temp 96.5°F | Ht 68.0 in | Wt 150.0 lb

## 2019-08-14 DIAGNOSIS — R739 Hyperglycemia, unspecified: Secondary | ICD-10-CM

## 2019-08-14 DIAGNOSIS — E785 Hyperlipidemia, unspecified: Secondary | ICD-10-CM

## 2019-08-14 DIAGNOSIS — M8080XD Other osteoporosis with current pathological fracture, unspecified site, subsequent encounter for fracture with routine healing: Secondary | ICD-10-CM

## 2019-08-14 DIAGNOSIS — N289 Disorder of kidney and ureter, unspecified: Secondary | ICD-10-CM | POA: Diagnosis not present

## 2019-08-14 DIAGNOSIS — E213 Hyperparathyroidism, unspecified: Secondary | ICD-10-CM

## 2019-08-14 DIAGNOSIS — Z0000 Encounter for general adult medical examination without abnormal findings: Secondary | ICD-10-CM | POA: Diagnosis not present

## 2019-08-14 NOTE — Patient Instructions (Signed)
[name] , Thank you for taking time to come for your Medicare Wellness Visit. I appreciate your ongoing commitment to your health goals. Please review the following plan we discussed and let me know if I can assist you in the future.   Screening recommendations/referrals: Colonoscopy: not required Recommended yearly ophthalmology/optometry visit for glaucoma screening and checkup Recommended yearly dental visit for hygiene and checkup  Vaccinations: Influenza vaccine: due Pneumococcal vaccine: 02/2014 Tdap vaccine: 01/2011 Shingles vaccine: discussed    Advanced directives: copy in chart  Conditions/risks identified: none  Next appointment: 08/21/2019 at 2:15  Preventive Care 80 Years and Older, Male Preventive care refers to lifestyle choices and visits with your health care provider that can promote health and wellness. What does preventive care include?  A yearly physical exam. This is also called an annual well check.  Dental exams once or twice a year.  Routine eye exams. Ask your health care provider how often you should have your eyes checked.  Personal lifestyle choices, including:  Daily care of your teeth and gums.  Regular physical activity.  Eating a healthy diet.  Avoiding tobacco and drug use.  Limiting alcohol use.  Practicing safe sex.  Taking low doses of aspirin every day.  Taking vitamin and mineral supplements as recommended by your health care provider. What happens during an annual well check? The services and screenings done by your health care provider during your annual well check will depend on your age, overall health, lifestyle risk factors, and family history of disease. Counseling  Your health care provider may ask you questions about your:  Alcohol use.  Tobacco use.  Drug use.  Emotional well-being.  Home and relationship well-being.  Sexual activity.  Eating habits.  History of falls.  Memory and ability to understand  (cognition).  Work and work Statistician. Screening  You may have the following tests or measurements:  Height, weight, and BMI.  Blood pressure.  Lipid and cholesterol levels. These may be checked every 5 years, or more frequently if you are over 81 years old.  Skin check.  Lung cancer screening. You may have this screening every year starting at age 29 if you have a 30-pack-year history of smoking and currently smoke or have quit within the past 15 years.  Fecal occult blood test (FOBT) of the stool. You may have this test every year starting at age 74.  Flexible sigmoidoscopy or colonoscopy. You may have a sigmoidoscopy every 5 years or a colonoscopy every 10 years starting at age 15.  Prostate cancer screening. Recommendations will vary depending on your family history and other risks.  Hepatitis C blood test.  Hepatitis B blood test.  Sexually transmitted disease (STD) testing.  Diabetes screening. This is done by checking your blood sugar (glucose) after you have not eaten for a while (fasting). You may have this done every 1-3 years.  Abdominal aortic aneurysm (AAA) screening. You may need this if you are a current or former smoker.  Osteoporosis. You may be screened starting at age 21 if you are at high risk. Talk with your health care provider about your test results, treatment options, and if necessary, the need for more tests. Vaccines  Your health care provider may recommend certain vaccines, such as:  Influenza vaccine. This is recommended every year.  Tetanus, diphtheria, and acellular pertussis (Tdap, Td) vaccine. You may need a Td booster every 10 years.  Zoster vaccine. You may need this after age 71.  Pneumococcal 13-valent  conjugate (PCV13) vaccine. One dose is recommended after age 51.  Pneumococcal polysaccharide (PPSV23) vaccine. One dose is recommended after age 51. Talk to your health care provider about which screenings and vaccines you need and  how often you need them. This information is not intended to replace advice given to you by your health care provider. Make sure you discuss any questions you have with your health care provider. Document Released: 12/26/2015 Document Revised: 08/18/2016 Document Reviewed: 09/30/2015 Elsevier Interactive Patient Education  2017 [place] Prevention in the Home Falls can cause injuries. They can happen to people of all ages. There are many things you can do to make your home safe and to help prevent falls. What can I do on the outside of my home?  Regularly fix the edges of walkways and driveways and fix any cracks.  Remove anything that might make you trip as you walk through a door, such as a raised step or threshold.  Trim any bushes or trees on the path to your home.  Use bright outdoor lighting.  Clear any walking paths of anything that might make someone trip, such as rocks or tools.  Regularly check to see if handrails are loose or broken. Make sure that both sides of any steps have handrails.  Any raised decks and porches should have guardrails on the edges.  Have any leaves, snow, or ice cleared regularly.  Use sand or salt on walking paths during winter.  Clean up any spills in your garage right away. This includes oil or grease spills. What can I do in the bathroom?  Use night lights.  Install grab bars by the toilet and in the tub and shower. Do not use towel bars as grab bars.  Use non-skid mats or decals in the tub or shower.  If you need to sit down in the shower, use a plastic, non-slip stool.  Keep the floor dry. Clean up any water that spills on the floor as soon as it happens.  Remove soap buildup in the tub or shower regularly.  Attach bath mats securely with double-sided non-slip rug tape.  Do not have throw rugs and other things on the floor that can make you trip. What can I do in the bedroom?  Use night lights.  Make sure that you  have a light by your bed that is easy to reach.  Do not use any sheets or blankets that are too big for your bed. They should not hang down onto the floor.  Have a firm chair that has side arms. You can use this for support while you get dressed.  Do not have throw rugs and other things on the floor that can make you trip. What can I do in the kitchen?  Clean up any spills right away.  Avoid walking on wet floors.  Keep items that you use a lot in easy-to-reach places.  If you need to reach something above you, use a strong step stool that has a grab bar.  Keep electrical cords out of the way.  Do not use floor polish or wax that makes floors slippery. If you must use wax, use non-skid floor wax.  Do not have throw rugs and other things on the floor that can make you trip. What can I do with my stairs?  Do not leave any items on the stairs.  Make sure that there are handrails on both sides of the stairs and use them. Fix handrails  that are broken or loose. Make sure that handrails are as long as the stairways.  Check any carpeting to make sure that it is firmly attached to the stairs. Fix any carpet that is loose or worn.  Avoid having throw rugs at the top or bottom of the stairs. If you do have throw rugs, attach them to the floor with carpet tape.  Make sure that you have a light switch at the top of the stairs and the bottom of the stairs. If you do not have them, ask someone to add them for you. What else can I do to help prevent falls?  Wear shoes that:  Do not have high heels.  Have rubber bottoms.  Are comfortable and fit you well.  Are closed at the toe. Do not wear sandals.  If you use a stepladder:  Make sure that it is fully opened. Do not climb a closed stepladder.  Make sure that both sides of the stepladder are locked into place.  Ask someone to hold it for you, if possible.  Clearly mark and make sure that you can see:  Any grab bars or  handrails.  First and last steps.  Where the edge of each step is.  Use tools that help you move around (mobility aids) if they are needed. These include:  Canes.  Walkers.  Scooters.  Crutches.  Turn on the lights when you go into a dark area. Replace any light bulbs as soon as they burn out.  Set up your furniture so you have a clear path. Avoid moving your furniture around.  If any of your floors are uneven, fix them.  If there are any pets around you, be aware of where they are.  Review your medicines with your doctor. Some medicines can make you feel dizzy. This can increase your chance of falling. Ask your doctor what other things that you can do to help prevent falls. This information is not intended to replace advice given to you by your health care provider. Make sure you discuss any questions you have with your health care provider. Document Released: 09/25/2009 Document Revised: 05/06/2016 Document Reviewed: 01/03/2015 Elsevier Interactive Patient Education  2017 [employer].

## 2019-08-14 NOTE — Progress Notes (Signed)
Subjective:   [name] is a 78 y.o. male who presents for Medicare Annual/Subsequent preventive examination.  This visit type was conducted due to national recommendations for restrictions regarding the COVID-19 Pandemic (e.g. social distancing). This format is felt to be most appropriate for this patient at this time. All issues noted in this document were discussed and addressed. No physical exam was performed (except for noted visual exam findings with Video Visits). This patient, [name], has given permission to perform this visit via telephone. Vital signs may be absent or patient reported.  Patient location:  At home  Nurse location:  At home     Review of Systems:  n/a Cardiac Risk Factors include: advanced age (>69men, >10 women);male gender;hypertension;sedentary lifestyle     Objective:    Vitals: BP 118/70 Comment: per patient  Temp (!) 96.5 F (35.8 C) Comment: per patient  Ht 5\' 8"  (1.727 m) Comment: per patient  Wt 150 lb (68 kg) Comment: per patient  BMI 22.81 kg/m   Body mass index is 22.81 kg/m.  Advanced Directives 08/14/2019 08/09/2018 05/21/2016  Does Patient Have a Medical Advance Directive? Yes Yes Yes  Type of Paramedic of [place];Living will [place];Living will [place];Living will  Does patient want to make changes to medical advance directive? - - No - Patient declined  Copy of [place] in Chart? Yes - validated most recent copy scanned in chart (See row information) Yes No - copy requested    Tobacco Social History   Tobacco Use  Smoking Status Never Smoker  Smokeless Tobacco Never Used     Counseling given: Not Answered   Clinical Intake:  Pre-visit preparation completed: Yes  Pain : No/denies pain     Nutritional Status: BMI of 19-24  Normal Nutritional Risks: None Diabetes: No  How often do you need to have someone help you when you  read instructions, pamphlets, or other written materials from your doctor or pharmacy?: 1 - Never What is the last grade level you completed in school?: GED  Interpreter Needed?: No  Information entered by :: NAllen LPN  Past Medical History:  Diagnosis Date  . BPH with urinary obstruction    sees urology Dr. [name]  . Carotid stenosis 07/20/2016   Minimal on [place] 07/2016. Monitor clinically.  . Depression with anxiety   . Elevated PSA 2014   sees urology - normal biopsies x 3  . HLD (hyperlipidemia)   . Left kidney mass 09/2014   on CT scan thought consistent with simple cyst on prior US  . Nasal obstruction    congenital R sided  . Obstructive pyelonephritis 09/2014   s/p hospitalization and treatment with levaquin  . OSA (obstructive sleep apnea)    pt denies  . Osteoporosis 06/04/2018   DEXA T score -37 at L spine  . Recurrent kidney stones 2014   extensive bilateral nephrolithiasis, sees urology [name]), hydrocodone prn  . Symptomatic PVCs    per prior cardiologist, on beta blocker  . Urge incontinence    thought overactive bladder   Past Surgical History:  Procedure Laterality Date  . CARDIAC CATHETERIZATION  03/2012   EF 45-50%, no LVH, + impaired LV relaxation, no PH, no valve abnormalities  . CARDIOVASCULAR STRESS TEST  03/2012   mild peri infarct ischemia in apical segment, decreased EF  . CATARACT EXTRACTION Right 01/2013  . COLONOSCOPY  02/2008   mod diverticulosis o/w WNL (  2009)  . CYSTOSCOPY    . CYSTOSCOPY  10/2014   s/p stone removal, with stent removal [name])  . TONSILECTOMY, ADENOIDECTOMY, BILATERAL MYRINGOTOMY AND TUBES     removed as a child  . US ECHOCARDIOGRAPHY  03/2012   hypokinetic anterior wall, EF 45-50%, impaired relaxation pattern, mild LA dilation, mild-mod MR, mild-mod AR   Family History  Problem Relation Age of Onset  . Urolithiasis Daughter   . Kidney Stones Mother   . Diabetes Neg Hx   . Cancer Neg Hx   . CAD Neg Hx   . Stroke Neg  Hx   . Hypertension Neg Hx   . Prostate cancer Neg Hx   . Kidney cancer Neg Hx   . Bladder Cancer Neg Hx    Social History   Socioeconomic History  . Marital status: Married    Spouse name: Not on file  . Number of children: Not on file  . Years of education: Not on file  . Highest education level: Not on file  Occupational History  . Not on file  Social Needs  . Financial resource strain: Not hard at all  . Food insecurity    Worry: Never true    Inability: Never true  . Transportation needs    Medical: No    Non-medical: No  Tobacco Use  . Smoking status: Never Smoker  . Smokeless tobacco: Never Used  Substance and Sexual Activity  . Alcohol use: No  . Drug use: No  . Sexual activity: Not Currently  Lifestyle  . Physical activity    Days per week: 0 days    Minutes per session: 0 min  . Stress: Not at all  Relationships  . Social Herbalist on phone: Not on file    Gets together: Not on file    Attends religious service: Not on file    Active member of club or organization: Not on file    Attends meetings of clubs or organizations: Not on file    Relationship status: Not on file  Other Topics Concern  . Not on file  Social History Narrative   Lives with wife [name])    1 grown daughter   Occupation: retired, worked in Public relations account executive for 30 yrs   Edu: GED   Activity: walking some   Diet: good water, fruits/vegetables daily.      Advanced directives: daughter is HCPOA for now    Outpatient Encounter Medications as of 08/14/2019  Medication Sig  . Multiple Vitamins-Minerals (ONE-A-DAY MENS 50+ ADVANTAGE) TABS Take by mouth.  [name] b complex vitamins capsule Take 1 capsule by mouth daily.  . calcium carbonate (OS-CAL) 600 MG tablet Take 1 tablet (600 mg total) by mouth daily. (Patient not taking: Reported on 08/14/2019)  . Cholecalciferol (VITAMIN D3) 1000 units CAPS Take 1 capsule (1,000 Units total) by mouth daily. (Patient not taking: Reported on  08/14/2019)  . clonazePAM (KLONOPIN) 1 MG tablet TAKE 1 TABLET TWICE DAILY AS NEEDED (Patient not taking: Reported on 08/14/2019)  . metoprolol succinate (TOPROL-XL) 25 MG 24 hr tablet TAKE 1/2 TABLET EVERY DAY (Patient not taking: Reported on 08/14/2019)   No facility-administered encounter medications on file as of 08/14/2019.     Activities of Daily Living In your present state of health, do you have any difficulty performing the following activities: 08/14/2019  Hearing? Y  Comment tinnitus  Vision? N  Difficulty concentrating or making decisions? N  Walking or climbing stairs? N  Dressing or bathing? N  Doing errands, shopping? N  Preparing Food and eating ? N  Using the Toilet? N  In the past six months, have you accidently leaked urine? Y  Comment has kidney stones  Do you have problems with loss of bowel control? N  Managing your Medications? N  Managing your Finances? N  Housekeeping or managing your Housekeeping? N  Some recent data might be hidden    Patient Care Team: [name], MD as PCP - General (Family Medicine) Thelma Comp, [place] as Referring Physician (Optometry)   Assessment:   This is a routine wellness examination for Jafari.  Exercise Activities and Dietary recommendations Current Exercise Habits: The patient does not participate in regular exercise at present  Goals    . Patient Stated     Starting 08/09/2018, I will continue to medications as prescribed.     . Patient Stated     08/14/2019, Wants to eat healthier       Fall Risk Fall Risk  08/14/2019 08/09/2018 08/05/2017 05/21/2016 02/19/2014  Falls in the past year? 0 No No No No  Follow up Falls evaluation completed;Falls prevention discussed - - - -   Is the patient's home free of loose throw rugs in walkways, pet beds, electrical cords, etc?   yes      Grab bars in the bathroom? no      Handrails on the stairs?   yes      Adequate lighting?   yes  Timed Get Up and Go Performed: n/a   Depression Screen PHQ 2/9 Scores 08/14/2019 08/09/2018 08/05/2017 05/21/2016  PHQ - 2 Score 0 0 1 0  PHQ- 9 Score 0 0 - -    Cognitive Function MMSE - Mini Mental State Exam 08/14/2019 08/09/2018 05/21/2016  Orientation to time 5 5 5   Orientation to Place 5 5 5   Registration 3 3 3   Attention/ Calculation 3 0 0  Recall 3 3 3   Language- name 2 objects 0 0 0  Language- repeat 1 1 1   Language- follow 3 step command 0 3 3  Language- read & follow direction 0 0 0  Write a sentence 0 0 0  Copy design 0 0 0  Total score 20 20 20    Mini Cog  Mini-Cog screen was completed. Maximum score is 22. A value of 0 denotes this part of the MMSE was not completed or the patient failed this part of the Mini-Cog screening.       Immunization History  Administered Date(s) Administered  . Influenza Whole 10/13/2013  . Influenza,inj,Quad PF,6+ Mos 08/26/2015, 08/25/2016, 09/01/2017, 08/11/2018  . Pneumococcal Conjugate-13 02/19/2014  . Pneumococcal Polysaccharide-23 01/18/2011  . Tdap 01/18/2011  . Zoster 06/23/2015    Qualifies for Shingles Vaccine? yes  Screening Tests Health Maintenance  Topic Date Due  . INFLUENZA VACCINE  07/14/2019  . DTaP/Tdap/Td (2 - Td) 01/18/2021  . TETANUS/TDAP  01/18/2021  . PNA vac Low Risk Adult  Completed   Cancer Screenings: Lung: Low Dose CT Chest recommended if Age 10-80 years, 30 pack-year currently smoking OR have quit w/in 15years. Patient does not qualify. Colorectal: not required  Additional Screenings:  Hepatitis C Screening:n/a      Plan:    Patient wants to eat healthier.  I have personally reviewed and noted the following in the patient's chart:   . Medical and social history . Use of alcohol, tobacco or illicit drugs  . Current medications and supplements . Functional ability  and status . Nutritional status . Physical activity . Advanced directives . List of other physicians . Hospitalizations, surgeries, and ER visits in previous 12  months . Vitals . Screenings to include cognitive, depression, and falls . Referrals and appointments  In addition, I have reviewed and discussed with patient certain preventive protocols, quality metrics, and best practice recommendations. A written personalized care plan for preventive services as well as general preventive health recommendations were provided to patient.     Kellie Simmering, LPN  [id number]

## 2019-08-14 NOTE — Progress Notes (Signed)
PCP notes:  Health Maintenance:  Flu vaccine is due.  Abnormal Screenings:  None  Patient concerns:  None  Nurse concerns:  None  Next PCP appt.: 08/21/2019 at 2:15

## 2019-08-15 LAB — CBC WITH DIFFERENTIAL/PLATELET
Basophils Absolute: 0.1 10*3/uL (ref 0.0–0.1)
Basophils Relative: 0.8 % (ref 0.0–3.0)
Eosinophils Absolute: 0.1 10*3/uL (ref 0.0–0.7)
Eosinophils Relative: 2.2 % (ref 0.0–5.0)
HCT: 45 % (ref 39.0–52.0)
Hemoglobin: 14.8 g/dL (ref 13.0–17.0)
Lymphocytes Relative: 21.4 % (ref 12.0–46.0)
Lymphs Abs: 1.4 10*3/uL (ref 0.7–4.0)
MCHC: 32.9 g/dL (ref 30.0–36.0)
MCV: 91.2 fl (ref 78.0–100.0)
Monocytes Absolute: 0.6 10*3/uL (ref 0.1–1.0)
Monocytes Relative: 8.5 % (ref 3.0–12.0)
Neutro Abs: 4.4 10*3/uL (ref 1.4–7.7)
Neutrophils Relative %: 67.1 % (ref 43.0–77.0)
Platelets: 144 10*3/uL — ABNORMAL LOW (ref 150.0–400.0)
RBC: 4.94 Mil/uL (ref 4.22–5.81)
RDW: 13.6 % (ref 11.5–15.5)
WBC: 6.6 10*3/uL (ref 4.0–10.5)

## 2019-08-15 LAB — COMPREHENSIVE METABOLIC PANEL
ALT: 11 U/L (ref 0–53)
AST: 19 U/L (ref 0–37)
Albumin: 4.1 g/dL (ref 3.5–5.2)
Alkaline Phosphatase: 61 U/L (ref 39–117)
BUN: 18 mg/dL (ref 6–23)
CO2: 27 mEq/L (ref 19–32)
Calcium: 9.1 mg/dL (ref 8.4–10.5)
Chloride: 106 mEq/L (ref 96–112)
Creatinine, Ser: 1.24 mg/dL (ref 0.40–1.50)
GFR: 56.08 mL/min — ABNORMAL LOW (ref >60.00)
Glucose, Bld: 82 mg/dL (ref 70–99)
Potassium: 4.5 mEq/L (ref 3.5–5.1)
Sodium: 141 mEq/L (ref 135–145)
Total Bilirubin: 0.8 mg/dL (ref 0.2–1.2)
Total Protein: 6.7 g/dL (ref 6.0–8.3)

## 2019-08-15 LAB — PARATHYROID HORMONE, INTACT (NO CA): PTH: 49 pg/mL (ref 14–64)

## 2019-08-15 LAB — LIPID PANEL
Cholesterol: 195 mg/dL (ref 0–200)
HDL: 46.8 mg/dL (ref >39.00)
LDL Cholesterol: 127 mg/dL — ABNORMAL HIGH (ref 0–99)
NonHDL: 148.2
Total CHOL/HDL Ratio: 4
Triglycerides: 104 mg/dL (ref 0.0–149.0)
VLDL: 20.8 mg/dL (ref 0.0–40.0)

## 2019-08-15 LAB — TSH: TSH: 2.74 u[IU]/mL (ref 0.35–4.50)

## 2019-08-15 LAB — HEMOGLOBIN A1C: Hgb A1c MFr Bld: 5.6 % (ref 4.6–6.5)

## 2019-08-15 LAB — VITAMIN D 25 HYDROXY (VIT D DEFICIENCY, FRACTURES): VITD: 25.65 ng/mL — ABNORMAL LOW (ref 30.00–100.00)

## 2019-08-21 ENCOUNTER — Other Ambulatory Visit: Payer: Self-pay

## 2019-08-21 ENCOUNTER — Ambulatory Visit (INDEPENDENT_AMBULATORY_CARE_PROVIDER_SITE_OTHER): Payer: Medicare HMO | Admitting: Family Medicine

## 2019-08-21 ENCOUNTER — Encounter: Payer: Self-pay | Admitting: Family Medicine

## 2019-08-21 VITALS — BP 132/64 | HR 65 | Temp 97.6°F | Ht 65.5 in | Wt 152.0 lb

## 2019-08-21 DIAGNOSIS — I700 Atherosclerosis of aorta: Secondary | ICD-10-CM | POA: Diagnosis not present

## 2019-08-21 DIAGNOSIS — E785 Hyperlipidemia, unspecified: Secondary | ICD-10-CM | POA: Diagnosis not present

## 2019-08-21 DIAGNOSIS — N138 Other obstructive and reflux uropathy: Secondary | ICD-10-CM

## 2019-08-21 DIAGNOSIS — R972 Elevated prostate specific antigen [PSA]: Secondary | ICD-10-CM

## 2019-08-21 DIAGNOSIS — Z0000 Encounter for general adult medical examination without abnormal findings: Secondary | ICD-10-CM

## 2019-08-21 DIAGNOSIS — F419 Anxiety disorder, unspecified: Secondary | ICD-10-CM

## 2019-08-21 DIAGNOSIS — R739 Hyperglycemia, unspecified: Secondary | ICD-10-CM

## 2019-08-21 DIAGNOSIS — Z23 Encounter for immunization: Secondary | ICD-10-CM | POA: Diagnosis not present

## 2019-08-21 DIAGNOSIS — I708 Atherosclerosis of other arteries: Secondary | ICD-10-CM

## 2019-08-21 DIAGNOSIS — M818 Other osteoporosis without current pathological fracture: Secondary | ICD-10-CM

## 2019-08-21 DIAGNOSIS — F418 Other specified anxiety disorders: Secondary | ICD-10-CM | POA: Diagnosis not present

## 2019-08-21 DIAGNOSIS — I10 Essential (primary) hypertension: Secondary | ICD-10-CM

## 2019-08-21 DIAGNOSIS — Z1211 Encounter for screening for malignant neoplasm of colon: Secondary | ICD-10-CM

## 2019-08-21 DIAGNOSIS — N289 Disorder of kidney and ureter, unspecified: Secondary | ICD-10-CM

## 2019-08-21 DIAGNOSIS — I70299 Other atherosclerosis of native arteries of extremities, unspecified extremity: Secondary | ICD-10-CM | POA: Diagnosis not present

## 2019-08-21 DIAGNOSIS — N401 Enlarged prostate with lower urinary tract symptoms: Secondary | ICD-10-CM | POA: Diagnosis not present

## 2019-08-21 DIAGNOSIS — E213 Hyperparathyroidism, unspecified: Secondary | ICD-10-CM | POA: Diagnosis not present

## 2019-08-21 NOTE — Progress Notes (Signed)
This visit was conducted in person.  BP 132/64 (BP Location: Left Arm, Patient Position: Sitting, Cuff Size: Normal)   Pulse 65   Temp 97.6 F (36.4 C) (Tympanic)   Ht 5' 5.5" (1.664 m)   Wt 152 lb (68.9 kg)   SpO2 97%   BMI 24.91 kg/m    CC: CPE Subjective:    Patient ID: [name], male    DOB: 09/26/1941, 78 y.o.   MRN: [id number]  HPI: [name] is a 78 y.o. male presenting on 08/21/2019 for Annual Exam (Prt 2. )   Saw health advisor last week for medicare wellness visit. Note reviewed.    Osteoporosis - DEXA T score -3.7 at L spine (05/2018). Did not tolerate fosamax. prolia was unaffordable. Will check on yearly reclast. Vit D levels remain low - has not been taking replacement.   Preventative: Colon cancer screening - mod diverticulosis o/w WNL (2009). Discussed options - he would like iFOB.  Prostate cancer screening - followedregularlyby urology (Dr [name]) for high PSA with 3 normal biopsies.  DEXA screen - see above Flu shot yearly  Pneumovax 01/2011, prevnar 2015 Tdap 01/2011  zostavax - 2016 shingrix - discussed, declines Advanced directive: scanned 04/2018. HCPOA are daughter Dreama Saa and granddaughter Ebony Hail. Wants life prolonging measures as reasonably possible within limits of generally accepted health standards.  Seat belt use discussed Sunscreen use discussed. No changing moles on skin Non smoker Alcohol - none Dentist yearly  Eye exam yearly   Lives with wife [name])  1 grown daughter Occupation: retired, worked in Public relations account executive for 30 yrs Edu: GED Activity: no regular exercise Diet: good water, fruits/vegetables daily.     Relevant past medical, surgical, family and social history reviewed and updated as indicated. Interim medical history since our last visit reviewed. Allergies and medications reviewed and updated. Outpatient Medications Prior to Visit  Medication Sig Dispense Refill  . metoprolol succinate (TOPROL-XL) 25  MG 24 hr tablet TAKE 1/2 TABLET EVERY DAY 45 tablet 2  . Multiple Vitamins-Minerals (ONE-A-DAY MENS 50+ ADVANTAGE) TABS Take by mouth.    [name] PRESCRIPTION MEDICATION Place into the right eye at bedtime. Eye ointment    . b complex vitamins capsule Take 1 capsule by mouth daily.    . calcium carbonate (OS-CAL) 600 MG tablet Take 1 tablet (600 mg total) by mouth daily. (Patient not taking: Reported on 08/14/2019) 30 tablet   . Cholecalciferol (VITAMIN D3) 1000 units CAPS Take 1 capsule (1,000 Units total) by mouth daily. (Patient not taking: Reported on 08/14/2019) 30 capsule   . clonazePAM (KLONOPIN) 1 MG tablet TAKE 1 TABLET TWICE DAILY AS NEEDED (Patient not taking: Reported on 08/14/2019) 60 tablet 0   No facility-administered medications prior to visit.      Per HPI unless specifically indicated in ROS section below Review of Systems  Constitutional: Negative for activity change, appetite change, chills, fatigue, fever and unexpected weight change.  HENT: Negative for hearing loss.   Eyes: Negative for visual disturbance.  Respiratory: Negative for cough, chest tightness, shortness of breath and wheezing.   Cardiovascular: Negative for chest pain, palpitations and leg swelling.  Gastrointestinal: Negative for abdominal distention, abdominal pain, blood in stool, constipation, diarrhea, nausea and vomiting.  Genitourinary: Negative for difficulty urinating and hematuria.  Musculoskeletal: Negative for arthralgias, myalgias and neck pain.  Skin: Negative for rash.  Neurological: Negative for dizziness, seizures, syncope and headaches.  Hematological: Negative for adenopathy. Does not bruise/bleed easily.  Psychiatric/Behavioral: Negative  for dysphoric mood. The patient is not nervous/anxious.    Objective:    BP 132/64 (BP Location: Left Arm, Patient Position: Sitting, Cuff Size: Normal)   Pulse 65   Temp 97.6 F (36.4 C) (Tympanic)   Ht 5' 5.5" (1.664 m)   Wt 152 lb (68.9 kg)   SpO2 97%    BMI 24.91 kg/m   Wt Readings from Last 3 Encounters:  08/21/19 152 lb (68.9 kg)  08/14/19 150 lb (68 kg)  07/24/19 150 lb (68 kg)    Physical Exam Vitals signs and nursing note reviewed.  Constitutional:      General: He is not in acute distress.    Appearance: Normal appearance. He is well-developed. He is not ill-appearing.  HENT:     Head: Normocephalic and atraumatic.     Right Ear: Hearing, tympanic membrane, ear canal and external ear normal.     Left Ear: Hearing, tympanic membrane, ear canal and external ear normal.     Nose: Nose normal.     Mouth/Throat:     Mouth: Mucous membranes are moist.     Pharynx: Uvula midline. No oropharyngeal exudate or posterior oropharyngeal erythema.  Eyes:     General: No scleral icterus.    Extraocular Movements: Extraocular movements intact.     Conjunctiva/sclera: Conjunctivae normal.     Pupils: Pupils are equal, round, and reactive to light.  Neck:     Musculoskeletal: Normal range of motion and neck supple.     Vascular: No carotid bruit.  Cardiovascular:     Rate and Rhythm: Normal rate and regular rhythm.     Pulses: Normal pulses.          Radial pulses are 2+ on the right side and 2+ on the left side.     Heart sounds: Normal heart sounds. No murmur.  Pulmonary:     Effort: Pulmonary effort is normal. No respiratory distress.     Breath sounds: Normal breath sounds. No wheezing, rhonchi or rales.  Abdominal:     General: Abdomen is flat. Bowel sounds are normal. There is no distension.     Palpations: Abdomen is soft. There is no mass.     Tenderness: There is no abdominal tenderness. There is no guarding or rebound.     Hernia: No hernia is present.  Musculoskeletal: Normal range of motion.  Lymphadenopathy:     Cervical: No cervical adenopathy.  Skin:    General: Skin is warm and dry.     Findings: No rash.  Neurological:     General: No focal deficit present.     Mental Status: He is alert and oriented to  person, place, and time.     Comments: CN grossly intact, station and gait intact  Psychiatric:        Mood and Affect: Mood normal.        Behavior: Behavior normal.        Thought Content: Thought content normal.        Judgment: Judgment normal.       Results for orders placed or performed in visit on 08/14/19  Parathyroid hormone, intact (no Ca)  Result Value Ref Range   PTH 49 14 - 64 pg/mL  CBC with Differential/Platelet  Result Value Ref Range   WBC 6.6 4.0 - 10.5 K/uL   RBC 4.94 4.22 - 5.81 Mil/uL   Hemoglobin 14.8 13.0 - 17.0 g/dL   HCT 45.0 39.0 - 52.0 %   MCV 91.2  78.0 - 100.0 fl   MCHC 32.9 30.0 - 36.0 g/dL   RDW 13.6 11.5 - 15.5 %   Platelets 144.0 (L) 150.0 - 400.0 K/uL   Neutrophils Relative % 67.1 43.0 - 77.0 %   Lymphocytes Relative 21.4 12.0 - 46.0 %   Monocytes Relative 8.5 3.0 - 12.0 %   Eosinophils Relative 2.2 0.0 - 5.0 %   Basophils Relative 0.8 0.0 - 3.0 %   Neutro Abs 4.4 1.4 - 7.7 K/uL   Lymphs Abs 1.4 0.7 - 4.0 K/uL   Monocytes Absolute 0.6 0.1 - 1.0 K/uL   Eosinophils Absolute 0.1 0.0 - 0.7 K/uL   Basophils Absolute 0.1 0.0 - 0.1 K/uL  Hemoglobin A1c  Result Value Ref Range   Hgb A1c MFr Bld 5.6 4.6 - 6.5 %  TSH  Result Value Ref Range   TSH 2.74 0.35 - 4.50 uIU/mL  Comprehensive metabolic panel  Result Value Ref Range   Sodium 141 135 - 145 mEq/L   Potassium 4.5 3.5 - 5.1 mEq/L   Chloride 106 96 - 112 mEq/L   CO2 27 19 - 32 mEq/L   Glucose, Bld 82 70 - 99 mg/dL   BUN 18 6 - 23 mg/dL   Creatinine, Ser 1.24 0.40 - 1.50 mg/dL   Total Bilirubin 0.8 0.2 - 1.2 mg/dL   Alkaline Phosphatase 61 39 - 117 U/L   AST 19 0 - 37 U/L   ALT 11 0 - 53 U/L   Total Protein 6.7 6.0 - 8.3 g/dL   Albumin 4.1 3.5 - 5.2 g/dL   Calcium 9.1 8.4 - 10.5 mg/dL   GFR 56.08 (L) >60.00 mL/min  Lipid panel  Result Value Ref Range   Cholesterol 195 0 - 200 mg/dL   Triglycerides 104.0 0.0 - 149.0 mg/dL   HDL 46.80 >39.00 mg/dL   VLDL 20.8 0.0 - 40.0 mg/dL   LDL  Cholesterol 127 (H) 0 - 99 mg/dL   Total CHOL/HDL Ratio 4    NonHDL 148.20   VITAMIN D 25 Hydroxy (Vit-D Deficiency, Fractures)  Result Value Ref Range   VITD 25.65 (L) 30.00 - 100.00 ng/mL   Assessment & Plan:   Problem List Items Addressed This Visit    Renal insufficiency    Need to be cautious with bisphosphonate use, but kidney should tolerate ok.      Osteoporosis    Has not been taking calcium or vitamin D. Advised restart 1000 IU vit D.  Prolia unaffordable. Did not tolerate fosamax.  Advised need to attain normal vit D levels and then would consider yearly reclast. rec schedule 3 mo lab visit to recheck vit D and if normal, may order reclast.       Relevant Medications   Cholecalciferol (VITAMIN D3) 25 MCG (1000 UT) CAPS   Hypertension    Chronic, stable on low dose toprol XL.       Hyperparathyroidism ([place])    Levels have normalized.       Hyperglycemia    A1c remains normal.      HLD (hyperlipidemia)    Chronic, off medication. The ASCVD Risk score Mikey Bussing DC Jr., et al., 2013) failed to calculate for the following reasons:   The 2013 ASCVD risk score is only valid for ages 13 to 47       Health maintenance examination - Primary    Preventative protocols reviewed and updated unless pt declined. Discussed healthy diet and lifestyle.  Elevated PSA    Followed by urology. Has had 3 normal biopsies.       Depression with anxiety    Stable period. Now off klonopin. Wife's caregiver.       BPH with urinary obstruction    Sees urology       Aorto-iliac atherosclerosis ([place])    By imaging. Not on statin or aspirin.       Anxiety    Now off klonopin.        Other Visit Diagnoses    Need for influenza vaccination       Relevant Orders   Flu Vaccine QUAD High Dose(Fluad) (Completed)   Special screening for malignant neoplasms, colon       Relevant Orders   Fecal occult blood, imunochemical       Meds ordered this encounter  Medications   . Cholecalciferol (VITAMIN D3) 25 MCG (1000 UT) CAPS    Sig: Take 1 capsule (1,000 Units total) by mouth daily.    Dispense:  30 capsule   Orders Placed This Encounter  Procedures  . Fecal occult blood, imunochemical    Standing Status:   Future    Standing Expiration Date:   08/20/2020  . Flu Vaccine QUAD High Dose(Fluad)    Patient instructions: Flu shot today Pass by lab to pick up stool kit.  Start vitamin D 1000 units daily every day. Recheck levels in 3 months. If normal, we may start reclast once yearly infusion for osteoporosis.  Schedule lab visit in 3 months to recheck calcium and vitamin D.  Return in 6 months for follow up visit.   Follow up plan: Return in about 6 months (around 02/18/2020) for follow up visit.  [name], MD

## 2019-08-21 NOTE — Patient Instructions (Addendum)
Flu shot today Pass by lab to pick up stool kit.  Start vitamin D 1000 units daily every day. Recheck levels in 3 months. If normal, we may start reclast once yearly infusion for osteoporosis.  Schedule lab visit in 3 months to recheck calcium and vitamin D.  Return in 6 months for follow up visit.   Health Maintenance After Age 80 After age 40, you are at a higher risk for certain long-term diseases and infections as well as injuries from falls. Falls are a major cause of broken bones and head injuries in people who are older than age 26. Getting regular preventive care can help to keep you healthy and well. Preventive care includes getting regular testing and making lifestyle changes as recommended by your health care provider. Talk with your health care provider about:  Which screenings and tests you should have. A screening is a test that checks for a disease when you have no symptoms.  A diet and exercise plan that is right for you. What should I know about screenings and tests to prevent falls? Screening and testing are the best ways to find a health problem early. Early diagnosis and treatment give you the best chance of managing medical conditions that are common after age 75. Certain conditions and lifestyle choices may make you more likely to have a fall. Your health care provider may recommend:  Regular vision checks. Poor vision and conditions such as cataracts can make you more likely to have a fall. If you wear glasses, make sure to get your prescription updated if your vision changes.  Medicine review. Work with your health care provider to regularly review all of the medicines you are taking, including over-the-counter medicines. Ask your health care provider about any side effects that may make you more likely to have a fall. Tell your health care provider if any medicines that you take make you feel dizzy or sleepy.  Osteoporosis screening. Osteoporosis is a condition that causes  the bones to get weaker. This can make the bones weak and cause them to break more easily.  Blood pressure screening. Blood pressure changes and medicines to control blood pressure can make you feel dizzy.  Strength and balance checks. Your health care provider may recommend certain tests to check your strength and balance while standing, walking, or changing positions.  Foot health exam. Foot pain and numbness, as well as not wearing proper footwear, can make you more likely to have a fall.  Depression screening. You may be more likely to have a fall if you have a fear of falling, feel emotionally low, or feel unable to do activities that you used to do.  Alcohol use screening. Using too much alcohol can affect your balance and may make you more likely to have a fall. What actions can I take to lower my risk of falls? General instructions  Talk with your health care provider about your risks for falling. Tell your health care provider if: ? You fall. Be sure to tell your health care provider about all falls, even ones that seem minor. ? You feel dizzy, sleepy, or off-balance.  Take over-the-counter and prescription medicines only as told by your health care provider. These include any supplements.  Eat a healthy diet and maintain a healthy weight. A healthy diet includes low-fat dairy products, low-fat (lean) meats, and fiber from whole grains, beans, and lots of fruits and vegetables. Home safety  Remove any tripping hazards, such as rugs, cords, and  clutter.  Install safety equipment such as grab bars in bathrooms and safety rails on stairs.  Keep rooms and walkways well-lit. Activity   Follow a regular exercise program to stay fit. This will help you maintain your balance. Ask your health care provider what types of exercise are appropriate for you.  If you need a cane or walker, use it as recommended by your health care provider.  Wear supportive shoes that have nonskid  soles. Lifestyle  Do not drink alcohol if your health care provider tells you not to drink.  If you drink alcohol, limit how much you have: ? 0-1 drink a day for women. ? 0-2 drinks a day for men.  Be aware of how much alcohol is in your drink. In the [place] one drink equals one typical bottle of beer (12 oz), one-half glass of wine (5 oz), or one shot of hard liquor (1 oz).  Do not use any products that contain nicotine or tobacco, such as cigarettes and e-cigarettes. If you need help quitting, ask your health care provider. Summary  Having a healthy lifestyle and getting preventive care can help to protect your health and wellness after age 97.  Screening and testing are the best way to find a health problem early and help you avoid having a fall. Early diagnosis and treatment give you the best chance for managing medical conditions that are more common for people who are older than age 4.  Falls are a major cause of broken bones and head injuries in people who are older than age 25. Take precautions to prevent a fall at home.  Work with your health care provider to learn what changes you can make to improve your health and wellness and to prevent falls. This information is not intended to replace advice given to you by your health care provider. Make sure you discuss any questions you have with your health care provider. Document Released: 10/12/2017 Document Revised: 03/22/2019 Document Reviewed: 10/12/2017 Elsevier Patient Education  2020 [employer].

## 2019-08-22 MED ORDER — VITAMIN D3 25 MCG (1000 UT) PO CAPS: 1.0000 | ORAL_CAPSULE | Freq: Every day | ORAL | Status: DC

## 2019-08-22 NOTE — Assessment & Plan Note (Signed)
Now off klonopin.

## 2019-08-22 NOTE — Assessment & Plan Note (Addendum)
Stable period. Now off klonopin. Wife's caregiver.

## 2019-08-22 NOTE — Assessment & Plan Note (Signed)
Levels have normalized.  

## 2019-08-22 NOTE — Assessment & Plan Note (Signed)
Chronic, off medication. The ASCVD Risk score Douglas Bussing DC Jr., et al., 2013) failed to calculate for the following reasons:   The 2013 ASCVD risk score is only valid for ages 53 to 35

## 2019-08-22 NOTE — Assessment & Plan Note (Signed)
Chronic, stable on low dose toprol XL.  

## 2019-08-22 NOTE — Assessment & Plan Note (Signed)
Need to be cautious with bisphosphonate use, but kidney should tolerate ok.

## 2019-08-22 NOTE — Assessment & Plan Note (Signed)
Sees urology

## 2019-08-22 NOTE — Assessment & Plan Note (Signed)
Preventative protocols reviewed and updated unless pt declined. Discussed healthy diet and lifestyle.  

## 2019-08-22 NOTE — Assessment & Plan Note (Signed)
Has not been taking calcium or vitamin D. Advised restart 1000 IU vit D.  Prolia unaffordable. Did not tolerate fosamax.  Advised need to attain normal vit D levels and then would consider yearly reclast. rec schedule 3 mo lab visit to recheck vit D and if normal, may order reclast.

## 2019-08-22 NOTE — Assessment & Plan Note (Signed)
By imaging. Not on statin or aspirin.

## 2019-08-22 NOTE — Assessment & Plan Note (Addendum)
A1c remains normal.

## 2019-08-22 NOTE — Assessment & Plan Note (Signed)
Followed by urology. Has had 3 normal biopsies.

## 2019-08-23 ENCOUNTER — Other Ambulatory Visit (INDEPENDENT_AMBULATORY_CARE_PROVIDER_SITE_OTHER): Payer: Medicare HMO

## 2019-08-23 DIAGNOSIS — Z1211 Encounter for screening for malignant neoplasm of colon: Secondary | ICD-10-CM | POA: Diagnosis not present

## 2019-08-23 LAB — FECAL OCCULT BLOOD, IMMUNOCHEMICAL: Fecal Occult Bld: NEGATIVE

## 2019-08-23 LAB — FECAL OCCULT BLOOD, GUAIAC: Fecal Occult Blood: NEGATIVE

## 2019-08-24 ENCOUNTER — Encounter: Payer: Self-pay | Admitting: Family Medicine

## 2019-10-01 DIAGNOSIS — L578 Other skin changes due to chronic exposure to nonionizing radiation: Secondary | ICD-10-CM | POA: Diagnosis not present

## 2019-10-01 DIAGNOSIS — L603 Nail dystrophy: Secondary | ICD-10-CM | POA: Diagnosis not present

## 2019-10-01 DIAGNOSIS — L570 Actinic keratosis: Secondary | ICD-10-CM | POA: Diagnosis not present

## 2019-10-01 DIAGNOSIS — L820 Inflamed seborrheic keratosis: Secondary | ICD-10-CM | POA: Diagnosis not present

## 2019-10-01 DIAGNOSIS — L821 Other seborrheic keratosis: Secondary | ICD-10-CM | POA: Diagnosis not present

## 2019-10-11 ENCOUNTER — Other Ambulatory Visit: Payer: Self-pay | Admitting: Family Medicine

## 2020-01-03 ENCOUNTER — Ambulatory Visit: Payer: Medicare HMO

## 2020-01-09 DIAGNOSIS — Z961 Presence of intraocular lens: Secondary | ICD-10-CM | POA: Diagnosis not present

## 2020-01-17 ENCOUNTER — Ambulatory Visit: Payer: Medicare HMO | Attending: Internal Medicine

## 2020-01-18 ENCOUNTER — Ambulatory Visit: Payer: Medicare HMO | Attending: Internal Medicine

## 2020-01-18 DIAGNOSIS — Z23 Encounter for immunization: Secondary | ICD-10-CM

## 2020-01-18 NOTE — Progress Notes (Signed)
   Covid-19 Vaccination Clinic  Name:  [name]    MRN: [id number] DOB: February 13, 1941  01/18/2020  [name] was observed post Covid-19 immunization for 15 minutes without incidence. He was provided with Vaccine Information Sheet and instruction to access the V-Safe system.   [name] was instructed to call 911 with any severe reactions post vaccine: [name] Difficulty breathing  . Swelling of your face and throat  . A fast heartbeat  . A bad rash all over your body  . Dizziness and weakness    Immunizations Administered    Name Date Dose VIS Date Route   Pfizer COVID-19 Vaccine 01/18/2020 12:16 PM 0.3 mL 11/23/2019 Intramuscular   Manufacturer: [place]   Lot: [id number]   [place]: [id number]

## 2020-01-21 ENCOUNTER — Other Ambulatory Visit: Payer: Self-pay | Admitting: *Deleted

## 2020-01-21 DIAGNOSIS — R972 Elevated prostate specific antigen [PSA]: Secondary | ICD-10-CM

## 2020-01-22 ENCOUNTER — Other Ambulatory Visit: Payer: Medicare HMO

## 2020-01-22 ENCOUNTER — Other Ambulatory Visit: Payer: Self-pay

## 2020-01-22 DIAGNOSIS — R972 Elevated prostate specific antigen [PSA]: Secondary | ICD-10-CM

## 2020-01-23 LAB — PSA: Prostate Specific Ag, Serum: 23.5 ng/mL — ABNORMAL HIGH (ref 0.0–4.0)

## 2020-01-24 ENCOUNTER — Ambulatory Visit: Payer: Medicare HMO | Admitting: Urology

## 2020-02-12 ENCOUNTER — Ambulatory Visit: Payer: Medicare HMO | Attending: Internal Medicine

## 2020-02-12 DIAGNOSIS — Z23 Encounter for immunization: Secondary | ICD-10-CM

## 2020-02-12 NOTE — Progress Notes (Signed)
   Covid-19 Vaccination Clinic  Name:  [name]    MRN: [id number] DOB: Jun 18, 1941  02/12/2020  [name] was observed post Covid-19 immunization for 15 minutes without incident. He was provided with Vaccine Information Sheet and instruction to access the V-Safe system.   [name] was instructed to call 911 with any severe reactions post vaccine: [name] Difficulty breathing  . Swelling of face and throat  . A fast heartbeat  . A bad rash all over body  . Dizziness and weakness   Immunizations Administered    Name Date Dose VIS Date Route   Pfizer COVID-19 Vaccine 02/12/2020 12:04 PM 0.3 mL 11/23/2019 Intramuscular   Manufacturer: [place]   Lot: [id number]   [place]: [id number]

## 2020-02-21 ENCOUNTER — Ambulatory Visit
Admission: RE | Admit: 2020-02-21 | Discharge: 2020-02-21 | Disposition: A | Discharge status: Home / Self Care | Payer: Medicare HMO | Source: Ambulatory Visit | Attending: Urology | Admitting: Urology

## 2020-02-21 ENCOUNTER — Ambulatory Visit: Payer: Medicare HMO | Admitting: Urology

## 2020-02-21 ENCOUNTER — Other Ambulatory Visit: Payer: Self-pay

## 2020-02-21 VITALS — BP 128/74 | HR 76 | Ht 67.0 in | Wt 152.0 lb

## 2020-02-21 DIAGNOSIS — N200 Calculus of kidney: Secondary | ICD-10-CM | POA: Insufficient documentation

## 2020-02-21 DIAGNOSIS — N401 Enlarged prostate with lower urinary tract symptoms: Secondary | ICD-10-CM | POA: Diagnosis not present

## 2020-02-21 DIAGNOSIS — R972 Elevated prostate specific antigen [PSA]: Secondary | ICD-10-CM

## 2020-02-21 DIAGNOSIS — N138 Other obstructive and reflux uropathy: Secondary | ICD-10-CM | POA: Diagnosis not present

## 2020-02-21 NOTE — Progress Notes (Signed)
02/21/2020 9:20 AM   [name] 1940/12/18 [id number]  Referring provider: [name], MD [address] [place],  [place] [address]  Chief Complaint  Patient presents with  . Benign Prostatic Hypertrophy    HPI: Patient is a 79 year old male with recurrent kidney stones, BPH with LUTS, elevated PSA and urge incontinence who presents today for 6 month follow-up.  Recurrent kidney stones He is s/p left URS/LL/ureteral stent placement with stent removal in 10/2014, right ESWL in 12/2014.  Metabolic work up indicated a low volume of urine at 1.47 L and low urinary citrate. Urinary pH 5.56.  His 24 hour urine magnesium was borderline low.  No other obvious metabolic derangements.   KUB 02/21/2020 Cluster of calculi seen in the left hemiabdomen, likely in the left kidney approximately 7, largest in the 5-6 mm range.  Renal calculi on the right is not as well seen.  Potential mild increased loss of height at L1 compared to prior imaging evaluations.  He denies any flank pain, passage of fragments or gross hematuria.  BPH WITH LUTS His IPSS score today is 3, which is mild lower urinary tract symptomatology.  He is delighted with his quality life due to his urinary symptoms.  His previous I PSS score was 6/1.  His previous PVR was 98 mL.  Patient denies any modifying or aggravating factors.  Patient denies any gross hematuria, dysuria or suprapubic/flank pain.  Patient denies any fevers, chills, nausea or vomiting.   IPSS    Row Name 02/21/20 1400         International Prostate Symptom Score   How often have you had the sensation of not emptying your bladder?  Not at All     How often have you had to urinate less than every two hours?  Not at All     How often have you found you stopped and started again several times when you urinated?  Less than 1 in 5 times     How often have you found it difficult to postpone urination?  Not at All     How often have you had a weak  urinary stream?  Not at All     How often have you had to strain to start urination?  Not at All     How many times did you typically get up at night to urinate?  2 Times     Total IPSS Score  3       Quality of Life due to urinary symptoms   If you were to spend the rest of your life with your urinary condition just the way it is now how would you feel about that?  Delighted        Score:  1-7 Mild 8-19 Moderate 20-35 Severe  Elevated PSA Patient has had a history of s/p negative biopsy x 3. Awaiting records after numerous requests from patient and [name] office.  Prostate MRI in 12/2018 revealed no radiographic evidence of high-grade prostate carcinoma. PI-RADS 1: Very Low (clinically significant cancer is highly unlikely to be present).  PSAD  0.131  04/2009   15.34 06/2013   13.04 11/2014   15.00 02/2015    13.3 Component      Prostate Specific Ag, Serum  Latest Ref Rng & Units      0.0 - 4.0 ng/mL  07/30/2015      15.1 (H)  07/22/2016      13.8 (H)  07/28/2016  15.2 (H)  03/01/2017      16.6 (H)  03/08/2017      17.5 (H)  06/09/2017     1:57 PM 14.9 (H)  06/09/2017     1:57 PM 14.8 (H)  09/01/2017      16.5 (H)  03/06/2018      15.7 (H)  08/30/2018      21.2 (H)  07/20/2019      15.0 (H)  01/22/2020      23.5 (H)  02/21/2020      17.0 (H)   PMH: Past Medical History:  Diagnosis Date  . BPH with urinary obstruction    sees urology Dr. [name]  . Carotid stenosis 07/20/2016   Minimal on [place] 07/2016. Monitor clinically.  . Depression with anxiety   . Elevated PSA 2014   sees urology - normal biopsies x 3  . HLD (hyperlipidemia)   . Left kidney mass 09/2014   on CT scan thought consistent with simple cyst on prior US  . Nasal obstruction    congenital R sided  . Obstructive pyelonephritis 09/2014   s/p hospitalization and treatment with levaquin  . OSA (obstructive sleep apnea)    pt denies  . Osteoporosis 06/04/2018   DEXA T score -37 at L spine  .  Recurrent kidney stones 2014   extensive bilateral nephrolithiasis, sees urology [name]), hydrocodone prn  . Symptomatic PVCs    per prior cardiologist, on beta blocker  . Urge incontinence    thought overactive bladder    Surgical History: Past Surgical History:  Procedure Laterality Date  . CARDIAC CATHETERIZATION  03/2012   EF 45-50%, no LVH, + impaired LV relaxation, no PH, no valve abnormalities  . CARDIOVASCULAR STRESS TEST  03/2012   mild peri infarct ischemia in apical segment, decreased EF  . CATARACT EXTRACTION Right 01/2013  . COLONOSCOPY  02/2008   mod diverticulosis o/w WNL (2009)  . CYSTOSCOPY    . CYSTOSCOPY  10/2014   s/p stone removal, with stent removal [name])  . TONSILECTOMY, ADENOIDECTOMY, BILATERAL MYRINGOTOMY AND TUBES     removed as a child  . US ECHOCARDIOGRAPHY  03/2012   hypokinetic anterior wall, EF 45-50%, impaired relaxation pattern, mild LA dilation, mild-mod MR, mild-mod AR    Home Medications:  Allergies as of 02/21/2020   No Known Allergies     Medication List       Accurate as of February 21, 2020 11:59 PM. If you have any questions, ask your nurse or doctor.        metoprolol succinate 25 MG 24 hr tablet Commonly known as: TOPROL-XL TAKE 1/2 TABLET EVERY DAY   One-A-Day Mens 50+ Advantage Tabs Take by mouth.   PRESCRIPTION MEDICATION Place into the right eye at bedtime. Eye ointment   Vitamin D3 25 MCG (1000 UT) Caps Take 1 capsule (1,000 Units total) by mouth daily.       Allergies: No Known Allergies  Family History: Family History  Problem Relation Age of Onset  . Urolithiasis Daughter   . Kidney Stones Mother   . Diabetes Neg Hx   . Cancer Neg Hx   . CAD Neg Hx   . Stroke Neg Hx   . Hypertension Neg Hx   . Prostate cancer Neg Hx   . Kidney cancer Neg Hx   . Bladder Cancer Neg Hx     Social History:  reports that he has never smoked. He has never used smokeless tobacco. He reports that  he does not drink alcohol or  use drugs.  ROS: For pertinent review of systems please refer to history of present illness  Physical Exam: BP 128/74   Pulse 76   Ht 5\' 7"  (1.702 m)   Wt 152 lb (68.9 kg)   BMI 23.81 kg/m   Constitutional:  Well nourished. Alert and oriented, No acute distress. HEENT: [place] AT, mask in place.  Trachea midline, no masses. Cardiovascular: No clubbing, cyanosis, or edema. Respiratory: Normal respiratory effort, no increased work of breathing. GI: Abdomen is soft, non tender, non distended, no abdominal masses.   GU: No CVA tenderness.  No bladder fullness or masses.  Patient with uncircumcised phallus. Foreskin easily retracted  Urethral meatus is patent.  No penile discharge. No penile lesions or rashes. Scrotum without lesions, cysts, rashes and/or edema.  Testicles are located scrotally bilaterally. No masses are appreciated in the testicles. Left and right epididymis are normal. Rectal: Patient with  normal sphincter tone. Anus and perineum without scarring or rashes. No rectal masses are appreciated. Prostate is approximately 70 +grams, could only palpate the apex in the midportion of the gland, no nodules are appreciated. Seminal vesicles could not be palpated. Skin: No rashes, bruises or suspicious lesions. Lymph: No inguinal adenopathy. Neurologic: Grossly intact, no focal deficits, moving all 4 extremities. Psychiatric: Normal mood and affect.  Laboratory Data: PSA up in HPI  Lab Results  Component Value Date   CREATININE 1.24 08/14/2019       Component Value Date/Time   CHOL 195 08/14/2019 1555   HDL 46.80 08/14/2019 1555   CHOLHDL 4 08/14/2019 1555   VLDL 20.8 08/14/2019 1555   LDLCALC 127 (H) 08/14/2019 1555    Lab Results  Component Value Date   AST 19 08/14/2019   Lab Results  Component Value Date   ALT 11 08/14/2019   I have reviewed labs  Pertinent imaging CLINICAL DATA:  Flank pain history of kidney stones.  EXAM: ABDOMEN - 1 VIEW  COMPARISON:   09/08/2017  FINDINGS: Cluster of calculi seen in the left hemiabdomen, likely in the left kidney approximately 7, largest in the 5-6 mm range.  Potential calculus in the upper pole the right kidney measuring approximately 4 mm.  Stool and gas overlying the renal contours limits assessment. Smaller calculi seen on the prior study in the right kidney are not visualized.  Bowel gas pattern is nonobstructive.  Signs of osteopenia, spinal degenerative change in prior compression fractures most notably at the T11, T12 and L1 levels. Levels.  There may be further loss of height at L1 when compared to prior imaging.  IMPRESSION: 1. Cluster of calculi seen in the left hemiabdomen, likely in the left kidney approximately 7, largest in the 5-6 mm range. 2. Renal calculi on the right is not as well seen. 3. Potential mild increased loss of height at L1 compared to prior imaging evaluations.   Electronically Signed   By: Zetta Bills M.D.   On: 02/22/2020 08:44 I have independently reviewed the films and compared to previous KUB in 2018.  The stones appear mostly stable.    Assessment & Plan:    1. Elevated PSA Negative biopsy x 3 Negative prostate MRI in 12/2018 PSA 23.5 at the time of this visit Discussed repeating the MRI vs biopsy at this time.   I reviewed how a biopsy is performed and the risk involved, such as blood in urine, blood in stool, blood in semen, infection, urinary retention, and on  rare occasions sepsis and death We will repeat the PSA today and if it does not return to baseline, he would like to consider a repeat prostate MRI Repeated PSA returned at 17.0, which is his baseline He will RTC in 6 months for PSA and exam  2. Recurrent nephrolithiasis KUB demonstrates a cluster of left renal stones which are likely stable Asymptomatic at this time He is not wanting any intervention at this time as he states his wife is not doing well Advised to contact  our office or seek treatment in the ED if becomes febrile or pain/ vomiting are difficult control in order to arrange for emergent/urgent intervention  3. BPH with LUTS IPSS score is 3/0, it is improving  Continue conservative management, avoiding bladder irritants and timed voiding's RTC in 6 months for PSA, I PSS and exam      Return in 6 months (on 08/23/2020) for IPSS, PSA and exam.  These notes generated with voice recognition software. I apologize for typographical errors.  [name], [name]  [facility] [address] [place], [place] [address] [phone]

## 2020-02-22 ENCOUNTER — Other Ambulatory Visit: Payer: Self-pay | Admitting: Family Medicine

## 2020-02-22 ENCOUNTER — Telehealth: Payer: Self-pay | Admitting: Family Medicine

## 2020-02-22 DIAGNOSIS — R972 Elevated prostate specific antigen [PSA]: Secondary | ICD-10-CM

## 2020-02-22 LAB — PSA: Prostate Specific Ag, Serum: 17 ng/mL — ABNORMAL HIGH (ref 0.0–4.0)

## 2020-02-22 NOTE — Telephone Encounter (Signed)
-----   Message from Nori Riis, [name] sent at 02/22/2020  7:57 AM EST ----- Please let Mr. Hedges know that his PSA has decreased to 17.  I would like to see him again in 6 months with a PSA prior.

## 2020-02-22 NOTE — Telephone Encounter (Signed)
Patient notified and voiced understanding. Appointment has been scheduled. 

## 2020-02-25 ENCOUNTER — Telehealth: Payer: Self-pay | Admitting: Family Medicine

## 2020-02-25 NOTE — Telephone Encounter (Signed)
Patient's daughter Claiborne Billings left a vmail to clarify results. Attempted to reach and left vmail for her to call

## 2020-02-25 NOTE — Telephone Encounter (Signed)
Patient's daughter was notified of results and to follow up with Dr. Danise Mina

## 2020-02-25 NOTE — Telephone Encounter (Signed)
Patient notified and voiced understanding. He is going to reach out to Dr. Darnell Level and get advise on the bone density.

## 2020-02-25 NOTE — Telephone Encounter (Signed)
-----   Message from Nori Riis, [name] sent at 02/25/2020  8:48 AM EDT ----- Please let Mr. Mello know that his x-rays shows 7 stones in his left kidney and 1 stone in his right kidney.  The largest ones being in the 6 mm range.  There is also signs of thinning of his bones and compression fractures in his spine.  He should speak with Dr. Danise Mina to see if he should be taking something for his bone density.

## 2020-02-26 ENCOUNTER — Encounter: Payer: Self-pay | Admitting: Family Medicine

## 2020-02-26 ENCOUNTER — Other Ambulatory Visit: Payer: Self-pay

## 2020-02-26 ENCOUNTER — Ambulatory Visit (INDEPENDENT_AMBULATORY_CARE_PROVIDER_SITE_OTHER): Payer: Medicare HMO | Admitting: Family Medicine

## 2020-02-26 VITALS — BP 136/64 | HR 56 | Temp 97.9°F | Ht 67.0 in | Wt 153.0 lb

## 2020-02-26 DIAGNOSIS — N289 Disorder of kidney and ureter, unspecified: Secondary | ICD-10-CM | POA: Diagnosis not present

## 2020-02-26 DIAGNOSIS — M818 Other osteoporosis without current pathological fracture: Secondary | ICD-10-CM | POA: Diagnosis not present

## 2020-02-26 DIAGNOSIS — Z8781 Personal history of (healed) traumatic fracture: Secondary | ICD-10-CM

## 2020-02-26 DIAGNOSIS — E213 Hyperparathyroidism, unspecified: Secondary | ICD-10-CM | POA: Diagnosis not present

## 2020-02-26 NOTE — Patient Instructions (Addendum)
Find low dose vitamin D (like 400 units) and take daily - find the most you can tolerate daily.  Make sure you're getting 1231m calcium daily Check with insurance about coverage for once daily Reclast medicine (zolendronic acid).  Continue avoiding heavy lifting.   Try to get most or all of your calcium from your food--aim for 1000 mg/day for women up to 580and men up to 70 and 1200 mg/day for women over 576and men over 70.  To figure out dietary calcium: 300 mg/day from all non dairy foods plus 300 mg per cup of milk, other dairy, or fortified juice. Non dairy foods that contain calcium: Kale, oranges, sardines, oatmeal, soy milk/soybeans, salmon, white beans, dried figs, turnip greens, almonds, broccoli, tofu.   Osteoporosis  Osteoporosis is thinning and loss of density in your bones. Osteoporosis makes bones more brittle and fragile and more likely to break (fracture). Over time, osteoporosis can cause your bones to become so weak that they fracture after a minor fall. Bones in the hip, wrist, and spine are most likely to fracture due to osteoporosis. What are the causes? The exact cause of this condition is not known. What increases the risk? You may be at greater risk for osteoporosis if you:  Have a family history of the condition.  Have poor nutrition.  Use steroid medicines, such as prednisone.  Are male.  Are age 8130or older.  Smoke or have a history of smoking.  Are not physically active (are sedentary).  Are white (Caucasian) or of Asian descent.  Have a small body frame.  Take certain medicines, such as antiseizure medicines. What are the signs or symptoms? A fracture might be the first sign of osteoporosis, especially if the fracture results from a fall or injury that usually would not cause a bone to break. Other signs and symptoms include:  Pain in the neck or low back.  Stooped posture.  Loss of height. How is this diagnosed? This condition may be  diagnosed based on:  Your medical history.  A physical exam.  A bone mineral density test, also called a DXA or DEXA test (dual-energy X-ray absorptiometry test). This test uses X-rays to measure the amount of minerals in your bones. How is this treated? The goal of treatment is to strengthen your bones and lower your risk for a fracture. Treatment may involve:  Making lifestyle changes, such as: ? Including foods with more calcium and vitamin D in your diet. ? Doing weight-bearing and muscle-strengthening exercises. ? Stopping tobacco use. ? Limiting alcohol intake.  Taking medicine to slow the process of bone loss or to increase bone density.  Taking daily supplements of calcium and vitamin D.  Taking hormone replacement medicines, such as estrogen for women and testosterone for men.  Monitoring your levels of calcium and vitamin D. Follow these instructions at home:  Activity  Exercise as told by your health care provider. Ask your health care provider what exercises and activities are safe for you. You should do: ? Exercises that make you work against gravity (weight-bearing exercises), such as tai chi, yoga, or walking. ? Exercises to strengthen muscles, such as lifting weights. Lifestyle  Limit alcohol intake to no more than 1 drink a day for nonpregnant women and 2 drinks a day for men. One drink equals 12 oz of beer, 5 oz of wine, or 1 oz of hard liquor.  Do not use any products that contain nicotine or tobacco, such as cigarettes and  e-cigarettes. If you need help quitting, ask your health care provider. Preventing falls  Use devices to help you move around (mobility aids) as needed, such as canes, walkers, scooters, or crutches.  Keep rooms well-lit and clutter-free.  Remove tripping hazards from walkways, including cords and throw rugs.  Install grab bars in bathrooms and safety rails on stairs.  Use rubber mats in the bathroom and other areas that are often  wet or slippery.  Wear closed-toe shoes that fit well and support your feet. Wear shoes that have rubber soles or low heels.  Review your medicines with your health care provider. Some medicines can cause dizziness or changes in blood pressure, which can increase your risk of falling. General instructions  Include calcium and vitamin D in your diet. Calcium is important for bone health, and vitamin D helps your body to absorb calcium. Good sources of calcium and vitamin D include: ? Certain fatty fish, such as salmon and tuna. ? Products that have calcium and vitamin D added to them (fortified products), such as fortified cereals. ? Egg yolks. ? Cheese. ? Liver.  Take over-the-counter and prescription medicines only as told by your health care provider.  Keep all follow-up visits as told by your health care provider. This is important. Contact a health care provider if:  You have never been screened for osteoporosis and you are: ? A woman who is age 87 or older. ? A man who is age 94 or older. Get help right away if:  You fall or injure yourself. Summary  Osteoporosis is thinning and loss of density in your bones. This makes bones more brittle and fragile and more likely to break (fracture),even with minor falls.  The goal of treatment is to strengthen your bones and reduce your risk for a fracture.  Include calcium and vitamin D in your diet. Calcium is important for bone health, and vitamin D helps your body to absorb calcium.  Talk with your health care provider about screening for osteoporosis if you are a woman who is age 50 or older, or a man who is age 23 or older. This information is not intended to replace advice given to you by your health care provider. Make sure you discuss any questions you have with your health care provider. Document Revised: 11/11/2017 Document Reviewed: 09/23/2017 Elsevier Patient Education  2020 [employer].

## 2020-02-26 NOTE — Assessment & Plan Note (Signed)
Mild. Would need to monitor if bisphosphonate started.

## 2020-02-26 NOTE — Assessment & Plan Note (Signed)
Has not been taking calcium or vitamin D. States vit D 1000 IU caused nausea. Advised find highest dose tolerated and take daily. rec start at 400 IU daily.  Reviewed calcium recommendations, get most from diet, if not reaching goals, then start calcium supplement.  Did not tolerate oral bisphosphonate. Could not afford prolia. I asked him to price out reclast with his insurance. This is likely best option available. Discussed would need BMP and vit D level prior to undergoing infusion.  If unable to do this, last option would be terepatadine for no more than 2 years.

## 2020-02-26 NOTE — Progress Notes (Signed)
This visit was conducted in person.  BP 136/64 (BP Location: Left Arm, Patient Position: Sitting, Cuff Size: Normal)   Pulse (!) 56   Temp 97.9 F (36.6 C) (Temporal)   Ht 5\' 7"  (1.702 m)   Wt 153 lb (69.4 kg)   SpO2 99%   BMI 23.96 kg/m    CC: f/u visit  Subjective:    Patient ID: [name], male    DOB: 01-Jul-1941, 79 y.o.   MRN: [id number]  HPI: [name] is a 79 y.o. male presenting on 02/26/2020 for Follow-up (Here for f/u after urology visit.) and Referral (Requests referral for BMD. )   Recently saw urology with xrays showing multiple kidney stones L>R.  Xray also showed thinning bones as well as chronic compression fractures at T11, T12, L1.   From last visit 08/2019: Osteoporosis - DEXA T score -3.7 at L spine(05/2018). Did not tolerate fosamax. prolia was unaffordable. Will check on yearly reclast. Vit D levels remain low - has not been taking replacement. Advised restart 1000 IU vit D3. Advised need to attain normal vit D levels and then would consider yearly reclast. rec schedule 3 mo lab visit to recheck vit D and if normal, may order reclast.   No significant back pain unless prolonged period of time on his feet.  MVI and vit D 1000 units upset his stomach so he stopped it.   No upcoming dental work planned.   DG Abd 1 View CLINICAL DATA:  Flank pain history of kidney stones.  EXAM: ABDOMEN - 1 VIEW  COMPARISON:  09/08/2017  FINDINGS: Cluster of calculi seen in the left hemiabdomen, likely in the left kidney approximately 7, largest in the 5-6 mm range.  Potential calculus in the upper pole the right kidney measuring approximately 4 mm.  Stool and gas overlying the renal contours limits assessment. Smaller calculi seen on the prior study in the right kidney are not visualized.  Bowel gas pattern is nonobstructive.  Signs of osteopenia, spinal degenerative change in prior compression fractures most notably at the T11, T12 and L1 levels.  Levels.  There may be further loss of height at L1 when compared to prior imaging.  IMPRESSION: 1. Cluster of calculi seen in the left hemiabdomen, likely in the left kidney approximately 7, largest in the 5-6 mm range. 2. Renal calculi on the right is not as well seen. 3. Potential mild increased loss of height at L1 compared to prior imaging evaluations.  Electronically Signed   By: Zetta Bills M.D.   On: 02/22/2020 08:44       Relevant past medical, surgical, family and social history reviewed and updated as indicated. Interim medical history since our last visit reviewed. Allergies and medications reviewed and updated. Outpatient Medications Prior to Visit  Medication Sig Dispense Refill  . metoprolol succinate (TOPROL-XL) 25 MG 24 hr tablet TAKE 1/2 TABLET EVERY DAY 45 tablet 3  . Cholecalciferol (VITAMIN D3) 25 MCG (1000 UT) CAPS Take 1 capsule (1,000 Units total) by mouth daily. 30 capsule   . Multiple Vitamins-Minerals (ONE-A-DAY MENS 50+ ADVANTAGE) TABS Take by mouth.    [name] PRESCRIPTION MEDICATION Place into the right eye at bedtime. Eye ointment     No facility-administered medications prior to visit.     Per HPI unless specifically indicated in ROS section below Review of Systems Objective:    BP 136/64 (BP Location: Left Arm, Patient Position: Sitting, Cuff Size: Normal)   Pulse (!) 56   Temp  97.9 F (36.6 C) (Temporal)   Ht 5\' 7"  (1.702 m)   Wt 153 lb (69.4 kg)   SpO2 99%   BMI 23.96 kg/m   Wt Readings from Last 3 Encounters:  02/26/20 153 lb (69.4 kg)  02/21/20 152 lb (68.9 kg)  08/21/19 152 lb (68.9 kg)    Physical Exam Vitals and nursing note reviewed.  Constitutional:      Appearance: Normal appearance. He is not ill-appearing.  Cardiovascular:     Rate and Rhythm: Normal rate and regular rhythm.     Pulses: Normal pulses.     Heart sounds: Normal heart sounds. No murmur.  Pulmonary:     Effort: Pulmonary effort is normal. No respiratory  distress.     Breath sounds: Normal breath sounds. No wheezing, rhonchi or rales.  Musculoskeletal:     Right lower leg: No edema.     Left lower leg: No edema.     Comments:  No pain midline spine No paraspinous mm tenderness  Neurological:     Mental Status: He is alert.  Psychiatric:        Mood and Affect: Mood normal.        Behavior: Behavior normal.        Assessment & Plan:  This visit occurred during the SARS-CoV-2 public health emergency.  Safety protocols were in place, including screening questions prior to the visit, additional usage of staff PPE, and extensive cleaning of exam room while observing appropriate contact time as indicated for disinfecting solutions.   Problem List Items Addressed This Visit    Renal insufficiency    Mild. Would need to monitor if bisphosphonate started.       Osteoporosis - Primary    Has not been taking calcium or vitamin D. States vit D 1000 IU caused nausea. Advised find highest dose tolerated and take daily. rec start at 400 IU daily.  Reviewed calcium recommendations, get most from diet, if not reaching goals, then start calcium supplement.  Did not tolerate oral bisphosphonate. Could not afford prolia. I asked him to price out reclast with his insurance. This is likely best option available. Discussed would need BMP and vit D level prior to undergoing infusion.  If unable to do this, last option would be terepatadine for no more than 2 years.       Hyperparathyroidism ([place])    Initial test borderline elevated, confirmatory testing (rpt PTH, 24 hr urine calcium) returned normal.       History of vertebral compression fracture       No orders of the defined types were placed in this encounter.  No orders of the defined types were placed in this encounter.   Patient instructions: Find low dose vitamin D (like 400 units) and take daily - find the most you can tolerate daily.  Make sure you're getting 1200mg  calcium  daily Check with insurance about coverage for once daily Reclast medicine (zolendronic acid).  Continue avoiding heavy lifting.   Try to get most or all of your calcium from your food--aim for 1000 mg/day for women up to 91 and men up to 70 and 1200 mg/day for women over 34 and men over 70.  To figure out dietary calcium: 300 mg/day from all non dairy foods plus 300 mg per cup of milk, other dairy, or fortified juice. Non dairy foods that contain calcium: Kale, oranges, sardines, oatmeal, soy milk/soybeans, salmon, white beans, dried figs, turnip greens, almonds, broccoli, tofu.  Follow up  plan: No follow-ups on file.  [name], MD

## 2020-02-26 NOTE — Assessment & Plan Note (Addendum)
Initial test borderline elevated, confirmatory testing (rpt PTH, 24 hr urine calcium) returned normal.

## 2020-02-28 ENCOUNTER — Encounter: Payer: Self-pay | Admitting: Urology

## 2020-03-26 ENCOUNTER — Other Ambulatory Visit: Payer: Self-pay

## 2020-03-26 ENCOUNTER — Ambulatory Visit: Payer: Medicare HMO | Admitting: Dermatology

## 2020-03-26 ENCOUNTER — Encounter: Payer: Self-pay | Admitting: Dermatology

## 2020-03-26 DIAGNOSIS — Z85828 Personal history of other malignant neoplasm of skin: Secondary | ICD-10-CM

## 2020-03-26 DIAGNOSIS — Z1283 Encounter for screening for malignant neoplasm of skin: Secondary | ICD-10-CM

## 2020-03-26 DIAGNOSIS — L570 Actinic keratosis: Secondary | ICD-10-CM

## 2020-03-26 DIAGNOSIS — L603 Nail dystrophy: Secondary | ICD-10-CM | POA: Diagnosis not present

## 2020-03-26 DIAGNOSIS — Z86007 Personal history of in-situ neoplasm of skin: Secondary | ICD-10-CM

## 2020-03-26 DIAGNOSIS — D1800 Hemangioma unspecified site: Secondary | ICD-10-CM | POA: Diagnosis not present

## 2020-03-26 DIAGNOSIS — L578 Other skin changes due to chronic exposure to nonionizing radiation: Secondary | ICD-10-CM | POA: Diagnosis not present

## 2020-03-26 DIAGNOSIS — L821 Other seborrheic keratosis: Secondary | ICD-10-CM | POA: Diagnosis not present

## 2020-03-26 MED ORDER — FLUCONAZOLE 200 MG PO TABS: 200.0000 mg | ORAL_TABLET | ORAL | 5 refills | Status: DC

## 2020-03-26 NOTE — Patient Instructions (Signed)

## 2020-03-26 NOTE — Progress Notes (Signed)
   Follow-Up Visit   Subjective  [name] is a 79 y.o. male who presents for the following: Nail dystrophy (R 2nd toenail, Ciclopirox 8% sol qd) and Annual Exam (TBSE hx SCC x 2). Patient presents for total body skin exam and skin cancer screening.  The following portions of the chart were reviewed this encounter and updated as appropriate: Tobacco  Allergies  Meds  Problems  Med Hx  Surg Hx  Fam Hx      Review of Systems: No other skin or systemic complaints.  Objective  Well appearing patient in no apparent distress; mood and affect are within normal limits.  A full examination was performed including scalp, head, eyes, ears, nose, lips, neck, chest, axillae, abdomen, back, buttocks, bilateral upper extremities, bilateral lower extremities, hands, feet, fingers, toes, fingernails, and toenails. All findings within normal limits unless otherwise noted below.  Objective  Scalp x 2, face/ear x 6 (8): Erythematous thin papules/macules with gritty scale.   Objective  crown of scalp: Well healed scar with no evidence of recurrence, no lymphadenopathy.   Objective  ant to crown posterior: Well healed scar with no evidence of recurrence.   Objective  R 2nd toenail: Thickened toenail with erythema in nailfold area  Images      Assessment & Plan   Actinic Damage - diffuse scaly erythematous macules with underlying dyspigmentation - Recommend daily broad spectrum sunscreen SPF 30+ to sun-exposed areas, reapply every 2 hours as needed.  - Call for new or changing lesions.  Skin cancer screening performed today.  Seborrheic Keratoses - Stuck-on, waxy, tan-brown papules and plaques  - Discussed benign etiology and prognosis. - Observe - Call for any changes Hemangiomas - Red papules - Discussed benign nature - Observe - Call for any changes  AK (actinic keratosis) (8) Scalp x 2, face/ear x 6  Destruction of lesion - Scalp x 2, face/ear x 6 Complexity:  simple   Destruction method: cryotherapy   Informed consent: discussed and consent obtained   Timeout:  patient name, date of birth, surgical site, and procedure verified Lesion destroyed using liquid nitrogen: Yes   Region frozen until ice ball extended beyond lesion: Yes   Outcome: patient tolerated procedure well with no complications   Post-procedure details: wound care instructions given    History of SCC (squamous cell carcinoma) of skin crown of scalp  Clear. Observe for recurrence. Call clinic for new or changing lesions.  Recommend regular skin exams, daily broad-spectrum spf 30+ sunscreen use, and photoprotection.     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 Observe for recurrence. Call clinic for new or changing lesions.  Recommend regular skin exams, daily broad-spectrum spf 30+ sunscreen use, and photoprotection.     Nail dystrophy R 2nd toenail  Start Diflucan 200mg  1 po q wk for 5 months Cont Ciclopirox sol qhs  fluconazole (DIFLUCAN) 200 MG tablet - R 2nd toenail We discussed removal of nail but patient declines today.  Return in about 6 months (around 09/25/2020) for AK f/u, Toenail dystrophy.   I, Othelia Pulling, RMA, am acting as scribe for [name], MD . Documentation: I have reviewed the above documentation for accuracy and completeness, and I agree with the above.  [name], MD

## 2020-03-27 ENCOUNTER — Encounter: Payer: Self-pay | Admitting: Dermatology

## 2020-07-14 ENCOUNTER — Telehealth: Payer: Self-pay | Admitting: *Deleted

## 2020-07-14 NOTE — Telephone Encounter (Signed)
Patient's wife notified as instructed by telephone and verbalized understanding. 

## 2020-07-14 NOTE — Telephone Encounter (Signed)
This may be stomach bug if his wife was similarly ill last week.  Recommend bland diet, ok to continue pepto bismol, small sips of clear fluids throughout the day (ginger ale, gatorade, jello, chicken broth).  If worsening abdominal pain, diarrhea or fever would recommend in-person evaluation at [facility]. Otherwise let us know if not improving for virtual visit.

## 2020-07-14 NOTE — Telephone Encounter (Signed)
Patient's wife called stating that her husband is having abdominal pain and diarrhea. Patient got on the phone and stated that he started with abdominal pain around 5:00 this morning and the pain level was #10. Patient stated that he having diarrhea. Patient stated that his wife gave him Pepto bismol and tylenol which has helped some. Patient stated that his pain level now is about a 2-3. Patient denies any other symptoms. Patient's wife stated that she had a stomach virus last week. Patient took his temperature and it was 97.4 and he is urinating as usual. Patient's wife wants to know what Dr. Danise Mina recommends.

## 2020-07-15 ENCOUNTER — Other Ambulatory Visit: Payer: Self-pay

## 2020-07-15 ENCOUNTER — Telehealth: Payer: Self-pay | Admitting: Urology

## 2020-07-15 ENCOUNTER — Other Ambulatory Visit: Payer: Self-pay | Admitting: Urology

## 2020-07-15 ENCOUNTER — Ambulatory Visit (INDEPENDENT_AMBULATORY_CARE_PROVIDER_SITE_OTHER): Payer: Medicare HMO | Admitting: Urology

## 2020-07-15 ENCOUNTER — Other Ambulatory Visit: Payer: Self-pay | Admitting: *Deleted

## 2020-07-15 ENCOUNTER — Other Ambulatory Visit: Payer: Self-pay | Admitting: Family Medicine

## 2020-07-15 ENCOUNTER — Ambulatory Visit
Admission: RE | Admit: 2020-07-15 | Discharge: 2020-07-15 | Disposition: A | Discharge status: Home / Self Care | Payer: Medicare HMO | Source: Ambulatory Visit | Attending: Urology | Admitting: Urology

## 2020-07-15 ENCOUNTER — Ambulatory Visit
Admission: RE | Admit: 2020-07-15 | Discharge: 2020-07-15 | Disposition: A | Discharge status: Home / Self Care | Payer: Medicare HMO | Attending: Urology | Admitting: Urology

## 2020-07-15 ENCOUNTER — Encounter: Payer: Self-pay | Admitting: Urology

## 2020-07-15 VITALS — BP 151/84 | HR 74 | Ht 67.0 in | Wt 153.0 lb

## 2020-07-15 DIAGNOSIS — M160 Bilateral primary osteoarthritis of hip: Secondary | ICD-10-CM | POA: Diagnosis not present

## 2020-07-15 DIAGNOSIS — N200 Calculus of kidney: Secondary | ICD-10-CM | POA: Diagnosis not present

## 2020-07-15 DIAGNOSIS — R338 Other retention of urine: Secondary | ICD-10-CM

## 2020-07-15 DIAGNOSIS — N401 Enlarged prostate with lower urinary tract symptoms: Secondary | ICD-10-CM

## 2020-07-15 DIAGNOSIS — N2889 Other specified disorders of kidney and ureter: Secondary | ICD-10-CM | POA: Diagnosis not present

## 2020-07-15 DIAGNOSIS — N138 Other obstructive and reflux uropathy: Secondary | ICD-10-CM | POA: Diagnosis not present

## 2020-07-15 DIAGNOSIS — Z87442 Personal history of urinary calculi: Secondary | ICD-10-CM | POA: Diagnosis not present

## 2020-07-15 MED ORDER — TAMSULOSIN HCL 0.4 MG PO CAPS: 0.4000 mg | ORAL_CAPSULE | Freq: Every day | ORAL | 3 refills | Status: DC

## 2020-07-15 NOTE — Progress Notes (Signed)
Order for KUB is in

## 2020-07-15 NOTE — Progress Notes (Signed)
Simple Catheter Placement  Due to urinary retention patient is present today for a foley cath placement.  Patient was cleaned and prepped in a sterile fashion with betadine. A 18 coude foley catheter was inserted, urine return was noted  745ml, urine was yellow in color.  The balloon was filled with 10cc of sterile water.  A leg bag was attached for drainage. Patient was also given a night bag to take home and was given instruction on how to change from one bag to another.  Patient was given instruction on proper catheter care.  Patient tolerated well, no complications were noted   Performed by: Elberta Leatherwood, CMA, [name], [name]  Additional notes/ Follow up: 1 week TOV

## 2020-07-15 NOTE — Progress Notes (Signed)
07/15/2020 9:41 PM   [name] 03-Nov-1941 [id number]  Referring provider: [name], MD [address] [place],   [address]  Chief Complaint  Patient presents with  . Nephrolithiasis    HPI: Patient is a 79 year old male with recurrent kidney stones, BPH with LUTS, elevated PSA and urge incontinence who presents today for an urgent appointment for stone pain.   Patient called the office today stating he was experiencing pain from a kidney stone and requested to be seen urgently.  When he presented to the office, he was asked to give a urine specimen.  He stated he would have to be catheterized as he was not able to urinate.  Upon further questioning, he states he is gone 1 week without being able to urinate.  In regards to the kidney stone pain, he stated it started last week in the lower back.  Patient denies any modifying or aggravating factors.  Patient denies any gross hematuria, dysuria or suprapubic/flank pain.  Patient denies any fevers, chills, nausea or vomiting.   He was found to be in urinary retention and an 87 [place] coud catheter was placed to decompress the bladder.   His KUB today with stool and gas overlaying the kidneys and making it difficult to identify calcifications.  He does appear to have bilateral renal calcifications, but did not appreciate any calcifications in the area of the ureters.  Recurrent kidney stones He is s/p left URS/LL/ureteral stent placement with stent removal in 10/2014, right ESWL in 12/2014.  Metabolic work up indicated a low volume of urine at 1.47 L and low urinary citrate. Urinary pH 5.56.  His 24 hour urine magnesium was borderline low.  No other obvious metabolic derangements.   KUB 02/21/2020 Cluster of calculi seen in the left hemiabdomen, likely in the left kidney approximately 7, largest in the 5-6 mm range.  Renal calculi on the right is not as well seen.  Potential mild increased loss of height at L1 compared to  prior imaging evaluations.  He denies any flank pain, passage of fragments or gross hematuria.  BPH WITH LUTS His previous I PSS score was 3/0.  His previous PVR was 98 mL.  Patient denies any modifying or aggravating factors.  Patient denies any gross hematuria, dysuria or suprapubic/flank pain.  Patient denies any fevers, chills, nausea or vomiting.   Elevated PSA Patient has had a history of s/p negative biopsy x 3. Awaiting records after numerous requests from patient and [name] office.  Prostate MRI in 12/2018 revealed no radiographic evidence of high-grade prostate carcinoma. PI-RADS 1: Very Low (clinically significant cancer is highly unlikely to be present).  PSAD  0.131  04/2009   15.34 06/2013   13.04 11/2014   15.00 02/2015    13.3 Component      Prostate Specific Ag, Serum  Latest Ref Rng & Units      0.0 - 4.0 ng/mL  07/30/2015      15.1 (H)  07/22/2016      13.8 (H)  07/28/2016      15.2 (H)  03/01/2017      16.6 (H)  03/08/2017      17.5 (H)  06/09/2017     1:57 PM 14.9 (H)  06/09/2017     1:57 PM 14.8 (H)  09/01/2017      16.5 (H)  03/06/2018      15.7 (H)  08/30/2018      21.2 (H)  07/20/2019  15.0 (H)  01/22/2020      23.5 (H)  02/21/2020      17.0 (H)   PMH: Past Medical History:  Diagnosis Date  . Actinic keratosis   . BPH with urinary obstruction    sees urology Dr. [name]  . Carotid stenosis 07/20/2016   Minimal on [place] 07/2016. Monitor clinically.  . Depression with anxiety   . Elevated PSA 2014   sees urology - normal biopsies x 3  . HLD (hyperlipidemia)   . Left kidney mass 09/2014   on CT scan thought consistent with simple cyst on prior US  . Nasal obstruction    congenital R sided  . Obstructive pyelonephritis 09/2014   s/p hospitalization and treatment with levaquin  . OSA (obstructive sleep apnea)    pt denies  . Osteoporosis 06/04/2018   DEXA T score -37 at L spine  . Recurrent kidney stones 2014   extensive bilateral  nephrolithiasis, sees urology [name]), hydrocodone prn  . Squamous cell carcinoma of skin 07/25/2019   crown scalp  . Squamous cell carcinoma of skin 06/29/2017   SCC IS ant to crown post  . Symptomatic PVCs    per prior cardiologist, on beta blocker  . Urge incontinence    thought overactive bladder    Surgical History: Past Surgical History:  Procedure Laterality Date  . CARDIAC CATHETERIZATION  03/2012   EF 45-50%, no LVH, + impaired LV relaxation, no PH, no valve abnormalities  . CARDIOVASCULAR STRESS TEST  03/2012   mild peri infarct ischemia in apical segment, decreased EF  . CATARACT EXTRACTION Right 01/2013  . COLONOSCOPY  02/2008   mod diverticulosis o/w WNL (2009)  . CYSTOSCOPY    . CYSTOSCOPY  10/2014   s/p stone removal, with stent removal [name])  . TONSILECTOMY, ADENOIDECTOMY, BILATERAL MYRINGOTOMY AND TUBES     removed as a child  . US ECHOCARDIOGRAPHY  03/2012   hypokinetic anterior wall, EF 45-50%, impaired relaxation pattern, mild LA dilation, mild-mod MR, mild-mod AR    Home Medications:  Allergies as of 07/15/2020   No Known Allergies     Medication List       Accurate as of July 15, 2020  9:41 PM. If you have any questions, ask your nurse or doctor.        fluconazole 200 MG tablet Commonly known as: DIFLUCAN Take 1 tablet (200 mg total) by mouth once a week. Take 1 pill a week   metoprolol succinate 25 MG 24 hr tablet Commonly known as: TOPROL-XL TAKE 1/2 TABLET EVERY DAY   tamsulosin 0.4 MG Caps capsule Commonly known as: FLOMAX Take 1 capsule (0.4 mg total) by mouth daily. Started by: [name], [name]       Allergies: No Known Allergies  Family History: Family History  Problem Relation Age of Onset  . Urolithiasis Daughter   . Kidney Stones Mother   . Diabetes Neg Hx   . Cancer Neg Hx   . CAD Neg Hx   . Stroke Neg Hx   . Hypertension Neg Hx   . Prostate cancer Neg Hx   . Kidney cancer Neg Hx   . Bladder Cancer Neg Hx      Social History:  reports that he has never smoked. He has never used smokeless tobacco. He reports that he does not drink alcohol and does not use drugs.  ROS: For pertinent review of systems please refer to history of present illness  Physical Exam: BP [name])  151/84   Pulse 74   Ht 5\' 7"  (1.702 m)   Wt 153 lb (69.4 kg)   BMI 23.96 kg/m   Constitutional:  Well nourished. Alert and oriented, No acute distress. HEENT: [place] AT, mask in place.  Trachea midline Cardiovascular: No clubbing, cyanosis, or edema. Respiratory: Normal respiratory effort, no increased work of breathing. GI: Abdomen is soft, non tender, non distended, no abdominal masses. Liver and spleen not palpable.  No hernias appreciated.  Stool sample for occult testing is not indicated.  After Foley was placed.   GU: No CVA tenderness.  No bladder fullness or masses.  Patient with uncircumcised phallus.  Foreskin easily retracted - was pulled down over the glans after Foley was inserted.  Urethral meatus is patent.  No penile discharge. No penile lesions or rashes. Scrotum without lesions, cysts, rashes and/or edema.   Neurologic: Grossly intact, no focal deficits, moving all 4 extremities. Psychiatric: Normal mood and affect.  Laboratory Data: PSA up in HPI  Lab Results  Component Value Date   CREATININE 1.24 08/14/2019       Component Value Date/Time   CHOL 195 08/14/2019 1555   HDL 46.80 08/14/2019 1555   CHOLHDL 4 08/14/2019 1555   VLDL 20.8 08/14/2019 1555   LDLCALC 127 (H) 08/14/2019 1555    Lab Results  Component Value Date   AST 19 08/14/2019   Lab Results  Component Value Date   ALT 11 08/14/2019   I have reviewed labs  Pertinent imaging CLINICAL DATA:  Flank pain, history of bilateral nephrolithiasis  EXAM: ABDOMEN - 1 VIEW  COMPARISON:  Radiograph 02/21/2019, CT 01/28/2014  FINDINGS: There are coarse calcifications projecting over the left renal shadow. Few additional punctate  radiodensities project over the right renal shadow as well. No convincingly demonstrated calcifications along the course of either ureter however evaluation is limited by extensive overlying abdominal bowel gas, bowel contents, and vascular calcium in the abdomen and pelvis.  Few air-filled loops of small bowel in the mid abdomen are noted but do not appear frankly distended. Is a moderate colonic stool burden and some fairly prominent air-filled loops of colon seen in the upper abdomen. No high-grade obstructive bowel gas pattern is seen however. Degenerative changes in the spine hips and pelvis. Remote appearing compression deformities of the thoracolumbar spine are similar to comparison is. Additional degenerative changes in the SI joints, hips and pelvis  IMPRESSION: 1. Bilateral nephrolithiasis, left greater than right. 2. No convincingly demonstrated calcifications along the course of either ureter however evaluation is limited by overlying bowel gas, bowel contents, and vascular calcium in the abdomen and pelvis. 3. Moderate colonic stool burden and air-filled loops of colon, correlate for constipation. 4. No high-grade obstructive bowel gas pattern. 5. Stable remote thoracolumbar compression deformities.   Electronically Signed   By: Lovena Le M.D.   On: 07/15/2020 21:36 I have independently reviewed the films.  See HPI.   Assessment & Plan:    1. BPH with urinary retention Foley catheter in place Start tamsulosin 0.4 mg daily RTC in one week for TOV  2. Recurrent nephrolithiasis KUB demonstrates bilateral nephrolithiasis, L>R Patient return in 1 week for follow-up KUB Advised to contact our office or seek treatment in the ED if becomes febrile or pain/ vomiting are difficult control in order to arrange for emergent/urgent intervention  3. Elevated PSA Negative biopsy x 3 Negative prostate MRI in 12/2018 He will RTC in 6 months for PSA and exam -  08/2020  4. Recurrent nephrolithiasis KUB demonstrates a cluster of left renal stones which are likely stable Advised to contact our office or seek treatment in the ED if becomes febrile or pain/ vomiting are difficult control in order to arrange for emergent/urgent intervention   Return in about 1 week (around 07/22/2020) for KUB and TOV.  These notes generated with voice recognition software. I apologize for typographical errors.  [name], [name]  [facility] [address] [place], [place] [address] [phone]

## 2020-07-15 NOTE — Telephone Encounter (Signed)
Would you call [name] and let him know that I have sent in a prescription for tamsulosin 0.4 mg daily to the [facility] in [place] and would like him to get started on that medication?  It will hopefully help him pass his voiding trial next week and if he does have a ureteral stone it may facilitate passage.

## 2020-07-15 NOTE — Telephone Encounter (Signed)
Patient notified and voiced understanding.

## 2020-07-17 LAB — URINALYSIS, COMPLETE
Bilirubin, UA: NEGATIVE
Glucose, UA: NEGATIVE
Ketones, UA: NEGATIVE
Leukocytes,UA: NEGATIVE
Nitrite, UA: NEGATIVE
Protein,UA: NEGATIVE
Specific Gravity, UA: 1.01 (ref 1.005–1.030)
Urobilinogen, Ur: 0.2 mg/dL (ref 0.2–1.0)
pH, UA: 6.5 (ref 5.0–7.5)

## 2020-07-17 LAB — MICROSCOPIC EXAMINATION: Bacteria, UA: NONE SEEN

## 2020-07-21 NOTE — Progress Notes (Signed)
Catheter Removal  Patient is present today for a catheter removal.  8 ml of water was drained from the balloon. A 18 FR coude foley cath was removed from the bladder no complications were noted . Patient tolerated well.  Performed by: Nori Riis, [name]  Follow up/ Additional notes: He will return this afternoon for a PVR 248 mL.  He will continue the tamsulosin 0.4 mg we will add finasteride 5 mg daily.  He states that he has had episodes of gross hematuria as well.  He will return in 2 weeks for repeat PVR and recheck on gross hematuria.

## 2020-07-22 ENCOUNTER — Encounter: Payer: Self-pay | Admitting: Urology

## 2020-07-22 ENCOUNTER — Ambulatory Visit: Payer: Medicare HMO | Admitting: Urology

## 2020-07-22 ENCOUNTER — Other Ambulatory Visit: Payer: Self-pay

## 2020-07-22 DIAGNOSIS — R338 Other retention of urine: Secondary | ICD-10-CM | POA: Diagnosis not present

## 2020-07-22 LAB — BLADDER SCAN AMB NON-IMAGING

## 2020-07-22 MED ORDER — FINASTERIDE 5 MG PO TABS: 5.0000 mg | ORAL_TABLET | Freq: Every day | ORAL | 3 refills | Status: DC

## 2020-07-23 ENCOUNTER — Other Ambulatory Visit: Payer: Self-pay

## 2020-07-23 ENCOUNTER — Emergency Department
Admission: EM | Admit: 2020-07-23 | Discharge: 2020-07-23 | Disposition: A | Discharge status: Home / Self Care | Payer: Medicare HMO | Attending: Emergency Medicine | Admitting: Emergency Medicine

## 2020-07-23 ENCOUNTER — Encounter: Payer: Self-pay | Admitting: Emergency Medicine

## 2020-07-23 ENCOUNTER — Emergency Department: Payer: Medicare HMO

## 2020-07-23 DIAGNOSIS — R0902 Hypoxemia: Secondary | ICD-10-CM | POA: Diagnosis not present

## 2020-07-23 DIAGNOSIS — K449 Diaphragmatic hernia without obstruction or gangrene: Secondary | ICD-10-CM | POA: Diagnosis not present

## 2020-07-23 DIAGNOSIS — Z951 Presence of aortocoronary bypass graft: Secondary | ICD-10-CM | POA: Diagnosis not present

## 2020-07-23 DIAGNOSIS — R1030 Lower abdominal pain, unspecified: Secondary | ICD-10-CM | POA: Diagnosis present

## 2020-07-23 DIAGNOSIS — N201 Calculus of ureter: Secondary | ICD-10-CM | POA: Diagnosis not present

## 2020-07-23 DIAGNOSIS — R339 Retention of urine, unspecified: Secondary | ICD-10-CM | POA: Diagnosis not present

## 2020-07-23 DIAGNOSIS — N281 Cyst of kidney, acquired: Secondary | ICD-10-CM | POA: Diagnosis not present

## 2020-07-23 DIAGNOSIS — R319 Hematuria, unspecified: Secondary | ICD-10-CM | POA: Diagnosis not present

## 2020-07-23 DIAGNOSIS — N390 Urinary tract infection, site not specified: Secondary | ICD-10-CM | POA: Diagnosis not present

## 2020-07-23 DIAGNOSIS — N200 Calculus of kidney: Secondary | ICD-10-CM | POA: Diagnosis not present

## 2020-07-23 DIAGNOSIS — Z79899 Other long term (current) drug therapy: Secondary | ICD-10-CM | POA: Insufficient documentation

## 2020-07-23 DIAGNOSIS — I10 Essential (primary) hypertension: Secondary | ICD-10-CM | POA: Insufficient documentation

## 2020-07-23 DIAGNOSIS — N202 Calculus of kidney with calculus of ureter: Secondary | ICD-10-CM | POA: Insufficient documentation

## 2020-07-23 DIAGNOSIS — R000 Tachycardia, unspecified: Secondary | ICD-10-CM | POA: Diagnosis not present

## 2020-07-23 DIAGNOSIS — R52 Pain, unspecified: Secondary | ICD-10-CM | POA: Diagnosis not present

## 2020-07-23 LAB — BASIC METABOLIC PANEL
Anion gap: 12 (ref 5–15)
BUN: 27 mg/dL — ABNORMAL HIGH (ref 8–23)
CO2: 20 mmol/L — ABNORMAL LOW (ref 22–32)
Calcium: 9 mg/dL (ref 8.9–10.3)
Chloride: 108 mmol/L (ref 98–111)
Creatinine, Ser: 1.33 mg/dL — ABNORMAL HIGH (ref 0.61–1.24)
GFR calc Af Amer: 58 mL/min — ABNORMAL LOW (ref >60)
GFR calc non Af Amer: 50 mL/min — ABNORMAL LOW (ref >60)
Glucose, Bld: 105 mg/dL — ABNORMAL HIGH (ref 70–99)
Potassium: 4.3 mmol/L (ref 3.5–5.1)
Sodium: 140 mmol/L (ref 135–145)

## 2020-07-23 LAB — URINALYSIS, COMPLETE (UACMP) WITH MICROSCOPIC
Bilirubin Urine: NEGATIVE
Glucose, UA: NEGATIVE mg/dL
Ketones, ur: NEGATIVE mg/dL
Nitrite: NEGATIVE
Protein, ur: 100 mg/dL — AB
RBC / HPF: >50 RBC/hpf — ABNORMAL HIGH (ref 0–5)
Specific Gravity, Urine: 1.018 (ref 1.005–1.030)
Squamous Epithelial / HPF: NONE SEEN (ref 0–5)
pH: 6 (ref 5.0–8.0)

## 2020-07-23 LAB — CBC
HCT: 44.2 % (ref 39.0–52.0)
Hemoglobin: 14.5 g/dL (ref 13.0–17.0)
MCH: 29.3 pg (ref 26.0–34.0)
MCHC: 32.8 g/dL (ref 30.0–36.0)
MCV: 89.3 fL (ref 80.0–100.0)
Platelets: 212 10*3/uL (ref 150–400)
RBC: 4.95 MIL/uL (ref 4.22–5.81)
RDW: 13.7 % (ref 11.5–15.5)
WBC: 11.6 10*3/uL — ABNORMAL HIGH (ref 4.0–10.5)
nRBC: 0 % (ref 0.0–0.2)

## 2020-07-23 MED ORDER — SODIUM CHLORIDE 0.9 % IV SOLN
2.0000 g | Freq: Once | INTRAVENOUS | Status: AC
  Administered 2020-07-23: 2 g via INTRAVENOUS
  Filled 2020-07-23: qty 20

## 2020-07-23 MED ORDER — CEPHALEXIN 500 MG PO CAPS: 500.0000 mg | ORAL_CAPSULE | Freq: Four times a day (QID) | ORAL | 0 refills | Status: AC

## 2020-07-23 MED ORDER — SODIUM CHLORIDE 0.9 % IV BOLUS
1000.0000 mL | Freq: Once | INTRAVENOUS | Status: AC
  Administered 2020-07-23: 1000 mL via INTRAVENOUS

## 2020-07-23 NOTE — ED Triage Notes (Addendum)
First nurse note- urinating blood since urinary stent placed last week.  Increased pain. No fever. PIV by [facility], VSS. 100 mcg fentanyl by EMS

## 2020-07-23 NOTE — ED Provider Notes (Signed)
Dr. [name] with urology evaluated the patient. At this time feels outpatient management is appropriate. Will discharge with antibiotics.   Nance Pear, MD 07/23/20 Jeri Lager

## 2020-07-23 NOTE — Consult Note (Signed)
Urology Consult  I have been asked to see the patient by Dr. Myrene Buddy, for evaluation and management of urinary retention/distal ureteral calculi/possible UTI.  Chief Complaint: Inability to urinate  History of Present Illness: [name] is a 79 y.o. year old male well-known to the urological service with a history of BPH and urinary retention.  He underwent a voiding trial yesterday but unfortunately was not able to void overnight.  Ultimately, he presented to the emergency room this morning was found to have 887 mL in his bladder.  A Foley catheter was placed.  He also complained of some low back pain and as result, underwent a noncontrast CT scan.  He was found to have 2 left distal ureteral calculi, largest measuring 5 mm without ureteral obstruction.  WBC 11.6, UA suspicious for infection RBCs, WBCs and bacteria although recently had a Foley catheter just yesterday.  Cultures pending.  He denies any fevers or chills.  Is overall feeling better now the catheters placed.  Creatinine is at baseline, 1.33.  Vital stable.  Normotensive.  Afebrile.  Past Medical History:  Diagnosis Date  . Actinic keratosis   . BPH with urinary obstruction    sees urology Dr. [name]  . Carotid stenosis 07/20/2016   Minimal on [place] 07/2016. Monitor clinically.  . Depression with anxiety   . Elevated PSA 2014   sees urology - normal biopsies x 3  . HLD (hyperlipidemia)   . Left kidney mass 09/2014   on CT scan thought consistent with simple cyst on prior US  . Nasal obstruction    congenital R sided  . Obstructive pyelonephritis 09/2014   s/p hospitalization and treatment with levaquin  . OSA (obstructive sleep apnea)    pt denies  . Osteoporosis 06/04/2018   DEXA T score -37 at L spine  . Recurrent kidney stones 2014   extensive bilateral nephrolithiasis, sees urology [name]), hydrocodone prn  . Squamous cell carcinoma of skin 07/25/2019   crown scalp  . Squamous cell carcinoma of skin  06/29/2017   SCC IS ant to crown post  . Symptomatic PVCs    per prior cardiologist, on beta blocker  . Urge incontinence    thought overactive bladder    Past Surgical History:  Procedure Laterality Date  . CARDIAC CATHETERIZATION  03/2012   EF 45-50%, no LVH, + impaired LV relaxation, no PH, no valve abnormalities  . CARDIOVASCULAR STRESS TEST  03/2012   mild peri infarct ischemia in apical segment, decreased EF  . CATARACT EXTRACTION Right 01/2013  . COLONOSCOPY  02/2008   mod diverticulosis o/w WNL (2009)  . CYSTOSCOPY    . CYSTOSCOPY  10/2014   s/p stone removal, with stent removal [name])  . TONSILECTOMY, ADENOIDECTOMY, BILATERAL MYRINGOTOMY AND TUBES     removed as a child  . US ECHOCARDIOGRAPHY  03/2012   hypokinetic anterior wall, EF 45-50%, impaired relaxation pattern, mild LA dilation, mild-mod MR, mild-mod AR    Home Medications:  Current Outpatient Medications  Medication Instructions  . finasteride (PROSCAR) 5 mg, Oral, Daily  . fluconazole (DIFLUCAN) 200 mg, Oral, Weekly, Take 1 pill a week  . metoprolol succinate (TOPROL-XL) 25 MG 24 hr tablet TAKE 1/2 TABLET EVERY DAY  . tamsulosin (FLOMAX) 0.4 mg, Oral, Daily    Allergies: No Known Allergies  Family History  Problem Relation Age of Onset  . Urolithiasis Daughter   . Kidney Stones Mother   . Diabetes Neg Hx   . Cancer  Neg Hx   . CAD Neg Hx   . Stroke Neg Hx   . Hypertension Neg Hx   . Prostate cancer Neg Hx   . Kidney cancer Neg Hx   . Bladder Cancer Neg Hx     Social History:  reports that he has never smoked. He has never used smokeless tobacco. He reports that he does not drink alcohol and does not use drugs.  ROS: A complete review of systems was performed.  All systems are negative except for pertinent findings as noted.  Physical Exam:  Vital signs in last 24 hours: Temp:  [97.8 F (36.6 C)-97.9 F (36.6 C)] 97.9 F (36.6 C) (08/11 1140) Pulse Rate:  [65-93] 68 (08/11 1600) Resp:   [18-20] 18 (08/11 1140) BP: (131-162)/(62-91) 162/69 (08/11 1600) SpO2:  [96 %-98 %] 97 % (08/11 1600) Constitutional:  Alert and oriented, No acute distress HEENT: [place] AT, moist mucus membranes.  Trachea midline, no masses Cardiovascular: Regular rate and rhythm, no clubbing, cyanosis, or edema. Respiratory: Normal respiratory effort, lungs clear bilaterally Skin: No rashes, bruises or suspicious lesions GU: Foley catheter in place draining clear yellow urine Neurologic: Grossly intact, no focal deficits, moving all 4 extremities Psychiatric: Normal mood and affect   Laboratory Data:  Recent Labs    07/23/20 0713  WBC 11.6*  HGB 14.5  HCT 44.2   Recent Labs    07/23/20 0713  NA 140  K 4.3  CL 108  CO2 20*  GLUCOSE 105*  BUN 27*  CREATININE 1.33*  CALCIUM 9.0    Ref Range & Units 08:12  Color, Urine YELLOW AMBERAbnormal   Comment: BIOCHEMICALS MAY BE AFFECTED BY COLOR  APPearance CLEAR CLOUDYAbnormal   Specific Gravity, Urine 1.005 - 1.030 1.018   pH 5.0 - 8.0 6.0   Glucose, UA NEGATIVE mg/dL NEGATIVE   Hgb urine dipstick NEGATIVE LARGEAbnormal   Bilirubin Urine NEGATIVE NEGATIVE   Ketones, ur NEGATIVE mg/dL NEGATIVE   Protein, ur NEGATIVE mg/dL 100Abnormal   Nitrite NEGATIVE NEGATIVE   Leukocytes,Ua NEGATIVE TRACEAbnormal   RBC / HPF 0 - 5 RBC/hpf >50High   WBC, UA 0 - 5 WBC/hpf 21-50   Bacteria, UA NONE SEEN MANYAbnormal   Squamous Epithelial / LPF 0 - 5 NONE SEEN   Mucus  PRESENT          Radiologic Imaging: CT Renal Stone Study  Result Date: 07/23/2020 CLINICAL DATA:  Bilateral flank pain and hematuria EXAM: CT ABDOMEN AND PELVIS WITHOUT CONTRAST TECHNIQUE: Multidetector CT imaging of the abdomen and pelvis was performed following the standard protocol without IV contrast. COMPARISON:  2015 FINDINGS: Lower chest: No acute abnormality. Hepatobiliary: Too small to characterize hypoattenuating lesion of the liver. Layering gallbladder sludge  and possible small gallstones. No biliary dilatation. Pancreas: Unremarkable. Spleen: Unremarkable. Adrenals/Urinary Tract: Adrenals are unremarkable. Exophytic left renal cyst. Multiple nonobstructing renal calculi bilaterally. Largest measures 5 mm. There are 2 calculi within the distal left ureter just above the ureterovesical junction. The more proximal measures 2 mm and the more distal measures 5 mm. No significant hydroureteronephrosis. Bladder is decompressed by Foley catheter. Stomach/Bowel: Moderate hiatal hernia. Bowel is normal in caliber. There is sigmoid diverticulosis. Vascular/Lymphatic: Diffuse aortic atherosclerosis. There are no enlarged lymph nodes identified. Reproductive: Similar appearance of marked prostate enlargement. Other: No ascites.  No acute abnormality of the abdominal wall. Musculoskeletal: There are chronic appearing compression deformities T11, T12, L1, and L4. IMPRESSION: Two calculi within the distal left ureter just above the  ureterovesical junction. Larger measures 5 mm. No significant proximal hydroureteronephrosis. Multiple nonobstructing bilateral renal calculi measuring up to 5 mm. Additional chronic findings detailed above. Electronically Signed   By: Macy Mis M.D.   On: 07/23/2020 15:03    CT scan was personally reviewed.  Agree with radiologic interpretation.  Impression/ Plan:   1.  BPH with bladder outlet obstruction/urinary retention- failed voiding trial, Foley replaced by the ER today.  Unclear why he came to the emergency room rather than coming to our office for follow-up.  He does have 130 g prostate with a large median lobe currently on maximal medical therapy.  He will likely require an outlet procedure in the form of HoLEP based on gland size which she can discuss further as an outpatient.  Please discharge from ER with Foley catheter.  Continue Flomax and finasteride.    2.  Left distal ureteral calculi-seems to be incidental.  He does not have  lateralizing pain to his left.  There is no hydronephrosis or ureteral obstruction.  His creatinine is essentially at baseline.  He does possibly have cystitis although this may be urine consistent with recent Foley catheter.  No signs or symptoms of sepsis thus I feel that he is appropriate for discharge without intervention.  We will plan to follow-up on the stones and if he fails to pass them spontaneously, consider ureteroscopy versus shockwave.  Warning symptoms reviewed extensively in detail including indications for return to ER with fevers, severe lateralizing flank pain, or any other signs or symptoms of sepsis.  3.  UTI-urine suspicious.  In the setting of ureteral calculi, Foley catheter and other comorbidities, I recommend treating him for presumed infection x 7 days.  Please send urine culture.  Okay for discharge from urologic perspective with close outpatient follow-up.  Patient is agreeable this plan.  I communicated my recommendations to Dr. Ellender Hose.  07/23/2020, 5:28 PM  Hollice Espy,  MD     \

## 2020-07-23 NOTE — ED Provider Notes (Signed)
[facility] Emergency Department Provider Note  ____________________________________________   First MD Initiated Contact with Patient 07/23/20 1244     (approximate)  I have reviewed the triage vital signs and the nursing notes.   HISTORY  Chief Complaint Abdominal Pain and Urinary Retention    HPI [name] is a 79 y.o. male  Here with lower abd pain. Pt has a known h/o BPH, chronic urinary obstruction as well as recurrent nephrolithiasis. He was just seen by Urology in clinic for retention, and had his foley removed. Since then, he has had little to no urine output and has subsequently developed progressively worsening lower abdominal pain and fullness. Pain is aching, cramping. He has had some associated hematuria, passage of small clots. Over the last several hours, he has also developed b/l flank pain. No nausea, vomiting. No known fevers.        Past Medical History:  Diagnosis Date  . Actinic keratosis   . BPH with urinary obstruction    sees urology Dr. [name]  . Carotid stenosis 07/20/2016   Minimal on [place] 07/2016. Monitor clinically.  . Depression with anxiety   . Elevated PSA 2014   sees urology - normal biopsies x 3  . HLD (hyperlipidemia)   . Left kidney mass 09/2014   on CT scan thought consistent with simple cyst on prior US  . Nasal obstruction    congenital R sided  . Obstructive pyelonephritis 09/2014   s/p hospitalization and treatment with levaquin  . OSA (obstructive sleep apnea)    pt denies  . Osteoporosis 06/04/2018   DEXA T score -37 at L spine  . Recurrent kidney stones 2014   extensive bilateral nephrolithiasis, sees urology [name]), hydrocodone prn  . Squamous cell carcinoma of skin 07/25/2019   crown scalp  . Squamous cell carcinoma of skin 06/29/2017   SCC IS ant to crown post  . Symptomatic PVCs    per prior cardiologist, on beta blocker  . Urge incontinence    thought overactive bladder    Patient  Active Problem List   Diagnosis Date Noted  . Anemia 09/06/2018  . Hypertension 09/06/2018  . HLD (hyperlipidemia) 08/11/2018  . Hyperparathyroidism ([place]) 06/21/2018  . Osteoporosis 06/04/2018  . History of vertebral compression fracture 05/27/2018  . Lumbar scoliosis 05/27/2018  . Aorto-iliac atherosclerosis ([place]) 05/27/2018  . Chronic nasal congestion 05/27/2018  . Lumbar spondylosis 04/05/2018  . Health maintenance examination 07/20/2016  . Advanced care planning/counseling discussion 07/20/2016  . Carotid stenosis 07/20/2016  . Renal insufficiency 07/20/2016  . Left kidney mass 09/12/2014  . Medicare annual wellness visit, subsequent 02/19/2014  . Hyperglycemia 02/19/2014  . BPH with urinary obstruction   . Recurrent kidney stones   . Elevated PSA   . Depression with anxiety   . Symptomatic PVCs   . Dyspnea on exertion 03/30/2012  . Left ventricular dysfunction 03/30/2012  . Abnormal EKG 03/16/2012  . Fatigue 03/06/2012  . Anxiety 01/18/2012  . History of kidney stones 12/28/2011    Past Surgical History:  Procedure Laterality Date  . CARDIAC CATHETERIZATION  03/2012   EF 45-50%, no LVH, + impaired LV relaxation, no PH, no valve abnormalities  . CARDIOVASCULAR STRESS TEST  03/2012   mild peri infarct ischemia in apical segment, decreased EF  . CATARACT EXTRACTION Right 01/2013  . COLONOSCOPY  02/2008   mod diverticulosis o/w WNL (2009)  . CYSTOSCOPY    . CYSTOSCOPY  10/2014   s/p stone removal, with  stent removal [name])  . TONSILECTOMY, ADENOIDECTOMY, BILATERAL MYRINGOTOMY AND TUBES     removed as a child  . US ECHOCARDIOGRAPHY  03/2012   hypokinetic anterior wall, EF 45-50%, impaired relaxation pattern, mild LA dilation, mild-mod MR, mild-mod AR    Prior to Admission medications   Medication Sig Start Date End Date Taking? Authorizing Provider  finasteride (PROSCAR) 5 MG tablet Take 1 tablet (5 mg total) by mouth daily. 07/22/20   [name] A, [name]    fluconazole (DIFLUCAN) 200 MG tablet Take 1 tablet (200 mg total) by mouth once a week. Take 1 pill a week 03/26/20   Ralene Bathe, MD  metoprolol succinate (TOPROL-XL) 25 MG 24 hr tablet TAKE 1/2 TABLET EVERY DAY 10/12/19   [name], MD  tamsulosin (FLOMAX) 0.4 MG CAPS capsule Take 1 capsule (0.4 mg total) by mouth daily. 07/15/20   [name] A, [name]    Allergies Patient has no known allergies.  Family History  Problem Relation Age of Onset  . Urolithiasis Daughter   . Kidney Stones Mother   . Diabetes Neg Hx   . Cancer Neg Hx   . CAD Neg Hx   . Stroke Neg Hx   . Hypertension Neg Hx   . Prostate cancer Neg Hx   . Kidney cancer Neg Hx   . Bladder Cancer Neg Hx     Social History Social History   Tobacco Use  . Smoking status: Never Smoker  . Smokeless tobacco: Never Used  Vaping Use  . Vaping Use: Never used  Substance Use Topics  . Alcohol use: No  . Drug use: No    Review of Systems  Review of Systems  Constitutional: Negative for chills and fever.  HENT: Negative for sore throat.   Respiratory: Negative for shortness of breath.   Cardiovascular: Negative for chest pain.  Gastrointestinal: Negative for abdominal pain.  Genitourinary: Positive for difficulty urinating and flank pain.  Musculoskeletal: Negative for neck pain.  Skin: Negative for rash and wound.  Allergic/Immunologic: Negative for immunocompromised state.  Neurological: Negative for weakness and numbness.  Hematological: Does not bruise/bleed easily.  All other systems reviewed and are negative.    ____________________________________________  PHYSICAL EXAM:      VITAL SIGNS: ED Triage Vitals  Enc Vitals Group     BP 07/23/20 0708 (!) 150/91     Pulse Rate 07/23/20 0708 93     Resp 07/23/20 0708 20     Temp 07/23/20 0708 97.8 F (36.6 C)     Temp Source 07/23/20 0708 Oral     SpO2 07/23/20 0708 97 %     Weight --      Height --      Head Circumference --      Peak  Flow --      Pain Score 07/23/20 0719 10     Pain Loc --      Pain Edu? --      Excl. in [place]? --      Physical Exam Vitals and nursing note reviewed.  Constitutional:      General: He is not in acute distress.    Appearance: He is well-developed.  HENT:     Head: Normocephalic and atraumatic.  Eyes:     Conjunctiva/sclera: Conjunctivae normal.  Cardiovascular:     Rate and Rhythm: Normal rate and regular rhythm.     Heart sounds: Normal heart sounds. No murmur heard.  No friction rub.  Pulmonary:  Effort: Pulmonary effort is normal. No respiratory distress.     Breath sounds: Normal breath sounds. No wheezing or rales.  Abdominal:     General: There is no distension.     Palpations: Abdomen is soft.     Tenderness: There is abdominal tenderness in the suprapubic area. There is no guarding or rebound.  Musculoskeletal:     Cervical back: Neck supple.  Skin:    General: Skin is warm.     Capillary Refill: Capillary refill takes less than 2 seconds.  Neurological:     Mental Status: He is alert and oriented to person, place, and time.     Motor: No abnormal muscle tone.       ____________________________________________   LABS (all labs ordered are listed, but only abnormal results are displayed)  Labs Reviewed  URINALYSIS, COMPLETE (UACMP) WITH MICROSCOPIC - Abnormal; Notable for the following components:      Result Value   Color, Urine AMBER (*)    APPearance CLOUDY (*)    Hgb urine dipstick LARGE (*)    Protein, ur 100 (*)    Leukocytes,Ua TRACE (*)    RBC / HPF >50 (*)    Bacteria, UA MANY (*)    All other components within normal limits  CBC - Abnormal; Notable for the following components:   WBC 11.6 (*)    All other components within normal limits  BASIC METABOLIC PANEL - Abnormal; Notable for the following components:   CO2 20 (*)    Glucose, Bld 105 (*)    BUN 27 (*)    Creatinine, Ser 1.33 (*)    GFR calc non Af Amer 50 (*)    GFR calc Af  Amer 58 (*)    All other components within normal limits  URINE CULTURE    ____________________________________________  EKG:  ________________________________________  RADIOLOGY All imaging, including plain films, CT scans, and ultrasounds, independently reviewed by me, and interpretations confirmed via formal radiology reads.  ED MD interpretation:   CT Stone:: Two calculi within distal left ureter, no hydro  Official radiology report(s): CT Renal Stone Study  Result Date: 07/23/2020 CLINICAL DATA:  Bilateral flank pain and hematuria EXAM: CT ABDOMEN AND PELVIS WITHOUT CONTRAST TECHNIQUE: Multidetector CT imaging of the abdomen and pelvis was performed following the standard protocol without IV contrast. COMPARISON:  2015 FINDINGS: Lower chest: No acute abnormality. Hepatobiliary: Too small to characterize hypoattenuating lesion of the liver. Layering gallbladder sludge and possible small gallstones. No biliary dilatation. Pancreas: Unremarkable. Spleen: Unremarkable. Adrenals/Urinary Tract: Adrenals are unremarkable. Exophytic left renal cyst. Multiple nonobstructing renal calculi bilaterally. Largest measures 5 mm. There are 2 calculi within the distal left ureter just above the ureterovesical junction. The more proximal measures 2 mm and the more distal measures 5 mm. No significant hydroureteronephrosis. Bladder is decompressed by Foley catheter. Stomach/Bowel: Moderate hiatal hernia. Bowel is normal in caliber. There is sigmoid diverticulosis. Vascular/Lymphatic: Diffuse aortic atherosclerosis. There are no enlarged lymph nodes identified. Reproductive: Similar appearance of marked prostate enlargement. Other: No ascites.  No acute abnormality of the abdominal wall. Musculoskeletal: There are chronic appearing compression deformities T11, T12, L1, and L4. IMPRESSION: Two calculi within the distal left ureter just above the ureterovesical junction. Larger measures 5 mm. No significant  proximal hydroureteronephrosis. Multiple nonobstructing bilateral renal calculi measuring up to 5 mm. Additional chronic findings detailed above. Electronically Signed   By: Macy Mis M.D.   On: 07/23/2020 15:03    ____________________________________________  PROCEDURES  Procedure(s) performed (including Critical Care):  Procedures  ____________________________________________  INITIAL IMPRESSION / MDM / ASSESSMENT AND PLAN / ED COURSE  As part of my medical decision making, I reviewed the following data within the [facility] notes reviewed and incorporated, Old chart reviewed, Notes from prior ED visits, and [place] Controlled Substance Database       *Juluis Fitzsimmons was evaluated in Emergency Department on 07/23/2020 for the symptoms described in the history of present illness. He was evaluated in the context of the global COVID-19 pandemic, which necessitated consideration that the patient might be at risk for infection with the SARS-CoV-2 virus that causes COVID-19. Institutional protocols and algorithms that pertain to the evaluation of patients at risk for COVID-19 are in a state of rapid change based on information released by regulatory bodies including the CDC and federal and state organizations. These policies and algorithms were followed during the patient's care in the ED.  Some ED evaluations and interventions may be delayed as a result of limited staffing during the pandemic.*     Medical Decision Making:  79 yo M here with lower abdominal pain and urinary retention. Suspect acute on chronic outlet obstruction 2/2 BPH, also likely with acute UTI. Pt afebrile but noted to have leukocytosis concerning for UTI. BMP with mildly decrased bicarb, elevated BUN:Cr ratio c/w dehydraiton. IVF, Rocephin given and UCx sent. Given his history, CT stone study obtained. Reviewed by me. This does show multiple left ureteral stones, though there is no hydro. Suspect UTI  with BPH more so than infected stone. Discussed with Dr. [name], who will have her PA come see pt in ED.   ____________________________________________  FINAL CLINICAL IMPRESSION(S) / ED DIAGNOSES  Final diagnoses:  Lower urinary tract infectious disease  Urinary retention     MEDICATIONS GIVEN DURING THIS VISIT:  Medications  cefTRIAXone (ROCEPHIN) 2 g in sodium chloride 0.9 % 100 mL IVPB (0 g Intravenous Stopped 07/23/20 1407)  sodium chloride 0.9 % bolus 1,000 mL (0 mLs Intravenous Stopped 07/23/20 1624)     ED Discharge Orders    None       Note:  This document was prepared using Dragon voice recognition software and may include unintentional dictation errors.   Duffy Bruce, MD 07/23/20 [phone]

## 2020-07-23 NOTE — ED Notes (Signed)
Bladder scan preformed on pt, pt retaining 859mL of urine. RN notified

## 2020-07-23 NOTE — Discharge Instructions (Signed)
Please seek medical attention for any high fevers, chest pain, shortness of breath, change in behavior, persistent vomiting, bloody stool or any other new or concerning symptoms.  

## 2020-07-23 NOTE — ED Triage Notes (Signed)
Pt in via [facility], reports urinary catheter removal yesterday per Urology, pt has been unable to urinate since then.  Increasing pain noted to bladder.

## 2020-07-25 ENCOUNTER — Encounter: Payer: Self-pay | Admitting: Urology

## 2020-07-25 LAB — URINE CULTURE: Culture: >=100000 — AB

## 2020-07-28 NOTE — Telephone Encounter (Signed)
Spoke with daughter regarding appointment, rescheduled for [facility] location.

## 2020-07-28 NOTE — H&P (View-Only) (Signed)
07/29/2020 10:20 AM   [name] 08/31/1941 [id number]  Referring provider: [name], MD [address] [place],  [place] [address] Chief Complaint  Patient presents with   Urinary Retention    HPI: [name] is a 79 y.o. male returns today to discuss HoLEP treatment of ureteral stones. Patient has a history of elevated PSA, urge incontinence, and BPH with LUTS. Patient ws accompanied by his daughter.   Recurrent kidney stones He is s/p left URS/LL/ureteral stent placement with stent removal in 10/2014, right ESWL in 12/2014.  Metabolic work up indicated a low volume of urine at 1.47 L and low urinary citrate. Urinary pH 5.56. His 24 hour urine magnesium was borderline low. No other obvious metabolic derangements.   KUB 02/21/2020 Cluster of calculi seen in the left hemiabdomen, likely in the left kidney approximately 7, largest in the 5-6 mm range.  Renal calculi on the right is not as well seen.  Potential mild increased loss of height at L1 compared to prior imaging evaluations.    CT renal stone study on 07/23/2020 revealed two calculi within the distal left ureter just above the ureterovesical junction. Larger measures 5 mm. No significant proximal hydroureteronephrosis. Multiple nonobstructing bilateral renal calculi measuring up to 5 mm.  He is asymptomatic from the stones.  BPH WITH LUTS Patient was recently seen in the ED on 07/23/2020 for abdominal pain and urinary retention. After foley removal he notes little to no urine output and had subsequently developed progressively worsening lower abdominal pain and fullness. Pain was aching, cramping. He had some associated hematuria, passage of small clots. Over the last several hours, he had also developed bilateral flank pain. No nausea, vomiting. No known fevers. CT renal study was performed, details above.   Patient received IV Rocephin. UA showed gross hematuria, >50 RBCs, 21-50 WBCs, and many bacteria. Urine  culture grew klebsiella pneumoniae.   Known prostamegaly with median lobe, 120 g prostate.  He has been offered outlet procedures in the past and is now interested in pursuing one in light of his urinary retention and failed voiding trials.  Elevated PSA Patient has had a history of s/p negative biopsy x 3. Awaiting records after numerous requests from patient and [name] office.  Prostate MRI in 12/2018 revealed no radiographic evidence of high-grade prostate carcinoma. PI-RADS 1: Very Low (clinically significant cancer is highly unlikely to be present).  PSAD  0.131  Most recent PSA is 17.0 as of 02/21/2020.   PSA trend: Component     Latest Ref Rng & Units 09/01/2017 03/06/2018 08/30/2018 07/20/2019             Prostate Specific Ag, Serum     0.0 - 4.0 ng/mL 16.5 (H) 15.7 (H) 21.2 (H) 15.0 (H)   Component     Latest Ref Rng & Units 01/22/2020 02/21/2020           Prostate Specific Ag, Serum     0.0 - 4.0 ng/mL 23.5 (H) 17.0 (H)     PMH: Past Medical History:  Diagnosis Date   Actinic keratosis    BPH with urinary obstruction    sees urology Dr. Erlene Brown   Carotid stenosis 07/20/2016   Minimal on [place] 07/2016. Monitor clinically.   Depression with anxiety    Elevated PSA 2014   sees urology - normal biopsies x 3   HLD (hyperlipidemia)    Left kidney mass 09/2014   on CT scan thought consistent with simple cyst  on prior US   Nasal obstruction    congenital R sided   Obstructive pyelonephritis 09/2014   s/p hospitalization and treatment with levaquin   OSA (obstructive sleep apnea)    pt denies   Osteoporosis 06/04/2018   DEXA T score -37 at L spine   Recurrent kidney stones 2014   extensive bilateral nephrolithiasis, sees urology [name]), hydrocodone prn   Squamous cell carcinoma of skin 07/25/2019   crown scalp   Squamous cell carcinoma of skin 06/29/2017   SCC IS ant to crown post   Symptomatic PVCs    per prior cardiologist, on beta blocker   Urge  incontinence    thought overactive bladder    Surgical History: Past Surgical History:  Procedure Laterality Date   CARDIAC CATHETERIZATION  03/2012   EF 45-50%, no LVH, + impaired LV relaxation, no PH, no valve abnormalities   CARDIOVASCULAR STRESS TEST  03/2012   mild peri infarct ischemia in apical segment, decreased EF   CATARACT EXTRACTION Right 01/2013   COLONOSCOPY  02/2008   mod diverticulosis o/w WNL (2009)   CYSTOSCOPY     CYSTOSCOPY  10/2014   s/p stone removal, with stent removal [name])   TONSILECTOMY, ADENOIDECTOMY, BILATERAL MYRINGOTOMY AND TUBES     removed as a child   US ECHOCARDIOGRAPHY  03/2012   hypokinetic anterior wall, EF 45-50%, impaired relaxation pattern, mild LA dilation, mild-mod MR, mild-mod AR    Home Medications:  Allergies as of 07/29/2020   No Known Allergies     Medication List       Accurate as of July 29, 2020 10:20 AM. If you have any questions, ask your nurse or doctor.        cephALEXin 500 MG capsule Commonly known as: KEFLEX Take 1 capsule (500 mg total) by mouth 4 (four) times daily for 10 days.   finasteride 5 MG tablet Commonly known as: Proscar Take 1 tablet (5 mg total) by mouth daily.   fluconazole 200 MG tablet Commonly known as: DIFLUCAN Take 1 tablet (200 mg total) by mouth once a week. Take 1 pill a week   metoprolol succinate 25 MG 24 hr tablet Commonly known as: TOPROL-XL TAKE 1/2 TABLET EVERY DAY   tamsulosin 0.4 MG Caps capsule Commonly known as: FLOMAX Take 1 capsule (0.4 mg total) by mouth daily.       Allergies: No Known Allergies  Family History: Family History  Problem Relation Age of Onset   Urolithiasis Daughter    Kidney Stones Mother    Diabetes Neg Hx    Cancer Neg Hx    CAD Neg Hx    Stroke Neg Hx    Hypertension Neg Hx    Prostate cancer Neg Hx    Kidney cancer Neg Hx    Bladder Cancer Neg Hx     Social History:  reports that he has never smoked. He has never  used smokeless tobacco. He reports that he does not drink alcohol and does not use drugs.   Physical Exam: BP (!) 153/54    Pulse 62   Constitutional:  Alert and oriented, No acute distress.  Accompanied by his daughter today.  In wheelchair. HEENT: [place] AT, moist mucus membranes.  Trachea midline, no masses. Cardiovascular: No clubbing, cyanosis, or edema. Respiratory: Normal respiratory effort, no increased work of breathing. Skin: No rashes, bruises or suspicious lesions. Neurologic: Grossly intact, no focal deficits, moving all 4 extremities. Psychiatric: Normal mood and affect.  Laboratory Data:  Lab Results  Component Value Date   CREATININE 1.33 (H) 07/23/2020    Pertinent Imaging:  CT Renal Stone Study  Narrative CLINICAL DATA:  Bilateral flank pain and hematuria  EXAM: CT ABDOMEN AND PELVIS WITHOUT CONTRAST  TECHNIQUE: Multidetector CT imaging of the abdomen and pelvis was performed following the standard protocol without IV contrast.  COMPARISON:  2015  FINDINGS: Lower chest: No acute abnormality.  Hepatobiliary: Too small to characterize hypoattenuating lesion of the liver. Layering gallbladder sludge and possible small gallstones. No biliary dilatation.  Pancreas: Unremarkable.  Spleen: Unremarkable.  Adrenals/Urinary Tract: Adrenals are unremarkable. Exophytic left renal cyst. Multiple nonobstructing renal calculi bilaterally. Largest measures 5 mm. There are 2 calculi within the distal left ureter just above the ureterovesical junction. The more proximal measures 2 mm and the more distal measures 5 mm. No significant hydroureteronephrosis. Bladder is decompressed by Foley catheter.  Stomach/Bowel: Moderate hiatal hernia. Bowel is normal in caliber. There is sigmoid diverticulosis.  Vascular/Lymphatic: Diffuse aortic atherosclerosis. There are no enlarged lymph nodes identified.  Reproductive: Similar appearance of marked prostate  enlargement.  Other: No ascites.  No acute abnormality of the abdominal wall.  Musculoskeletal: There are chronic appearing compression deformities T11, T12, L1, and L4.  IMPRESSION: Two calculi within the distal left ureter just above the ureterovesical junction. Larger measures 5 mm. No significant proximal hydroureteronephrosis.  Multiple nonobstructing bilateral renal calculi measuring up to 5 mm.  Additional chronic findings detailed above.   Electronically Signed By: [name] M.D. On: 07/23/2020 15:03  I have personally reviewed the images and agree with radiologist interpretation.    Assessment & Plan:    1. Elevated PSA Most recent PSA is 17.0 as of 02/21/2020.  Chronic, prostate MRI has been reassuring  2. Recurrent kidney stones/left ureteral stone Recommend left ureteroscopy/stent placement at the time of #2  Risks and benefits of ureteroscopy were reviewed including but not limited to infection, bleeding, pain, ureteral injury which could require open surgery versus prolonged indwelling if ureteral perforation occurs, persistent stone disease, requirement for staged procedure, possible stent, and global anesthesia risks. Patient expressed understanding and desires to proceed with ureteroscopy.   3. BPH with LUTS Refractory urinary retention, failed voiding trials in the setting of massive prostamegaly, 120 g prostate despite Flomax and finasteride.  At this point time, of highly recommended pursuing outlet procedure.  Based on the size and confirmation of his gland, most really recommended consideration of holmium laser enucleation of the prostate.  We reviewed the surgery in detail today including the preoperative, intraoperative, and postoperative course.  This will most likely be an outpatient procedure pending the degree of post op hematuria.  He will go home with catheter for a few days post op and will either be taught how to remove his own catheter or  return to the office for catheter removal. Risk of bleeding, infection, damage surrounding structures, injury to the bladder/ urethral, bladder neck contracture, ureteral stricture, retrograde ejaculation, stress/ urge incontinence, exacerbation of irritative voiding symptoms were all discussed in detail.    Will need cardiology clearance prior to procedure.    [facility] [address] [place], [place] [address] [phone]  I, Selena Batten, am acting as a scribe for Dr. Hollice Espy.  I have reviewed the above documentation for accuracy and completeness, and I agree with the above.   Hollice Espy, MD

## 2020-07-28 NOTE — Progress Notes (Signed)
07/29/2020 10:20 AM   [name] [birth year] [id number]  Referring provider: [name], MD [address] [place],  [place] [address] Chief Complaint  Patient presents with  . Urinary Retention    HPI: [name] is a 79 y.o. male returns today to discuss HoLEP treatment of ureteral stones. Patient has a history of elevated PSA, urge incontinence, and BPH with LUTS. Patient ws accompanied by his daughter.   Recurrent kidney stones He is s/p left URS/LL/ureteral stent placement with stent removal in 10/2014, right ESWL in 12/2014.  Metabolic work up indicated a low volume of urine at 1.47 L and low urinary citrate. Urinary pH 5.56. His 24 hour urine magnesium was borderline low. No other obvious metabolic derangements.   KUB 02/21/2020 Cluster of calculi seen in the left hemiabdomen, likely in the left kidney approximately 7, largest in the 5-6 mm range.  Renal calculi on the right is not as well seen.  Potential mild increased loss of height at L1 compared to prior imaging evaluations.    CT renal stone study on 07/23/2020 revealed two calculi within the distal left ureter just above the ureterovesical junction. Larger measures 5 mm. No significant proximal hydroureteronephrosis. Multiple nonobstructing bilateral renal calculi measuring up to 5 mm.  He is asymptomatic from the stones.  BPH WITH LUTS Patient was recently seen in the ED on 07/23/2020 for abdominal pain and urinary retention. After foley removal he notes little to no urine output and had subsequently developed progressively worsening lower abdominal pain and fullness. Pain was aching, cramping. He had some associated hematuria, passage of small clots. Over the last several hours, he had also developed bilateral flank pain. No nausea, vomiting. No known fevers. CT renal study was performed, details above.   Patient received IV Rocephin. UA showed gross hematuria, >50 RBCs, 21-50 WBCs, and many bacteria. Urine  culture grew klebsiella pneumoniae.   Known prostamegaly with median lobe, 120 g prostate.  He has been offered outlet procedures in the past and is now interested in pursuing one in light of his urinary retention and failed voiding trials.  Elevated PSA Patient has had a history of s/p negative biopsy x 3. Awaiting records after numerous requests from patient and [name] office.  Prostate MRI in 12/2018 revealed no radiographic evidence of high-grade prostate carcinoma. PI-RADS 1: Very Low (clinically significant cancer is highly unlikely to be present).  PSAD  0.131  Most recent PSA is 17.0 as of 02/21/2020.   PSA trend: Component     Latest Ref Rng & Units 09/01/2017 03/06/2018 08/30/2018 07/20/2019             Prostate Specific Ag, Serum     0.0 - 4.0 ng/mL 16.5 (H) 15.7 (H) 21.2 (H) 15.0 (H)   Component     Latest Ref Rng & Units 01/22/2020 02/21/2020           Prostate Specific Ag, Serum     0.0 - 4.0 ng/mL 23.5 (H) 17.0 (H)     PMH: Past Medical History:  Diagnosis Date  . Actinic keratosis   . BPH with urinary obstruction    sees urology Dr. [name]  . Carotid stenosis 07/20/2016   Minimal on [place] 07/2016. Monitor clinically.  . Depression with anxiety   . Elevated PSA 2014   sees urology - normal biopsies x 3  . HLD (hyperlipidemia)   . Left kidney mass 09/2014   on CT scan thought consistent with simple cyst  on prior US  . Nasal obstruction    congenital R sided  . Obstructive pyelonephritis 09/2014   s/p hospitalization and treatment with levaquin  . OSA (obstructive sleep apnea)    pt denies  . Osteoporosis 06/04/2018   DEXA T score -37 at L spine  . Recurrent kidney stones 2014   extensive bilateral nephrolithiasis, sees urology [name]), hydrocodone prn  . Squamous cell carcinoma of skin 07/25/2019   crown scalp  . Squamous cell carcinoma of skin 06/29/2017   SCC IS ant to crown post  . Symptomatic PVCs    per prior cardiologist, on beta blocker  . Urge  incontinence    thought overactive bladder    Surgical History: Past Surgical History:  Procedure Laterality Date  . CARDIAC CATHETERIZATION  03/2012   EF 45-50%, no LVH, + impaired LV relaxation, no PH, no valve abnormalities  . CARDIOVASCULAR STRESS TEST  03/2012   mild peri infarct ischemia in apical segment, decreased EF  . CATARACT EXTRACTION Right 01/2013  . COLONOSCOPY  02/2008   mod diverticulosis o/w WNL (2009)  . CYSTOSCOPY    . CYSTOSCOPY  10/2014   s/p stone removal, with stent removal [name])  . TONSILECTOMY, ADENOIDECTOMY, BILATERAL MYRINGOTOMY AND TUBES     removed as a child  . US ECHOCARDIOGRAPHY  03/2012   hypokinetic anterior wall, EF 45-50%, impaired relaxation pattern, mild LA dilation, mild-mod MR, mild-mod AR    Home Medications:  Allergies as of 07/29/2020   No Known Allergies     Medication List       Accurate as of July 29, 2020 10:20 AM. If you have any questions, ask your nurse or doctor.        cephALEXin 500 MG capsule Commonly known as: KEFLEX Take 1 capsule (500 mg total) by mouth 4 (four) times daily for 10 days.   finasteride 5 MG tablet Commonly known as: Proscar Take 1 tablet (5 mg total) by mouth daily.   fluconazole 200 MG tablet Commonly known as: DIFLUCAN Take 1 tablet (200 mg total) by mouth once a week. Take 1 pill a week   metoprolol succinate 25 MG 24 hr tablet Commonly known as: TOPROL-XL TAKE 1/2 TABLET EVERY DAY   tamsulosin 0.4 MG Caps capsule Commonly known as: FLOMAX Take 1 capsule (0.4 mg total) by mouth daily.       Allergies: No Known Allergies  Family History: Family History  Problem Relation Age of Onset  . Urolithiasis Daughter   . Kidney Stones Mother   . Diabetes Neg Hx   . Cancer Neg Hx   . CAD Neg Hx   . Stroke Neg Hx   . Hypertension Neg Hx   . Prostate cancer Neg Hx   . Kidney cancer Neg Hx   . Bladder Cancer Neg Hx     Social History:  reports that he has never smoked. He has never  used smokeless tobacco. He reports that he does not drink alcohol and does not use drugs.   Physical Exam: BP (!) 153/54   Pulse 62   Constitutional:  Alert and oriented, No acute distress.  Accompanied by his daughter today.  In wheelchair. HEENT: [place] AT, moist mucus membranes.  Trachea midline, no masses. Cardiovascular: No clubbing, cyanosis, or edema. Respiratory: Normal respiratory effort, no increased work of breathing. Skin: No rashes, bruises or suspicious lesions. Neurologic: Grossly intact, no focal deficits, moving all 4 extremities. Psychiatric: Normal mood and affect.  Laboratory Data:  Lab Results  Component Value Date   CREATININE 1.33 (H) 07/23/2020    Pertinent Imaging:  CT Renal Stone Study  Narrative CLINICAL DATA:  Bilateral flank pain and hematuria  EXAM: CT ABDOMEN AND PELVIS WITHOUT CONTRAST  TECHNIQUE: Multidetector CT imaging of the abdomen and pelvis was performed following the standard protocol without IV contrast.  COMPARISON:  2015  FINDINGS: Lower chest: No acute abnormality.  Hepatobiliary: Too small to characterize hypoattenuating lesion of the liver. Layering gallbladder sludge and possible small gallstones. No biliary dilatation.  Pancreas: Unremarkable.  Spleen: Unremarkable.  Adrenals/Urinary Tract: Adrenals are unremarkable. Exophytic left renal cyst. Multiple nonobstructing renal calculi bilaterally. Largest measures 5 mm. There are 2 calculi within the distal left ureter just above the ureterovesical junction. The more proximal measures 2 mm and the more distal measures 5 mm. No significant hydroureteronephrosis. Bladder is decompressed by Foley catheter.  Stomach/Bowel: Moderate hiatal hernia. Bowel is normal in caliber. There is sigmoid diverticulosis.  Vascular/Lymphatic: Diffuse aortic atherosclerosis. There are no enlarged lymph nodes identified.  Reproductive: Similar appearance of marked prostate  enlargement.  Other: No ascites.  No acute abnormality of the abdominal wall.  Musculoskeletal: There are chronic appearing compression deformities T11, T12, L1, and L4.  IMPRESSION: Two calculi within the distal left ureter just above the ureterovesical junction. Larger measures 5 mm. No significant proximal hydroureteronephrosis.  Multiple nonobstructing bilateral renal calculi measuring up to 5 mm.  Additional chronic findings detailed above.   Electronically Signed By: Macy Mis M.D. On: 07/23/2020 15:03  I have personally reviewed the images and agree with radiologist interpretation.    Assessment & Plan:    1. Elevated PSA Most recent PSA is 17.0 as of 02/21/2020.  Chronic, prostate MRI has been reassuring  2. Recurrent kidney stones/left ureteral stone Recommend left ureteroscopy/stent placement at the time of #2  Risks and benefits of ureteroscopy were reviewed including but not limited to infection, bleeding, pain, ureteral injury which could require open surgery versus prolonged indwelling if ureteral perforation occurs, persistent stone disease, requirement for staged procedure, possible stent, and global anesthesia risks. Patient expressed understanding and desires to proceed with ureteroscopy.   3. BPH with LUTS Refractory urinary retention, failed voiding trials in the setting of massive prostamegaly, 120 g prostate despite Flomax and finasteride.  At this point time, of highly recommended pursuing outlet procedure.  Based on the size and confirmation of his gland, most really recommended consideration of holmium laser enucleation of the prostate.  We reviewed the surgery in detail today including the preoperative, intraoperative, and postoperative course.  This will most likely be an outpatient procedure pending the degree of post op hematuria.  He will go home with catheter for a few days post op and will either be taught how to remove his own catheter or  return to the office for catheter removal. Risk of bleeding, infection, damage surrounding structures, injury to the bladder/ urethral, bladder neck contracture, ureteral stricture, retrograde ejaculation, stress/ urge incontinence, exacerbation of irritative voiding symptoms were all discussed in detail.    Will need cardiology clearance prior to procedure.    [facility] [address] [place], [place] [address] [phone]  I, Selena Batten, am acting as a scribe for Dr. Hollice Espy.  I have reviewed the above documentation for accuracy and completeness, and I agree with the above.   Hollice Espy, MD

## 2020-07-29 ENCOUNTER — Ambulatory Visit (INDEPENDENT_AMBULATORY_CARE_PROVIDER_SITE_OTHER): Payer: Medicare HMO | Admitting: Urology

## 2020-07-29 ENCOUNTER — Other Ambulatory Visit: Payer: Self-pay | Admitting: Radiology

## 2020-07-29 ENCOUNTER — Other Ambulatory Visit: Payer: Self-pay

## 2020-07-29 ENCOUNTER — Encounter: Payer: Self-pay | Admitting: Urology

## 2020-07-29 VITALS — BP 153/54 | HR 62

## 2020-07-29 DIAGNOSIS — N401 Enlarged prostate with lower urinary tract symptoms: Secondary | ICD-10-CM

## 2020-07-29 DIAGNOSIS — N201 Calculus of ureter: Secondary | ICD-10-CM

## 2020-07-29 DIAGNOSIS — R338 Other retention of urine: Secondary | ICD-10-CM

## 2020-07-29 DIAGNOSIS — N138 Other obstructive and reflux uropathy: Secondary | ICD-10-CM

## 2020-08-01 ENCOUNTER — Ambulatory Visit: Payer: Medicare HMO | Admitting: Urology

## 2020-08-04 ENCOUNTER — Other Ambulatory Visit: Payer: Medicare HMO

## 2020-08-04 ENCOUNTER — Other Ambulatory Visit: Payer: Self-pay

## 2020-08-04 DIAGNOSIS — N138 Other obstructive and reflux uropathy: Secondary | ICD-10-CM

## 2020-08-04 DIAGNOSIS — N401 Enlarged prostate with lower urinary tract symptoms: Secondary | ICD-10-CM

## 2020-08-05 ENCOUNTER — Ambulatory Visit: Payer: Medicare HMO | Admitting: Urology

## 2020-08-05 ENCOUNTER — Other Ambulatory Visit
Admission: RE | Admit: 2020-08-05 | Discharge: 2020-08-05 | Disposition: A | Discharge status: Home / Self Care | Payer: Medicare HMO | Source: Ambulatory Visit | Attending: Urology | Admitting: Urology

## 2020-08-05 HISTORY — DX: Personal history of urinary calculi: Z87.442

## 2020-08-05 LAB — URINALYSIS, COMPLETE
Bilirubin, UA: NEGATIVE
Glucose, UA: NEGATIVE
Ketones, UA: NEGATIVE
Leukocytes,UA: NEGATIVE
Nitrite, UA: NEGATIVE
Protein,UA: NEGATIVE
Specific Gravity, UA: 1.02 (ref 1.005–1.030)
Urobilinogen, Ur: 0.2 mg/dL (ref 0.2–1.0)
pH, UA: 5.5 (ref 5.0–7.5)

## 2020-08-05 LAB — MICROSCOPIC EXAMINATION

## 2020-08-05 NOTE — Patient Instructions (Signed)
Your procedure is scheduled on: Monday August 11, 2020. Report to Day Surgery inside [facility] 2nd floor. To find out your arrival time please call [phone] between 1PM - 3PM on Friday August 08, 2020.  Remember: Instructions that are not followed completely may result in serious medical risk,  up to and including death, or upon the discretion of your surgeon and anesthesiologist your  surgery may need to be rescheduled.     _X__ 1. Do not eat food after midnight the night before your procedure.                 No chewing gum or hard candies. You may drink clear liquids up to 2 hours                 before you are scheduled to arrive for your surgery- DO not drink clear                 liquids within 2 hours of the start of your surgery.                 Clear Liquids include:  water, apple juice without pulp, clear Gatorade, G2 or                  Gatorade Zero (avoid Red/Purple/Blue), Black Coffee or Tea (Do not add                 anything to coffee or tea).  __X__2.  On the morning of surgery brush your teeth with toothpaste and water, you                may rinse your mouth with mouthwash if you wish.  Do not swallow any toothpaste of mouthwash.     _X__ 3.  No Alcohol for 24 hours before or after surgery.   _X__ 4.  Do Not Smoke or use e-cigarettes For 24 Hours Prior to Your Surgery.                 Do not use any chewable tobacco products for at least 6 hours prior to                 Surgery.  _X__  5.  Do not use any recreational drugs (marijuana, cocaine, heroin, ecstasy, MDMA or other)                For at least one week prior to your surgery.  Combination of these drugs with anesthesia                May have life threatening results.   __X__ 6.  Notify your doctor if there is any change in your medical condition      (cold, fever, infections).     Do not wear jewelry, make-up, hairpins, clips or nail polish. Do not wear lotions, powders,  or perfumes. You may wear deodorant. Do not shave 48 hours prior to surgery. Men may shave face and neck. Do not bring valuables to the hospital.    [facility] is not responsible for any belongings or valuables.  Contacts, dentures or bridgework may not be worn into surgery. Leave your suitcase in the car. After surgery it may be brought to your room. For patients admitted to the hospital, discharge time is determined by your treatment team.   Patients discharged the day of surgery will not be allowed to drive home.   Make arrangements for someone to  be with you for the first 24 hours of your Same Day Discharge.   __X__ Take these medicines the morning of surgery with A SIP OF WATER:    1. finasteride (PROSCAR) 5 MG   2. metoprolol succinate (TOPROL-XL) 25 MG  3. tamsulosin (FLOMAX) 0.4 MG  __X__ Stop Anti-inflammatories such as Ibuprofen, Aleve, Advil, naproxen, aspirin and or BC powders.    __X__ Stop supplements until after surgery.    __X__ Do not start any herbal supplements before your surgery.    If you have any questions regarding your pre-procedure instructions,  Please call Pre-admit Testing at [phone].

## 2020-08-06 ENCOUNTER — Ambulatory Visit: Payer: Medicare HMO | Admitting: Urology

## 2020-08-06 DIAGNOSIS — Z01818 Encounter for other preprocedural examination: Secondary | ICD-10-CM | POA: Diagnosis not present

## 2020-08-06 DIAGNOSIS — I480 Paroxysmal atrial fibrillation: Secondary | ICD-10-CM | POA: Diagnosis not present

## 2020-08-07 ENCOUNTER — Other Ambulatory Visit
Admission: RE | Admit: 2020-08-07 | Discharge: 2020-08-07 | Disposition: A | Discharge status: Home / Self Care | Payer: Medicare HMO | Source: Ambulatory Visit | Attending: Urology | Admitting: Urology

## 2020-08-07 DIAGNOSIS — Z20822 Contact with and (suspected) exposure to covid-19: Secondary | ICD-10-CM | POA: Diagnosis not present

## 2020-08-07 DIAGNOSIS — Z01812 Encounter for preprocedural laboratory examination: Secondary | ICD-10-CM | POA: Diagnosis not present

## 2020-08-07 LAB — SARS CORONAVIRUS 2 (TAT 6-24 HRS): SARS Coronavirus 2: NEGATIVE

## 2020-08-07 LAB — CULTURE, URINE COMPREHENSIVE

## 2020-08-08 NOTE — Progress Notes (Signed)
[facility] Perioperative Services  Pre-Admission/Anesthesia Testing Clinical Review  Date: 08/08/20  Patient Demographics:  Name: [name] DOB:   [birth year] MRN:   [id number]  Planned Surgical Procedure(s):    Case: [id number] Date/Time: 08/11/20 1017   Procedures:      HOLEP-LASER ENUCLEATION OF THE PROSTATE WITH MORCELLATION (N/A )     CYSTOSCOPY/URETEROSCOPY/HOLMIUM LASER/STENT PLACEMENT (Left )   Anesthesia type: General   Pre-op diagnosis: benign prostatic hypertrophy with urinary retention, left ureteral stone   Location: [facility] OR ROOM 10 / [place] ORS FOR ANESTHESIA GROUP   Surgeons: Hollice Espy, MD     NOTE: Available PAT nursing documentation and vital signs have been reviewed. Clinical nursing staff has updated patient's PMH/PSHx, current medication list, and drug allergies/intolerances to ensure comprehensive history available to assist in medical decision making as it pertains to the aforementioned surgical procedure and anticipated anesthetic course.   Clinical Discussion:  [name] is a 78 y.o. male who is submitted for pre-surgical anesthesia review and clearance prior to him undergoing the above procedure. Patient has never been a smoke. Pertinent PMH includes: A. fib, left ventricular dysfunction, symptomatic PVCs, HTN, HLD, aorto-iliac atherosclerosis, exertional dyspnea, OSA (not on nocturnal PAP therapy), anemia, carotid stenosis, recurrent urolithiasis, lumbar spondylosis and scoliosis, anxiety, depression, BPH with urinary obstruction.  Patient is followed by cardiology Ubaldo Glassing, MD). He was last seen in the cardiology clinic on 08/06/2020; notes reviewed. Patient doing well overall. He complained of mild intermittent chest pain symptoms that were not associated with activity. Patient denied orthopnea or PND.  Patient with occasional shortness of breath.  Patient is not experienced any palpitations, vertiginous symptoms, presyncope/syncope.  EKG  in the office revealed sinus rhythm at a rate of 62 bpm.  Physical exam revealed a grade 2/6 systolic blowing murmur heard best at the lower LSB.  Patient with mild peripheral edema (1+). This patient is not on daily anticoagulation.  Patient being seen in consult to establish care and for preoperative risk ratification as requested by urology. Previous cardiac workup reviewed. Last heart catheterization in 2013 revealed no significant CAD; LVEF 45%. TTF revealed mild MR and TR with LVEF of 45-50%.  Following consult visit, Dr. Ubaldo Glassing advised that intermittent CP does not seem to be ischemic in nature and that patient appears to be optimized for surgery. Clearance to proceed with an acceptable risk stratification issued by cardiology on 08/08/2020.  He denies previous intra-operative complications with anesthesia.  Vitals with BMI 08/05/2020 07/29/2020 07/23/2020  Height 5\' 7"  - -  Weight 155 lbs (No Data) -  BMI 38.10 - -  Systolic - 175 102  Diastolic - 54 63  Pulse - 62 78    Providers/Specialists:   NOTE: Primary physician provider listed below. Patient may have been seen by APP or partner within same practice.   PROVIDER ROLE LAST Lu Duffel, MD Urology (Surgeon) 07/29/2020  [name], MD Primary Care Provider 02/26/2020  Bartholome Bill, MD Cardiology 08/06/2020   Allergies:  Patient has no known allergies.  Current Home Medications:   No current facility-administered medications for this encounter.   . Calcium Carb-Cholecalciferol (CALCIUM 600+D3 PO)  . finasteride (PROSCAR) 5 MG tablet  . fluconazole (DIFLUCAN) 200 MG tablet  . metoprolol succinate (TOPROL-XL) 25 MG 24 hr tablet  . Multiple Vitamin (MULTIVITAMIN WITH MINERALS) TABS tablet  . tamsulosin (FLOMAX) 0.4 MG CAPS capsule   History:   Past Medical History:  Diagnosis Date  . Actinic keratosis   .  BPH with urinary obstruction    sees urology Dr. [name]  . Carotid stenosis 07/20/2016   Minimal on [place]  07/2016. Monitor clinically.  . Depression with anxiety   . Elevated PSA 2014   sees urology - normal biopsies x 3  . History of kidney stones   . HLD (hyperlipidemia)   . Left kidney mass 09/2014   on CT scan thought consistent with simple cyst on prior US  . Nasal obstruction    congenital R sided  . Obstructive pyelonephritis 09/2014   s/p hospitalization and treatment with levaquin  . OSA (obstructive sleep apnea)    pt denies  . Osteoporosis 06/04/2018   DEXA T score -37 at L spine  . Recurrent kidney stones 2014   extensive bilateral nephrolithiasis, sees urology [name]), hydrocodone prn  . Squamous cell carcinoma of skin 07/25/2019   crown scalp  . Squamous cell carcinoma of skin 06/29/2017   SCC IS ant to crown post  . Symptomatic PVCs    per prior cardiologist, on beta blocker  . Urge incontinence    thought overactive bladder   Past Surgical History:  Procedure Laterality Date  . CARDIAC CATHETERIZATION  03/2012   EF 45-50%, no LVH, + impaired LV relaxation, no PH, no valve abnormalities  . CARDIOVASCULAR STRESS TEST  03/2012   mild peri infarct ischemia in apical segment, decreased EF  . CATARACT EXTRACTION Right 01/2013  . COLONOSCOPY  02/2008   mod diverticulosis o/w WNL (2009)  . CYSTOSCOPY    . CYSTOSCOPY  10/2014   s/p stone removal, with stent removal [name])  . TONSILECTOMY, ADENOIDECTOMY, BILATERAL MYRINGOTOMY AND TUBES     removed as a child  . US ECHOCARDIOGRAPHY  03/2012   hypokinetic anterior wall, EF 45-50%, impaired relaxation pattern, mild LA dilation, mild-mod MR, mild-mod AR   Family History  Problem Relation Age of Onset  . Urolithiasis Daughter   . Kidney Stones Mother   . Diabetes Neg Hx   . Cancer Neg Hx   . CAD Neg Hx   . Stroke Neg Hx   . Hypertension Neg Hx   . Prostate cancer Neg Hx   . Kidney cancer Neg Hx   . Bladder Cancer Neg Hx    Social History   Tobacco Use  . Smoking status: Never Smoker  . Smokeless tobacco: Never  Used  Vaping Use  . Vaping Use: Never used  Substance Use Topics  . Alcohol use: No  . Drug use: No    Pertinent Clinical Results:  LABS: Labs reviewed: Acceptable for surgery.      Hospital Outpatient Visit on 08/07/2020  Component Date Value Ref Range Status  . SARS Coronavirus 2 08/07/2020 NEGATIVE  NEGATIVE Final    ECG: Date: 08/07/2020 Rate: 62 bpm Rhythm: normal sinus Intervals: PR 162 ms. QTc 420 ms. NOTE: Tracing obtained at [facility]; unable for review. Above based on cardiologist's interpretation.     IMAGING / PROCEDURES: CT RENAL STUDY done on 07/23/2020 1. There are chronic appearing compression deformities T11, T12, L1, and L4. 2. Two calculi within the distal left ureter just above the ureterovesical junction. Larger measures 5 mm.  3. No significant proximal hydroureteronephrosis. 4. Multiple nonobstructing bilateral renal calculi measuring up to 5 mm.  CAROTID DUPLEX done on 08/09/2016 1. Heterogeneous plaque, bilaterally. 2. 1-39% ICA stenosis, bilaterally. 3. Normal subclavian arteries, bilaterally. 4. Patent vertebral arteries with antegrade flow  ECHOCARDIOGRAM done on 03/13/2012 1. This is a technically adequate  study.  2. Left ventricle: Normal left ventricular chamber size, and contractility noted.  3. There anterior wall appear hypokinetic.  4. Ejection Fraction estimated at  45-50%.  5. Mitral inflow velocities indicate impaired relaxation pattern.  6. Right Ventricle: The right ventricle has grossly normal size and function.  7. Right atrium: normal size  8. Left atrium: Mildly dilated.  9. Mitral valve: There is mild mitral annular calcification , with mild to moderate mitral regurgitation identified.  10. Aortic valve: The aortic valve is calcified but opens well. There is no evidence of aortic stenosis. Mild to moderate aortic regurgitation noted.  11. Tricuspid valve:  The tricuspid valve is grossly normal with trace tricuspid  regurgitation.  12. Pulmonic valve: not well seen. Mild pulmonic regurgitation seen.  13. Pericardium: There is no evidence of pericardial effusion.  14. Aorta: The aortic root is mildly dilated.  15. Intra- atrial septum appears to be within normal limits.    CARDIAC CATHETERIZATION done in 03/2012 1. EF 45-50% 2. No LVH 3. Impaired LV relaxation 4. No pulmonary hypertension 5. No valvular abnormalities  LEXISCAN done on 03/09/2012 1. Mild peri infarct ischemia in the apical segment.  2. Depressed  ejection fraction of 30%. 3. The patient's EKG during monitoring period was unchanged without evidence of ischemia by EKG criteria.  4. EKG during recovery segment was noted to be unremarkable 5. No evidence of inducible ischemia by EKG criteria  Impression and Plan:  Chanc Kervin has been referred for pre-anesthesia review and clearance prior him undergoing the planned anesthetic and procedural courses. Available labs, pertinent testing, and imaging results were personally reviewed by me. This patient has been appropriately cleared by cardiology.   Based on clinical review performed today (08/08/20), barring any significant acute changes in the patient's overall condition, it is anticipated that he will be able to proceed with the planned surgical intervention. Any acute changes in clinical condition may necessitate his procedure being postponed and/or cancelled. Pre-surgical instructions were reviewed with the patient during his PAT appointment and questions were fielded by PAT clinical staff.  Honor Loh, MSN, APRN, FNP-C, CEN [facility]  Peri-operative Services Nurse Practitioner Phone: [phone] 08/08/20 12:33 PM  NOTE: This note has been prepared using Dragon dictation software. Despite my best ability to proofread, there is always the potential that unintentional transcriptional errors may still occur from this process.

## 2020-08-11 ENCOUNTER — Ambulatory Visit: Payer: Medicare HMO | Admitting: Urgent Care

## 2020-08-11 ENCOUNTER — Other Ambulatory Visit: Payer: Self-pay

## 2020-08-11 ENCOUNTER — Ambulatory Visit
Admission: RE | Admit: 2020-08-11 | Discharge: 2020-08-11 | Disposition: A | Discharge status: Home / Self Care | Payer: Medicare HMO | Attending: Urology | Admitting: Urology

## 2020-08-11 ENCOUNTER — Encounter: Payer: Self-pay | Admitting: Urology

## 2020-08-11 ENCOUNTER — Encounter
Admission: RE | Disposition: A | Discharge status: Home / Self Care | Payer: Self-pay | Source: Home / Self Care | Attending: Urology

## 2020-08-11 ENCOUNTER — Ambulatory Visit: Payer: Medicare HMO

## 2020-08-11 DIAGNOSIS — F419 Anxiety disorder, unspecified: Secondary | ICD-10-CM | POA: Insufficient documentation

## 2020-08-11 DIAGNOSIS — N201 Calculus of ureter: Secondary | ICD-10-CM | POA: Diagnosis not present

## 2020-08-11 DIAGNOSIS — E785 Hyperlipidemia, unspecified: Secondary | ICD-10-CM | POA: Diagnosis not present

## 2020-08-11 DIAGNOSIS — R338 Other retention of urine: Secondary | ICD-10-CM | POA: Insufficient documentation

## 2020-08-11 DIAGNOSIS — I493 Ventricular premature depolarization: Secondary | ICD-10-CM | POA: Diagnosis not present

## 2020-08-11 DIAGNOSIS — M810 Age-related osteoporosis without current pathological fracture: Secondary | ICD-10-CM | POA: Diagnosis not present

## 2020-08-11 DIAGNOSIS — I10 Essential (primary) hypertension: Secondary | ICD-10-CM | POA: Diagnosis not present

## 2020-08-11 DIAGNOSIS — G4730 Sleep apnea, unspecified: Secondary | ICD-10-CM | POA: Diagnosis not present

## 2020-08-11 DIAGNOSIS — F329 Major depressive disorder, single episode, unspecified: Secondary | ICD-10-CM | POA: Diagnosis not present

## 2020-08-11 DIAGNOSIS — D649 Anemia, unspecified: Secondary | ICD-10-CM | POA: Diagnosis not present

## 2020-08-11 DIAGNOSIS — Z841 Family history of disorders of kidney and ureter: Secondary | ICD-10-CM | POA: Insufficient documentation

## 2020-08-11 DIAGNOSIS — N401 Enlarged prostate with lower urinary tract symptoms: Secondary | ICD-10-CM | POA: Insufficient documentation

## 2020-08-11 DIAGNOSIS — Z85828 Personal history of other malignant neoplasm of skin: Secondary | ICD-10-CM | POA: Diagnosis not present

## 2020-08-11 DIAGNOSIS — G4733 Obstructive sleep apnea (adult) (pediatric): Secondary | ICD-10-CM | POA: Diagnosis not present

## 2020-08-11 DIAGNOSIS — N138 Other obstructive and reflux uropathy: Secondary | ICD-10-CM

## 2020-08-11 DIAGNOSIS — R310 Gross hematuria: Secondary | ICD-10-CM | POA: Diagnosis not present

## 2020-08-11 DIAGNOSIS — N320 Bladder-neck obstruction: Secondary | ICD-10-CM | POA: Diagnosis not present

## 2020-08-11 DIAGNOSIS — Z87442 Personal history of urinary calculi: Secondary | ICD-10-CM | POA: Insufficient documentation

## 2020-08-11 DIAGNOSIS — Z9841 Cataract extraction status, right eye: Secondary | ICD-10-CM | POA: Insufficient documentation

## 2020-08-11 DIAGNOSIS — I6529 Occlusion and stenosis of unspecified carotid artery: Secondary | ICD-10-CM | POA: Diagnosis not present

## 2020-08-11 HISTORY — PX: CYSTOSCOPY/URETEROSCOPY/HOLMIUM LASER/STENT PLACEMENT: SHX6546

## 2020-08-11 SURGERY — ENUCLEATION, PROSTATE, USING LASER, WITH MORCELLATION
Anesthesia: General

## 2020-08-11 MED ORDER — CHLORHEXIDINE GLUCONATE 0.12 % MT SOLN: 15.0000 mL | Freq: Once | OROMUCOSAL | Status: AC

## 2020-08-11 MED ORDER — FENTANYL CITRATE (PF) 100 MCG/2ML IJ SOLN
INTRAMUSCULAR | Status: AC
  Administered 2020-08-11: 25 ug via INTRAVENOUS
  Filled 2020-08-11: qty 2

## 2020-08-11 MED ORDER — FUROSEMIDE 10 MG/ML IJ SOLN
INTRAMUSCULAR | Status: DC | PRN
  Administered 2020-08-11: 10 mg via INTRAMUSCULAR

## 2020-08-11 MED ORDER — LIDOCAINE HCL (CARDIAC) PF 100 MG/5ML IV SOSY
PREFILLED_SYRINGE | INTRAVENOUS | Status: DC | PRN
  Administered 2020-08-11: 100 mg via INTRAVENOUS

## 2020-08-11 MED ORDER — ROCURONIUM BROMIDE 100 MG/10ML IV SOLN
INTRAVENOUS | Status: DC | PRN
  Administered 2020-08-11: 40 mg via INTRAVENOUS
  Administered 2020-08-11 (×3): 10 mg via INTRAVENOUS

## 2020-08-11 MED ORDER — DEXAMETHASONE SODIUM PHOSPHATE 10 MG/ML IJ SOLN
INTRAMUSCULAR | Status: DC | PRN
  Administered 2020-08-11: 5 mg via INTRAVENOUS

## 2020-08-11 MED ORDER — FENTANYL CITRATE (PF) 100 MCG/2ML IJ SOLN
INTRAMUSCULAR | Status: AC
  Filled 2020-08-11: qty 2

## 2020-08-11 MED ORDER — PROPOFOL 10 MG/ML IV BOLUS
INTRAVENOUS | Status: DC | PRN
  Administered 2020-08-11: 100 mg via INTRAVENOUS

## 2020-08-11 MED ORDER — SODIUM CHLORIDE 0.9 % IV SOLN
INTRAVENOUS | Status: DC | PRN
  Administered 2020-08-11: 50 ug/min via INTRAVENOUS

## 2020-08-11 MED ORDER — CHLORHEXIDINE GLUCONATE 0.12 % MT SOLN
OROMUCOSAL | Status: AC
  Administered 2020-08-11: 15 mL via OROMUCOSAL
  Filled 2020-08-11: qty 15

## 2020-08-11 MED ORDER — FENTANYL CITRATE (PF) 100 MCG/2ML IJ SOLN
25.0000 ug | INTRAMUSCULAR | Status: DC | PRN
  Administered 2020-08-11 (×4): 25 ug via INTRAVENOUS

## 2020-08-11 MED ORDER — HYDROCODONE-ACETAMINOPHEN 5-325 MG PO TABS: 1.0000 | ORAL_TABLET | Freq: Four times a day (QID) | ORAL | 0 refills | Status: DC | PRN

## 2020-08-11 MED ORDER — CEFAZOLIN SODIUM-DEXTROSE 2-4 GM/100ML-% IV SOLN
INTRAVENOUS | Status: AC
  Filled 2020-08-11: qty 100

## 2020-08-11 MED ORDER — ONDANSETRON HCL 4 MG/2ML IJ SOLN
INTRAMUSCULAR | Status: DC | PRN
  Administered 2020-08-11: 4 mg via INTRAVENOUS

## 2020-08-11 MED ORDER — ORAL CARE MOUTH RINSE: 15.0000 mL | Freq: Once | OROMUCOSAL | Status: AC

## 2020-08-11 MED ORDER — CEFAZOLIN SODIUM-DEXTROSE 2-4 GM/100ML-% IV SOLN
2.0000 g | INTRAVENOUS | Status: AC
  Administered 2020-08-11: 2 g via INTRAVENOUS

## 2020-08-11 MED ORDER — PHENYLEPHRINE HCL (PRESSORS) 10 MG/ML IV SOLN
INTRAVENOUS | Status: DC | PRN
  Administered 2020-08-11 (×5): 100 ug via INTRAVENOUS

## 2020-08-11 MED ORDER — SUGAMMADEX SODIUM 200 MG/2ML IV SOLN
INTRAVENOUS | Status: DC | PRN
  Administered 2020-08-11: 200 mg via INTRAVENOUS

## 2020-08-11 MED ORDER — FENTANYL CITRATE (PF) 100 MCG/2ML IJ SOLN
INTRAMUSCULAR | Status: DC | PRN
Start: 2020-08-11 — End: 2020-08-11
  Administered 2020-08-11 (×2): 50 ug via INTRAVENOUS

## 2020-08-11 MED ORDER — ONDANSETRON HCL 4 MG/2ML IJ SOLN: 4.0000 mg | Freq: Once | INTRAMUSCULAR | Status: DC | PRN

## 2020-08-11 MED ORDER — FAMOTIDINE 20 MG PO TABS: 20.0000 mg | ORAL_TABLET | Freq: Once | ORAL | Status: AC

## 2020-08-11 MED ORDER — LACTATED RINGERS IV SOLN: INTRAVENOUS | Status: DC

## 2020-08-11 MED ORDER — FAMOTIDINE 20 MG PO TABS
ORAL_TABLET | ORAL | Status: AC
  Administered 2020-08-11: 20 mg via ORAL
  Filled 2020-08-11: qty 1

## 2020-08-11 MED ORDER — SEVOFLURANE IN SOLN
RESPIRATORY_TRACT | Status: AC
  Filled 2020-08-11: qty 250

## 2020-08-11 SURGICAL SUPPLY — 53 items
ADAPTER IRRIG TUBE 2 SPIKE SOL (ADAPTER) ×6 IMPLANT
ADPR TBG 2 SPK PMP STRL ASCP (ADAPTER) ×4
BAG DRAIN CYSTO-URO LG1000N (MISCELLANEOUS) ×3 IMPLANT
BAG DRN LRG CPC RND TRDRP CNTR (MISCELLANEOUS)
BAG DRN RND TRDRP ANRFLXCHMBR (UROLOGICAL SUPPLIES)
BAG URINE DRAIN 2000ML AR STRL (UROLOGICAL SUPPLIES) IMPLANT
BAG URO DRAIN 4000ML (MISCELLANEOUS) IMPLANT
BASKET ZERO TIP 1.9FR (BASKET) IMPLANT
BRUSH SCRUB EZ 1% IODOPHOR (MISCELLANEOUS) ×3 IMPLANT
BSKT STON RTRVL ZERO TP 1.9FR (BASKET)
CATH FOL 2WAY LX 20X30 (CATHETERS) IMPLANT
CATH FOL 2WAY LX 22X30 (CATHETERS) ×3 IMPLANT
CATH FOLEY 3WAY 30CC 22FR (CATHETERS) IMPLANT
CATH URETL 5X70 OPEN END (CATHETERS) ×3 IMPLANT
CNTNR SPEC 2.5X3XGRAD LEK (MISCELLANEOUS)
CONT SPEC 4OZ STER OR WHT (MISCELLANEOUS)
CONT SPEC 4OZ STRL OR WHT (MISCELLANEOUS)
CONTAINER COLLECT MORCELLATR (MISCELLANEOUS) ×2 IMPLANT
CONTAINER SPEC 2.5X3XGRAD LEK (MISCELLANEOUS) IMPLANT
DRAPE 3/4 80X56 (DRAPES) ×3 IMPLANT
DRAPE UTILITY 15X26 TOWEL STRL (DRAPES) ×3 IMPLANT
FIBER LASER TRACTIP 200 (UROLOGICAL SUPPLIES) ×3 IMPLANT
FILTER OVERFLOW MORCELLATOR (FILTER) ×2 IMPLANT
GLIDEWIRE STIFF .35X180X3 HYDR (WIRE) ×3 IMPLANT
GLOVE BIO SURGEON STRL SZ 6.5 (GLOVE) ×6 IMPLANT
GOWN STRL REUS W/ TWL LRG LVL3 (GOWN DISPOSABLE) ×4 IMPLANT
GOWN STRL REUS W/TWL LRG LVL3 (GOWN DISPOSABLE) ×6
GUIDEWIRE GREEN .038 145CM (MISCELLANEOUS) IMPLANT
GUIDEWIRE STR DUAL SENSOR (WIRE) ×3 IMPLANT
HOLDER FOLEY CATH W/STRAP (MISCELLANEOUS) ×3 IMPLANT
INFUSOR MANOMETER BAG 3000ML (MISCELLANEOUS) ×3 IMPLANT
INTRODUCER DILATOR DOUBLE (INTRODUCER) IMPLANT
KIT TURNOVER CYSTO (KITS) ×3 IMPLANT
LASER FIBER FLEXIVA 550 (UROLOGICAL SUPPLIES) ×3 IMPLANT
MBRN O SEALING YLW 17 FOR INST (MISCELLANEOUS) ×3
MEMBRANE SLNG YLW 17 FOR INST (MISCELLANEOUS) ×2 IMPLANT
MORCELLATOR COLLECT CONTAINER (MISCELLANEOUS) ×3
MORCELLATOR OVERFLOW FILTER (FILTER) ×3
MORCELLATOR ROTATION 4.75 335 (MISCELLANEOUS) ×3 IMPLANT
PACK CYSTO AR (MISCELLANEOUS) ×3 IMPLANT
SET CYSTO W/LG BORE CLAMP LF (SET/KITS/TRAYS/PACK) ×3 IMPLANT
SET IRRIG Y TYPE TUR BLADDER L (SET/KITS/TRAYS/PACK) ×3 IMPLANT
SHEATH URETERAL 12FRX35CM (MISCELLANEOUS) IMPLANT
SLEEVE PROTECTION STRL DISP (MISCELLANEOUS) ×6 IMPLANT
SOL .9 NS 3000ML IRR  AL (IV SOLUTION) ×34
SOL .9 NS 3000ML IRR AL (IV SOLUTION) ×34
SOL .9 NS 3000ML IRR UROMATIC (IV SOLUTION) ×34 IMPLANT
STENT URET 6FRX24 CONTOUR (STENTS) IMPLANT
STENT URET 6FRX26 CONTOUR (STENTS) ×3 IMPLANT
SURGILUBE 2OZ TUBE FLIPTOP (MISCELLANEOUS) ×3 IMPLANT
SYRINGE IRR TOOMEY STRL 70CC (SYRINGE) ×3 IMPLANT
TUBE PUMP MORCELLATOR PIRANHA (TUBING) ×3 IMPLANT
WATER STERILE IRR 1000ML POUR (IV SOLUTION) ×3 IMPLANT

## 2020-08-11 NOTE — Anesthesia Postprocedure Evaluation (Signed)
Anesthesia Post Note  Patient: [name]  Procedure(s) Performed: HOLEP-LASER ENUCLEATION OF THE PROSTATE WITH MORCELLATION (N/A ) CYSTOSCOPY/URETEROSCOPY/STENT PLACEMENT (Left )  Patient location during evaluation: PACU Anesthesia Type: General Level of consciousness: awake and alert Pain management: pain level controlled Vital Signs Assessment: post-procedure vital signs reviewed and stable Respiratory status: spontaneous breathing, nonlabored ventilation, respiratory function stable and patient connected to nasal cannula oxygen Cardiovascular status: blood pressure returned to baseline and stable Postop Assessment: no apparent nausea or vomiting Anesthetic complications: no   No complications documented.   Last Vitals:  Vitals:   08/11/20 1422 08/11/20 1438  BP: 109/63 105/64  Pulse: 64 64  Resp: 17 12  Temp:    SpO2: 93% 97%    Last Pain:  Vitals:   08/11/20 1438  TempSrc:   PainSc: 3                  Arita Miss

## 2020-08-11 NOTE — Interval H&P Note (Signed)
History and Physical Interval Note:  08/11/2020 10:09 AM  [name]  has presented today for surgery, with the diagnosis of benign prostatic hypertrophy with urinary retention, left ureteral stone.  The various methods of treatment have been discussed with the patient and family. After consideration of risks, benefits and other options for treatment, the patient has consented to  Procedure(s): [place] WITH MORCELLATION (N/A) CYSTOSCOPY/URETEROSCOPY/HOLMIUM LASER/STENT PLACEMENT (Left) as a surgical intervention.  The patient's history has been reviewed, patient examined, no change in status, stable for surgery.  I have reviewed the patient's chart and labs.  Questions were answered to the patient's satisfaction.    RRR CTAB  Understands may not be able to address left sided stone but will attempt  Hollice Espy

## 2020-08-11 NOTE — Transfer of Care (Signed)
Immediate Anesthesia Transfer of Care Note  Patient: [name]  Procedure(s) Performed: HOLEP-LASER ENUCLEATION OF THE PROSTATE WITH MORCELLATION (N/A ) CYSTOSCOPY/URETEROSCOPY/STENT PLACEMENT (Left )  Patient Location: PACU  Anesthesia Type:General  Level of Consciousness: awake and alert   Airway & Oxygen Therapy: Patient Spontanous Breathing  Post-op Assessment: Report given to RN  Post vital signs: Reviewed and stable  Last Vitals:  Vitals Value Taken Time  BP 103/62 08/11/20 1324  Temp    Pulse 54 08/11/20 1325  Resp 22 08/11/20 1325  SpO2 98 % 08/11/20 1325  Vitals shown include unvalidated device data.  Last Pain:  Vitals:   08/11/20 0912  TempSrc: Temporal  PainSc: 0-No pain         Complications: No complications documented.

## 2020-08-11 NOTE — Anesthesia Procedure Notes (Signed)
Procedure Name: Intubation Date/Time: 08/11/2020 7:45 AM Performed by: Louann Sjogren, CRNA Pre-anesthesia Checklist: Patient identified, Patient being monitored, Timeout performed, Emergency Drugs available and Suction available Patient Re-evaluated:Patient Re-evaluated prior to induction Oxygen Delivery Method: Circle system utilized Preoxygenation: Pre-oxygenation with 100% oxygen Induction Type: IV induction Ventilation: Mask ventilation without difficulty Laryngoscope Size: McGraph and 3 Grade View: Grade I Tube type: Oral Tube size: 7.0 mm Number of attempts: 1 Airway Equipment and Method: Stylet Placement Confirmation: ETT inserted through vocal cords under direct vision,  positive ETCO2 and breath sounds checked- equal and bilateral Secured at: 21 cm Tube secured with: Tape Dental Injury: Teeth and Oropharynx as per pre-operative assessment  Comments: Unable to visualize cords with mac 3 DL

## 2020-08-11 NOTE — Progress Notes (Signed)
Dr. [name] assessed foley drainage and leaking around foley catheter. Patient okay to go home with large drainage bag.

## 2020-08-11 NOTE — Anesthesia Preprocedure Evaluation (Addendum)
Anesthesia Evaluation  Patient identified by MRN, date of birth, ID band Patient awake    Reviewed: Allergy & Precautions, NPO status , Patient's Chart, lab work & pertinent test results  History of Anesthesia Complications Negative for: history of anesthetic complications  Airway Mallampati: II       Dental   Pulmonary sleep apnea (pt denies) , neg COPD, Not current smoker,           Cardiovascular hypertension, Pt. on medications (-) Past MI and (-) CHF (-) dysrhythmias (-) Valvular Problems/Murmurs     Neuro/Psych neg Seizures Anxiety Depression    GI/Hepatic Neg liver ROS, neg GERD  ,  Endo/Other  neg diabetes  Renal/GU Renal InsufficiencyRenal disease (stones)     Musculoskeletal   Abdominal   Peds  Hematology  (+) anemia ,   Anesthesia Other Findings   Reproductive/Obstetrics                            Anesthesia Physical Anesthesia Plan  ASA: II  Anesthesia Plan: General   Post-op Pain Management:    Induction: Intravenous  PONV Risk Score and Plan: 2 and Ondansetron and Dexamethasone  Airway Management Planned: Oral ETT  Additional Equipment:   Intra-op Plan:   Post-operative Plan:   Informed Consent: I have reviewed the patients History and Physical, chart, labs and discussed the procedure including the risks, benefits and alternatives for the proposed anesthesia with the patient or authorized representative who has indicated his/her understanding and acceptance.       Plan Discussed with:   Anesthesia Plan Comments:         Anesthesia Quick Evaluation

## 2020-08-11 NOTE — Discharge Instructions (Addendum)
Indwelling Urinary Catheter Care, Adult An indwelling urinary catheter is a thin tube that is put into your bladder. The tube helps to drain pee (urine) out of your body. The tube goes in through your urethra. Your urethra is where pee comes out of your body. Your pee will come out through the catheter, then it will go into a bag (drainage bag). Take good care of your catheter so it will work well. How to wear your catheter and bag Supplies needed  Sticky tape (adhesive tape) or a leg strap.  Alcohol wipe or soap and water (if you use tape).  A clean towel (if you use tape).  Large overnight bag.  Smaller bag (leg bag). Wearing your catheter Attach your catheter to your leg with tape or a leg strap.  Make sure the catheter is not pulled tight.  If a leg strap gets wet, take it off and put on a dry strap.  If you use tape to hold the bag on your leg: 1. Use an alcohol wipe or soap and water to wash your skin where the tape made it sticky before. 2. Use a clean towel to pat-dry that skin. 3. Use new tape to make the bag stay on your leg. Wearing your bags You should have been given a large overnight bag.  You may wear the overnight bag in the day or night.  Always have the overnight bag lower than your bladder.  Do not let the bag touch the floor.  Before you go to sleep, put a clean plastic bag in a wastebasket. Then hang the overnight bag inside the wastebasket. You should also have a smaller leg bag that fits under your clothes.  Always wear the leg bag below your knee.  Do not wear your leg bag at night. How to care for your skin and catheter Supplies needed  A clean washcloth.  Water and mild soap.  A clean towel. Caring for your skin and catheter      Clean the skin around your catheter every day: 1. Wash your hands with soap and water. 2. Wet a clean washcloth in warm water and mild soap. 3. Clean the skin around your urethra.  If you are  male:  Gently spread the folds of skin around your vagina (labia).  With the washcloth in your other hand, wipe the inner side of your labia on each side. Wipe from front to back.  If you are male:  Pull back any skin that covers the end of your penis (foreskin).  With the washcloth in your other hand, wipe your penis in small circles. Start wiping at the tip of your penis, then move away from the catheter.  Move the foreskin back in place, if needed. 4. With your free hand, hold the catheter close to where it goes into your body.  Keep holding the catheter during cleaning so it does not get pulled out. 5. With the washcloth in your other hand, clean the catheter.  Only wipe downward on the catheter.  Do not wipe upward toward your body. Doing this may push germs into your urethra and cause infection. 6. Use a clean towel to pat-dry the catheter and the skin around it. Make sure to wipe off all soap. 7. Wash your hands with soap and water.  Shower every day. Do not take baths.  Do not use cream, ointment, or lotion on the area where the catheter goes into your body, unless your doctor tells you   to.  Do not use powders, sprays, or lotions on your genital area.  Check your skin around the catheter every day for signs of infection. Check for: ? Redness, swelling, or pain. ? Fluid or blood. ? Warmth. ? Pus or a bad smell. How to empty the bag Supplies needed  Rubbing alcohol.  Gauze pad or cotton ball.  Tape or a leg strap. Emptying the bag Pour the pee out of your bag when it is ?- full, or at least 2-3 times a day. Do this for your overnight bag and your leg bag. 1. Wash your hands with soap and water. 2. Separate (detach) the bag from your leg. 3. Hold the bag over the toilet or a clean pail. Keep the bag lower than your hips and bladder. This is so the pee (urine) does not go back into the tube. 4. Open the pour spout. It is at the bottom of the bag. 5. Empty the  pee into the toilet or pail. Do not let the pour spout touch any surface. 6. Put rubbing alcohol on a gauze pad or cotton ball. 7. Use the gauze pad or cotton ball to clean the pour spout. 8. Close the pour spout. 9. Attach the bag to your leg with tape or a leg strap. 10. Wash your hands with soap and water. Follow instructions for cleaning the drainage bag:  From the product maker.  As told by your doctor. How to change the bag Supplies needed  Alcohol wipes.  A clean bag.  Tape or a leg strap. Changing the bag Replace your bag when it starts to leak, smell bad, or look dirty. 1. Wash your hands with soap and water. 2. Separate the dirty bag from your leg. 3. Pinch the catheter with your fingers so that pee does not spill out. 4. Separate the catheter tube from the bag tube where these tubes connect (at the connection valve). Do not let the tubes touch any surface. 5. Clean the end of the catheter tube with an alcohol wipe. Use a different alcohol wipe to clean the end of the bag tube. 6. Connect the catheter tube to the tube of the clean bag. 7. Attach the clean bag to your leg with tape or a leg strap. Do not make the bag tight on your leg. 8. Wash your hands with soap and water. General rules   Never pull on your catheter. Never try to take it out. Doing that can hurt you.  Always wash your hands before and after you touch your catheter or bag. Use a mild, fragrance-free soap. If you do not have soap and water, use hand sanitizer.  Always make sure there are no twists or bends (kinks) in the catheter tube.  Always make sure there are no leaks in the catheter or bag.  Drink enough fluid to keep your pee pale yellow.  Do not take baths, swim, or use a hot tub.  If you are male, wipe from front to back after you poop (have a bowel movement). Contact a doctor if:  Your pee is cloudy.  Your pee smells worse than usual.  Your catheter gets clogged.  Your catheter  leaks.  Your bladder feels full. Get help right away if:  You have redness, swelling, or pain where the catheter goes into your body.  You have fluid, blood, pus, or a bad smell coming from the area where the catheter goes into your body.  Your skin feels warm where   the catheter goes into your body.  You have a fever.  You have pain in your: ? Belly (abdomen). ? Legs. ? Lower back. ? Bladder.  You see blood in the catheter.  Your pee is pink or red.  You feel sick to your stomach (nauseous).  You throw up (vomit).  You have chills.  Your pee is not draining into the bag.  Your catheter gets pulled out. Summary  An indwelling urinary catheter is a thin tube that is placed into the bladder to help drain pee (urine) out of the body.  The catheter is placed into the part of the body that drains pee from the bladder (urethra).  Taking good care of your catheter will keep it working properly and help prevent problems.  Always wash your hands before and after touching your catheter or bag.  Never pull on your catheter or try to take it out. This information is not intended to replace advice given to you by your health care provider. Make sure you discuss any questions you have with your health care provider. Document Revised: 03/23/2019 Document Reviewed: 07/15/2017 Elsevier Patient Education  [place].  Holmium Laser Enucleation of the Prostate (HoLEP)  HoLEP is a treatment for men with benign prostatic hyperplasia (BPH). The laser surgery removed blockages of urine flow, and is done without any incisions on the body.     What is HoLEP?  HoLEP is a type of laser surgery used to treat obstruction (blockage) of urine flow as a result of benign prostatic hyperplasia (BPH). In men with BPH, the prostate gland is not cancerous, but has become enlarged. An enlarged prostate can result in a number of urinary tract symptoms such as weak urinary stream, difficulty in  starting urination, inability to urinate, frequent urination, or getting up at night to urinate.  HoLEP was developed in the 1990's as a more effective and less expensive surgical option for BPH, compared to other surgical options such as laser vaporization(PVP/greenlight laser), transurethral resection of the prostate(TURP), and open simple prostatectomy.   What happens during a HoLEP?  HoLEP requires general anesthesia ("asleep" throughout the procedure).   An antibiotic is given to reduce the risk of infection  A surgical instrument called a resectoscope is inserted through the urethra (the tube that carries urine from the bladder). The resectoscope has a camera that allows the surgeon to view the internal structure of the prostate gland, and to see where the incisions are being made during surgery.  The laser is inserted into the resectoscope and is used to enucleate (free up) the enlarged prostate tissue from the capsule (outer shell) and then to seal up any blood vessels. The tissue that has been removed is pushed back into the bladder.  A morcellator is placed through the resectoscope, and is used to suction out the prostate tissue that has been pushed into the bladder.  When the prostate tissue has been removed, the resectoscope is removed, and a foley catheter is placed to allow healing and drain the urine from the bladder.     What happens after a HoLEP?  More than 90% of patients go home the same day a few hours after surgery. Less than 10% will be admitted to the hospital overnight for observation to monitor the urine, or if they have other medical problems.  Fluid is flushed through the catheter for about 1 hour after surgery to clear any blood from the urine. It is normal to have some blood  in the urine after surgery. The need for blood transfusion is extremely rare.  Eating and drinking are permitted after the procedure once the patient has fully awakened from anesthesia.   The catheter is usually removed 2-3 days after surgery- the patient will come to clinic to have the catheter removed and make sure they can urinate on their own.  It is very important to drink lots of fluids after surgery for one week to keep the bladder flushed.  At first, there may be some burning with urination, but this typically improved within a few hours to days. Most patients do not have a significant amount of pain, and narcotic pain medications are rarely needed.  Symptoms of urinary frequency, urgency, and even leakage are NORMAL for the first few weeks after surgery as the bladder adjusts after having to work hard against blockage from the prostate for many years. This will improve, but can sometimes take several months.  The use of pelvic floor exercises (Kegel exercises) can help improve problems with urinary incontinence.   After catheter removal, patients will be seen at 6 weeks and 6 months for symptom check  No heavy lifting for at least 2-3 weeks after surgery, however patients can walk and do light activities the first day after surgery. Return to work time depends on occupation.    What are the advantages of HoLEP?  HoLEP has been studied in many different parts of the world and has been shown to be a safe and effective procedure. Although there are many types of BPH surgeries available, HoLEP offers a unique advantage in being able to remove a large amount of tissue without any incisions on the body, even in very large prostates, while decreasing the risk of bleeding and providing tissue for pathology (to look for cancer). This decreases the need for blood transfusions during surgery, minimizes hospital stay, and reduces the risk of needing repeat treatment.  What are the side effects of HoLEP?  Temporary burning and bleeding during urination. Some blood may be seen in the urine for weeks after surgery and is part of the healing process.  Urinary incontinence (inability  to control urine flow) is expected in all patients immediately after surgery and they should wear pads for the first few days/weeks. This typically improves over the course of several weeks. Performing Kegel exercises can help decrease leakage from stress maneuvers such as coughing, sneezing, or lifting. The rate of long term leakage is very low. Patients may also have leakage with urgency and this may be treated with medication. The risk of urge incontinence can be dependent on several factors including age, prostate size, symptoms, and other medical problems.  Retrograde ejaculation or "backwards ejaculation." In 75% of cases, the patient will not see any fluid during ejaculation after surgery.  Erectile function is generally not significantly affected.   What are the risks of HoLEP?  Injury to the urethra or development of scar tissue at a later date  Injury to the capsule of the prostate (typically treated with longer catheterization).  Injury to the bladder or ureteral orifices (where the urine from the kidney drains out)  Infection of the bladder, testes, or kidneys  Return of urinary obstruction at a later date requiring another operation (<2%)  Need for blood transfusion or re-operation due to bleeding  Failure to relieve all symptoms and/or need for prolonged catheterization after surgery  5-15% of patients are found to have previously undiagnosed prostate cancer in their specimen. Prostate cancer can  be treated after HoLEP.  Standard risks of anesthesia including blood clots, heart attacks, etc  When should I call my doctor?  Fever over 101.3 degrees  Inability to urinate, or large blood clots in the urine    AMBULATORY SURGERY  DISCHARGE INSTRUCTIONS   1) The drugs that you were given will stay in your system until tomorrow so for the next 24 hours you should not:  A) Drive an automobile B) Make any legal decisions C) Drink any alcoholic beverage   2) You may  resume regular meals tomorrow.  Today it is better to start with liquids and gradually work up to solid foods.  You may eat anything you prefer, but it is better to start with liquids, then soup and crackers, and gradually work up to solid foods.   3) Please notify your doctor immediately if you have any unusual bleeding, trouble breathing, redness and pain at the surgery site, drainage, fever, or pain not relieved by medication.    4) Additional Instructions:        Please contact your physician with any problems or Same Day Surgery at [phone], Monday through Friday 6 am to 4 pm, or [place] at [facility] number at [phone].

## 2020-08-11 NOTE — Op Note (Signed)
Date of procedure: 08/11/20  Preoperative diagnosis:  1. BPH with BOO 2. Urinary retention 3. Left ureteral calculi  Postoperative diagnosis:  1. same   Procedure: 1. HoLEP with morcellation 2. Left ureteroscopy 3. Left ureteral stent placement  Surgeon: Hollice Espy, MD  Anesthesia: General  Complications: None  Intraoperative findings: J hooking of left distal ureter,   EBL: 100 cc  Specimens: Prostate chips  Drains: 6 x 24 French double-J ureteral stent on left  Indication: [name] is a 79 y.o. patient with incidental left distal ureteral stone, nonobstructing BPH with multiple episodes of urinary retention.  After reviewing the management options for treatment, he elected to proceed with the above surgical procedure(s). We have discussed the potential benefits and risks of the procedure, side effects of the proposed treatment, the likelihood of the patient achieving the goals of the procedure, and any potential problems that might occur during the procedure or recuperation. Informed consent has been obtained.  Description of procedure:  The patient was taken to the operating room and general anesthesia was induced.  The patient was placed in the dorsal lithotomy position, prepped and draped in the usual sterile fashion, and preoperative antibiotics were administered. A preoperative time-out was performed.   A 21 French scope was advanced per urethra into the bladder.  I attempted to cannulate the left distal ureter using first a sensor wire unsuccessfully due to the chamfering of the distal ureter.  I then attempted angled Glidewire also unsuccessfully.  They ended up advancing an semirigid ureteroscope into the distalmost aspect of the ureter where ureteral fold was encountered.  I was able to navigate beyond this blood.  I was able to however straighten the ureter slightly in order to be able to place an angled Glidewire up to the level of the kidney.  I was unable to  advance the scope to the level of the stone.  I then removed the ureteroscope and advanced a 5 [place] open-ended ureteral catheter to the level of the mid ureter.  I exchanged the Glidewire for a sensor wire and remove the open-ended.  I elected to go and just place a stent at this time and return for staged procedure after the prostate has been addressed as well as ureteral dilation from the stent.  A 6 x 24 French double-J ureteral stent was advanced over the wire up to the level of the kidney.  The wire was partially drawn till full coils noted with food into the renal pelvis as well as within the bladder.    A 26 French resectoscope sheath using a blunt angled obturator was introduced without difficulty into the bladder.  The bladder was carefully inspected and noted to be moderately trabeculated.  There is an elevated bladder neck with a very small intravesical component.  The trigone was able to be visualized with some manipulation and the UOs were a good distance bladder neck itself.  The prostatic fossa had significant trilobar coaptation with greater than 5 cm prostatic length.  A 550 m laser fiber was then brought in and using settings of 0.9 J's and 53 Hz, 2 incisions were created at the 5:00 and 7:00 positions of the bladder neck on either side of the median lobe down to the level of the bladder neck/capsular fibers.  The incision was carried down caudally meeting in the midline just above the verumontanum.  The median lobe was then enucleated from a caudal to cranial direction cleaving the adenoma off the underlying capsule  rolling it towards the bladder neck and ultimately cleaving the mucosa to free the median lobe into the bladder.   Next, a semilunar incision was created at the prostatic apex on the left side again freeing up the adenoma from the underlying capsule.  Care was taken to avoid any resection past the verumontanum.  This incision was carried around laterally and cranially towards  the bladder neck.  Ultimately, I was able to complete the anterior commissure mucosa and the adenoma into the bladder creating a widely patent prostatic fossa.     Next, the same similar incision was created at the right prostatic apex.  This adenoma however ended up being enucleated and more of a piece wise fashion freeing up a large BPH nodules from the capsular fibers.  Once this was completed and cleared from the bladder neck, the prostatic fossa was noted to be widely patent.  Hemostasis was achieved using hemostatic fiber settings.  Bilateral UOs were visualized and free of any injury with a stent emanating from the left.  Finally, the 79 French resectoscope was exchanged for nephroscope and using the Pir[place]ha handpiece morcellator, the bladder was distended in each of the prostate chips were evacuated.  The bladder was irrigated several times and smaller chips were clear for the bladder.  This point time, there were no residual fibers appreciated in the bladder.  Hemostasis was adequate.  10 mg of IV Lasix was administered to help with postoperative diuresis.  A 22 French two-way Foley catheter was then inserted over a catheter guide with 50 cc in the balloon.  The catheter irrigated easily and well.  Patient was then clean and dry, repositioned supine position, reversed from anesthesia, taken to PACU in stable condition.   Plan: Patient will return to the office in 7 for voiding trial.  We will plan to return to the operating room in 2 weeks for staged ureteroscopy procedure to treat his left distal ureteral calculi.    Hollice Espy, M.D.

## 2020-08-12 ENCOUNTER — Other Ambulatory Visit: Payer: Self-pay | Admitting: Radiology

## 2020-08-12 ENCOUNTER — Telehealth: Payer: Self-pay | Admitting: Radiology

## 2020-08-12 ENCOUNTER — Ambulatory Visit (INDEPENDENT_AMBULATORY_CARE_PROVIDER_SITE_OTHER): Payer: Medicare HMO | Admitting: Physician Assistant

## 2020-08-12 ENCOUNTER — Encounter: Payer: Self-pay | Admitting: Physician Assistant

## 2020-08-12 VITALS — BP 137/70 | HR 58 | Ht 68.0 in | Wt 145.0 lb

## 2020-08-12 DIAGNOSIS — N3289 Other specified disorders of bladder: Secondary | ICD-10-CM | POA: Diagnosis not present

## 2020-08-12 DIAGNOSIS — N201 Calculus of ureter: Secondary | ICD-10-CM

## 2020-08-12 MED ORDER — OXYBUTYNIN CHLORIDE 5 MG PO TABS: 5.0000 mg | ORAL_TABLET | Freq: Three times a day (TID) | ORAL | 1 refills | Status: DC | PRN

## 2020-08-12 NOTE — Progress Notes (Signed)
Patient presented to clinic today with reports of leakage around his Foley catheter tubing.  He is POD 1 from HOLEP and left ureteroscopy with left ureteral stent placement with Dr. [name] for management of BPH and left ureteral calculi. Urine noted to be pink-tinged with dripping urine noted from the urethral meatus. Patient reports the sensation of urinary urgency when this occurs. He reports his gross hematuria has improved since surgery and denies passage of clots.  Bladder Irrigation  Due to leaking around Foley tubing and gross hematuria patient is present today for a bladder irrigation. Patient was cleaned and prepped in a sterile fashion. 450 ml of sterile water was instilled and irrigated into the bladder with a 65ml Toomey syringe through the catheter in place.  Urine cleared from pink tinged to clear with return of <3ccs of clot material. Catheter is now draining fine.  Catheter was reattached to the night bag for drainage. Patient tolerated well.   Performed by: Debroah Loop, [name]  Additional notes/ Follow up: Starting oxybutynin 5mg  TID PRN for management of bladder spasms.

## 2020-08-12 NOTE — Telephone Encounter (Signed)
Patient reports leaking of urine from penis around Foley catheter and hematuria. States urine flows freely from catheter without clots. Advised patient to increase fluid intake.  Please advise about leakage.

## 2020-08-13 LAB — SURGICAL PATHOLOGY

## 2020-08-19 ENCOUNTER — Ambulatory Visit: Payer: Medicare HMO | Admitting: Physician Assistant

## 2020-08-19 ENCOUNTER — Other Ambulatory Visit: Payer: Self-pay

## 2020-08-19 ENCOUNTER — Ambulatory Visit (INDEPENDENT_AMBULATORY_CARE_PROVIDER_SITE_OTHER): Payer: Medicare HMO | Admitting: Physician Assistant

## 2020-08-19 VITALS — BP 136/87 | HR 65 | Ht 70.0 in | Wt 145.0 lb

## 2020-08-19 DIAGNOSIS — N201 Calculus of ureter: Secondary | ICD-10-CM | POA: Diagnosis not present

## 2020-08-19 DIAGNOSIS — N401 Enlarged prostate with lower urinary tract symptoms: Secondary | ICD-10-CM

## 2020-08-19 DIAGNOSIS — N138 Other obstructive and reflux uropathy: Secondary | ICD-10-CM | POA: Diagnosis not present

## 2020-08-19 DIAGNOSIS — N200 Calculus of kidney: Secondary | ICD-10-CM | POA: Diagnosis not present

## 2020-08-19 LAB — BLADDER SCAN AMB NON-IMAGING

## 2020-08-19 NOTE — Progress Notes (Signed)
Afternoon follow-up  Patient returned to clinic this afternoon for repeat PVR. He reports drinking approximately 32oz of fluid. He has been able to urinate. He has had urinary leakage. PVR 91mL.  Results for orders placed or performed in visit on 08/19/20  BLADDER SCAN AMB NON-IMAGING  Result Value Ref Range   Scan Result 22mL      Voiding trial passed. Counseled patient to start Kegel exercises 3x10 sets daily. Reviewed normal postoperative findings including dysuria, gross hematuria, and urinary leakage.  Patient passed a stone during the day today; he brings it with him for analysis. He was previously found to have two left ureteral stones; will proceed with URS with Dr. [name] as planned.  Follow up: Staged URS preop as below Future Appointments  Date Time Provider Arthur  08/21/2020 10:05 AM [facility] None  08/21/2020 11:00 AM [facility] None  09/23/2020  2:15 PM Hollice Espy, MD [facility] None

## 2020-08-19 NOTE — Patient Instructions (Addendum)
Stop oxybutynin. On Thursday, 9/9, go get your Covid test and come to our clinic to drop off a pre-op urine sample. On Friday, 9/10, call [phone] between 1-3pm to learn your arrival time for surgery.  Kegel Exercises  Kegel exercises can help strengthen your pelvic floor muscles. The pelvic floor is a group of muscles that support your rectum, small intestine, and bladder. In females, pelvic floor muscles also help support the womb (uterus). These muscles help you control the flow of urine and stool. Kegel exercises are painless and simple, and they do not require any equipment. Your provider may suggest Kegel exercises to:  Improve bladder and bowel control.  Improve sexual response.  Improve weak pelvic floor muscles after surgery to remove the uterus (hysterectomy) or pregnancy (females).  Improve weak pelvic floor muscles after prostate gland removal or surgery (males). Kegel exercises involve squeezing your pelvic floor muscles, which are the same muscles you squeeze when you try to stop the flow of urine or keep from passing gas. The exercises can be done while sitting, standing, or lying down, but it is best to vary your position. Exercises How to do Kegel exercises: 1. Squeeze your pelvic floor muscles tight. You should feel a tight lift in your rectal area. If you are a male, you should also feel a tightness in your vaginal area. Keep your stomach, buttocks, and legs relaxed. 2. Hold the muscles tight for up to 10 seconds. 3. Breathe normally. 4. Relax your muscles. 5. Repeat as told by your health care provider. Repeat this exercise daily as told by your health care provider. Continue to do this exercise for at least 4-6 weeks, or for as long as told by your health care provider. You may be referred to a physical therapist who can help you learn more about how to do Kegel exercises. Depending on your condition, your health care provider may recommend:  Varying how long  you squeeze your muscles.  Doing several sets of exercises every day.  Doing exercises for several weeks.  Making Kegel exercises a part of your regular exercise routine. This information is not intended to replace advice given to you by your health care provider. Make sure you discuss any questions you have with your health care provider. Document Revised: 07/19/2018 Document Reviewed: 07/19/2018 Elsevier Patient Education  [name].

## 2020-08-19 NOTE — Progress Notes (Signed)
Catheter Removal  Patient is present today for a catheter removal.  53ml of water was drained from the balloon. A 22FR foley cath was removed from the bladder no complications were noted. Patient tolerated well.  Performed by: Debroah Loop, [name]  Follow up/ Additional notes: Patient reports dry mouth on oxybutynin; counseled him to stop this med. Counseled to push fluids and RTC this afternoon for PVR.

## 2020-08-19 NOTE — Addendum Note (Signed)
Addended by: Evelina Bucy on: 08/19/2020 04:03 PM   Modules accepted: Orders

## 2020-08-19 NOTE — H&P (View-Only) (Signed)
Catheter Removal  Patient is present today for a catheter removal.  79ml of water was drained from the balloon. A 22FR foley cath was removed from the bladder no complications were noted. Patient tolerated well.  Performed by: Debroah Loop, [name]  Follow up/ Additional notes: Patient reports dry mouth on oxybutynin; counseled him to stop this med. Counseled to push fluids and RTC this afternoon for PVR.

## 2020-08-20 ENCOUNTER — Other Ambulatory Visit: Payer: Medicare HMO

## 2020-08-20 ENCOUNTER — Other Ambulatory Visit: Payer: Self-pay | Admitting: Family Medicine

## 2020-08-20 DIAGNOSIS — N401 Enlarged prostate with lower urinary tract symptoms: Secondary | ICD-10-CM

## 2020-08-20 DIAGNOSIS — N138 Other obstructive and reflux uropathy: Secondary | ICD-10-CM

## 2020-08-21 ENCOUNTER — Other Ambulatory Visit: Payer: Medicare HMO

## 2020-08-21 ENCOUNTER — Other Ambulatory Visit: Payer: Self-pay

## 2020-08-21 ENCOUNTER — Other Ambulatory Visit
Admit: 2020-08-21 | Discharge: 2020-08-21 | Disposition: A | Discharge status: Home / Self Care | Payer: Medicare HMO | Source: Ambulatory Visit | Attending: Urology | Admitting: Urology

## 2020-08-21 DIAGNOSIS — Z20822 Contact with and (suspected) exposure to covid-19: Secondary | ICD-10-CM | POA: Insufficient documentation

## 2020-08-21 DIAGNOSIS — N401 Enlarged prostate with lower urinary tract symptoms: Secondary | ICD-10-CM | POA: Diagnosis not present

## 2020-08-21 DIAGNOSIS — N138 Other obstructive and reflux uropathy: Secondary | ICD-10-CM | POA: Diagnosis not present

## 2020-08-21 DIAGNOSIS — Z01812 Encounter for preprocedural laboratory examination: Secondary | ICD-10-CM | POA: Insufficient documentation

## 2020-08-22 ENCOUNTER — Other Ambulatory Visit: Payer: Medicare HMO

## 2020-08-22 LAB — URINALYSIS, COMPLETE
Bilirubin, UA: NEGATIVE
Glucose, UA: NEGATIVE
Ketones, UA: NEGATIVE
Nitrite, UA: NEGATIVE
Specific Gravity, UA: 1.02 (ref 1.005–1.030)
Urobilinogen, Ur: 1 mg/dL (ref 0.2–1.0)
pH, UA: 6.5 (ref 5.0–7.5)

## 2020-08-22 LAB — MICROSCOPIC EXAMINATION
RBC, Urine: >30 /hpf — AB (ref 0–2)
WBC, UA: >30 /hpf — AB (ref 0–5)

## 2020-08-22 LAB — SARS CORONAVIRUS 2 (TAT 6-24 HRS): SARS Coronavirus 2: NEGATIVE

## 2020-08-23 LAB — CALCULI, WITH PHOTOGRAPH (CLINICAL LAB)
Calcium Oxalate Monohydrate: 100 %
Weight Calculi: 14 mg

## 2020-08-25 ENCOUNTER — Encounter: Payer: Self-pay | Admitting: Certified Registered"

## 2020-08-25 ENCOUNTER — Ambulatory Visit
Admission: RE | Admit: 2020-08-25 | Discharge: 2020-08-25 | Disposition: A | Discharge status: Home / Self Care | Payer: Medicare HMO | Attending: Urology | Admitting: Urology

## 2020-08-25 ENCOUNTER — Encounter
Admission: RE | Disposition: A | Discharge status: Home / Self Care | Payer: Self-pay | Source: Home / Self Care | Attending: Urology

## 2020-08-25 ENCOUNTER — Telehealth: Payer: Self-pay | Admitting: Urology

## 2020-08-25 DIAGNOSIS — N201 Calculus of ureter: Secondary | ICD-10-CM

## 2020-08-25 LAB — CULTURE, URINE COMPREHENSIVE

## 2020-08-25 SURGERY — CYSTOSCOPY/URETEROSCOPY/HOLMIUM LASER/STENT PLACEMENT
Anesthesia: General | Laterality: Left

## 2020-08-25 MED ORDER — CEFAZOLIN SODIUM-DEXTROSE 2-4 GM/100ML-% IV SOLN: 2.0000 g | INTRAVENOUS | Status: DC

## 2020-08-25 MED ORDER — CEPHALEXIN 500 MG PO CAPS: 500.0000 mg | ORAL_CAPSULE | Freq: Four times a day (QID) | ORAL | 0 refills | Status: DC

## 2020-08-25 SURGICAL SUPPLY — 26 items
BAG DRAIN CYSTO-URO LG1000N (MISCELLANEOUS) ×2 IMPLANT
BASKET ZERO TIP 1.9FR (BASKET) IMPLANT
BRUSH SCRUB EZ 1% IODOPHOR (MISCELLANEOUS) ×2 IMPLANT
CATH URETL 5X70 OPEN END (CATHETERS) ×2 IMPLANT
CNTNR SPEC 2.5X3XGRAD LEK (MISCELLANEOUS)
CONT SPEC 4OZ STER OR WHT (MISCELLANEOUS)
CONTAINER SPEC 2.5X3XGRAD LEK (MISCELLANEOUS) IMPLANT
DRAPE UTILITY 15X26 TOWEL STRL (DRAPES) ×2 IMPLANT
FIBER LASER TRACTIP 200 (UROLOGICAL SUPPLIES) ×2 IMPLANT
GLOVE BIO SURGEON STRL SZ 6.5 (GLOVE) ×2 IMPLANT
GOWN STRL REUS W/ TWL LRG LVL3 (GOWN DISPOSABLE) ×2 IMPLANT
GOWN STRL REUS W/TWL LRG LVL3 (GOWN DISPOSABLE) ×2
GUIDEWIRE GREEN .038 145CM (MISCELLANEOUS) IMPLANT
GUIDEWIRE STR DUAL SENSOR (WIRE) ×2 IMPLANT
INFUSOR MANOMETER BAG 3000ML (MISCELLANEOUS) ×2 IMPLANT
INTRODUCER DILATOR DOUBLE (INTRODUCER) IMPLANT
KIT TURNOVER CYSTO (KITS) ×2 IMPLANT
PACK CYSTO AR (MISCELLANEOUS) ×2 IMPLANT
SET CYSTO W/LG BORE CLAMP LF (SET/KITS/TRAYS/PACK) ×2 IMPLANT
SHEATH URETERAL 12FRX35CM (MISCELLANEOUS) IMPLANT
SOL .9 NS 3000ML IRR  AL (IV SOLUTION) ×1
SOL .9 NS 3000ML IRR UROMATIC (IV SOLUTION) ×1 IMPLANT
STENT URET 6FRX24 CONTOUR (STENTS) IMPLANT
STENT URET 6FRX26 CONTOUR (STENTS) IMPLANT
SURGILUBE 2OZ TUBE FLIPTOP (MISCELLANEOUS) ×2 IMPLANT
WATER STERILE IRR 1000ML POUR (IV SOLUTION) ×2 IMPLANT

## 2020-08-25 NOTE — Telephone Encounter (Signed)
Preop UCx growing GNR, not finalized.  Asymptomatic.  Will cancel procedure today, prescribe Keflex 500 mg qid x 7 days and reschedule for Thursday.    Hollice Espy, MD

## 2020-08-26 ENCOUNTER — Ambulatory Visit
Admission: RE | Admit: 2020-08-26 | Discharge: 2020-08-26 | Disposition: A | Discharge status: Home / Self Care | Payer: Medicare HMO | Source: Ambulatory Visit | Attending: Urology | Admitting: Urology

## 2020-08-26 ENCOUNTER — Other Ambulatory Visit: Payer: Self-pay

## 2020-08-26 ENCOUNTER — Encounter: Payer: Medicare HMO | Admitting: Urology

## 2020-08-26 ENCOUNTER — Ambulatory Visit: Payer: Medicare HMO | Admitting: Urology

## 2020-08-26 ENCOUNTER — Telehealth: Payer: Self-pay

## 2020-08-26 DIAGNOSIS — N201 Calculus of ureter: Secondary | ICD-10-CM

## 2020-08-26 DIAGNOSIS — S22080A Wedge compression fracture of T11-T12 vertebra, initial encounter for closed fracture: Secondary | ICD-10-CM | POA: Diagnosis not present

## 2020-08-26 DIAGNOSIS — K8020 Calculus of gallbladder without cholecystitis without obstruction: Secondary | ICD-10-CM | POA: Diagnosis not present

## 2020-08-26 DIAGNOSIS — N200 Calculus of kidney: Secondary | ICD-10-CM | POA: Diagnosis not present

## 2020-08-26 MED ORDER — CIPROFLOXACIN HCL 500 MG PO TABS: 500.0000 mg | ORAL_TABLET | Freq: Two times a day (BID) | ORAL | 0 refills | Status: DC

## 2020-08-26 NOTE — Telephone Encounter (Signed)
Keflex was prescribed for this patient but once sensitivities resulted, Dr [name] wants to change the antibiotic to cipro 500 mg bid x 7 days.

## 2020-08-26 NOTE — Telephone Encounter (Signed)
Notified patient of script sent to pharmacy. 

## 2020-08-26 NOTE — Telephone Encounter (Signed)
Pt left voicemail on triage line late yesterday evening (5:02pm) stating that he passed a large stone. Patient was crying with relief. He stated he wanted to make you aware. He stated he is taking antibiotic as prescribed.

## 2020-08-26 NOTE — Telephone Encounter (Signed)
Per Dr [name], offered for patient to go to the OR to be sure all stones have passed or a STAT noncontrast CT scan to be sure all stones have passed. If stones have not all passed, Dr [name] recommends proceeding to the OR this week. Patient prefers to get STAT CT.

## 2020-08-27 ENCOUNTER — Other Ambulatory Visit: Payer: Self-pay | Admitting: Radiology

## 2020-08-27 ENCOUNTER — Other Ambulatory Visit
Admission: RE | Admit: 2020-08-27 | Discharge: 2020-08-27 | Disposition: A | Discharge status: Home / Self Care | Payer: Medicare HMO | Source: Ambulatory Visit | Attending: Urology | Admitting: Urology

## 2020-08-27 DIAGNOSIS — Z20822 Contact with and (suspected) exposure to covid-19: Secondary | ICD-10-CM | POA: Insufficient documentation

## 2020-08-27 DIAGNOSIS — Z01812 Encounter for preprocedural laboratory examination: Secondary | ICD-10-CM | POA: Insufficient documentation

## 2020-08-27 DIAGNOSIS — N201 Calculus of ureter: Secondary | ICD-10-CM

## 2020-08-28 ENCOUNTER — Ambulatory Visit: Payer: Medicare HMO

## 2020-08-28 ENCOUNTER — Ambulatory Visit: Payer: Medicare HMO | Admitting: Certified Registered"

## 2020-08-28 ENCOUNTER — Ambulatory Visit
Admission: RE | Admit: 2020-08-28 | Discharge: 2020-08-28 | Disposition: A | Discharge status: Home / Self Care | Payer: Medicare HMO | Attending: Urology | Admitting: Urology

## 2020-08-28 ENCOUNTER — Encounter
Admission: RE | Disposition: A | Discharge status: Home / Self Care | Payer: Self-pay | Source: Home / Self Care | Attending: Urology

## 2020-08-28 ENCOUNTER — Other Ambulatory Visit: Payer: Self-pay | Admitting: Radiology

## 2020-08-28 ENCOUNTER — Other Ambulatory Visit: Payer: Self-pay

## 2020-08-28 ENCOUNTER — Encounter: Payer: Self-pay | Admitting: Urology

## 2020-08-28 DIAGNOSIS — N201 Calculus of ureter: Secondary | ICD-10-CM | POA: Diagnosis not present

## 2020-08-28 DIAGNOSIS — N401 Enlarged prostate with lower urinary tract symptoms: Secondary | ICD-10-CM | POA: Diagnosis not present

## 2020-08-28 DIAGNOSIS — N202 Calculus of kidney with calculus of ureter: Secondary | ICD-10-CM | POA: Insufficient documentation

## 2020-08-28 DIAGNOSIS — Z466 Encounter for fitting and adjustment of urinary device: Secondary | ICD-10-CM | POA: Diagnosis not present

## 2020-08-28 DIAGNOSIS — I10 Essential (primary) hypertension: Secondary | ICD-10-CM | POA: Diagnosis not present

## 2020-08-28 DIAGNOSIS — G4733 Obstructive sleep apnea (adult) (pediatric): Secondary | ICD-10-CM | POA: Diagnosis not present

## 2020-08-28 DIAGNOSIS — F418 Other specified anxiety disorders: Secondary | ICD-10-CM | POA: Diagnosis not present

## 2020-08-28 DIAGNOSIS — R339 Retention of urine, unspecified: Secondary | ICD-10-CM | POA: Diagnosis not present

## 2020-08-28 DIAGNOSIS — E785 Hyperlipidemia, unspecified: Secondary | ICD-10-CM | POA: Diagnosis not present

## 2020-08-28 DIAGNOSIS — G4730 Sleep apnea, unspecified: Secondary | ICD-10-CM | POA: Insufficient documentation

## 2020-08-28 LAB — SARS CORONAVIRUS 2 (TAT 6-24 HRS): SARS Coronavirus 2: NEGATIVE

## 2020-08-28 SURGERY — CYSTOSCOPY/URETEROSCOPY/HOLMIUM LASER/STENT PLACEMENT
Anesthesia: General

## 2020-08-28 MED ORDER — LIDOCAINE HCL (CARDIAC) PF 100 MG/5ML IV SOSY
PREFILLED_SYRINGE | INTRAVENOUS | Status: DC | PRN
  Administered 2020-08-28: 50 mg via INTRAVENOUS

## 2020-08-28 MED ORDER — FENTANYL CITRATE (PF) 100 MCG/2ML IJ SOLN
INTRAMUSCULAR | Status: AC
  Filled 2020-08-28: qty 2

## 2020-08-28 MED ORDER — DEXAMETHASONE SODIUM PHOSPHATE 10 MG/ML IJ SOLN
INTRAMUSCULAR | Status: DC | PRN
  Administered 2020-08-28: 10 mg via INTRAVENOUS

## 2020-08-28 MED ORDER — ROCURONIUM BROMIDE 10 MG/ML (PF) SYRINGE
PREFILLED_SYRINGE | INTRAVENOUS | Status: AC
  Filled 2020-08-28: qty 10

## 2020-08-28 MED ORDER — CEFAZOLIN SODIUM-DEXTROSE 2-4 GM/100ML-% IV SOLN
2.0000 g | INTRAVENOUS | Status: AC
  Administered 2020-08-28: 2 g via INTRAVENOUS

## 2020-08-28 MED ORDER — LIDOCAINE HCL (PF) 2 % IJ SOLN
INTRAMUSCULAR | Status: AC
  Filled 2020-08-28: qty 5

## 2020-08-28 MED ORDER — ORAL CARE MOUTH RINSE: 15.0000 mL | Freq: Once | OROMUCOSAL | Status: AC

## 2020-08-28 MED ORDER — FENTANYL CITRATE (PF) 100 MCG/2ML IJ SOLN
INTRAMUSCULAR | Status: DC | PRN
Start: 2020-08-28 — End: 2020-08-28
  Administered 2020-08-28: 100 ug via INTRAVENOUS

## 2020-08-28 MED ORDER — SUGAMMADEX SODIUM 500 MG/5ML IV SOLN
INTRAVENOUS | Status: DC | PRN
  Administered 2020-08-28: 200 mg via INTRAVENOUS

## 2020-08-28 MED ORDER — CHLORHEXIDINE GLUCONATE 0.12 % MT SOLN: 15.0000 mL | Freq: Once | OROMUCOSAL | Status: AC

## 2020-08-28 MED ORDER — CHLORHEXIDINE GLUCONATE 0.12 % MT SOLN
OROMUCOSAL | Status: AC
  Administered 2020-08-28: 15 mL via OROMUCOSAL
  Filled 2020-08-28: qty 15

## 2020-08-28 MED ORDER — PROPOFOL 10 MG/ML IV BOLUS
INTRAVENOUS | Status: AC
  Filled 2020-08-28: qty 20

## 2020-08-28 MED ORDER — LACTATED RINGERS IV SOLN: INTRAVENOUS | Status: DC

## 2020-08-28 MED ORDER — SEVOFLURANE IN SOLN
RESPIRATORY_TRACT | Status: AC
  Filled 2020-08-28: qty 250

## 2020-08-28 MED ORDER — ONDANSETRON HCL 4 MG/2ML IJ SOLN
INTRAMUSCULAR | Status: AC
  Filled 2020-08-28: qty 2

## 2020-08-28 MED ORDER — FENTANYL CITRATE (PF) 100 MCG/2ML IJ SOLN: 25.0000 ug | INTRAMUSCULAR | Status: DC | PRN

## 2020-08-28 MED ORDER — EPHEDRINE SULFATE 50 MG/ML IJ SOLN
INTRAMUSCULAR | Status: DC | PRN
  Administered 2020-08-28: 25 mg via INTRAVENOUS

## 2020-08-28 MED ORDER — CEFAZOLIN SODIUM-DEXTROSE 2-4 GM/100ML-% IV SOLN
INTRAVENOUS | Status: AC
  Filled 2020-08-28: qty 100

## 2020-08-28 MED ORDER — PROPOFOL 10 MG/ML IV BOLUS
INTRAVENOUS | Status: DC | PRN
  Administered 2020-08-28: 100 mg via INTRAVENOUS

## 2020-08-28 MED ORDER — DEXAMETHASONE SODIUM PHOSPHATE 10 MG/ML IJ SOLN
INTRAMUSCULAR | Status: AC
  Filled 2020-08-28: qty 1

## 2020-08-28 MED ORDER — ONDANSETRON HCL 4 MG/2ML IJ SOLN
INTRAMUSCULAR | Status: DC | PRN
  Administered 2020-08-28: 4 mg via INTRAVENOUS

## 2020-08-28 MED ORDER — ROCURONIUM BROMIDE 100 MG/10ML IV SOLN
INTRAVENOUS | Status: DC | PRN
  Administered 2020-08-28: 60 mg via INTRAVENOUS

## 2020-08-28 MED ORDER — PHENYLEPHRINE HCL (PRESSORS) 10 MG/ML IV SOLN
INTRAVENOUS | Status: DC | PRN
  Administered 2020-08-28 (×2): 200 ug via INTRAVENOUS

## 2020-08-28 MED ORDER — ONDANSETRON HCL 4 MG/2ML IJ SOLN: 4.0000 mg | Freq: Once | INTRAMUSCULAR | Status: DC | PRN

## 2020-08-28 SURGICAL SUPPLY — 33 items
BAG DRAIN CYSTO-URO LG1000N (MISCELLANEOUS) ×2 IMPLANT
BASKET ZERO TIP 1.9FR (BASKET) ×2 IMPLANT
BRUSH SCRUB EZ 1% IODOPHOR (MISCELLANEOUS) ×2 IMPLANT
BSKT STON RTRVL ZERO TP 1.9FR (BASKET) ×1
CATH URET FLEX-TIP 2 LUMEN 10F (CATHETERS) ×2 IMPLANT
CATH URETL 5X70 OPEN END (CATHETERS) ×2 IMPLANT
CNTNR SPEC 2.5X3XGRAD LEK (MISCELLANEOUS)
CONT SPEC 4OZ STER OR WHT (MISCELLANEOUS)
CONT SPEC 4OZ STRL OR WHT (MISCELLANEOUS)
CONTAINER SPEC 2.5X3XGRAD LEK (MISCELLANEOUS) IMPLANT
DRAPE UTILITY 15X26 TOWEL STRL (DRAPES) ×2 IMPLANT
DRSG TEGADERM 2X2.25 PEDS (GAUZE/BANDAGES/DRESSINGS) ×2 IMPLANT
FIBER LASER TRACTIP 200 (UROLOGICAL SUPPLIES) ×2 IMPLANT
GLOVE BIO SURGEON STRL SZ 6.5 (GLOVE) ×2 IMPLANT
GOWN STRL REUS W/ TWL LRG LVL3 (GOWN DISPOSABLE) ×2 IMPLANT
GOWN STRL REUS W/TWL LRG LVL3 (GOWN DISPOSABLE) ×4
GUIDEWIRE GREEN .038 145CM (MISCELLANEOUS) ×2 IMPLANT
GUIDEWIRE STR DUAL SENSOR (WIRE) ×2 IMPLANT
GUIDEWIRE SUPER STIFF (WIRE) ×2 IMPLANT
INFUSOR MANOMETER BAG 3000ML (MISCELLANEOUS) ×2 IMPLANT
INTRODUCER DILATOR DOUBLE (INTRODUCER) IMPLANT
KIT TURNOVER CYSTO (KITS) ×2 IMPLANT
PACK CYSTO AR (MISCELLANEOUS) ×2 IMPLANT
SET CYSTO W/LG BORE CLAMP LF (SET/KITS/TRAYS/PACK) ×2 IMPLANT
SHEATH URETERAL 12FR 45CM (SHEATH) ×2 IMPLANT
SHEATH URETERAL 12FRX35CM (MISCELLANEOUS) IMPLANT
SOL .9 NS 3000ML IRR  AL (IV SOLUTION) ×2
SOL .9 NS 3000ML IRR AL (IV SOLUTION) ×1
SOL .9 NS 3000ML IRR UROMATIC (IV SOLUTION) ×1 IMPLANT
STENT URET 6FRX24 CONTOUR (STENTS) ×2 IMPLANT
STENT URET 6FRX26 CONTOUR (STENTS) IMPLANT
SURGILUBE 2OZ TUBE FLIPTOP (MISCELLANEOUS) ×2 IMPLANT
WATER STERILE IRR 1000ML POUR (IV SOLUTION) ×2 IMPLANT

## 2020-08-28 NOTE — Interval H&P Note (Signed)
History and Physical Interval Note:  08/28/2020 11:51 AM  [name]  has presented today for surgery, with the diagnosis of left ureteral stone.  The various methods of treatment have been discussed with the patient and family. After consideration of risks, benefits and other options for treatment, the patient has consented to  Procedure(s): CYSTOSCOPY/URETEROSCOPY/HOLMIUM LASER/STENT exchange (N/A) as a surgical intervention.  The patient's history has been reviewed, patient examined, no change in status, stable for surgery.  I have reviewed the patient's chart and labs.  Questions were answered to the patient's satisfaction.    RRR CTAB  Left sided procedure  Currently on culture specific abx   Will also treat nonobstructing left sided stones   Hollice Espy

## 2020-08-28 NOTE — Discharge Instructions (Addendum)
AMBULATORY SURGERY  DISCHARGE INSTRUCTIONS   1) The drugs that you were given will stay in your system until tomorrow so for the next 24 hours you should not:  A) Drive an automobile B) Make any legal decisions C) Drink any alcoholic beverage   2) You may resume regular meals tomorrow.  Today it is better to start with liquids and gradually work up to solid foods.  You may eat anything you prefer, but it is better to start with liquids, then soup and crackers, and gradually work up to solid foods.   3) Please notify your doctor immediately if you have any unusual bleeding, trouble breathing, redness and pain at the surgery site, drainage, fever, or pain not relieved by medication.    4) Additional Instructions:        Please contact your physician with any problems or Same Day Surgery at [phone], Monday through Friday 6 am to 4 pm, or [place] [facility] number at [phone].You have a ureteral stent in place.  This is a tube that extends from your kidney to your bladder.  This may cause urinary bleeding, burning with urination, and urinary frequency.  Please call our office or present to the ED if you develop fevers >101 or pain which is not able to be controlled with oral pain medications.  You may be given either Flomax and/ or ditropan to help with bladder spasms and stent pain in addition to pain medications.     You have a ureteral stent.  It is on a string taped to your penis.  On Monday morning, you may untape and remove the stent in entirety.  If you have any difficulty, call our office.  [facility] [address] [place], [place] [address] [phone]

## 2020-08-28 NOTE — Anesthesia Procedure Notes (Signed)
Procedure Name: Intubation Performed by: Gaynelle Cage, CRNA Pre-anesthesia Checklist: Patient identified, Emergency Drugs available, Suction available and Patient being monitored Patient Re-evaluated:Patient Re-evaluated prior to induction Oxygen Delivery Method: Circle system utilized Preoxygenation: Pre-oxygenation with 100% oxygen Induction Type: IV induction Ventilation: Mask ventilation without difficulty and Oral airway inserted - appropriate to patient size Laryngoscope Size: Mac and 3 Grade View: Grade IV Tube type: Oral Tube size: 7.5 mm Number of attempts: 1 Airway Equipment and Method: Oral airway Placement Confirmation: ETT inserted through vocal cords under direct vision,  positive ETCO2 and breath sounds checked- equal and bilateral Secured at: 22 cm Tube secured with: Tape Dental Injury: Teeth and Oropharynx as per pre-operative assessment  Comments: Would recommend video laryngoscopy next time as only epiglottis was seen

## 2020-08-28 NOTE — Anesthesia Preprocedure Evaluation (Signed)
Anesthesia Evaluation  Patient identified by MRN, date of birth, ID band Patient awake    Reviewed: Allergy & Precautions, H&P , NPO status , Patient's Chart, lab work & pertinent test results, reviewed documented beta blocker date and time   Airway Mallampati: II  TM Distance: >3 FB Neck ROM: full    Dental  (+) Teeth Intact   Pulmonary sleep apnea and Continuous Positive Airway Pressure Ventilation ,    Pulmonary exam normal        Cardiovascular Exercise Tolerance: Good hypertension, On Medications Normal cardiovascular exam Rate:Normal     Neuro/Psych Anxiety Depression negative neurological ROS  negative psych ROS   GI/Hepatic negative GI ROS, Neg liver ROS,   Endo/Other  negative endocrine ROS  Renal/GU Renal disease  negative genitourinary   Musculoskeletal   Abdominal   Peds  Hematology  (+) Blood dyscrasia, anemia ,   Anesthesia Other Findings   Reproductive/Obstetrics negative OB ROS                             Anesthesia Physical Anesthesia Plan  ASA: III  Anesthesia Plan: General LMA   Post-op Pain Management:    Induction:   PONV Risk Score and Plan:   Airway Management Planned:   Additional Equipment:   Intra-op Plan:   Post-operative Plan:   Informed Consent: I have reviewed the patients History and Physical, chart, labs and discussed the procedure including the risks, benefits and alternatives for the proposed anesthesia with the patient or authorized representative who has indicated his/her understanding and acceptance.       Plan Discussed with: CRNA  Anesthesia Plan Comments:         Anesthesia Quick Evaluation

## 2020-08-28 NOTE — Op Note (Signed)
Date of procedure: 08/28/20  Preoperative diagnosis:  1. Left distal ureteral calculus 2. Left nonobstructing renal calculi  Postoperative diagnosis:  1. Same as above  Procedure: 1. Left ureteroscopy for management of distal ureteral calculus 2. Left ureteroscopy for management of upper tract nonobstructing renal calculi 3. Laser lithotripsy 4. Basket extraction of stone fragment 5. Retrograde pyelogram 6. Ureteral stent exchange on left 7. Interpretation of fluoroscopy less than 30 minutes  Surgeon: Hollice Espy, MD  Anesthesia: General  Complications: None  Intraoperative findings: 5 mm left distal ureteral calculus identified, fragmented and and all pieces extracted via basket.  Upper tract ureteroscopy also accomplished from multiple nonobstructing renal calculi measuring up to 5 mm.  EBL: Minimal  Specimens: None  Drains: 6 x 24 French double-J ureteral stent on left, tether left in place taped to glans  Indication: [name] is a 79 y.o. patient with BPH and more recently urinary retention also with nonobstructing renal calculi as well as nonobstructing left distal ureteral calculi.  During previous holep, I attempted to access the left ureter but were unsuccessful due to the presence of a large prostate.  A stent was placed and he returns today for staged procedure.  He is elected to treat both his distal ureteral calculi as well as upper tract stones today.  He has been treated appropriately with preoperative antibiotics..  After reviewing the management options for treatment, he elected to proceed with the above surgical procedure(s). We have discussed the potential benefits and risks of the procedure, side effects of the proposed treatment, the likelihood of the patient achieving the goals of the procedure, and any potential problems that might occur during the procedure or recuperation. Informed consent has been obtained.  Description of procedure:  The patient was  taken to the operating room and general anesthesia was induced.  The patient was placed in the dorsal lithotomy position, prepped and draped in the usual sterile fashion, and preoperative antibiotics were administered. A preoperative time-out was performed.   50 French cystoscope was advanced per urethra into the bladder.  Notably, there are shaggy material on the surface of mucosa of the prostate with a large defect appreciated from recent holep.  There is no active bleeding noted and there is a nice large cavity here.  Advance the scope into the bladder where the stent was identified.  The distal coil the stent was grasped and brought to level the urethral meatus.  It was then cannulated using a sensor wire up to the level of the kidney which was snapped in place as a safety wire.  A semirigid ureteroscope was brought in and advanced into the distal ureter where the stone was encountered.  A 200 m laser fiber was then brought in and using the settings of 0.8 J and 10 Hz, the stone was fragmented into 3 or 4 smaller pieces.  1.9 French tipless nitinol basket was used to remove each of these fragments.  The scope was advanced into the mid ureter no additional stone was identified.  A Super Stiff was then placed under direct visualization as a working wire.  I then advanced a 12/14 French Cook ureteral access sheath under fluoroscopic guidance all the way up to the level of the proximal kidney.  The inner lumen and wire were removed excluding the safety wire.  A dual-lumen digital flexible ureteroscope was then brought in, 8 [place] in diameter advanced to the level of the renal pelvis.  There are multiple calculi almost in each  of calyx identified.  Some are small enough to be basket extracted, others required fragmentation or repositioning into additional calyces in order to access a more easily.  I was ultimately able to clear the entire collecting system of stone fragment.  Final retrograde pyelogram was  performed which showed no contrast extravasation and created a roadmap to ensure that each every calyx have been directly visualized and clear.  The scope was then backed down the length the ureter removing the access sheath along the way.  I then placed a 6 x 24 French double-J ureteral stent under fluoroscopic guidance up to the level of the kidney.  The wires partially drawn until full coils noted both within the renal pelvis as well as within the bladder.  The stent string was left affixed to the distal coil of the bladder and traversed through the meatus.  I did drain the bladder at the end of the procedure.  The stent string was secured to the patient's glans using Mastisol and Tegaderm.  He was then reversed from anesthesia, taken to the PACU in stable condition.  Plan: We will have him remove his own stent on Monday.  We will have him return in 4 weeks with renal ultrasound prior.  Complete antibiotic course.  Hollice Espy, M.D.

## 2020-08-28 NOTE — Transfer of Care (Signed)
Immediate Anesthesia Transfer of Care Note  Patient: [name]  Procedure(s) Performed: CYSTOSCOPY/URETEROSCOPY/HOLMIUM LASER/STENT exchange (N/A )  Patient Location: PACU  Anesthesia Type:General  Level of Consciousness: awake  Airway & Oxygen Therapy: Patient Spontanous Breathing and Patient connected to face mask oxygen  Post-op Assessment: Report given to RN and Post -op Vital signs reviewed and stable  Post vital signs: Reviewed and stable  Last Vitals:  Vitals Value Taken Time  BP 145/77 08/28/20 1335  Temp    Pulse 61 08/28/20 1336  Resp 10 08/28/20 1335  SpO2 100 % 08/28/20 1336  Vitals shown include unvalidated device data.  Last Pain:  Vitals:   08/28/20 1139  TempSrc: Oral  PainSc: 0-No pain         Complications: No complications documented.

## 2020-08-30 NOTE — Anesthesia Postprocedure Evaluation (Signed)
Anesthesia Post Note  Patient: [name]  Procedure(s) Performed: CYSTOSCOPY/URETEROSCOPY/HOLMIUM LASER/STENT exchange (N/A )  Patient location during evaluation: PACU Anesthesia Type: General Level of consciousness: awake and alert Pain management: pain level controlled Vital Signs Assessment: post-procedure vital signs reviewed and stable Respiratory status: spontaneous breathing, nonlabored ventilation and respiratory function stable Cardiovascular status: blood pressure returned to baseline and stable Postop Assessment: no apparent nausea or vomiting Anesthetic complications: no   No complications documented.   Last Vitals:  Vitals:   08/28/20 1450 08/28/20 1500  BP:  (!) 167/68  Pulse: 70 72  Resp: (!) 24 16  Temp:  37.1 C  SpO2: 98% 99%    Last Pain:  Vitals:   08/29/20 1120  TempSrc:   PainSc: 0-No pain                 Alphonsus Sias

## 2020-09-03 NOTE — H&P (Signed)
H&P was updated on the day of surgery, August 28, 2020. There were no changes.  He returns today for definitive management of his retained left distal ureteral calculus is seen on CT scan as well as treatment of his upper tract stones. These were unable to be accessed on the day of holep. All questions answered.  [name] 1941/08/01 [id number]  Referring provider: [name], [place],  [place] [address]    Chief Complaint  Patient presents with  . Urinary Retention    HPI: [name] is a 79 y.o. male returns today to discuss HoLEP treatment of ureteral stones. Patient has a history of elevated PSA, urge incontinence, and BPH with LUTS. Patient ws accompanied by his daughter.   Recurrent kidney stones He is s/p left URS/LL/ureteral stent placement with stent removal in 10/2014, right ESWL in 12/2014. Metabolic work up indicated a low volume of urine at 1.47 L and low urinary citrate. Urinary pH 5.56. His 24 hour urine magnesium was borderline low. No other obvious metabolic derangements.KUB 03/11/2021Cluster of calculi seen in the left hemiabdomen, likely in the left kidney approximately 7, largest in the 5-6 mm range. Renal calculi on the right is not as well seen. Potential mild increased loss of height at L1 compared to prior imaging evaluations.   CT renal stone study on 07/23/2020 revealed two calculi within the distal left ureter just above the ureterovesical junction. Larger measures 5 mm. No significant proximal hydroureteronephrosis. Multiple nonobstructing bilateral renal calculi measuring up to 5 mm.  He is asymptomatic from the stones.  BPH WITH LUTS Patient was recently seen in the ED on 07/23/2020 for abdominal pain and urinary retention. After foley removal he notes little to no urine output and had subsequently developed progressively worsening lower abdominal pain and fullness. Pain was aching, cramping. He had some  associated hematuria, passage of small clots. Over the last several hours, he had also developed bilateral flank pain. No nausea, vomiting. No known fevers. CT renal study was performed, details above.   Patient received IV Rocephin. UA showed gross hematuria, >50 RBCs, 21-50 WBCs, and many bacteria. Urine culture grew klebsiella pneumoniae.   Known prostamegaly with median lobe, 120 g prostate.  He has been offered outlet procedures in the past and is now interested in pursuing one in light of his urinary retention and failed voiding trials.  Elevated PSA Patient has had a history of s/p negative biopsy x 3. Awaiting records after numerous requests from patient and [name] office. Prostate MRI in 12/2018 revealed no radiographic evidence of high-grade prostate carcinoma. PI-RADS 1: Very Low (clinically significant cancer is highly unlikely to be present).PSAD 0.131  Most recent PSA is 17.0 as of 02/21/2020.   PSA trend: Component     Latest Ref Rng & Units 09/01/2017 03/06/2018 08/30/2018 07/20/2019             Prostate Specific Ag, Serum     0.0 - 4.0 ng/mL 16.5 (H) 15.7 (H) 21.2 (H) 15.0 (H)   Component     Latest Ref Rng & Units 01/22/2020 02/21/2020           Prostate Specific Ag, Serum     0.0 - 4.0 ng/mL 23.5 (H) 17.0 (H)     PMH:     Past Medical History:  Diagnosis Date  . Actinic keratosis   . BPH with urinary obstruction    sees urology Dr. [name]  . Carotid stenosis 07/20/2016  Minimal on [place] 07/2016. Monitor clinically.  . Depression with anxiety   . Elevated PSA 2014   sees urology - normal biopsies x 3  . HLD (hyperlipidemia)   . Left kidney mass 09/2014   on CT scan thought consistent with simple cyst on prior US  . Nasal obstruction    congenital R sided  . Obstructive pyelonephritis 09/2014   s/p hospitalization and treatment with levaquin  . OSA (obstructive sleep apnea)    pt denies  . Osteoporosis 06/04/2018   DEXA T  score -37 at L spine  . Recurrent kidney stones 2014   extensive bilateral nephrolithiasis, sees urology [name]), hydrocodone prn  . Squamous cell carcinoma of skin 07/25/2019   crown scalp  . Squamous cell carcinoma of skin 06/29/2017   SCC IS ant to crown post  . Symptomatic PVCs    per prior cardiologist, on beta blocker  . Urge incontinence    thought overactive bladder    Surgical History:      Past Surgical History:  Procedure Laterality Date  . CARDIAC CATHETERIZATION  03/2012   EF 45-50%, no LVH, + impaired LV relaxation, no PH, no valve abnormalities  . CARDIOVASCULAR STRESS TEST  03/2012   mild peri infarct ischemia in apical segment, decreased EF  . CATARACT EXTRACTION Right 01/2013  . COLONOSCOPY  02/2008   mod diverticulosis o/w WNL (2009)  . CYSTOSCOPY    . CYSTOSCOPY  10/2014   s/p stone removal, with stent removal [name])  . TONSILECTOMY, ADENOIDECTOMY, BILATERAL MYRINGOTOMY AND TUBES     removed as a child  . US ECHOCARDIOGRAPHY  03/2012   hypokinetic anterior wall, EF 45-50%, impaired relaxation pattern, mild LA dilation, mild-mod MR, mild-mod AR    Home Medications:  Allergies as of 07/29/2020   No Known Allergies        Medication List       Accurate as of July 29, 2020 10:20 AM. If you have any questions, ask your nurse or doctor.        cephALEXin 500 MG capsule Commonly known as: KEFLEX Take 1 capsule (500 mg total) by mouth 4 (four) times daily for 10 days.   finasteride 5 MG tablet Commonly known as: Proscar Take 1 tablet (5 mg total) by mouth daily.   fluconazole 200 MG tablet Commonly known as: DIFLUCAN Take 1 tablet (200 mg total) by mouth once a week. Take 1 pill a week   metoprolol succinate 25 MG 24 hr tablet Commonly known as: TOPROL-XL TAKE 1/2 TABLET EVERY DAY   tamsulosin 0.4 MG Caps capsule Commonly known as: FLOMAX Take 1 capsule (0.4 mg total) by mouth daily.        Allergies: No Known Allergies  Family History:      Family History  Problem Relation Age of Onset  . Urolithiasis Daughter   . Kidney Stones Mother   . Diabetes Neg Hx   . Cancer Neg Hx   . CAD Neg Hx   . Stroke Neg Hx   . Hypertension Neg Hx   . Prostate cancer Neg Hx   . Kidney cancer Neg Hx   . Bladder Cancer Neg Hx     Social History:  reports that he has never smoked. He has never used smokeless tobacco. He reports that he does not drink alcohol and does not use drugs.   Physical Exam: BP (!) 153/54   Pulse 62   Constitutional:  Alert and oriented, No acute distress.  Accompanied by his daughter today.  In wheelchair. HEENT: [place] AT, moist mucus membranes.  Trachea midline, no masses. Cardiovascular: No clubbing, cyanosis, or edema. Respiratory: Normal respiratory effort, no increased work of breathing. Skin: No rashes, bruises or suspicious lesions. Neurologic: Grossly intact, no focal deficits, moving all 4 extremities. Psychiatric: Normal mood and affect.  Laboratory Data:  Recent Labs       Lab Results  Component Value Date   CREATININE 1.33 (H) 07/23/2020      Pertinent Imaging:  CT Renal Stone Study  Narrative CLINICAL DATA:  Bilateral flank pain and hematuria  EXAM: CT ABDOMEN AND PELVIS WITHOUT CONTRAST  TECHNIQUE: Multidetector CT imaging of the abdomen and pelvis was performed following the standard protocol without IV contrast.  COMPARISON:  2015  FINDINGS: Lower chest: No acute abnormality.  Hepatobiliary: Too small to characterize hypoattenuating lesion of the liver. Layering gallbladder sludge and possible small gallstones. No biliary dilatation.  Pancreas: Unremarkable.  Spleen: Unremarkable.  Adrenals/Urinary Tract: Adrenals are unremarkable. Exophytic left renal cyst. Multiple nonobstructing renal calculi bilaterally. Largest measures 5 mm. There are 2 calculi within the distal  left ureter just above the ureterovesical junction. The more proximal measures 2 mm and the more distal measures 5 mm. No significant hydroureteronephrosis. Bladder is decompressed by Foley catheter.  Stomach/Bowel: Moderate hiatal hernia. Bowel is normal in caliber. There is sigmoid diverticulosis.  Vascular/Lymphatic: Diffuse aortic atherosclerosis. There are no enlarged lymph nodes identified.  Reproductive: Similar appearance of marked prostate enlargement.  Other: No ascites.  No acute abnormality of the abdominal wall.  Musculoskeletal: There are chronic appearing compression deformities T11, T12, L1, and L4.  IMPRESSION: Two calculi within the distal left ureter just above the ureterovesical junction. Larger measures 5 mm. No significant proximal hydroureteronephrosis.  Multiple nonobstructing bilateral renal calculi measuring up to 5 mm.  Additional chronic findings detailed above.   Electronically Signed By: Macy Mis M.D. On: 07/23/2020 15:03  I have personally reviewed the images and agree with radiologist interpretation.    Assessment & Plan:    1. Elevated PSA Most recent PSA is 17.0 as of 02/21/2020.  Chronic, prostate MRI has been reassuring  2. Recurrent kidney stones/left ureteral stone Recommend left ureteroscopy/stent placement at the time of #2  Risks and benefits of ureteroscopy were reviewed including but not limited to infection, bleeding, pain, ureteral injury which could require open surgery versus prolonged indwelling if ureteral perforation occurs, persistent stone disease, requirement for staged procedure, possible stent, and global anesthesia risks. Patient expressed understanding and desires to proceed with ureteroscopy.   3. BPH with LUTS Refractory urinary retention, failed voiding trials in the setting of massive prostamegaly, 120 g prostate despite Flomax and finasteride.  At this point time, of highly  recommended pursuing outlet procedure.  Based on the size and confirmation of his gland, most really recommended consideration of holmium laser enucleation of the prostate.  We reviewed the surgery in detail today including the preoperative, intraoperative, and postoperative course. This will most likely be an outpatient procedure pending the degree of post op hematuria. He will go home with catheter for a few days post op and will either be taught how to remove his own catheter or return to the office for catheter removal. Risk of bleeding, infection, damage surrounding structures, injury to the bladder/ urethral, bladder neck contracture, ureteral stricture, retrograde ejaculation, stress/ urge incontinence, exacerbation of irritative voiding symptoms were all discussed in detail.  Will need cardiology clearance prior to procedure.  [facility] [address] [place], [place] [address] [phone]  I, Selena Batten, am acting as a scribe for Dr. Hollice Espy.  I have reviewed the above documentation for accuracy and completeness, and I agree with the above.   Hollice Espy, MD

## 2020-09-16 NOTE — Progress Notes (Signed)
09/17/2020 3:30 PM   [name] 14-Jul-1941 [id number]  Referring provider: [name], MD [address] [place],  [place] [address] Chief Complaint  Patient presents with  . Results    HPI: [name] is a 79 y.o. male who returns for 3 weeks post op left ureteroscopy on 08/28/2020.   Recurrent kidney stones He is s/p left URS/LL/ureteral stent placement with stent removal in 10/2014, right ESWL in 12/2014. Metabolic work up indicated a low volume of urine [name] 1.47 L and low urinary citrate. Urinary pH 5.56. His 24 hour urine magnesium was borderline low. No other obvious metabolic derangements.KUB 03/11/2021Cluster of calculi seen in the left hemiabdomen, likely in the left kidney approximately 7, largest in the 5-6 mm range. Renal calculi on the right is not as well seen. Potential mild increased loss of height [name] L1 compared to prior imaging evaluations.   CT renal stone study on 07/23/2020 revealed two calculi within the distal left ureter just above the ureterovesical junction. Larger measures 5 mm. No significant proximal hydroureteronephrosis. Multiple nonobstructing bilateral renal calculi measuring up to 5 mm.  CT renal stone study on 08/26/2020 noted a 4 mm distal left ureteral stone with double-J left ureteral stent in place. No hydronephrosis. Bilateral renal stones. Cholelithiasis. Moderate hiatal hernia. Enlarged prostate. Bladder wall thickening is indicative of an element of outlet obstruction. L4 inferior endplate compression fracture has progressed slightly from 07/23/2020.  Patient is s/p left ureteroscopy on 08/28/2020. A 5 mm left distal ureteral calculus identified, fragmented and and all pieces extracted via basket.  Upper tract ureteroscopy also accomplished from multiple nonobstructing renal calculi measuring up to 5 mm.  Renal ultrasound today shows nonobstructive stones in the right kidney.   BPH WITH LUTS Patient was recently seen in  the ED on 07/23/2020 for abdominal pain and urinary retention. After foley removal he notes little to no urine output and had subsequently developed progressively worsening lower abdominal pain and fullness. Pain was aching, cramping. He had some associated hematuria, passage of small clots. Over the last several hours, he had also developed bilateral flank pain. No nausea, vomiting. No known fevers. CT renal study was performed, details above.   Patient received IV Rocephin. UA showed gross hematuria, >50 RBCs, 21-50 WBCs, and many bacteria. Urine culture grew klebsiella pneumoniae.   Known prostamegaly with median lobe, 120 g prostate.    S/p HoLEP on 08/11/2020. Pathology noted benign prostatic tissue with nodular hyperplasia. Negative for dysplasia and malignancy.   PVR 12 mL. He reports that he is urinating on his own. He is doing pelvic floor exercises. No hematuria or dysuria  Elevated PSA Patient has had a history of s/p negative biopsy x 3. Awaiting records after numerous requests from patient and [name] office. Prostate MRI in 12/2018 revealed no radiographic evidence of high-grade prostate carcinoma. PI-RADS 1: Very Low (clinically significant cancer is highly unlikely to be present).PSAD 0.131  PSA is 17.0 as of 02/21/2020.     PMH: Past Medical History:  Diagnosis Date  . Actinic keratosis   . BPH with urinary obstruction    sees urology Dr. [name]  . Carotid stenosis 07/20/2016   Minimal on [place] 07/2016. Monitor clinically.  . Depression with anxiety   . Elevated PSA 2014   sees urology - normal biopsies x 3  . History of kidney stones   . HLD (hyperlipidemia)   . Left kidney mass 09/2014   on CT scan thought consistent with simple  cyst on prior US  . Nasal obstruction    congenital R sided  . Obstructive pyelonephritis 09/2014   s/p hospitalization and treatment with levaquin  . OSA (obstructive sleep apnea)    pt denies  . Osteoporosis 06/04/2018    DEXA T score -37 [name] L spine  . Recurrent kidney stones 2014   extensive bilateral nephrolithiasis, sees urology [name]), hydrocodone prn  . Squamous cell carcinoma of skin 07/25/2019   crown scalp  . Squamous cell carcinoma of skin 06/29/2017   SCC IS ant to crown post  . Symptomatic PVCs    per prior cardiologist, on beta blocker  . Urge incontinence    thought overactive bladder    Surgical History: Past Surgical History:  Procedure Laterality Date  . CARDIAC CATHETERIZATION  03/2012   EF 45-50%, no LVH, + impaired LV relaxation, no PH, no valve abnormalities  . CARDIOVASCULAR STRESS TEST  03/2012   mild peri infarct ischemia in apical segment, decreased EF  . CATARACT EXTRACTION Right 01/2013  . COLONOSCOPY  02/2008   mod diverticulosis o/w WNL (2009)  . CYSTOSCOPY    . CYSTOSCOPY  10/2014   s/p stone removal, with stent removal [name])  . CYSTOSCOPY/URETEROSCOPY/HOLMIUM LASER/STENT PLACEMENT Left 08/11/2020   Procedure: CYSTOSCOPY/URETEROSCOPY/STENT PLACEMENT;  Surgeon: Hollice Espy, MD;  Location: [facility] ORS;  Service: Urology;  Laterality: Left;  . CYSTOSCOPY/URETEROSCOPY/HOLMIUM LASER/STENT PLACEMENT N/A 08/28/2020   Procedure: CYSTOSCOPY/URETEROSCOPY/HOLMIUM LASER/STENT exchange;  Surgeon: Hollice Espy, MD;  Location: [facility] ORS;  Service: Urology;  Laterality: N/A;  . HOLEP-LASER ENUCLEATION OF THE PROSTATE WITH MORCELLATION N/A 08/11/2020   Procedure: HOLEP-LASER ENUCLEATION OF THE PROSTATE WITH MORCELLATION;  Surgeon: Hollice Espy, MD;  Location: [facility] OR[facility];  Service: Urology;  Laterality: N/A;  . TONSILECTOMY, ADENOIDECTOMY, BILATERAL MYRINGOTOMY AND TUBES     removed as a child  . US ECHOCARDIOGRAPHY  03/2012   hypokinetic anterior wall, EF 45-50%, impaired relaxation pattern, mild LA dilation, mild-mod MR, mild-mod AR    Home Medications:  Allergies as of 09/17/2020   No Known Allergies     Medication List       Accurate as of September 17, 2020  3:30 PM. If you  have any questions, ask your nurse or doctor.        CALCIUM 600+D3 PO Take 1 tablet by mouth daily with breakfast.   ciprofloxacin 500 MG tablet Commonly known as: CIPRO Take 1 tablet (500 mg total) by mouth every 12 (twelve) hours.   finasteride 5 MG tablet Commonly known as: Proscar Take 1 tablet (5 mg total) by mouth daily.   fluconazole 200 MG tablet Commonly known as: DIFLUCAN Take 1 tablet (200 mg total) by mouth once a week. Take 1 pill a week   HYDROcodone-acetaminophen 5-325 MG tablet Commonly known as: NORCO/VICODIN Take 1-2 tablets by mouth every 6 (six) hours as needed for moderate pain.   metoprolol succinate 25 MG 24 hr tablet Commonly known as: TOPROL-XL TAKE 1/2 TABLET EVERY DAY What changed: when to take this   multivitamin with minerals Tabs tablet Take 1 tablet by mouth daily with breakfast.   tamsulosin 0.4 MG Caps capsule Commonly known as: FLOMAX Take 1 capsule (0.4 mg total) by mouth daily.       Allergies: No Known Allergies  Family History: Family History  Problem Relation Age of Onset  . Urolithiasis Daughter   . Kidney Stones Mother   . Diabetes Neg Hx   . Cancer Neg Hx   . CAD Neg  Hx   . Stroke Neg Hx   . Hypertension Neg Hx   . Prostate cancer Neg Hx   . Kidney cancer Neg Hx   . Bladder Cancer Neg Hx     Social History:  reports that he has never smoked. He has never used smokeless tobacco. He reports that he does not drink alcohol and does not use drugs.   Physical Exam: BP (!) 152/69 (BP Location: Left Arm, Patient Position: Sitting, Cuff Size: Normal)   Pulse 79   Ht 5\' 6"  (1.676 m)   Wt 143 lb (64.9 kg)   BMI 23.08 kg/m   Constitutional:  Alert and oriented, No acute distress. HEENT: [name], moist mucus membranes.  Trachea midline, no masses. Cardiovascular: No clubbing, cyanosis, or edema. Respiratory: Normal respiratory effort, no increased work of breathing. Skin: No rashes, bruises or suspicious  lesions. Neurologic: Grossly intact, no focal deficits, moving all 4 extremities. Psychiatric: Normal mood and affect.  Laboratory Data:  Lab Results  Component Value Date   CREATININE 1.33 (H) 07/23/2020     Pertinent Imaging: Results for orders placed or performed in visit on 09/17/20  Bladder Scan (Post Void Residual) in office  Result Value Ref Range   Scan Result 67mL    Results for orders placed during the hospital encounter of 08/26/20  CT RENAL STONE STUDY  Narrative CLINICAL DATA:  Kidney stone passage ester day.  Left renal stent.  EXAM: CT ABDOMEN AND PELVIS WITHOUT CONTRAST  TECHNIQUE: Multidetector CT imaging of the abdomen and pelvis was performed following the standard protocol without IV contrast.  COMPARISON:  07/23/2020 and 10/07/2014.  FINDINGS: Lower chest: Millimetric peripheral left lower lobe nodule (3/10), unchanged from 10/07/2014 and benign. Heart size normal. Atherosclerotic calcification of the aorta, aortic valve and coronary arteries. No pericardial or pleural effusion. Moderate hiatal hernia.  Hepatobiliary: Low-attenuation lesions in the liver measure up to 1.4 cm, are unchanged and likely due to benign cysts in the absence of known malignancy. Stones fill the gallbladder. No biliary ductal dilatation.  Pancreas: Negative.  Spleen: Negative.  Adrenals/Urinary Tract: Adrenal glands are unremarkable. Stones are seen in the kidneys bilaterally. Minimally hyperdense 2.6 cm lesion off the interpolar left kidney is unchanged in size from 10/07/2014, supportive of a benign lesion. Right ureter is decompressed. Double-J left ureteral stent is in place with the proximal loop formed in the left renal pelvis and distal loop formed in the bladder. There is a 4 mm stone in the distal left ureter. No hydronephrosis. Bladder is low in volume and contains locules of air, which are presumably iatrogenic in etiology. Slight bladder wall  thickening.  Stomach/Bowel: Moderate hiatal hernia. Stomach, small bowel and colon are unremarkable. Appendix is not readily visualized.  Vascular/Lymphatic: Atherosclerotic calcification of the aorta without aneurysm. No pathologically enlarged lymph nodes.  Reproductive: Prostate is enlarged and indents the bladder.  Other: Mesenteries and peritoneum are unremarkable.  No free fluid.  Musculoskeletal: Degenerative changes in the spine. No worrisome lytic or sclerotic lesions. T11 and T12 compression fractures are old. L1 compression fracture is unchanged from 07/23/2020 but age indeterminate. Compression of the inferior endplate of L4 has progressed slightly from 07/23/2020. No worrisome lytic or sclerotic lesions.  IMPRESSION: 1. 4 mm distal left ureteral stone with double-J left ureteral stent in place. No hydronephrosis. 2. Bilateral renal stones. 3. Cholelithiasis. 4. Moderate hiatal hernia. 5. Enlarged prostate. Bladder wall thickening is indicative of an element of outlet obstruction. 6. L4 inferior endplate compression  fracture has progressed slightly from 07/23/2020. 7. Aortic atherosclerosis (ICD10-I70.0). Coronary artery calcification.   Electronically Signed By: Lorin Picket M.D. On: 08/27/2020 09:19  I have personally reviewed the images and agree with radiologist interpretation.    Assessment & Plan:    1. Recurrent kidney stones Patient is s/p left ureteroscopy on 08/28/2020. Stent subsequently removed, doing well Follow-up renal ultrasound without hydronephrosis Reviewed stone diet, stone prevention techniques Residual upper tract stones, continue to follow  2. BPH with LUTS Adequate emptying. Stable non-bothersome lower urinary tract symptoms.  Recommend pelvic floor exercises for mild stress incontinence, should continue to improve with time I discussed referring him to PT for floor exercises. Patient declined [name] this time.  RTC in 6 months  with [name], [name] with IPSS/PVR.   3. Elevated PSA PSA is 17.0 as of 02/21/2020.  Would recommend rechecking down the road for new baseline   [facility] [address], [place], [place] [address] [phone]  I, Selena Batten, am acting as a scribe for Dr. Hollice Espy.  I have reviewed the above documentation for accuracy and completeness, and I agree with the above.   Hollice Espy, MD

## 2020-09-17 ENCOUNTER — Other Ambulatory Visit: Payer: Self-pay

## 2020-09-17 ENCOUNTER — Ambulatory Visit (INDEPENDENT_AMBULATORY_CARE_PROVIDER_SITE_OTHER): Payer: Medicare HMO | Admitting: Urology

## 2020-09-17 ENCOUNTER — Encounter: Payer: Self-pay | Admitting: Urology

## 2020-09-17 ENCOUNTER — Ambulatory Visit
Admission: RE | Admit: 2020-09-17 | Discharge: 2020-09-17 | Disposition: A | Discharge status: Home / Self Care | Payer: Medicare HMO | Source: Ambulatory Visit | Attending: Urology | Admitting: Urology

## 2020-09-17 VITALS — BP 152/69 | HR 79 | Ht 66.0 in | Wt 143.0 lb

## 2020-09-17 DIAGNOSIS — N201 Calculus of ureter: Secondary | ICD-10-CM | POA: Diagnosis not present

## 2020-09-17 DIAGNOSIS — N200 Calculus of kidney: Secondary | ICD-10-CM | POA: Diagnosis not present

## 2020-09-17 LAB — BLADDER SCAN AMB NON-IMAGING

## 2020-09-23 ENCOUNTER — Ambulatory Visit: Payer: Medicare HMO | Admitting: Urology

## 2020-09-25 ENCOUNTER — Ambulatory Visit: Payer: Medicare HMO | Admitting: Dermatology

## 2020-09-25 ENCOUNTER — Other Ambulatory Visit: Payer: Self-pay

## 2020-09-25 DIAGNOSIS — H61001 Unspecified perichondritis of right external ear: Secondary | ICD-10-CM | POA: Diagnosis not present

## 2020-09-25 DIAGNOSIS — L570 Actinic keratosis: Secondary | ICD-10-CM

## 2020-09-25 DIAGNOSIS — B351 Tinea unguium: Secondary | ICD-10-CM | POA: Diagnosis not present

## 2020-09-25 DIAGNOSIS — L578 Other skin changes due to chronic exposure to nonionizing radiation: Secondary | ICD-10-CM | POA: Diagnosis not present

## 2020-09-25 NOTE — Progress Notes (Signed)
   Follow-Up Visit   Subjective  [name] is a 79 y.o. male who presents for the following: Actinic Keratosis (scalp, face, and ears - recheck for any persistent or new skin lesions) and nail dystrophy (R 2nd toenail - patient used the Diflucan 200mg  po QW x 5 months and Ciclopirox solution QHS).  The following portions of the chart were reviewed this encounter and updated as appropriate:  Tobacco  Allergies  Meds  Problems  Med Hx  Surg Hx  Fam Hx     Review of Systems:  No other skin or systemic complaints except as noted in HPI or Assessment and Plan.  Objective  Well appearing patient in no apparent distress; mood and affect are within normal limits.  A focused examination was performed including the face, scalp, R foot and toenails. Relevant physical exam findings are noted in the Assessment and Plan.  Objective  Face, scalp, and ears (7): Erythematous thin papules/macules with gritty scale.   Objective  R 2nd toe: Toenail dystrophy   Images      Objective  R ear mid helix: Erythema.   Assessment & Plan  AK (actinic keratosis) (7) Face, scalp, and ears  Destruction of lesion - Face, scalp, and ears Complexity: simple   Destruction method: cryotherapy   Informed consent: discussed and consent obtained   Timeout:  patient name, date of birth, surgical site, and procedure verified Lesion destroyed using liquid nitrogen: Yes   Region frozen until ice ball extended beyond lesion: Yes   Outcome: patient tolerated procedure well with no complications   Post-procedure details: wound care instructions given    Tinea unguium versus traumatic toenail dystrophy.  Because of thickness and poor response to treatment, discussed removal and patient would like to pursue this. R 2nd toe No improvement with oral Diflucan 200mg  po QW x 6 months and Ciclopirox solution. Discussed toenail removal. Patient is interested in scheduling that today.   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    Actinic Damage - diffuse scaly erythematous macules with underlying dyspigmentation - Recommend daily broad spectrum sunscreen SPF 30+ to sun-exposed areas, reapply every 2 hours as needed.  - Call for new or changing lesions.  Return for surgery - toenail removal .  I, [name], CMA, am acting as scribe for [name], MD .  Documentation: I have reviewed the above documentation for accuracy and completeness, and I agree with the above.  [name], MD

## 2020-09-25 NOTE — Patient Instructions (Signed)
   Pre-Operative Instructions  You are scheduled for a surgical procedure at [facility]. We recommend you read the following instructions. If you have any questions or concerns, please call the office at [phone].  1. Shower and wash the entire body with soap and water the day of your surgery paying special attention to cleansing at and around the planned surgery site.  2. Avoid aspirin or aspirin containing products at least fourteen (14) days prior to your surgical procedure and for at least one week (7 Days) after your surgical procedure. If you take aspirin on a regular basis for heart disease or history of stroke or for any other reason, we may recommend you continue taking aspirin but please notify us if you take this on a regular basis. Aspirin can cause more bleeding to occur during surgery as well as prolonged bleeding and bruising after surgery.   3. Avoid other nonsteroidal pain medications at least one week prior to surgery and at least one week prior to your surgery. These include medications such as Ibuprofen (Motrin, Advil and Nuprin), Naprosyn, Voltaren, Relafen, etc. If medications are used for therapeutic reasons, please inform us as they can cause increased bleeding or prolonged bleeding during and bruising after surgical procedures.   4. Please advise us if you are taking any "blood thinner" medications such as Coumadin or Dipyridamole or Plavix or similar medications. These cause increased bleeding and prolonged bleeding during procedures and bruising after surgical procedures. We may have to consider discontinuing these medications briefly prior to and shortly after your surgery if safe to do so.   5. Please inform us of all medications you are currently taking. All medications that are taken regularly should be taken the day of surgery as you always do. Nevertheless, we need to be informed of what medications you are taking prior to surgery to know whether they will  affect the procedure or cause any complications.   6. Please inform us of any medication allergies. Also inform us of whether you have allergies to Latex or rubber products or whether you have had any adverse reaction to Lidocaine or Epinephrine.  7. Please inform us of any prosthetic or artificial body parts such as artificial heart valve, joint replacements, etc., or similar condition that might require preoperative antibiotics.   8. We recommend avoidance of alcohol at least two weeks prior to surgery and continued avoidance for at least two weeks after surgery.   9. We recommend discontinuation of tobacco smoking at least two weeks prior to surgery and continued abstinence for at least two weeks after surgery.  10. Do not plan strenuous exercise, strenuous work or strenuous lifting for approximately four weeks after your surgery.   11. We request if you are unable to make your scheduled surgical appointment, please call us at least a week in advance or as soon as you are aware of a problem so that we can cancel or reschedule the appointment.   12. You MAY TAKE TYLENOL (acetaminophen) for pain as it is not a blood thinner.   13. PLEASE PLAN TO BE IN TOWN FOR TWO WEEKS FOLLOWING SURGERY, THIS IS IMPORTANT SO YOU CAN BE CHECKED FOR DRESSING CHANGES, SUTURE REMOVAL AND TO MONITOR FOR POSSIBLE COMPLICATIONS.   

## 2020-09-26 ENCOUNTER — Encounter: Payer: Self-pay | Admitting: Dermatology

## 2020-10-03 ENCOUNTER — Ambulatory Visit
Admission: EM | Admit: 2020-10-03 | Discharge: 2020-10-03 | Discharge status: Against Medical Advice | Payer: Medicare HMO

## 2020-11-18 ENCOUNTER — Observation Stay: Payer: Medicare HMO | Admitting: Anesthesiology

## 2020-11-18 ENCOUNTER — Encounter: Payer: Self-pay | Admitting: Urology

## 2020-11-18 ENCOUNTER — Ambulatory Visit (INDEPENDENT_AMBULATORY_CARE_PROVIDER_SITE_OTHER): Payer: Medicare HMO | Admitting: Urology

## 2020-11-18 ENCOUNTER — Other Ambulatory Visit: Payer: Self-pay | Admitting: Family Medicine

## 2020-11-18 ENCOUNTER — Inpatient Hospital Stay
Admission: EM | Admit: 2020-11-18 | Discharge: 2020-11-22 | DRG: 853 | Disposition: A | Discharge status: Home / Self Care | Payer: Medicare HMO | Attending: Internal Medicine | Admitting: Internal Medicine

## 2020-11-18 ENCOUNTER — Emergency Department: Payer: Medicare HMO

## 2020-11-18 ENCOUNTER — Telehealth: Payer: Self-pay | Admitting: Urology

## 2020-11-18 ENCOUNTER — Other Ambulatory Visit: Payer: Self-pay

## 2020-11-18 ENCOUNTER — Encounter
Admission: EM | Disposition: A | Discharge status: Home / Self Care | Payer: Self-pay | Source: Home / Self Care | Attending: Internal Medicine

## 2020-11-18 ENCOUNTER — Encounter: Payer: Medicare HMO | Admitting: Dermatology

## 2020-11-18 ENCOUNTER — Ambulatory Visit
Admission: RE | Admit: 2020-11-18 | Discharge: 2020-11-18 | Disposition: A | Discharge status: Home / Self Care | Payer: Medicare HMO | Source: Ambulatory Visit | Attending: Urology | Admitting: Urology

## 2020-11-18 ENCOUNTER — Ambulatory Visit
Admission: RE | Admit: 2020-11-18 | Discharge: 2020-11-18 | Disposition: A | Discharge status: Home / Self Care | Payer: Medicare HMO | Source: Home / Self Care | Attending: Urology | Admitting: Urology

## 2020-11-18 ENCOUNTER — Observation Stay: Payer: Medicare HMO

## 2020-11-18 VITALS — BP 77/52 | HR 52 | Temp 97.4°F | Ht 66.0 in | Wt 148.0 lb

## 2020-11-18 DIAGNOSIS — R6520 Severe sepsis without septic shock: Secondary | ICD-10-CM | POA: Diagnosis not present

## 2020-11-18 DIAGNOSIS — E785 Hyperlipidemia, unspecified: Secondary | ICD-10-CM | POA: Diagnosis present

## 2020-11-18 DIAGNOSIS — N201 Calculus of ureter: Secondary | ICD-10-CM | POA: Diagnosis not present

## 2020-11-18 DIAGNOSIS — F32A Depression, unspecified: Secondary | ICD-10-CM

## 2020-11-18 DIAGNOSIS — Z23 Encounter for immunization: Secondary | ICD-10-CM | POA: Diagnosis present

## 2020-11-18 DIAGNOSIS — J150 Pneumonia due to Klebsiella pneumoniae: Secondary | ICD-10-CM | POA: Diagnosis present

## 2020-11-18 DIAGNOSIS — N401 Enlarged prostate with lower urinary tract symptoms: Secondary | ICD-10-CM | POA: Diagnosis present

## 2020-11-18 DIAGNOSIS — F418 Other specified anxiety disorders: Secondary | ICD-10-CM | POA: Diagnosis not present

## 2020-11-18 DIAGNOSIS — R7881 Bacteremia: Secondary | ICD-10-CM | POA: Diagnosis not present

## 2020-11-18 DIAGNOSIS — I10 Essential (primary) hypertension: Secondary | ICD-10-CM | POA: Diagnosis not present

## 2020-11-18 DIAGNOSIS — I129 Hypertensive chronic kidney disease with stage 1 through stage 4 chronic kidney disease, or unspecified chronic kidney disease: Secondary | ICD-10-CM | POA: Diagnosis present

## 2020-11-18 DIAGNOSIS — G6281 Critical illness polyneuropathy: Secondary | ICD-10-CM

## 2020-11-18 DIAGNOSIS — I708 Atherosclerosis of other arteries: Secondary | ICD-10-CM | POA: Diagnosis present

## 2020-11-18 DIAGNOSIS — R6521 Severe sepsis with septic shock: Secondary | ICD-10-CM | POA: Diagnosis present

## 2020-11-18 DIAGNOSIS — E782 Mixed hyperlipidemia: Secondary | ICD-10-CM

## 2020-11-18 DIAGNOSIS — R5383 Other fatigue: Secondary | ICD-10-CM

## 2020-11-18 DIAGNOSIS — Z9841 Cataract extraction status, right eye: Secondary | ICD-10-CM | POA: Diagnosis not present

## 2020-11-18 DIAGNOSIS — I959 Hypotension, unspecified: Secondary | ICD-10-CM | POA: Diagnosis not present

## 2020-11-18 DIAGNOSIS — N138 Other obstructive and reflux uropathy: Secondary | ICD-10-CM

## 2020-11-18 DIAGNOSIS — N132 Hydronephrosis with renal and ureteral calculous obstruction: Secondary | ICD-10-CM | POA: Diagnosis present

## 2020-11-18 DIAGNOSIS — Z79899 Other long term (current) drug therapy: Secondary | ICD-10-CM | POA: Diagnosis not present

## 2020-11-18 DIAGNOSIS — I700 Atherosclerosis of aorta: Secondary | ICD-10-CM | POA: Diagnosis present

## 2020-11-18 DIAGNOSIS — I493 Ventricular premature depolarization: Secondary | ICD-10-CM | POA: Diagnosis present

## 2020-11-18 DIAGNOSIS — A419 Sepsis, unspecified organism: Secondary | ICD-10-CM | POA: Diagnosis not present

## 2020-11-18 DIAGNOSIS — Z87442 Personal history of urinary calculi: Secondary | ICD-10-CM

## 2020-11-18 DIAGNOSIS — N200 Calculus of kidney: Secondary | ICD-10-CM

## 2020-11-18 DIAGNOSIS — N1330 Unspecified hydronephrosis: Secondary | ICD-10-CM

## 2020-11-18 DIAGNOSIS — A021 Salmonella sepsis: Secondary | ICD-10-CM | POA: Diagnosis not present

## 2020-11-18 DIAGNOSIS — N2889 Other specified disorders of kidney and ureter: Secondary | ICD-10-CM | POA: Diagnosis not present

## 2020-11-18 DIAGNOSIS — J90 Pleural effusion, not elsewhere classified: Secondary | ICD-10-CM | POA: Diagnosis not present

## 2020-11-18 DIAGNOSIS — A4159 Other Gram-negative sepsis: Principal | ICD-10-CM | POA: Diagnosis present

## 2020-11-18 DIAGNOSIS — R0602 Shortness of breath: Secondary | ICD-10-CM

## 2020-11-18 DIAGNOSIS — Z85828 Personal history of other malignant neoplasm of skin: Secondary | ICD-10-CM | POA: Diagnosis not present

## 2020-11-18 DIAGNOSIS — K449 Diaphragmatic hernia without obstruction or gangrene: Secondary | ICD-10-CM | POA: Diagnosis not present

## 2020-11-18 DIAGNOSIS — F419 Anxiety disorder, unspecified: Secondary | ICD-10-CM | POA: Diagnosis present

## 2020-11-18 DIAGNOSIS — R7989 Other specified abnormal findings of blood chemistry: Secondary | ICD-10-CM | POA: Diagnosis not present

## 2020-11-18 DIAGNOSIS — R918 Other nonspecific abnormal finding of lung field: Secondary | ICD-10-CM | POA: Diagnosis not present

## 2020-11-18 DIAGNOSIS — K8020 Calculus of gallbladder without cholecystitis without obstruction: Secondary | ICD-10-CM | POA: Diagnosis not present

## 2020-11-18 DIAGNOSIS — N179 Acute kidney failure, unspecified: Secondary | ICD-10-CM | POA: Diagnosis present

## 2020-11-18 DIAGNOSIS — N281 Cyst of kidney, acquired: Secondary | ICD-10-CM | POA: Diagnosis not present

## 2020-11-18 DIAGNOSIS — N390 Urinary tract infection, site not specified: Secondary | ICD-10-CM | POA: Diagnosis not present

## 2020-11-18 DIAGNOSIS — R338 Other retention of urine: Secondary | ICD-10-CM | POA: Diagnosis not present

## 2020-11-18 DIAGNOSIS — R778 Other specified abnormalities of plasma proteins: Secondary | ICD-10-CM

## 2020-11-18 DIAGNOSIS — Z20822 Contact with and (suspected) exposure to covid-19: Secondary | ICD-10-CM | POA: Diagnosis present

## 2020-11-18 DIAGNOSIS — N189 Chronic kidney disease, unspecified: Secondary | ICD-10-CM | POA: Diagnosis not present

## 2020-11-18 DIAGNOSIS — G4733 Obstructive sleep apnea (adult) (pediatric): Secondary | ICD-10-CM | POA: Diagnosis present

## 2020-11-18 DIAGNOSIS — N134 Hydroureter: Secondary | ICD-10-CM | POA: Diagnosis not present

## 2020-11-18 DIAGNOSIS — N202 Calculus of kidney with calculus of ureter: Secondary | ICD-10-CM | POA: Diagnosis not present

## 2020-11-18 DIAGNOSIS — B961 Klebsiella pneumoniae [K. pneumoniae] as the cause of diseases classified elsewhere: Secondary | ICD-10-CM | POA: Diagnosis not present

## 2020-11-18 HISTORY — PX: CYSTOSCOPY WITH STENT PLACEMENT: SHX5790

## 2020-11-18 LAB — CBC WITH DIFFERENTIAL/PLATELET
Abs Immature Granulocytes: 0.93 10*3/uL — ABNORMAL HIGH (ref 0.00–0.07)
Basophils Absolute: 0.1 10*3/uL (ref 0.0–0.1)
Basophils Relative: 0 %
Eosinophils Absolute: 0.1 10*3/uL (ref 0.0–0.5)
Eosinophils Relative: 0 %
HCT: 42.2 % (ref 39.0–52.0)
Hemoglobin: 13.9 g/dL (ref 13.0–17.0)
Immature Granulocytes: 3 %
Lymphocytes Relative: 1 %
Lymphs Abs: 0.5 10*3/uL — ABNORMAL LOW (ref 0.7–4.0)
MCH: 29.7 pg (ref 26.0–34.0)
MCHC: 32.9 g/dL (ref 30.0–36.0)
MCV: 90.2 fL (ref 80.0–100.0)
Monocytes Absolute: 1.7 10*3/uL — ABNORMAL HIGH (ref 0.1–1.0)
Monocytes Relative: 5 %
Neutro Abs: 29.1 10*3/uL — ABNORMAL HIGH (ref 1.7–7.7)
Neutrophils Relative %: 91 %
Platelets: 133 10*3/uL — ABNORMAL LOW (ref 150–400)
RBC: 4.68 MIL/uL (ref 4.22–5.81)
RDW: 13.4 % (ref 11.5–15.5)
Smear Review: NORMAL
WBC: 32.3 10*3/uL — ABNORMAL HIGH (ref 4.0–10.5)
nRBC: 0 % (ref 0.0–0.2)

## 2020-11-18 LAB — MRSA PCR SCREENING: MRSA by PCR: NEGATIVE

## 2020-11-18 LAB — URINALYSIS, COMPLETE (UACMP) WITH MICROSCOPIC
Bilirubin Urine: NEGATIVE
Glucose, UA: NEGATIVE mg/dL
Ketones, ur: NEGATIVE mg/dL
Nitrite: NEGATIVE
Protein, ur: NEGATIVE mg/dL
Specific Gravity, Urine: 1.004 — ABNORMAL LOW (ref 1.005–1.030)
WBC, UA: >50 WBC/hpf — ABNORMAL HIGH (ref 0–5)
pH: 5 (ref 5.0–8.0)

## 2020-11-18 LAB — LACTIC ACID, PLASMA
Lactic Acid, Venous: 5.6 mmol/L (ref 0.5–1.9)
Lactic Acid, Venous: 3.8 mmol/L (ref 0.5–1.9)

## 2020-11-18 LAB — COMPREHENSIVE METABOLIC PANEL
ALT: 13 U/L (ref 0–44)
AST: 39 U/L (ref 15–41)
Albumin: 3.5 g/dL (ref 3.5–5.0)
Alkaline Phosphatase: 60 U/L (ref 38–126)
Anion gap: 17 — ABNORMAL HIGH (ref 5–15)
BUN: 35 mg/dL — ABNORMAL HIGH (ref 8–23)
CO2: 18 mmol/L — ABNORMAL LOW (ref 22–32)
Calcium: 8.7 mg/dL — ABNORMAL LOW (ref 8.9–10.3)
Chloride: 104 mmol/L (ref 98–111)
Creatinine, Ser: 3.43 mg/dL — ABNORMAL HIGH (ref 0.61–1.24)
GFR, Estimated: 17 mL/min — ABNORMAL LOW (ref >60)
Glucose, Bld: 138 mg/dL — ABNORMAL HIGH (ref 70–99)
Potassium: 3.7 mmol/L (ref 3.5–5.1)
Sodium: 139 mmol/L (ref 135–145)
Total Bilirubin: 1 mg/dL (ref 0.3–1.2)
Total Protein: 7.1 g/dL (ref 6.5–8.1)

## 2020-11-18 LAB — LIPASE, BLOOD: Lipase: 18 U/L (ref 11–51)

## 2020-11-18 LAB — RESP PANEL BY RT-PCR (FLU A&B, COVID) ARPGX2
Influenza A by PCR: NEGATIVE
Influenza B by PCR: NEGATIVE
SARS Coronavirus 2 by RT PCR: NEGATIVE

## 2020-11-18 LAB — TROPONIN I (HIGH SENSITIVITY)
Troponin I (High Sensitivity): 69 ng/L — ABNORMAL HIGH (ref <18)
Troponin I (High Sensitivity): 58 ng/L — ABNORMAL HIGH (ref <18)

## 2020-11-18 LAB — APTT: aPTT: 36 seconds (ref 24–36)

## 2020-11-18 LAB — GLUCOSE, CAPILLARY: Glucose-Capillary: 95 mg/dL (ref 70–99)

## 2020-11-18 LAB — PROTIME-INR
INR: 1.3 — ABNORMAL HIGH (ref 0.8–1.2)
Prothrombin Time: 15.8 seconds — ABNORMAL HIGH (ref 11.4–15.2)

## 2020-11-18 LAB — BRAIN NATRIURETIC PEPTIDE: B Natriuretic Peptide: 811.6 pg/mL — ABNORMAL HIGH (ref 0.0–100.0)

## 2020-11-18 LAB — BLADDER SCAN AMB NON-IMAGING: Scan Result: 48

## 2020-11-18 SURGERY — CYSTOSCOPY, WITH STENT INSERTION
Anesthesia: General | Laterality: Right

## 2020-11-18 MED ORDER — LACTATED RINGERS IV BOLUS
1000.0000 mL | Freq: Once | INTRAVENOUS | Status: AC
  Administered 2020-11-18: 1000 mL via INTRAVENOUS

## 2020-11-18 MED ORDER — LACTATED RINGERS IV SOLN: INTRAVENOUS | Status: DC

## 2020-11-18 MED ORDER — MEPERIDINE HCL 50 MG/ML IJ SOLN: 12.5000 mg | Freq: Once | INTRAMUSCULAR | Status: AC

## 2020-11-18 MED ORDER — MEPERIDINE HCL 50 MG/ML IJ SOLN
INTRAMUSCULAR | Status: AC
  Administered 2020-11-18: 12.5 mg via INTRAVENOUS
  Filled 2020-11-18: qty 1

## 2020-11-18 MED ORDER — LIDOCAINE HCL URETHRAL/MUCOSAL 2 % EX GEL
CUTANEOUS | Status: AC
  Filled 2020-11-18: qty 10

## 2020-11-18 MED ORDER — CHLORHEXIDINE GLUCONATE CLOTH 2 % EX PADS
6.0000 | MEDICATED_PAD | Freq: Every day | CUTANEOUS | Status: DC
  Administered 2020-11-19 – 2020-11-22 (×4): 6 via TOPICAL

## 2020-11-18 MED ORDER — PROPOFOL 10 MG/ML IV BOLUS
INTRAVENOUS | Status: DC | PRN
  Administered 2020-11-18 (×2): 20 mg via INTRAVENOUS

## 2020-11-18 MED ORDER — SODIUM CHLORIDE 0.9 % IV BOLUS
1000.0000 mL | Freq: Once | INTRAVENOUS | Status: AC
  Administered 2020-11-18: 1000 mL via INTRAVENOUS

## 2020-11-18 MED ORDER — ONDANSETRON HCL 4 MG/2ML IJ SOLN: 4.0000 mg | Freq: Once | INTRAMUSCULAR | Status: DC | PRN

## 2020-11-18 MED ORDER — SODIUM CHLORIDE 0.9 % IV SOLN
1.0000 g | Freq: Once | INTRAVENOUS | Status: AC
  Administered 2020-11-18: 1 g via INTRAVENOUS
  Filled 2020-11-18: qty 1

## 2020-11-18 MED ORDER — NOREPINEPHRINE 4 MG/250ML-% IV SOLN
0.0000 ug/min | INTRAVENOUS | Status: DC
  Administered 2020-11-18: 2 ug/min via INTRAVENOUS
  Administered 2020-11-19: 6 ug/min via INTRAVENOUS
  Filled 2020-11-18: qty 250

## 2020-11-18 MED ORDER — FENTANYL CITRATE (PF) 100 MCG/2ML IJ SOLN
INTRAMUSCULAR | Status: AC
  Filled 2020-11-18: qty 2

## 2020-11-18 MED ORDER — LACTATED RINGERS IV BOLUS: 1000.0000 mL | Freq: Once | INTRAVENOUS | Status: DC

## 2020-11-18 MED ORDER — NOREPINEPHRINE 4 MG/250ML-% IV SOLN
INTRAVENOUS | Status: AC
  Administered 2020-11-18: 2 ug/min via INTRAVENOUS
  Filled 2020-11-18: qty 250

## 2020-11-18 MED ORDER — IOHEXOL 180 MG/ML  SOLN
INTRAMUSCULAR | Status: DC | PRN
  Administered 2020-11-18: 15 mL

## 2020-11-18 MED ORDER — SODIUM CHLORIDE 0.9 % IV SOLN: INTRAVENOUS | Status: DC | PRN

## 2020-11-18 MED ORDER — PROPOFOL 10 MG/ML IV BOLUS
INTRAVENOUS | Status: AC
  Filled 2020-11-18: qty 20

## 2020-11-18 MED ORDER — FENTANYL CITRATE (PF) 100 MCG/2ML IJ SOLN: 25.0000 ug | INTRAMUSCULAR | Status: DC | PRN

## 2020-11-18 MED ORDER — FENTANYL CITRATE (PF) 100 MCG/2ML IJ SOLN
INTRAMUSCULAR | Status: DC | PRN
  Administered 2020-11-18 (×2): 25 ug via INTRAVENOUS

## 2020-11-18 MED ORDER — SODIUM CHLORIDE 0.9 % IV SOLN
2.0000 g | INTRAVENOUS | Status: DC
  Filled 2020-11-18: qty 2

## 2020-11-18 MED ORDER — LIDOCAINE HCL URETHRAL/MUCOSAL 2 % EX GEL
CUTANEOUS | Status: DC | PRN
  Administered 2020-11-18: 1 via URETHRAL

## 2020-11-18 SURGICAL SUPPLY — 18 items
BAG DRAIN CYSTO-URO LG1000N (MISCELLANEOUS) ×2 IMPLANT
BRUSH SCRUB EZ 1% IODOPHOR (MISCELLANEOUS) IMPLANT
CATH URETL 5X70 OPEN END (CATHETERS) ×2 IMPLANT
GLOVE BIOGEL PI IND STRL 7.5 (GLOVE) ×1 IMPLANT
GLOVE BIOGEL PI INDICATOR 7.5 (GLOVE) ×1
GOWN STRL REUS W/ TWL XL LVL3 (GOWN DISPOSABLE) ×1 IMPLANT
GOWN STRL REUS W/TWL XL LVL3 (GOWN DISPOSABLE) ×1
GUIDEWIRE STR DUAL SENSOR (WIRE) ×2 IMPLANT
KIT TURNOVER CYSTO (KITS) ×2 IMPLANT
MANIFOLD NEPTUNE II (INSTRUMENTS) IMPLANT
PACK CYSTO AR (MISCELLANEOUS) ×2 IMPLANT
SET CYSTO W/LG BORE CLAMP LF (SET/KITS/TRAYS/PACK) ×2 IMPLANT
SOL .9 NS 3000ML IRR  AL (IV SOLUTION) ×2
SOL .9 NS 3000ML IRR UROMATIC (IV SOLUTION) ×1 IMPLANT
STENT URET 6FRX24 CONTOUR (STENTS) ×2 IMPLANT
STENT URET 6FRX26 CONTOUR (STENTS) IMPLANT
SURGILUBE 2OZ TUBE FLIPTOP (MISCELLANEOUS) ×2 IMPLANT
WATER STERILE IRR 1000ML POUR (IV SOLUTION) ×2 IMPLANT

## 2020-11-18 NOTE — Telephone Encounter (Signed)
Pt wife called office stating pt hasn't urinated in 2 days and feels like he has another kidney stone.  Pt hasn't slept much due to pain, because of pain and no urine x2days, advised pt to come on to the office. Pt denies bladder pain but c/o side and back pain, some nausea.

## 2020-11-18 NOTE — ED Notes (Signed)
Report given to OR at this time.

## 2020-11-18 NOTE — ED Notes (Signed)
Date and time results received: 11/18/20 5:12 PM   Test: Lactic  Critical Value: 5.6  Name of Provider Notified: Cinda Quest, MD   Orders Received? Or Actions Taken?: Provider aware.

## 2020-11-18 NOTE — H&P (View-Only) (Signed)
Urology Consult  Requesting physician: Dr. Cinda Quest  Reason for consultation: Obstructing right ureteral calculus with sepsis   Chief Complaint: Abdominal pain  History of Present Illness: [name] is a 79 y.o. male with a history of recurrent stone disease.   [facility] Urological patient with a history of recurrent stone disease  Called for an urgent appointment this afternoon with complaints of not voiding x48 hours, nausea and right flank and right lower quadrant abdominal pain  When seen in the office was slumped over in chair, hypotensive and tachypneic and was transported to the ED for further evaluation  Lactate elevated 5.6  UA >50 WBCs  Leukocytosis 32.3  Stone protocol CT with a 7 mm right distal ureteral calculus with mild to moderate hydronephrosis/hydroureter  3 mm calculus just distal to the 7 mm stone  + Nausea without vomiting  Denied fever or chills but wife stated had multiple blankets last night  Past Medical History:  Diagnosis Date   Actinic keratosis    BPH with urinary obstruction    sees urology Dr. [name]   Carotid stenosis 07/20/2016   Minimal on [place] 07/2016. Monitor clinically.   Depression with anxiety    Elevated PSA 2014   sees urology - normal biopsies x 3   History of kidney stones    HLD (hyperlipidemia)    Left kidney mass 09/2014   on CT scan thought consistent with simple cyst on prior US   Nasal obstruction    congenital R sided   Obstructive pyelonephritis 09/2014   s/p hospitalization and treatment with levaquin   OSA (obstructive sleep apnea)    pt denies   Osteoporosis 06/04/2018   DEXA T score -37 at L spine   Recurrent kidney stones 2014   extensive bilateral nephrolithiasis, sees urology [name]), hydrocodone prn   Squamous cell carcinoma of skin 07/25/2019   crown scalp   Squamous cell carcinoma of skin 06/29/2017   SCC IS ant to crown post   Symptomatic PVCs    per prior  cardiologist, on beta blocker   Urge incontinence    thought overactive bladder    Past Surgical History:  Procedure Laterality Date   CARDIAC CATHETERIZATION  03/2012   EF 45-50%, no LVH, + impaired LV relaxation, no PH, no valve abnormalities   CARDIOVASCULAR STRESS TEST  03/2012   mild peri infarct ischemia in apical segment, decreased EF   CATARACT EXTRACTION Right 01/2013   COLONOSCOPY  02/2008   mod diverticulosis o/w WNL (2009)   CYSTOSCOPY     CYSTOSCOPY  10/2014   s/p stone removal, with stent removal [name])   CYSTOSCOPY/URETEROSCOPY/HOLMIUM LASER/STENT PLACEMENT Left 08/11/2020   Procedure: CYSTOSCOPY/URETEROSCOPY/STENT PLACEMENT;  Surgeon: Hollice Espy, MD;  Location: [facility] ORS;  Service: Urology;  Laterality: Left;   CYSTOSCOPY/URETEROSCOPY/HOLMIUM LASER/STENT PLACEMENT N/A 08/28/2020   Procedure: CYSTOSCOPY/URETEROSCOPY/HOLMIUM LASER/STENT exchange;  Surgeon: Hollice Espy, MD;  Location: [facility] ORS;  Service: Urology;  Laterality: N/A;   HOLEP-LASER ENUCLEATION OF THE PROSTATE WITH MORCELLATION N/A 08/11/2020   Procedure: HOLEP-LASER ENUCLEATION OF THE PROSTATE WITH MORCELLATION;  Surgeon: Hollice Espy, MD;  Location: [facility] ORS;  Service: Urology;  Laterality: N/A;   TONSILECTOMY, ADENOIDECTOMY, BILATERAL MYRINGOTOMY AND TUBES     removed as a child   US ECHOCARDIOGRAPHY  03/2012   hypokinetic anterior wall, EF 45-50%, impaired relaxation pattern, mild LA dilation, mild-mod MR, mild-mod AR    Home Medications:  No outpatient medications have been marked as taking for the 11/18/20  encounter [facility] Encounter).    Allergies: No Known Allergies  Family History  Problem Relation Age of Onset   Urolithiasis Daughter    Kidney Stones Mother    Diabetes Neg Hx    Cancer Neg Hx    CAD Neg Hx    Stroke Neg Hx    Hypertension Neg Hx    Prostate cancer Neg Hx    Kidney cancer Neg Hx    Bladder Cancer Neg Hx     Social History:  reports that he  has never smoked. He has never used smokeless tobacco. He reports that he does not drink alcohol and does not use drugs.  ROS: A complete review of systems was performed.  All systems are negative except for pertinent findings as noted.  Physical Exam:  Vital signs in last 24 hours: Temp:  [97.4 F (36.3 C)-98.1 F (36.7 C)] 98 F (36.7 C) (12/07 1845) Pulse Rate:  [35-70] 58 (12/07 1930) Resp:  [0-31] 13 (12/07 1930) BP: (75-121)/(42-91) 107/46 (12/07 1930) SpO2:  [92 %-98 %] 93 % (12/07 1930) Weight:  [56.7 kg-67.1 kg] 56.7 kg (12/07 1700) Constitutional:  Alert, No acute distress HEENT: [place] AT, moist mucus membranes.  Trachea midline, no masses Cardiovascular: Regular rate and rhythm, no clubbing, cyanosis, or edema. Respiratory: Normal respiratory effort, lungs clear bilaterally GI: Abdomen is soft, nontender, nondistended, no abdominal masses GU: No CVA tenderness Skin: No rashes, bruises or suspicious lesions Lymph: No cervical or inguinal adenopathy Neurologic: Grossly intact, no focal deficits, moving all 4 extremities Psychiatric: Normal mood and affect   Laboratory Data:  Recent Labs    11/18/20 1635  WBC 32.3*  HGB 13.9  HCT 42.2   Recent Labs    11/18/20 1635  NA 139  K 3.7  CL 104  CO2 18*  GLUCOSE 138*  BUN 35*  CREATININE 3.43*  CALCIUM 8.7*   No results for input(s): LABPT, INR in the last 72 hours. No results for input(s): LABURIN in the last 72 hours. Results for orders placed or performed during the hospital encounter of 11/18/20  Resp Panel by RT-PCR (Flu A&B, Covid) Nasopharyngeal Swab     Status: None   Collection Time: 11/18/20  4:36 PM   Specimen: Nasopharyngeal Swab; Nasopharyngeal(NP) swabs in vial transport medium  Result Value Ref Range Status   SARS Coronavirus 2 by RT PCR NEGATIVE NEGATIVE Final    Comment: (NOTE) SARS-CoV-2 target nucleic acids are NOT DETECTED.  The SARS-CoV-2 RNA is generally detectable in upper  respiratory specimens during the acute phase of infection. The lowest concentration of SARS-CoV-2 viral copies this assay can detect is 138 copies/mL. A negative result does not preclude SARS-Cov-2 infection and should not be used as the sole basis for treatment or other patient management decisions. A negative result may occur with  improper specimen collection/handling, submission of specimen other than nasopharyngeal swab, presence of viral mutation(s) within the areas targeted by this assay, and inadequate number of viral copies(<138 copies/mL). A negative result must be combined with clinical observations, patient history, and epidemiological information. The expected result is Negative.  Fact Sheet for Patients:  EntrepreneurPulse.com.au  Fact Sheet for Healthcare Providers:  IncredibleEmployment.be  This test is no t yet approved or cleared by the [place] FDA and  has been authorized for detection and/or diagnosis of SARS-CoV-2 by FDA under an Emergency Use Authorization (EUA). This EUA will remain  in effect (meaning this test can be used) for the duration of the COVID-19  declaration under Section 564(b)(1) of the Act, 21 U.S.C.section 360bbb-3(b)(1), unless the authorization is terminated  or revoked sooner.       Influenza A by PCR NEGATIVE NEGATIVE Final   Influenza B by PCR NEGATIVE NEGATIVE Final    Comment: (NOTE) The Xpert Xpress SARS-CoV-2/FLU/RSV plus assay is intended as an aid in the diagnosis of influenza from Nasopharyngeal swab specimens and should not be used as a sole basis for treatment. Nasal washings and aspirates are unacceptable for Xpert Xpress SARS-CoV-2/FLU/RSV testing.  Fact Sheet for Patients: EntrepreneurPulse.com.au  Fact Sheet for Healthcare Providers: IncredibleEmployment.be  This test is not yet approved or cleared by the [place] FDA and has been  authorized for detection and/or diagnosis of SARS-CoV-2 by FDA under an Emergency Use Authorization (EUA). This EUA will remain in effect (meaning this test can be used) for the duration of the COVID-19 declaration under Section 564(b)(1) of the Act, 21 U.S.C. section 360bbb-3(b)(1), unless the authorization is terminated or revoked.  Performed at [facility], [address], [place], [place] [address]      Radiologic Imaging: Abdomen 1 view (KUB)  Result Date: 11/18/2020 CLINICAL DATA:  Right-sided kidney stone unable to urinate EXAM: ABDOMEN - 1 VIEW COMPARISON:  07/15/2020, CT 11/18/2020 FINDINGS: Nonobstructed gas pattern. Probable scarring at the right lung base. Multiple faint calcifications projecting over bilateral kidneys consistent with renal calculi. 4 mm calcification in the right pelvis may correspond to the CT demonstrated right ureteral stone. IMPRESSION: 1. 4 mm calcification in the right pelvis likely corresponds to CT demonstrated distal ureteral stone. 2. Bilateral nephrolithiasis. Electronically Signed   By: Donavan Foil M.D.   On: 11/18/2020 17:58   DG Chest Portable 1 View  Result Date: 11/18/2020 CLINICAL DATA:  Low oxygen saturation EXAM: PORTABLE CHEST 1 VIEW COMPARISON:  None. FINDINGS: Linear scarring or atelectasis at the right base. No consolidation or effusion. Small hiatal hernia. Cardiomediastinal silhouette within normal limits. Aortic atherosclerosis. No pneumothorax. Partially visualized sclerotic lesion in the proximal right humerus IMPRESSION: 1. Linear scarring or atelectasis at the right base. No acute infiltrate or edema. 2. Small hiatal hernia. Electronically Signed   By: Donavan Foil M.D.   On: 11/18/2020 17:53   DG OR UROLOGY CYSTO IMAGE ([facility] ONLY)  Result Date: 11/18/2020 There is no interpretation for this exam.  This order is for images obtained during a surgical procedure.  Please See "Surgeries" Tab for more information regarding  the procedure.   CT Renal Stone Study  Result Date: 11/18/2020 CLINICAL DATA:  Flank pain, history of nephrolithiasis. EXAM: CT ABDOMEN AND PELVIS WITHOUT CONTRAST TECHNIQUE: Multidetector CT imaging of the abdomen and pelvis was performed following the standard protocol without IV contrast. COMPARISON:  CT 08/26/2020 FINDINGS: Lower chest: Lung bases are clear. Hepatobiliary: Low-density lesion in the RIGHT hepatic lobe measuring 8 mm unchanged from prior. Multiple gallstones within a noninflamed gallbladder. Pancreas: Pancreas is normal. No ductal dilatation. No pancreatic inflammation. Spleen: Normal spleen Adrenals/urinary tract: Adrenal glands normal. Ten nonobstructing calculi present in the RIGHT kidney ranging size from 1 to 4 mm. There is hydroureter on the RIGHT secondary to obstructing calculus in the distal RIGHT ureter measuring 3 mm on image 73/2. This RIGHT distal ureteral calculus is approximately 3 cm from the vesicoureteral junction. Similar multiple calcifications in the LEFT kidney. No LEFT ureterolithiasis. No bladder calculi. Foley catheter is positioned within a collapsed bladder. Low-density exophytic lesion posterior to the LEFT kidney likely represents a benign cyst (20  mm, image 35/2). Stomach/Bowel: Moderate hiatal hernia. Stomach, small bowel, appendix, and cecum are normal. The colon and rectosigmoid colon are normal. Vascular/Lymphatic: Abdominal aorta is normal caliber with atherosclerotic calcification. There is no retroperitoneal or periportal lymphadenopathy. No pelvic lymphadenopathy. Reproductive: Prostate unremarkable Other: No free fluid. Musculoskeletal: No aggressive osseous lesion. IMPRESSION: 1. Obstructing calculus in the distal RIGHT ureter with mild hydroureter. 2. Bilateral nephrolithiasis. 3. Foley catheter positioned within a collapsed bladder. 4. Cholelithiasis without evidence cholecystitis. 5. Aortic Atherosclerosis (ICD10-I70.0). Electronically Signed   By:  Suzy Bouchard M.D.   On: 11/18/2020 17:48   CT images personally reviewed and interpreted.  By my review there is a 7 mm right distal ureteral calculus and a 3 mm calculus just distal to the larger stone  Impression/Assessment:   1.  Obstructing right distal ureteral calculus with sepsis  Plan:   Urgent cystoscopy with placement right ureteral stent  The procedure was cussed in detail potential risks of bleeding, worsening sepsis and inability to place a stent secondary to stone impaction.  We discussed if this were to occur he would need placement of a percutaneous nephrostomy tube by interventional radiology  All questions were answered and he desires to proceed   11/18/2020, 7:34 PM  John Giovanni,  MD

## 2020-11-18 NOTE — Consult Note (Signed)
Urology Consult  Requesting physician: Dr. Cinda Brown  Reason for consultation: Obstructing right ureteral calculus with sepsis   Chief Complaint: Abdominal pain  History of Present Illness: [name] is a 79 y.o. male with a history of recurrent stone disease.   [facility] Urological patient with a history of recurrent stone disease  Called for an urgent appointment this afternoon with complaints of not voiding x48 hours, nausea and right flank and right lower quadrant abdominal pain  When seen in the office was slumped over in chair, hypotensive and tachypneic and was transported to the ED for further evaluation  Lactate elevated 5.6  UA >50 WBCs  Leukocytosis 32.3  Stone protocol CT with a 7 mm right distal ureteral calculus with mild to moderate hydronephrosis/hydroureter  3 mm calculus just distal to the 7 mm stone  + Nausea without vomiting  Denied fever or chills but wife stated had multiple blankets last night  Past Medical History:  Diagnosis Date  . Actinic keratosis   . BPH with urinary obstruction    sees urology Dr. Erlene Brown  . Carotid stenosis 07/20/2016   Minimal on [place] 07/2016. Monitor clinically.  . Depression with anxiety   . Elevated PSA 2014   sees urology - normal biopsies x 3  . History of kidney stones   . HLD (hyperlipidemia)   . Left kidney mass 09/2014   on CT scan thought consistent with simple cyst on prior US  . Nasal obstruction    congenital R sided  . Obstructive pyelonephritis 09/2014   s/p hospitalization and treatment with levaquin  . OSA (obstructive sleep apnea)    pt denies  . Osteoporosis 06/04/2018   DEXA T score -37 at L spine  . Recurrent kidney stones 2014   extensive bilateral nephrolithiasis, sees urology [name]), hydrocodone prn  . Squamous cell carcinoma of skin 07/25/2019   crown scalp  . Squamous cell carcinoma of skin 06/29/2017   SCC IS ant to crown post  . Symptomatic PVCs    per prior  cardiologist, on beta blocker  . Urge incontinence    thought overactive bladder    Past Surgical History:  Procedure Laterality Date  . CARDIAC CATHETERIZATION  03/2012   EF 45-50%, no LVH, + impaired LV relaxation, no PH, no valve abnormalities  . CARDIOVASCULAR STRESS TEST  03/2012   mild peri infarct ischemia in apical segment, decreased EF  . CATARACT EXTRACTION Right 01/2013  . COLONOSCOPY  02/2008   mod diverticulosis o/w WNL (2009)  . CYSTOSCOPY    . CYSTOSCOPY  10/2014   s/p stone removal, with stent removal [name])  . CYSTOSCOPY/URETEROSCOPY/HOLMIUM LASER/STENT PLACEMENT Left 08/11/2020   Procedure: CYSTOSCOPY/URETEROSCOPY/STENT PLACEMENT;  Surgeon: Douglas Espy, MD;  Location: [facility] ORS;  Service: Urology;  Laterality: Left;  . CYSTOSCOPY/URETEROSCOPY/HOLMIUM LASER/STENT PLACEMENT N/A 08/28/2020   Procedure: CYSTOSCOPY/URETEROSCOPY/HOLMIUM LASER/STENT exchange;  Surgeon: Douglas Espy, MD;  Location: [facility] ORS;  Service: Urology;  Laterality: N/A;  . HOLEP-LASER ENUCLEATION OF THE PROSTATE WITH MORCELLATION N/A 08/11/2020   Procedure: HOLEP-LASER ENUCLEATION OF THE PROSTATE WITH MORCELLATION;  Surgeon: Douglas Espy, MD;  Location: [facility] ORS;  Service: Urology;  Laterality: N/A;  . TONSILECTOMY, ADENOIDECTOMY, BILATERAL MYRINGOTOMY AND TUBES     removed as a child  . US ECHOCARDIOGRAPHY  03/2012   hypokinetic anterior wall, EF 45-50%, impaired relaxation pattern, mild LA dilation, mild-mod MR, mild-mod AR    Home Medications:  No outpatient medications have been marked as taking for the 11/18/20  encounter [facility] Encounter).    Allergies: No Known Allergies  Family History  Problem Relation Age of Onset  . Urolithiasis Daughter   . Kidney Stones Mother   . Diabetes Neg Hx   . Cancer Neg Hx   . CAD Neg Hx   . Stroke Neg Hx   . Hypertension Neg Hx   . Prostate cancer Neg Hx   . Kidney cancer Neg Hx   . Bladder Cancer Neg Hx     Social History:  reports that he  has never smoked. He has never used smokeless tobacco. He reports that he does not drink alcohol and does not use drugs.  ROS: A complete review of systems was performed.  All systems are negative except for pertinent findings as noted.  Physical Exam:  Vital signs in last 24 hours: Temp:  [97.4 F (36.3 C)-98.1 F (36.7 C)] 98 F (36.7 C) (12/07 1845) Pulse Rate:  [35-70] 58 (12/07 1930) Resp:  [0-31] 13 (12/07 1930) BP: (75-121)/(42-91) 107/46 (12/07 1930) SpO2:  [92 %-98 %] 93 % (12/07 1930) Weight:  [56.7 kg-67.1 kg] 56.7 kg (12/07 1700) Constitutional:  Alert, No acute distress HEENT: [name] AT, moist mucus membranes.  Trachea midline, no masses Cardiovascular: Regular rate and rhythm, no clubbing, cyanosis, or edema. Respiratory: Normal respiratory effort, lungs clear bilaterally GI: Abdomen is soft, nontender, nondistended, no abdominal masses GU: No CVA tenderness Skin: No rashes, bruises or suspicious lesions Lymph: No cervical or inguinal adenopathy Neurologic: Grossly intact, no focal deficits, moving all 4 extremities Psychiatric: Normal mood and affect   Laboratory Data:  Recent Labs    11/18/20 1635  WBC 32.3*  HGB 13.9  HCT 42.2   Recent Labs    11/18/20 1635  NA 139  K 3.7  CL 104  CO2 18*  GLUCOSE 138*  BUN 35*  CREATININE 3.43*  CALCIUM 8.7*   No results for input(s): LABPT, INR in the last 72 hours. No results for input(s): LABURIN in the last 72 hours. Results for orders placed or performed during the hospital encounter of 11/18/20  Resp Panel by RT-PCR (Flu A&B, Covid) Nasopharyngeal Swab     Status: None   Collection Time: 11/18/20  4:36 PM   Specimen: Nasopharyngeal Swab; Nasopharyngeal(NP) swabs in vial transport medium  Result Value Ref Range Status   SARS Coronavirus 2 by RT PCR NEGATIVE NEGATIVE Final    Comment: (NOTE) SARS-CoV-2 target nucleic acids are NOT DETECTED.  The SARS-CoV-2 RNA is generally detectable in upper  respiratory specimens during the acute phase of infection. The lowest concentration of SARS-CoV-2 viral copies this assay can detect is 138 copies/mL. A negative result does not preclude SARS-Cov-2 infection and should not be used as the sole basis for treatment or other patient management decisions. A negative result may occur with  improper specimen collection/handling, submission of specimen other than nasopharyngeal swab, presence of viral mutation(s) within the areas targeted by this assay, and inadequate number of viral copies(<138 copies/mL). A negative result must be combined with clinical observations, patient history, and epidemiological information. The expected result is Negative.  Fact Sheet for Patients:  EntrepreneurPulse.com.au  Fact Sheet for Healthcare Providers:  IncredibleEmployment.be  This test is no t yet approved or cleared by the [place] FDA and  has been authorized for detection and/or diagnosis of SARS-CoV-2 by FDA under an Emergency Use Authorization (EUA). This EUA will remain  in effect (meaning this test can be used) for the duration of the COVID-19  declaration under Section 564(b)(1) of the Act, 21 U.S.C.section 360bbb-3(b)(1), unless the authorization is terminated  or revoked sooner.       Influenza A by PCR NEGATIVE NEGATIVE Final   Influenza B by PCR NEGATIVE NEGATIVE Final    Comment: (NOTE) The Xpert Xpress SARS-CoV-2/FLU/RSV plus assay is intended as an aid in the diagnosis of influenza from Nasopharyngeal swab specimens and should not be used as a sole basis for treatment. Nasal washings and aspirates are unacceptable for Xpert Xpress SARS-CoV-2/FLU/RSV testing.  Fact Sheet for Patients: EntrepreneurPulse.com.au  Fact Sheet for Healthcare Providers: IncredibleEmployment.be  This test is not yet approved or cleared by the [place] FDA and has been  authorized for detection and/or diagnosis of SARS-CoV-2 by FDA under an Emergency Use Authorization (EUA). This EUA will remain in effect (meaning this test can be used) for the duration of the COVID-19 declaration under Section 564(b)(1) of the Act, 21 U.S.C. section 360bbb-3(b)(1), unless the authorization is terminated or revoked.  Performed at [facility], [address], [place], [place] [address]      Radiologic Imaging: Abdomen 1 view (KUB)  Result Date: 11/18/2020 CLINICAL DATA:  Right-sided kidney stone unable to urinate EXAM: ABDOMEN - 1 VIEW COMPARISON:  07/15/2020, CT 11/18/2020 FINDINGS: Nonobstructed gas pattern. Probable scarring at the right lung base. Multiple faint calcifications projecting over bilateral kidneys consistent with renal calculi. 4 mm calcification in the right pelvis may correspond to the CT demonstrated right ureteral stone. IMPRESSION: 1. 4 mm calcification in the right pelvis likely corresponds to CT demonstrated distal ureteral stone. 2. Bilateral nephrolithiasis. Electronically Signed   By: Donavan Foil M.D.   On: 11/18/2020 17:58   DG Chest Portable 1 View  Result Date: 11/18/2020 CLINICAL DATA:  Low oxygen saturation EXAM: PORTABLE CHEST 1 VIEW COMPARISON:  None. FINDINGS: Linear scarring or atelectasis at the right base. No consolidation or effusion. Small hiatal hernia. Cardiomediastinal silhouette within normal limits. Aortic atherosclerosis. No pneumothorax. Partially visualized sclerotic lesion in the proximal right humerus IMPRESSION: 1. Linear scarring or atelectasis at the right base. No acute infiltrate or edema. 2. Small hiatal hernia. Electronically Signed   By: Donavan Foil M.D.   On: 11/18/2020 17:53   DG OR UROLOGY CYSTO IMAGE ([facility] ONLY)  Result Date: 11/18/2020 There is no interpretation for this exam.  This order is for images obtained during a surgical procedure.  Please See "Surgeries" Tab for more information regarding  the procedure.   CT Renal Stone Study  Result Date: 11/18/2020 CLINICAL DATA:  Flank pain, history of nephrolithiasis. EXAM: CT ABDOMEN AND PELVIS WITHOUT CONTRAST TECHNIQUE: Multidetector CT imaging of the abdomen and pelvis was performed following the standard protocol without IV contrast. COMPARISON:  CT 08/26/2020 FINDINGS: Lower chest: Lung bases are clear. Hepatobiliary: Low-density lesion in the RIGHT hepatic lobe measuring 8 mm unchanged from prior. Multiple gallstones within a noninflamed gallbladder. Pancreas: Pancreas is normal. No ductal dilatation. No pancreatic inflammation. Spleen: Normal spleen Adrenals/urinary tract: Adrenal glands normal. Ten nonobstructing calculi present in the RIGHT kidney ranging size from 1 to 4 mm. There is hydroureter on the RIGHT secondary to obstructing calculus in the distal RIGHT ureter measuring 3 mm on image 73/2. This RIGHT distal ureteral calculus is approximately 3 cm from the vesicoureteral junction. Similar multiple calcifications in the LEFT kidney. No LEFT ureterolithiasis. No bladder calculi. Foley catheter is positioned within a collapsed bladder. Low-density exophytic lesion posterior to the LEFT kidney likely represents a benign cyst (20  mm, image 35/2). Stomach/Bowel: Moderate hiatal hernia. Stomach, small bowel, appendix, and cecum are normal. The colon and rectosigmoid colon are normal. Vascular/Lymphatic: Abdominal aorta is normal caliber with atherosclerotic calcification. There is no retroperitoneal or periportal lymphadenopathy. No pelvic lymphadenopathy. Reproductive: Prostate unremarkable Other: No free fluid. Musculoskeletal: No aggressive osseous lesion. IMPRESSION: 1. Obstructing calculus in the distal RIGHT ureter with mild hydroureter. 2. Bilateral nephrolithiasis. 3. Foley catheter positioned within a collapsed bladder. 4. Cholelithiasis without evidence cholecystitis. 5. Aortic Atherosclerosis (ICD10-I70.0). Electronically Signed   By:  Suzy Bouchard M.D.   On: 11/18/2020 17:48   CT images personally reviewed and interpreted.  By my review there is a 7 mm right distal ureteral calculus and a 3 mm calculus just distal to the larger stone  Impression/Assessment:   1.  Obstructing right distal ureteral calculus with sepsis  Plan:   Urgent cystoscopy with placement right ureteral stent  The procedure was cussed in detail potential risks of bleeding, worsening sepsis and inability to place a stent secondary to stone impaction.  We discussed if this were to occur he would need placement of a percutaneous nephrostomy tube by interventional radiology  All questions were answered and he desires to proceed   11/18/2020, 7:34 PM  Douglas Giovanni,  MD

## 2020-11-18 NOTE — Anesthesia Preprocedure Evaluation (Addendum)
Anesthesia Evaluation  Patient identified by MRN, date of birth, ID band Patient awake    Reviewed: Allergy & Precautions, H&P , NPO status , Patient's Chart, lab work & pertinent test results  History of Anesthesia Complications Negative for: history of anesthetic complications  Airway Mallampati: III  TM Distance: <3 FB     Dental  (+) Teeth Intact   Pulmonary neg pulmonary ROS, sleep apnea , neg COPD,    breath sounds clear to auscultation       Cardiovascular hypertension, (-) angina(-) Past MI and (-) Cardiac Stents (-) dysrhythmias  Rhythm:regular Rate:Normal     Neuro/Psych PSYCHIATRIC DISORDERS Anxiety Depression negative neurological ROS     GI/Hepatic negative GI ROS, Neg liver ROS,   Endo/Other  negative endocrine ROS  Renal/GU ARFRenal diseaseObstructive stone     Musculoskeletal   Abdominal   Peds  Hematology negative hematology ROS (+)   Anesthesia Other Findings Presented to ED hypotensive thought to be from urosepsis, has received 3L IVF and IV abx.  Elevated BNP and troponin. Bradycardic in the ED.  Denies CP. Currently on a low dose norepinephrine gtt. This case is an emergency as he needs the infected stone removed immediately, will proceed to OR.  Reproductive/Obstetrics negative OB ROS                            Anesthesia Physical Anesthesia Plan  ASA: IV and emergent  Anesthesia Plan: General ETT   Post-op Pain Management:    Induction: Rapid sequence  PONV Risk Score and Plan: Ondansetron and Dexamethasone  Airway Management Planned:   Additional Equipment:   Intra-op Plan:   Post-operative Plan:   Informed Consent: I have reviewed the patients History and Physical, chart, labs and discussed the procedure including the risks, benefits and alternatives for the proposed anesthesia with the patient or authorized representative who has indicated his/her  understanding and acceptance.     Dental Advisory Given  Plan Discussed with: Anesthesiologist, CRNA and Surgeon  Anesthesia Plan Comments:         Anesthesia Quick Evaluation

## 2020-11-18 NOTE — Progress Notes (Signed)
Pharmacy Antibiotic Note  [name] is a 79 y.o. male admitted on 11/18/2020 with sepsis.  Pharmacy has been consulted for Cefepime dosing.  Plan: Cefepime 2gm IV q24hrs (renally adjusted)  Height: 5\' 6"  (167.6 cm) Weight: 56.7 kg (125 lb) IBW/kg (Calculated) : 63.8  Temp (24hrs), Avg:97.8 F (36.6 C), Min:97 F (36.1 C), Max:98.5 F (36.9 C)  Recent Labs  Lab 11/18/20 1635 11/18/20 1827  WBC 32.3*  --   CREATININE 3.43*  --   LATICACIDVEN 5.6* 3.8*    Estimated Creatinine Clearance: 13.5 mL/min (A) (by C-G formula based on SCr of 3.43 mg/dL (H)).    No Known Allergies  Antimicrobials this admission:   >>    >>   Dose adjustments this admission:   Microbiology results:  BCx:   UCx:    Sputum:    MRSA PCR:   Thank you for allowing pharmacy to be a part of this patient's care.  Hart Robinsons A 11/18/2020 11:45 PM

## 2020-11-18 NOTE — ED Notes (Signed)
Pt transported to OR at this time with orderly and Olen Cordial, Therapist, sports.

## 2020-11-18 NOTE — Transfer of Care (Signed)
Immediate Anesthesia Transfer of Care Note  Patient: [name]  Procedure(s) Performed: CYSTOSCOPY WITH STENT PLACEMENT (Right )  Patient Location: PACU  Anesthesia Type:MAC  Level of Consciousness: awake and patient cooperative  Airway & Oxygen Therapy: Patient Spontanous Breathing and Patient connected to face mask oxygen  Post-op Assessment: Report given to RN and Post -op Vital signs reviewed and stable  Post vital signs: Reviewed and stable  Last Vitals:  Vitals Value Taken Time  BP 148/82 11/18/20 2028  Temp    Pulse 115 11/18/20 2032  Resp 35 11/18/20 2032  SpO2 96 % 11/18/20 2032  Vitals shown include unvalidated device data.  Last Pain:  Vitals:   11/18/20 1845  TempSrc: Oral         Complications: No complications documented.

## 2020-11-18 NOTE — ED Provider Notes (Signed)
[facility] Emergency Department Provider Note   ____________________________________________   First MD Initiated Contact with Patient 11/18/20 1622     (approximate)  I have reviewed the triage vital signs and the nursing notes.   HISTORY  Chief Complaint Tachycardia    HPI [name] is a 79 y.o. male patient reports several months ago had surgery to remove kidney stones.  They thought all the kidney stones were gone but last night he felt like he was having another kidney stones.  Did not want to come in because he did not want a wait so he came in today and apparently got weak in the waiting room or the medical mall.  Comes and feeling weak says he has made no urinate since last night.  Tachycardic blood pressures in the 70s and his O2 sats are 89 on room air.  Does not seem to be having much pain at the present time.  He is weak and pale though.  He has not had a fever last night or today.  Does not appear to be having any chest pain.         Past Medical History:  Diagnosis Date  . Actinic keratosis   . BPH with urinary obstruction    sees urology Dr. [name]  . Carotid stenosis 07/20/2016   Minimal on [place] 07/2016. Monitor clinically.  . Depression with anxiety   . Elevated PSA 2014   sees urology - normal biopsies x 3  . History of kidney stones   . HLD (hyperlipidemia)   . Left kidney mass 09/2014   on CT scan thought consistent with simple cyst on prior US  . Nasal obstruction    congenital R sided  . Obstructive pyelonephritis 09/2014   s/p hospitalization and treatment with levaquin  . OSA (obstructive sleep apnea)    pt denies  . Osteoporosis 06/04/2018   DEXA T score -37 at L spine  . Recurrent kidney stones 2014   extensive bilateral nephrolithiasis, sees urology [name]), hydrocodone prn  . Squamous cell carcinoma of skin 07/25/2019   crown scalp  . Squamous cell carcinoma of skin 06/29/2017   SCC IS ant to crown post  .  Symptomatic PVCs    per prior cardiologist, on beta blocker  . Urge incontinence    thought overactive bladder    Patient Active Problem List   Diagnosis Date Noted  . Anemia 09/06/2018  . Hypertension 09/06/2018  . HLD (hyperlipidemia) 08/11/2018  . Hyperparathyroidism ([place]) 06/21/2018  . Osteoporosis 06/04/2018  . History of vertebral compression fracture 05/27/2018  . Lumbar scoliosis 05/27/2018  . Aorto-iliac atherosclerosis ([place]) 05/27/2018  . Chronic nasal congestion 05/27/2018  . Lumbar spondylosis 04/05/2018  . Health maintenance examination 07/20/2016  . Advanced care planning/counseling discussion 07/20/2016  . Carotid stenosis 07/20/2016  . Renal insufficiency 07/20/2016  . Left kidney mass 09/12/2014  . Medicare annual wellness visit, subsequent 02/19/2014  . Hyperglycemia 02/19/2014  . BPH with urinary obstruction   . Recurrent kidney stones   . Elevated PSA   . Depression with anxiety   . Symptomatic PVCs   . Dyspnea on exertion 03/30/2012  . Left ventricular dysfunction 03/30/2012  . Abnormal EKG 03/16/2012  . Fatigue 03/06/2012  . Anxiety 01/18/2012  . History of kidney stones 12/28/2011    Past Surgical History:  Procedure Laterality Date  . CARDIAC CATHETERIZATION  03/2012   EF 45-50%, no LVH, + impaired LV relaxation, no PH, no valve abnormalities  .  CARDIOVASCULAR STRESS TEST  03/2012   mild peri infarct ischemia in apical segment, decreased EF  . CATARACT EXTRACTION Right 01/2013  . COLONOSCOPY  02/2008   mod diverticulosis o/w WNL (2009)  . CYSTOSCOPY    . CYSTOSCOPY  10/2014   s/p stone removal, with stent removal [name])  . CYSTOSCOPY/URETEROSCOPY/HOLMIUM LASER/STENT PLACEMENT Left 08/11/2020   Procedure: CYSTOSCOPY/URETEROSCOPY/STENT PLACEMENT;  Surgeon: Hollice Espy, MD;  Location: [facility] ORS;  Service: Urology;  Laterality: Left;  . CYSTOSCOPY/URETEROSCOPY/HOLMIUM LASER/STENT PLACEMENT N/A 08/28/2020   Procedure:  CYSTOSCOPY/URETEROSCOPY/HOLMIUM LASER/STENT exchange;  Surgeon: Hollice Espy, MD;  Location: [facility] ORS;  Service: Urology;  Laterality: N/A;  . HOLEP-LASER ENUCLEATION OF THE PROSTATE WITH MORCELLATION N/A 08/11/2020   Procedure: HOLEP-LASER ENUCLEATION OF THE PROSTATE WITH MORCELLATION;  Surgeon: Hollice Espy, MD;  Location: [facility] ORS;  Service: Urology;  Laterality: N/A;  . TONSILECTOMY, ADENOIDECTOMY, BILATERAL MYRINGOTOMY AND TUBES     removed as a child  . US ECHOCARDIOGRAPHY  03/2012   hypokinetic anterior wall, EF 45-50%, impaired relaxation pattern, mild LA dilation, mild-mod MR, mild-mod AR    Prior to Admission medications   Medication Sig Start Date End Date Taking? Authorizing Provider  Calcium Carb-Cholecalciferol (CALCIUM 600+D3 PO) Take 1 tablet by mouth daily with breakfast.     [provider]  ciprofloxacin (CIPRO) 500 MG tablet Take 1 tablet (500 mg total) by mouth every 12 (twelve) hours. 08/26/20   Hollice Espy, MD  finasteride (PROSCAR) 5 MG tablet Take 1 tablet (5 mg total) by mouth daily. 07/22/20   [name] A, [name]  fluconazole (DIFLUCAN) 200 MG tablet Take 1 tablet (200 mg total) by mouth once a week. Take 1 pill a week 03/26/20   Ralene Bathe, MD  metoprolol succinate (TOPROL-XL) 25 MG 24 hr tablet TAKE 1/2 TABLET EVERY DAY Patient taking differently: Take 12.5 mg by mouth daily with breakfast.  10/12/19   [name], MD  Multiple Vitamin (MULTIVITAMIN WITH MINERALS) TABS tablet Take 1 tablet by mouth daily with breakfast.     [provider]  tamsulosin (FLOMAX) 0.4 MG CAPS capsule Take 1 capsule (0.4 mg total) by mouth daily. 07/15/20   [name] A, [name]    Allergies Patient has no known allergies.  Family History  Problem Relation Age of Onset  . Urolithiasis Daughter   . Kidney Stones Mother   . Diabetes Neg Hx   . Cancer Neg Hx   . CAD Neg Hx   . Stroke Neg Hx   . Hypertension Neg Hx   . Prostate cancer Neg  Hx   . Kidney cancer Neg Hx   . Bladder Cancer Neg Hx     Social History Social History   Tobacco Use  . Smoking status: Never Smoker  . Smokeless tobacco: Never Used  Vaping Use  . Vaping Use: Never used  Substance Use Topics  . Alcohol use: No  . Drug use: No    Review of Systems  Constitutional: No fever/chills Eyes: No visual changes. ENT: No sore throat. Cardiovascular: Denies chest pain. Respiratory: Denies shortness of breath. Gastrointestinal: No abdominal pain.  No nausea, no vomiting.  No diarrhea.  No constipation. Genitourinary: Unable to pass urine since last night Musculoskeletal: Negative for back pain. Skin: Negative for rash. Neurological: Negative for headaches, focal weakness or   ____________________________________________   PHYSICAL EXAM:  VITAL SIGNS: ED Triage Vitals  Enc Vitals Group     BP      Pulse  Resp      Temp      Temp src      SpO2      Weight      Height      Head Circumference      Peak Flow      Pain Score      Pain Loc      Pain Edu?      Excl. in [place]?     Constitutional: Pale and ill-appearing very soft-spoken takes a while to answer questions Eyes: Conjunctivae are normal. PER Head: Atraumatic. Nose: No congestion/rhinnorhea. Mouth/Throat: Mucous membranes are moist.  Oropharynx non-erythematous. Neck: No stridor Cardiovascular: Normal rate, regular rhythm. Grossly normal heart sounds.  Good peripheral circulation. Respiratory: Normal respiratory effort.  No retractions. Lungs CTAB. Gastrointestinal: Soft and nontender. No distention. No abdominal bruits Musculoskeletal: No lower extremity tenderness nor edema.   Neurologic:  Normal speech and language. No gross focal neurologic deficits are appreciated.  Skin:  Skin is warm, dry and intact. No rash noted.   ____________________________________________   LABS (all labs ordered are listed, but only abnormal results are displayed)  Labs Reviewed   COMPREHENSIVE METABOLIC PANEL - Abnormal; Notable for the following components:      Result Value   CO2 18 (*)    Glucose, Bld 138 (*)    BUN 35 (*)    Creatinine, Ser 3.43 (*)    Calcium 8.7 (*)    GFR, Estimated 17 (*)    Anion gap 17 (*)    All other components within normal limits  BRAIN NATRIURETIC PEPTIDE - Abnormal; Notable for the following components:   B Natriuretic Peptide 811.6 (*)    All other components within normal limits  LACTIC ACID, PLASMA - Abnormal; Notable for the following components:   Lactic Acid, Venous 5.6 (*)    All other components within normal limits  CBC WITH DIFFERENTIAL/PLATELET - Abnormal; Notable for the following components:   WBC 32.3 (*)    Platelets 133 (*)    Neutro Abs 29.1 (*)    Lymphs Abs 0.5 (*)    Monocytes Absolute 1.7 (*)    Abs Immature Granulocytes 0.93 (*)    All other components within normal limits  TROPONIN I (HIGH SENSITIVITY) - Abnormal; Notable for the following components:   Troponin I (High Sensitivity) 69 (*)    All other components within normal limits  CULTURE, BLOOD (ROUTINE X 2)  CULTURE, BLOOD (ROUTINE X 2)  URINE CULTURE  CULTURE, BLOOD (SINGLE)  RESP PANEL BY RT-PCR (FLU A&B, COVID) ARPGX2  LIPASE, BLOOD  LACTIC ACID, PLASMA  URINALYSIS, COMPLETE (UACMP) WITH MICROSCOPIC  PROTIME-INR  APTT   ____________________________________________  EKG  ________________________________________  RADIOLOGY Gertha Calkin, personally viewed and evaluated these images (plain radiographs) as part of my medical decision making, as well as reviewing the written report by the radiologist.  ED MD interpretation:   Official radiology report(s): No results found.  ____________________________________________   PROCEDURES  Procedure(s) performed (including Critical Care): Critical care time 45 minutes.  I been in the patient's room at least 3 times to talk to him and his wife.  Additionally of been texting  with Dr. Bernardo Heater and spoke with the nurse practitioner via text as well as discussing the patient with Dr. Tobie Poet the hospitalist.  Patient's last heart rate was considerably higher than 35 his blood pressure is trending upwards now.  He is on his second liter of fluid and has had  his Maxipime already.  Procedures   ____________________________________________   INITIAL IMPRESSION / ASSESSMENT AND PLAN / ED COURSE  ----------------------------------------- 5:32 PM on 11/18/2020 -----------------------------------------  Patient's lactic acid and CBC come back just as I am looking at his CMP.  Patient has acute kidney injury.  His white count is very elevated as is his lactic acid.  I have ordered a second liter of fluids and I will order some Maxipime as well.  Urologist NP has discussed the patient with Dr. Bernardo Heater as soon as I get the CT report back and it confirms our impression of an impacted stone I will give Dr. Bernardo Heater a call.  The patient likely is septic from that.  He will need his stone drain.  He is becoming more bradycardic as well.  Hopefully the fluid will change this.  We will have to involve internal medicine as well.             ____________________________________________   FINAL CLINICAL IMPRESSION(S) / ED DIAGNOSES  Final diagnoses:  Elevated troponin  Hypotension, unspecified hypotension type  Acute kidney injury ([place])  Sepsis, due to unspecified organism, unspecified whether acute organ dysfunction present [facility])     ED Discharge Orders    None      *Please note:  Mare Ludtke was evaluated in Emergency Department on 11/18/2020 for the symptoms described in the history of present illness. He was evaluated in the context of the global COVID-19 pandemic, which necessitated consideration that the patient might be at risk for infection with the SARS-CoV-2 virus that causes COVID-19. Institutional protocols and algorithms that pertain to the evaluation of patients  at risk for COVID-19 are in a state of rapid change based on information released by regulatory bodies including the CDC and federal and state organizations. These policies and algorithms were followed during the patient's care in the ED.  Some ED evaluations and interventions may be delayed as a result of limited staffing during and the pandemic.*   Note:  This document was prepared using Dragon voice recognition software and may include unintentional dictation errors.    Nena Polio, MD 11/18/20 1757

## 2020-11-18 NOTE — ED Notes (Signed)
Per the patient and spouse, may update Brewing technologist; grand-daughter with info at [phone].

## 2020-11-18 NOTE — H&P (Addendum)
History and Physical   [name] FXT:[id number] DOB: Feb 07, 1941 DOA: 11/18/2020  PCP: [name], MD  Outpatient Specialists: [facility] Patient coming from: home   I have personally briefly reviewed patient's old medical records in [place].  Chief Concern: right flank pain  HPI: [name] is a 79 y.o. male with medical history significant for symptomatic pvcs, left URS/LL/uretral stent placement and with stent removal in 10/2004, right ESWL in 12/2014, presented to the ED for chief concern of right flank pain.  Right flank pain that started yesterday when he was sitting and watching television.  He denies nausea, vomiting, chest pain, shortness of breath. He endorses dysuria. He denies hematuria, diarrhea, blood in stool. He reports that has not urinated for two days. He reports that has had similar episode before and the previous time, he did not urinate for 11 days. He states he knows to not wait that along this time and only waited two days.   Social history: lives with spouse. He is retired and formerly worked on Public relations account executive, he denies tobbacco use, etoh, and recreational drug use.   Vaccinations: he endorses two covid vaccinations with [employer]  ED Course: discussed with ED provider. Admit for urosepsis. Urology consulted and patient will be taken to OR today for right sided cystoscopy with stent placement.   He received NS 1 L bolus and LR 1 L bolus and cefepime IV per ED provider.   Review of Systems: As per HPI otherwise 10 point review of systems negative.  Assessment/Plan  Principal Problem:   Sepsis associated hypotension (HCC) Active Problems:   BPH with urinary obstruction   Aorto-iliac atherosclerosis (HCC)   HLD (hyperlipidemia)   Fatigue   Anxiety   Sepsis (HCC)   Severe Sepsis - with organ dysfunction, AKI, with hypotension Leukocytosis  - Lactic acid 5.6, WBC 32.3, hypotension with map of 60 -Urology consulted -CT  renal stone study completed -Status post bolus fluids with minimal blood pressure response -Status post cefepime - UA resulted showing large leukocytes and negative nitrite - Blood cx x2 and urine culture in process -N.p.o. -Patient will be taken to the OR for right-sided cystoscopy with stent placement - Levophed gtt started and consulted critical care service - Dr. Mortimer Fries, intensivist recommends additional LR 1 L bolus and states he will follow patient  - Orders placed for ICU/Step down  AKI - secondary to severe sepsis - bmp in the AM Metabolic acidosis - secondary to aki  Hypertension-holding home metoprolol succinate 25 mg daily due to hypotension  BPH-holding finasteride 5 mg daily, tamsulosin capsule daily due to hypotension  Hyperlipidemia  Chart reviewed.   Called OR and OR is ready for surgery pending COVID result. I called lab and COVID test is actively being run and will anticipate to result at 7:20/7:25 PM. Updated family.   At bedside with levophed and third LR bolus running, patient has map of 69-72. Patient remains in stable, awake and alert.  DVT prophylaxis: Holding due to surgery Code Status: full code  Diet: npo for urologic procedure Family Communication: updated spouse at bedside Disposition Plan: pending clinical course Consults called: urology Admission status: observation to progressive  Past Medical History:  Diagnosis Date  . Actinic keratosis   . BPH with urinary obstruction    sees urology Dr. [name]  . Carotid stenosis 07/20/2016   Minimal on [place] 07/2016. Monitor clinically.  . Depression with anxiety   . Elevated PSA 2014   sees urology -  normal biopsies x 3  . History of kidney stones   . HLD (hyperlipidemia)   . Left kidney mass 09/2014   on CT scan thought consistent with simple cyst on prior US  . Nasal obstruction    congenital R sided  . Obstructive pyelonephritis 09/2014   s/p hospitalization and treatment with levaquin  . OSA  (obstructive sleep apnea)    pt denies  . Osteoporosis 06/04/2018   DEXA T score -37 at L spine  . Recurrent kidney stones 2014   extensive bilateral nephrolithiasis, sees urology [name]), hydrocodone prn  . Squamous cell carcinoma of skin 07/25/2019   crown scalp  . Squamous cell carcinoma of skin 06/29/2017   SCC IS ant to crown post  . Symptomatic PVCs    per prior cardiologist, on beta blocker  . Urge incontinence    thought overactive bladder   Past Surgical History:  Procedure Laterality Date  . CARDIAC CATHETERIZATION  03/2012   EF 45-50%, no LVH, + impaired LV relaxation, no PH, no valve abnormalities  . CARDIOVASCULAR STRESS TEST  03/2012   mild peri infarct ischemia in apical segment, decreased EF  . CATARACT EXTRACTION Right 01/2013  . COLONOSCOPY  02/2008   mod diverticulosis o/w WNL (2009)  . CYSTOSCOPY    . CYSTOSCOPY  10/2014   s/p stone removal, with stent removal [name])  . CYSTOSCOPY/URETEROSCOPY/HOLMIUM LASER/STENT PLACEMENT Left 08/11/2020   Procedure: CYSTOSCOPY/URETEROSCOPY/STENT PLACEMENT;  Surgeon: Hollice Espy, MD;  Location: [facility] ORS;  Service: Urology;  Laterality: Left;  . CYSTOSCOPY/URETEROSCOPY/HOLMIUM LASER/STENT PLACEMENT N/A 08/28/2020   Procedure: CYSTOSCOPY/URETEROSCOPY/HOLMIUM LASER/STENT exchange;  Surgeon: Hollice Espy, MD;  Location: [facility] ORS;  Service: Urology;  Laterality: N/A;  . HOLEP-LASER ENUCLEATION OF THE PROSTATE WITH MORCELLATION N/A 08/11/2020   Procedure: HOLEP-LASER ENUCLEATION OF THE PROSTATE WITH MORCELLATION;  Surgeon: Hollice Espy, MD;  Location: [facility] ORS;  Service: Urology;  Laterality: N/A;  . TONSILECTOMY, ADENOIDECTOMY, BILATERAL MYRINGOTOMY AND TUBES     removed as a child  . US ECHOCARDIOGRAPHY  03/2012   hypokinetic anterior wall, EF 45-50%, impaired relaxation pattern, mild LA dilation, mild-mod MR, mild-mod AR   Social History:  reports that he has never smoked. He has never used smokeless tobacco. He reports that  he does not drink alcohol and does not use drugs.  No Known Allergies Family History  Problem Relation Age of Onset  . Urolithiasis Daughter   . Kidney Stones Mother   . Diabetes Neg Hx   . Cancer Neg Hx   . CAD Neg Hx   . Stroke Neg Hx   . Hypertension Neg Hx   . Prostate cancer Neg Hx   . Kidney cancer Neg Hx   . Bladder Cancer Neg Hx    Family history: Family history reviewed and not pertinent  Prior to Admission medications   Medication Sig Start Date End Date Taking? Authorizing Provider  Calcium Carb-Cholecalciferol (CALCIUM 600+D3 PO) Take 1 tablet by mouth daily with breakfast.     [provider]  ciprofloxacin (CIPRO) 500 MG tablet Take 1 tablet (500 mg total) by mouth every 12 (twelve) hours. 08/26/20   Hollice Espy, MD  finasteride (PROSCAR) 5 MG tablet Take 1 tablet (5 mg total) by mouth daily. 07/22/20   [name] A, [name]  fluconazole (DIFLUCAN) 200 MG tablet Take 1 tablet (200 mg total) by mouth once a week. Take 1 pill a week 03/26/20   Ralene Bathe, MD  metoprolol succinate (TOPROL-XL) 25 MG 24 hr  tablet TAKE 1/2 TABLET EVERY DAY Patient taking differently: Take 12.5 mg by mouth daily with breakfast.  10/12/19   [name], MD  Multiple Vitamin (MULTIVITAMIN WITH MINERALS) TABS tablet Take 1 tablet by mouth daily with breakfast.     [provider]  tamsulosin (FLOMAX) 0.4 MG CAPS capsule Take 1 capsule (0.4 mg total) by mouth daily. 07/15/20   Nori Riis, [name]   Physical Exam: Vitals:   11/18/20 1700 11/18/20 1730 11/18/20 1745 11/18/20 1800  BP:  (!) 83/53 (!) 92/53 97/67  Pulse:  (!) 35 62 (!) 39  Resp:  (!) 31 (!) 26 18  Temp:      SpO2:   95% 98%  Weight: 56.7 kg     Height: 5\' 6"  (1.676 m)      Constitutional: appears age-appropriate, NAD, calm, mild discomfort Eyes: PERRL, lids and conjunctivae normal ENMT: Mucous membranes are moist. Posterior pharynx clear of any exudate or lesions. Age-appropriate  dentition. Hearing appropriate Neck: normal, supple, no masses, no thyromegaly Respiratory: clear to auscultation bilaterally, no wheezing, no crackles. Normal respiratory effort. No accessory muscle use.  Cardiovascular: Regular rate and rhythm, no murmurs / rubs / gallops. No extremity edema. 2+ pedal pulses. No carotid bruits.  Abdomen: no tenderness, no masses palpated, no hepatosplenomegaly. Bowel sounds positive.  Musculoskeletal: no clubbing / cyanosis. No joint deformity upper and lower extremities. Good ROM, no contractures, no atrophy. Normal muscle tone.  Skin: no rashes, lesions, ulcers. No induration Neurologic: Sensation intact. Strength 5/5 in all 4.  Psychiatric: Normal judgment and insight. Alert and oriented x 3. Normal mood.   EKG: Independently reviewed, showing atrial fibrillation, rate of 89, QTc 428  Abdominal x-ray on Admission: Personally reviewed and I agree with radiologist reading as below.  DG OR UROLOGY CYSTO IMAGE ([facility] ONLY)  Result Date: 11/18/2020 There is no interpretation for this exam.  This order is for images obtained during a surgical procedure.  Please See "Surgeries" Tab for more information regarding the procedure.   Labs on Admission: I have personally reviewed following labs  CBC: Recent Labs  Lab 11/18/20 1635  WBC 32.3*  NEUTROABS 29.1*  HGB 13.9  HCT 42.2  MCV 90.2  PLT 269*   Basic Metabolic Panel: Recent Labs  Lab 11/18/20 1635  NA 139  K 3.7  CL 104  CO2 18*  GLUCOSE 138*  BUN 35*  CREATININE 3.43*  CALCIUM 8.7*   GFR: Estimated Creatinine Clearance: 13.5 mL/min (A) (by C-G formula based on SCr of 3.43 mg/dL (H)). Liver Function Tests: Recent Labs  Lab 11/18/20 1635  AST 39  ALT 13  ALKPHOS 60  BILITOT 1.0  PROT 7.1  ALBUMIN 3.5   Recent Labs  Lab 11/18/20 1635  LIPASE 18   Urine analysis:    Component Value Date/Time   COLORURINE STRAW (A) 11/18/2020 1635   APPEARANCEUR CLOUDY (A) 11/18/2020 1635    APPEARANCEUR Cloudy (A) 08/21/2020 1148   LABSPEC 1.004 (L) 11/18/2020 1635   LABSPEC 1.025 10/07/2014 1927   PHURINE 5.0 11/18/2020 1635   GLUCOSEU NEGATIVE 11/18/2020 1635   GLUCOSEU Negative 10/07/2014 1927   HGBUR LARGE (A) 11/18/2020 1635   BILIRUBINUR NEGATIVE 11/18/2020 1635   BILIRUBINUR Negative 08/21/2020 1148   BILIRUBINUR Negative 10/07/2014 1927   KETONESUR NEGATIVE 11/18/2020 1635   PROTEINUR NEGATIVE 11/18/2020 1635   NITRITE NEGATIVE 11/18/2020 1635   LEUKOCYTESUR LARGE (A) 11/18/2020 1635   LEUKOCYTESUR Negative 10/07/2014 1927   Critical care  Performed by: Rupert Stacks, DO Advised by: Rupert Stacks, DO   Critical care provider statement Critical care time: 49   Critical care was necessary to treat or prevent imminent or life-threatening deterioration of the following conditions: severe sepsis and hypotension   Critical care time was spent personally by me on the following activities: Discussion with consultants, evaluation of patient's response to treatment, re-evaluation of patient's response to treatment, examination of patient, ordering and performing treatments and interventions, ordering and review of laboratory studies, ordering and reviewing of radiographic studies, pulse oximetry, reevaluation of patient's condition, obtaining history from patient and review of old charts.  Including review of EKG in the emergency department and previous EKG.  Doak Mah N Harlon Kutner D.O. [facility]  If 12AM-7AM, please contact overnight-coverage provider If 7AM-7PM, please contact day coverage provider [email or web address]  11/18/2020, 6:33 PM

## 2020-11-18 NOTE — Op Note (Signed)
Preoperative diagnosis:  1. Obstructing right distal ureteral calculus 2. Septic shock  Postoperative diagnosis:  1. Same  Procedure:  1. Cystoscopy 2. Right ureteral stent placement (6FR/24 cm) 3. Right retrograde pyelography with interpretation  Surgeon: Nicki Reaper C. Exavior Kimmons, M.D.  Anesthesia: MAC  Complications: None  Intraoperative findings:  1.  Mild hydronephrosis/hydroureter to the distal ureter 2.  Purulent urine aspirated from right renal pelvis  EBL: Minimal  Specimens: C&S urine from right renal pelvis  Indication: [name] is a 79 y.o. patient with an obstructing right ureteral calculus with septic shock requiring IV pressors. After reviewing the management options for treatment, he elected to proceed with the above surgical procedure(s). We have discussed the potential benefits and risks of the procedure, side effects of the proposed treatment, the likelihood of the patient achieving the goals of the procedure, and any potential problems that might occur during the procedure or recuperation. Informed consent has been obtained.  Description of procedure:  The patient was taken to the operating room and IV sedation was obtained.  The patient was placed in the dorsal lithotomy position, prepped and draped in the usual sterile fashion, and preoperative antibiotics were administered. A preoperative time-out was performed.  Intraurethral lidocaine was instilled.  A 21 French cystoscope was lubricated and passed per urethra.  The urethra was normal in caliber stricture.  Prostatic fossa was open s/p HoLEP.  Purulent appearing material was in the bladder.  No solid or papillary bladder mucosal lesions were identified.  A 0.038 Sensor guidewire was then advanced up theureter into the renal pelvis under fluoroscopic guidance.  A 5 French ureteral catheter was then advanced over the wire to the region of the renal pelvis.  The guidewire was removed and purulent urine was  aspirated and sent for culture.  Retrograde pyelogram was then performed through the ureteral catheter with findings as described above.  The guidewire was replaced and ureteral catheter was removed.  A 6FR/24 cm Contour ureteral stent was then advanced over the guidewire without difficulty. The stent was positioned appropriately under fluoroscopic and cystoscopic guidance with the proximal tip in an upper pole calyx.    The bladder was then emptied and purulent urine was noted to be draining through the stent.  The cystoscope was removed.  A Foley catheter was placed to gravity drainage to achieve maximal drainage.  The patient appeared to tolerate the procedure well and without complications.  He was transferred to the PACU in stable condition.  Plan:  Continue Foley catheter drainage 24-48 hours  He will require definitive stone treatment after discharge   Douglas Giovanni, MD

## 2020-11-18 NOTE — OR Nursing (Signed)
Dr. Lubertha Basque notified of Lactic acid and Troponin changes.

## 2020-11-18 NOTE — Progress Notes (Signed)
09/17/2020 4:29 PM   [name] 1941-10-10 [id number]  Referring provider: [name], MD [address] [place],  [place] [address] Chief Complaint  Patient presents with  . Flank Pain    HPI: [name] is a 79 y.o. male with a urological history of nephrolithiasis, BPH with LU TS and elevated PSA who presents today with his wife, [name], for an urgent appointment for unable to urinate for 2 days, right-sided flank pain and nausea.  Upon arriving to the office, patient was lethargic and slumped over in the wheelchair.  He is alert enough to answer questions, but I obtained the history through his wife as he is clearly not feeling well.  His wife states that he confessed to her earlier today that he had not urinated in 2 days.  She stated she tried to convince him to go to the emergency room last night, but he refused.  When he got up this morning, she felt that he was somewhat better as he ate some cereal but as the day progressed he clinically became worse.  She denied any fever or chills, but she states he slept with multiple blankets last night.  He has been having nausea, but she has not noted any vomiting.  KUB shows 2 suspicious calcifications in the right lower ureter for stones.  Patient was unable to give urine sample in the office.  His PVR was 48 mL.   PMH: Past Medical History:  Diagnosis Date  . Actinic keratosis   . BPH with urinary obstruction    sees urology Dr. [name]  . Carotid stenosis 07/20/2016   Minimal on [place] 07/2016. Monitor clinically.  . Depression with anxiety   . Elevated PSA 2014   sees urology - normal biopsies x 3  . History of kidney stones   . HLD (hyperlipidemia)   . Left kidney mass 09/2014   on CT scan thought consistent with simple cyst on prior US  . Nasal obstruction    congenital R sided  . Obstructive pyelonephritis 09/2014   s/p hospitalization and treatment with levaquin  . OSA (obstructive sleep apnea)    pt denies   . Osteoporosis 06/04/2018   DEXA T score -37 at L spine  . Recurrent kidney stones 2014   extensive bilateral nephrolithiasis, sees urology [name]), hydrocodone prn  . Squamous cell carcinoma of skin 07/25/2019   crown scalp  . Squamous cell carcinoma of skin 06/29/2017   SCC IS ant to crown post  . Symptomatic PVCs    per prior cardiologist, on beta blocker  . Urge incontinence    thought overactive bladder    Surgical History: Past Surgical History:  Procedure Laterality Date  . CARDIAC CATHETERIZATION  03/2012   EF 45-50%, no LVH, + impaired LV relaxation, no PH, no valve abnormalities  . CARDIOVASCULAR STRESS TEST  03/2012   mild peri infarct ischemia in apical segment, decreased EF  . CATARACT EXTRACTION Right 01/2013  . COLONOSCOPY  02/2008   mod diverticulosis o/w WNL (2009)  . CYSTOSCOPY    . CYSTOSCOPY  10/2014   s/p stone removal, with stent removal [name])  . CYSTOSCOPY/URETEROSCOPY/HOLMIUM LASER/STENT PLACEMENT Left 08/11/2020   Procedure: CYSTOSCOPY/URETEROSCOPY/STENT PLACEMENT;  Surgeon: Hollice Espy, MD;  Location: [facility] ORS;  Service: Urology;  Laterality: Left;  . CYSTOSCOPY/URETEROSCOPY/HOLMIUM LASER/STENT PLACEMENT N/A 08/28/2020   Procedure: CYSTOSCOPY/URETEROSCOPY/HOLMIUM LASER/STENT exchange;  Surgeon: Hollice Espy, MD;  Location: [facility] ORS;  Service: Urology;  Laterality: N/A;  . HOLEP-LASER ENUCLEATION OF  THE PROSTATE WITH MORCELLATION N/A 08/11/2020   Procedure: HOLEP-LASER ENUCLEATION OF THE PROSTATE WITH MORCELLATION;  Surgeon: Hollice Espy, MD;  Location: [facility] ORS;  Service: Urology;  Laterality: N/A;  . TONSILECTOMY, ADENOIDECTOMY, BILATERAL MYRINGOTOMY AND TUBES     removed as a child  . US ECHOCARDIOGRAPHY  03/2012   hypokinetic anterior wall, EF 45-50%, impaired relaxation pattern, mild LA dilation, mild-mod MR, mild-mod AR    Home Medications:  Allergies as of 11/18/2020   No Known Allergies     Medication List       Accurate as of  November 18, 2020  4:29 PM. If you have any questions, ask your nurse or doctor.        STOP taking these medications   HYDROcodone-acetaminophen 5-325 MG tablet Commonly known as: NORCO/VICODIN Stopped by: [name], [name]     TAKE these medications   CALCIUM 600+D3 PO Take 1 tablet by mouth daily with breakfast.   ciprofloxacin 500 MG tablet Commonly known as: CIPRO Take 1 tablet (500 mg total) by mouth every 12 (twelve) hours.   finasteride 5 MG tablet Commonly known as: Proscar Take 1 tablet (5 mg total) by mouth daily.   fluconazole 200 MG tablet Commonly known as: DIFLUCAN Take 1 tablet (200 mg total) by mouth once a week. Take 1 pill a week   metoprolol succinate 25 MG 24 hr tablet Commonly known as: TOPROL-XL TAKE 1/2 TABLET EVERY DAY What changed: when to take this   multivitamin with minerals Tabs tablet Take 1 tablet by mouth daily with breakfast.   tamsulosin 0.4 MG Caps capsule Commonly known as: FLOMAX Take 1 capsule (0.4 mg total) by mouth daily.       Allergies: No Known Allergies  Family History: Family History  Problem Relation Age of Onset  . Urolithiasis Daughter   . Kidney Stones Mother   . Diabetes Neg Hx   . Cancer Neg Hx   . CAD Neg Hx   . Stroke Neg Hx   . Hypertension Neg Hx   . Prostate cancer Neg Hx   . Kidney cancer Neg Hx   . Bladder Cancer Neg Hx     Social History:  reports that he has never smoked. He has never used smokeless tobacco. He reports that he does not drink alcohol and does not use drugs.   Physical Exam: BP (!) 77/52   Pulse (!) 52   Temp (!) 97.4 F (36.3 C)   Ht 5\' 6"  (1.676 m)   Wt 148 lb (67.1 kg)   BMI 23.89 kg/m   Constitutional:  Lethargic.  Able to answer questions.  In distress.   HEENT: [place] AT, dry mucus membranes.  Trachea midline Cardiovascular: No clubbing, cyanosis, or edema. Respiratory: Normal respiratory effort, no increased work of breathing. GI: Abdomen is soft, non tender, non  distended, no abdominal masses.  GU: Right CVA tenderness.  No bladder fullness or masses.   Neurologic: Grossly intact, no focal deficits, moving all 4 extremities. Psychiatric: Lethargic.    Laboratory Data:  Lab Results  Component Value Date   CREATININE 1.33 (H) 07/23/2020     Pertinent Imaging: Results for orders placed or performed in visit on 11/18/20  Bladder Scan (Post Void Residual) in office  Result Value Ref Range   Scan Result 48    Narrative & Impression  CLINICAL DATA:  Right-sided kidney stone unable to urinate  EXAM: ABDOMEN - 1 VIEW  COMPARISON:  07/15/2020, CT 11/18/2020  FINDINGS:  Nonobstructed gas pattern. Probable scarring at the right lung base. Multiple faint calcifications projecting over bilateral kidneys consistent with renal calculi. 4 mm calcification in the right pelvis may correspond to the CT demonstrated right ureteral stone.  IMPRESSION: 1. 4 mm calcification in the right pelvis likely corresponds to CT demonstrated distal ureteral stone. 2. Bilateral nephrolithiasis.   Electronically Signed   By: Donavan Foil M.D.   On: 11/18/2020 17:58   I have independently reviewed the films.  See HPI.    Assessment & Plan:    1.  Oliguria Patient is hypotensive and tachycardic on presentation to our office Bladder scan only noted 48 cc in the bladder Today's KUB is suspicious for a right distal ureteral stones Concern for sepsis in the presence of obstructing right ureteral stones Patient is transferred to the ED for further work up and care   [place] [address] [place], [place] [address] [phone]   [name], [name]

## 2020-11-19 DIAGNOSIS — A419 Sepsis, unspecified organism: Secondary | ICD-10-CM | POA: Diagnosis not present

## 2020-11-19 DIAGNOSIS — Z79899 Other long term (current) drug therapy: Secondary | ICD-10-CM | POA: Diagnosis not present

## 2020-11-19 DIAGNOSIS — I708 Atherosclerosis of other arteries: Secondary | ICD-10-CM | POA: Diagnosis present

## 2020-11-19 DIAGNOSIS — I700 Atherosclerosis of aorta: Secondary | ICD-10-CM | POA: Diagnosis present

## 2020-11-19 DIAGNOSIS — Z85828 Personal history of other malignant neoplasm of skin: Secondary | ICD-10-CM | POA: Diagnosis not present

## 2020-11-19 DIAGNOSIS — B961 Klebsiella pneumoniae [K. pneumoniae] as the cause of diseases classified elsewhere: Secondary | ICD-10-CM | POA: Diagnosis not present

## 2020-11-19 DIAGNOSIS — E785 Hyperlipidemia, unspecified: Secondary | ICD-10-CM | POA: Diagnosis present

## 2020-11-19 DIAGNOSIS — R778 Other specified abnormalities of plasma proteins: Secondary | ICD-10-CM | POA: Diagnosis present

## 2020-11-19 DIAGNOSIS — N401 Enlarged prostate with lower urinary tract symptoms: Secondary | ICD-10-CM | POA: Diagnosis present

## 2020-11-19 DIAGNOSIS — N201 Calculus of ureter: Secondary | ICD-10-CM | POA: Diagnosis not present

## 2020-11-19 DIAGNOSIS — R7881 Bacteremia: Secondary | ICD-10-CM | POA: Diagnosis not present

## 2020-11-19 DIAGNOSIS — I129 Hypertensive chronic kidney disease with stage 1 through stage 4 chronic kidney disease, or unspecified chronic kidney disease: Secondary | ICD-10-CM | POA: Diagnosis present

## 2020-11-19 DIAGNOSIS — A4159 Other Gram-negative sepsis: Secondary | ICD-10-CM | POA: Diagnosis present

## 2020-11-19 DIAGNOSIS — Z9841 Cataract extraction status, right eye: Secondary | ICD-10-CM | POA: Diagnosis not present

## 2020-11-19 DIAGNOSIS — Z23 Encounter for immunization: Secondary | ICD-10-CM | POA: Diagnosis present

## 2020-11-19 DIAGNOSIS — N189 Chronic kidney disease, unspecified: Secondary | ICD-10-CM | POA: Diagnosis not present

## 2020-11-19 DIAGNOSIS — G4733 Obstructive sleep apnea (adult) (pediatric): Secondary | ICD-10-CM | POA: Diagnosis present

## 2020-11-19 DIAGNOSIS — N132 Hydronephrosis with renal and ureteral calculous obstruction: Secondary | ICD-10-CM | POA: Diagnosis present

## 2020-11-19 DIAGNOSIS — I493 Ventricular premature depolarization: Secondary | ICD-10-CM | POA: Diagnosis present

## 2020-11-19 DIAGNOSIS — Z87442 Personal history of urinary calculi: Secondary | ICD-10-CM | POA: Diagnosis not present

## 2020-11-19 DIAGNOSIS — N390 Urinary tract infection, site not specified: Secondary | ICD-10-CM | POA: Diagnosis not present

## 2020-11-19 DIAGNOSIS — N179 Acute kidney failure, unspecified: Secondary | ICD-10-CM | POA: Diagnosis present

## 2020-11-19 DIAGNOSIS — F32A Depression, unspecified: Secondary | ICD-10-CM | POA: Diagnosis present

## 2020-11-19 DIAGNOSIS — J150 Pneumonia due to Klebsiella pneumoniae: Secondary | ICD-10-CM | POA: Diagnosis present

## 2020-11-19 DIAGNOSIS — I959 Hypotension, unspecified: Secondary | ICD-10-CM | POA: Diagnosis not present

## 2020-11-19 DIAGNOSIS — F419 Anxiety disorder, unspecified: Secondary | ICD-10-CM | POA: Diagnosis present

## 2020-11-19 DIAGNOSIS — R6521 Severe sepsis with septic shock: Secondary | ICD-10-CM | POA: Diagnosis present

## 2020-11-19 DIAGNOSIS — Z20822 Contact with and (suspected) exposure to covid-19: Secondary | ICD-10-CM | POA: Diagnosis present

## 2020-11-19 LAB — BLOOD CULTURE ID PANEL (REFLEXED) - BCID2

## 2020-11-19 LAB — CBC
HCT: 34.7 % — ABNORMAL LOW (ref 39.0–52.0)
Hemoglobin: 11.6 g/dL — ABNORMAL LOW (ref 13.0–17.0)
MCH: 29.8 pg (ref 26.0–34.0)
MCHC: 33.4 g/dL (ref 30.0–36.0)
MCV: 89.2 fL (ref 80.0–100.0)
Platelets: 78 10*3/uL — ABNORMAL LOW (ref 150–400)
RBC: 3.89 MIL/uL — ABNORMAL LOW (ref 4.22–5.81)
RDW: 13.8 % (ref 11.5–15.5)
WBC: 24.8 10*3/uL — ABNORMAL HIGH (ref 4.0–10.5)
nRBC: 0 % (ref 0.0–0.2)

## 2020-11-19 LAB — BASIC METABOLIC PANEL
Anion gap: 11 (ref 5–15)
BUN: 38 mg/dL — ABNORMAL HIGH (ref 8–23)
CO2: 20 mmol/L — ABNORMAL LOW (ref 22–32)
Calcium: 7.8 mg/dL — ABNORMAL LOW (ref 8.9–10.3)
Chloride: 111 mmol/L (ref 98–111)
Creatinine, Ser: 2.34 mg/dL — ABNORMAL HIGH (ref 0.61–1.24)
GFR, Estimated: 27 mL/min — ABNORMAL LOW (ref >60)
Glucose, Bld: 114 mg/dL — ABNORMAL HIGH (ref 70–99)
Potassium: 4.2 mmol/L (ref 3.5–5.1)
Sodium: 142 mmol/L (ref 135–145)

## 2020-11-19 LAB — MAGNESIUM: Magnesium: 1.6 mg/dL — ABNORMAL LOW (ref 1.7–2.4)

## 2020-11-19 MED ORDER — LACTATED RINGERS IV BOLUS
1000.0000 mL | Freq: Once | INTRAVENOUS | Status: AC
  Administered 2020-11-19: 1000 mL via INTRAVENOUS

## 2020-11-19 MED ORDER — SODIUM CHLORIDE 0.9 % IV SOLN
2.0000 g | INTRAVENOUS | Status: DC
  Administered 2020-11-19 – 2020-11-22 (×4): 2 g via INTRAVENOUS
  Filled 2020-11-19: qty 20
  Filled 2020-11-19 (×2): qty 2
  Filled 2020-11-19: qty 20

## 2020-11-19 MED ORDER — TRAZODONE HCL 50 MG PO TABS
50.0000 mg | ORAL_TABLET | Freq: Every evening | ORAL | Status: DC | PRN
  Administered 2020-11-19 – 2020-11-20 (×2): 50 mg via ORAL
  Filled 2020-11-19 (×2): qty 1

## 2020-11-19 NOTE — Progress Notes (Signed)
PHARMACY - PHYSICIAN COMMUNICATION CRITICAL VALUE ALERT - BLOOD CULTURE IDENTIFICATION (BCID)  [name] is an 79 y.o. male who presented to [facility] on 11/18/2020 with a chief complaint of weakness  Assessment:  Lab reports 4 of 4 bottles + for [place]  Name of physician (or Provider) ContactedRachael Fee NP  Current antibiotics: Cefepime  Changes to prescribed antibiotics recommended: switch to Rocephin 2gm IV q24hrs Recommendations accepted by provider  Results for orders placed or performed during the hospital encounter of 11/18/20  Blood Culture ID Panel (Reflexed) (Collected: 11/18/2020  4:35 PM)  Result Value Ref Range   Enterococcus faecalis NOT DETECTED NOT DETECTED   Enterococcus Faecium NOT DETECTED NOT DETECTED   Listeria monocytogenes NOT DETECTED NOT DETECTED   Staphylococcus species NOT DETECTED NOT DETECTED   Staphylococcus aureus (BCID) NOT DETECTED NOT DETECTED   Staphylococcus epidermidis NOT DETECTED NOT DETECTED   Staphylococcus lugdunensis NOT DETECTED NOT DETECTED   Streptococcus species NOT DETECTED NOT DETECTED   Streptococcus agalactiae NOT DETECTED NOT DETECTED   Streptococcus pneumoniae NOT DETECTED NOT DETECTED   Streptococcus pyogenes NOT DETECTED NOT DETECTED   A.calcoaceticus-baumannii NOT DETECTED NOT DETECTED   Bacteroides fragilis NOT DETECTED NOT DETECTED   Enterobacterales DETECTED (A) NOT DETECTED   Enterobacter cloacae complex NOT DETECTED NOT DETECTED   Escherichia coli NOT DETECTED NOT DETECTED   Klebsiella aerogenes NOT DETECTED NOT DETECTED   Klebsiella oxytoca NOT DETECTED NOT DETECTED   Klebsiella pneumoniae DETECTED (A) NOT DETECTED   Proteus species NOT DETECTED NOT DETECTED   Salmonella species NOT DETECTED NOT DETECTED   Serratia marcescens NOT DETECTED NOT DETECTED   Haemophilus influenzae NOT DETECTED NOT DETECTED   Neisseria meningitidis NOT DETECTED NOT DETECTED   Pseudomonas aeruginosa NOT DETECTED NOT DETECTED    Stenotrophomonas maltophilia NOT DETECTED NOT DETECTED   Candida albicans NOT DETECTED NOT DETECTED   Candida auris NOT DETECTED NOT DETECTED   Candida glabrata NOT DETECTED NOT DETECTED   Candida krusei NOT DETECTED NOT DETECTED   Candida parapsilosis NOT DETECTED NOT DETECTED   Candida tropicalis NOT DETECTED NOT DETECTED   Cryptococcus neoformans/gattii NOT DETECTED NOT DETECTED   CTX-M ESBL NOT DETECTED NOT DETECTED   Carbapenem resistance IMP NOT DETECTED NOT DETECTED   Carbapenem resistance KPC NOT DETECTED NOT DETECTED   Carbapenem resistance NDM NOT DETECTED NOT DETECTED   Carbapenem resist OXA 48 LIKE NOT DETECTED NOT DETECTED   Carbapenem resistance VIM NOT DETECTED NOT DETECTED    Hart Robinsons A 11/19/2020  5:05 AM

## 2020-11-19 NOTE — Anesthesia Postprocedure Evaluation (Signed)
Anesthesia Post Note  Patient: [name]  Procedure(s) Performed: CYSTOSCOPY WITH STENT PLACEMENT (Right )  Patient location during evaluation: ICU Anesthesia Type: General Level of consciousness: awake and awake and alert Pain management: pain level controlled Vital Signs Assessment: post-procedure vital signs reviewed and stable Respiratory status: spontaneous breathing, respiratory function stable and nonlabored ventilation Cardiovascular status: blood pressure returned to baseline and stable Postop Assessment: no headache and no backache Anesthetic complications: no   No complications documented.   Last Vitals:  Vitals:   11/19/20 0700 11/19/20 0715  BP: (!) 109/55 108/69  Pulse: 78 80  Resp: 17 (!) 28  Temp:  36.8 C  SpO2: 93% 95%    Last Pain:  Vitals:   11/19/20 0715  TempSrc: Oral  PainSc: 0-No pain                 Johnna Acosta

## 2020-11-19 NOTE — Progress Notes (Signed)
Urology Consult Follow Up  Subjective: Postop day 1 of a emergent right ureteral stent placement due to obstructing right renal stones in the setting of sepsis  Patient more alert this morning, but somewhat delirious.    Levo down to 2 from 6 this morning.    Afebrile.  Still tachypneic.  BP stable.    Urine output improving.  Foley in place draining yellow urine.   Preliminary blood cultures positive for Klebsiella pneumoniae. Serum creatinine trending downwards from 3.43 yesterday to 2.34 this morning. WBC count trending downward from 32.3 yesterday to 24.8 this morning.    Anti-infectives: Anti-infectives (From admission, onward)   Start     Dose/Rate Route Frequency Ordered Stop   11/19/20 1000  ceFEPIme (MAXIPIME) 2 g in sodium chloride 0.9 % 100 mL IVPB  Status:  Discontinued        2 g 200 mL/hr over 30 Minutes Intravenous Every 24 hours 11/18/20 2344 11/19/20 0517   11/19/20 0800  cefTRIAXone (ROCEPHIN) 2 g in sodium chloride 0.9 % 100 mL IVPB        2 g 200 mL/hr over 30 Minutes Intravenous Every 24 hours 11/19/20 0518     11/18/20 1730  ceFEPIme (MAXIPIME) 1 g in sodium chloride 0.9 % 100 mL IVPB        1 g 200 mL/hr over 30 Minutes Intravenous  Once 11/18/20 1721 11/18/20 1800      Current Facility-Administered Medications  Medication Dose Route Frequency Provider Last Rate Last Admin  . cefTRIAXone (ROCEPHIN) 2 g in sodium chloride 0.9 % 100 mL IVPB  2 g Intravenous Q24H Hart Robinsons A, RPH   Stopped at 11/19/20 6811  . Chlorhexidine Gluconate Cloth 2 % PADS 6 each  6 each Topical Daily Cox, Amy N, DO      . lactated ringers infusion   Intravenous Continuous Sharion Settler, NP 125 mL/hr at 11/19/20 1100 Rate Verify at 11/19/20 1100  . norepinephrine (LEVOPHED) 4mg  in 263mL premix infusion  0-40 mcg/min Intravenous Titrated Cox, Amy N, DO 7.5 mL/hr at 11/19/20 1100 2 mcg/min at 11/19/20 1100     Objective: Vital signs in last 24 hours: Temp:  [97 F (36.1  C)-98.5 F (36.9 C)] 98.2 F (36.8 C) (12/08 0715) Pulse Rate:  [35-118] 76 (12/08 1130) Resp:  [0-36] 26 (12/08 1130) BP: (75-148)/(41-91) 102/59 (12/08 1130) SpO2:  [89 %-99 %] 93 % (12/08 1130) Weight:  [56.7 kg-67.1 kg] 56.7 kg (12/07 1700)  Intake/Output from previous day: 12/07 0701 - 12/08 0700 In: 5814.7 [I.V.:1764.7; IV Piggyback:4050] Out: 620 [Urine:620] Intake/Output this shift: Total I/O In: 1153.8 [P.O.:480; I.V.:573.7; IV Piggyback:100.1] Out: 405 [Urine:405]   Physical Exam Vitals and nursing note reviewed.  Constitutional:      Appearance: Normal appearance.  HENT:     Head: Normocephalic and atraumatic.  Pulmonary:     Effort: Pulmonary effort is normal.  Abdominal:     Palpations: Abdomen is soft.  Skin:    General: Skin is warm and dry.  Neurological:     General: No focal deficit present.     Mental Status: He is alert.  Psychiatric:     Comments: Anxious     Lab Results:  Recent Labs    11/18/20 1635 11/19/20 0624  WBC 32.3* 24.8*  HGB 13.9 11.6*  HCT 42.2 34.7*  PLT 133* 78*   BMET Recent Labs    11/18/20 1635 11/19/20 0624  NA 139 142  K 3.7 4.2  CL 104  111  CO2 18* 20*  GLUCOSE 138* 114*  BUN 35* 38*  CREATININE 3.43* 2.34*  CALCIUM 8.7* 7.8*   PT/INR Recent Labs    11/18/20 2125  LABPROT 15.8*  INR 1.3*   ABG No results for input(s): PHART, HCO3 in the last 72 hours.  Invalid input(s): PCO2, PO2  Studies/Results: Abdomen 1 view (KUB)  Result Date: 11/18/2020 CLINICAL DATA:  Right-sided kidney stone unable to urinate EXAM: ABDOMEN - 1 VIEW COMPARISON:  07/15/2020, CT 11/18/2020 FINDINGS: Nonobstructed gas pattern. Probable scarring at the right lung base. Multiple faint calcifications projecting over bilateral kidneys consistent with renal calculi. 4 mm calcification in the right pelvis may correspond to the CT demonstrated right ureteral stone. IMPRESSION: 1. 4 mm calcification in the right pelvis likely  corresponds to CT demonstrated distal ureteral stone. 2. Bilateral nephrolithiasis. Electronically Signed   By: Donavan Foil M.D.   On: 11/18/2020 17:58   DG Chest Portable 1 View  Result Date: 11/18/2020 CLINICAL DATA:  Low oxygen saturation EXAM: PORTABLE CHEST 1 VIEW COMPARISON:  None. FINDINGS: Linear scarring or atelectasis at the right base. No consolidation or effusion. Small hiatal hernia. Cardiomediastinal silhouette within normal limits. Aortic atherosclerosis. No pneumothorax. Partially visualized sclerotic lesion in the proximal right humerus IMPRESSION: 1. Linear scarring or atelectasis at the right base. No acute infiltrate or edema. 2. Small hiatal hernia. Electronically Signed   By: Donavan Foil M.D.   On: 11/18/2020 17:53   DG OR UROLOGY CYSTO IMAGE ([facility] ONLY)  Result Date: 11/18/2020 There is no interpretation for this exam.  This order is for images obtained during a surgical procedure.  Please See "Surgeries" Tab for more information regarding the procedure.   CT Renal Stone Study  Result Date: 11/18/2020 CLINICAL DATA:  Flank pain, history of nephrolithiasis. EXAM: CT ABDOMEN AND PELVIS WITHOUT CONTRAST TECHNIQUE: Multidetector CT imaging of the abdomen and pelvis was performed following the standard protocol without IV contrast. COMPARISON:  CT 08/26/2020 FINDINGS: Lower chest: Lung bases are clear. Hepatobiliary: Low-density lesion in the RIGHT hepatic lobe measuring 8 mm unchanged from prior. Multiple gallstones within a noninflamed gallbladder. Pancreas: Pancreas is normal. No ductal dilatation. No pancreatic inflammation. Spleen: Normal spleen Adrenals/urinary tract: Adrenal glands normal. Ten nonobstructing calculi present in the RIGHT kidney ranging size from 1 to 4 mm. There is hydroureter on the RIGHT secondary to obstructing calculus in the distal RIGHT ureter measuring 3 mm on image 73/2. This RIGHT distal ureteral calculus is approximately 3 cm from the vesicoureteral  junction. Similar multiple calcifications in the LEFT kidney. No LEFT ureterolithiasis. No bladder calculi. Foley catheter is positioned within a collapsed bladder. Low-density exophytic lesion posterior to the LEFT kidney likely represents a benign cyst (20 mm, image 35/2). Stomach/Bowel: Moderate hiatal hernia. Stomach, small bowel, appendix, and cecum are normal. The colon and rectosigmoid colon are normal. Vascular/Lymphatic: Abdominal aorta is normal caliber with atherosclerotic calcification. There is no retroperitoneal or periportal lymphadenopathy. No pelvic lymphadenopathy. Reproductive: Prostate unremarkable Other: No free fluid. Musculoskeletal: No aggressive osseous lesion. IMPRESSION: 1. Obstructing calculus in the distal RIGHT ureter with mild hydroureter. 2. Bilateral nephrolithiasis. 3. Foley catheter positioned within a collapsed bladder. 4. Cholelithiasis without evidence cholecystitis. 5. Aortic Atherosclerosis (ICD10-I70.0). Electronically Signed   By: Suzy Bouchard M.D.   On: 11/18/2020 17:48     Assessment and Plan: 79 year old male with a history of nephrolithiasis who presented to the office and was found to be septic in the setting of a right obstructing  stone who underwent emergent right ureteral stent placement yesterday with Dr. John Giovanni.   Recommendations: -Will need two weeks of culture appropriate antibiotics -Continue Foley catheter drainage for the next 24 to 48 hrs -He will require definitive stone treatment after discharge     LOS: 0 days    [facility] 11/19/2020

## 2020-11-19 NOTE — Progress Notes (Signed)
Pt arrived to ICU near 2130. Upon arrival, Levo off, pt drowsy. Shortly after admission, Levo restarted, titrated up to 6. Hospitalist APP made aware, maintenance fluid added (LR @ 125 ml/hr). BP stable at this time on 6 of Levo. Pt remains on 2L [place]. Catheter putting out amber/pink tinged urine (25-50 ml q1 hour). Pt does not complain of pain.   Pt Ox4, intermittently confused to place and situation. Follows all commands, calm and cooperative.   Pt in no acute distress @ this time. Will continue to monitor.

## 2020-11-19 NOTE — Plan of Care (Signed)

## 2020-11-19 NOTE — Progress Notes (Signed)
PROGRESS NOTE    [name]  TKW:[id number] DOB: [birth year] DOA: 11/18/2020 PCP: [name], MD    Brief Narrative:  [name] is a 79 y.o. male with medical history significant for symptomatic pvcs, left URS/LL/uretral stent placement and with stent removal in 10/2004, right ESWL in 12/2014, presented to the ED for chief concern of right flank pain.  12/8-on Levophed overnight, now on low dose  Consultants:   urology  Procedures:   Antimicrobials:   Ceftriaxone   Subjective: Patient complaining of getting no sleep.  Denies any shortness of breath, chest pain, or abdominal pain currently on Levophed this a.m.  Objective: Vitals:   11/19/20 1400 11/19/20 1430 11/19/20 1500 11/19/20 1530  BP: 118/65 104/61 107/64 102/78  Pulse: 74 80 76 77  Resp: (!) 21 (!) 30 (!) 42 (!) 33  Temp:      TempSrc:      SpO2: 93% 91% 93% 95%  Weight:      Height:        Intake/Output Summary (Last 24 hours) at 11/19/2020 1739 Last data filed at 11/19/2020 1430 Gross per 24 hour  Intake 7224.3 ml  Output 1225 ml  Net 5999.3 ml   Filed Weights   11/18/20 1700  Weight: 56.7 kg    Examination:  General exam: Appears calm and comfortable , tired looking Respiratory system: Clear to auscultation. Respiratory effort normal. Cardiovascular system: S1 & S2 heard, RRR. No JVD, murmurs, rubs, gallops or clicks.  Gastrointestinal system: Abdomen is nondistended, soft and nontender.. Normal bowel sounds heard. Central nervous system: Alert and oriented.grossly intact Extremities:no edema Skin:warm, dry Psychiatry: Judgement and insight appear normal. Mood & affect appropriate.     Data Reviewed: I have personally reviewed following labs and imaging studies  CBC: Recent Labs  Lab 11/18/20 1635 11/19/20 0624  WBC 32.3* 24.8*  NEUTROABS 29.1*  --   HGB 13.9 11.6*  HCT 42.2 34.7*  MCV 90.2 89.2  PLT 133* 78*   Basic Metabolic Panel: Recent Labs  Lab 11/18/20 1635  11/19/20 0624  NA 139 142  K 3.7 4.2  CL 104 111  CO2 18* 20*  GLUCOSE 138* 114*  BUN 35* 38*  CREATININE 3.43* 2.34*  CALCIUM 8.7* 7.8*  MG  --  1.6*   GFR: Estimated Creatinine Clearance: 19.9 mL/min (A) (by C-G formula based on SCr of 2.34 mg/dL (H)). Liver Function Tests: Recent Labs  Lab 11/18/20 1635  AST 39  ALT 13  ALKPHOS 60  BILITOT 1.0  PROT 7.1  ALBUMIN 3.5   Recent Labs  Lab 11/18/20 1635  LIPASE 18   No results for input(s): AMMONIA in the last 168 hours. Coagulation Profile: Recent Labs  Lab 11/18/20 2125  INR 1.3*   Cardiac Enzymes: No results for input(s): CKTOTAL, CKMB, CKMBINDEX, TROPONINI in the last 168 hours. BNP (last 3 results) No results for input(s): PROBNP in the last 8760 hours. HbA1C: No results for input(s): HGBA1C in the last 72 hours. CBG: Recent Labs  Lab 11/18/20 2123  GLUCAP 95   Lipid Profile: No results for input(s): CHOL, HDL, LDLCALC, TRIG, CHOLHDL, LDLDIRECT in the last 72 hours. Thyroid Function Tests: No results for input(s): TSH, T4TOTAL, FREET4, T3FREE, THYROIDAB in the last 72 hours. Anemia Panel: No results for input(s): VITAMINB12, FOLATE, FERRITIN, TIBC, IRON, RETICCTPCT in the last 72 hours. Sepsis Labs: Recent Labs  Lab 11/18/20 1635 11/18/20 1827  LATICACIDVEN 5.6* 3.8*    Recent Results (from the past 240 hour(s))  Anaerobic culture     Status: None (Preliminary result)   Collection Time: 11/18/20  4:11 PM   Specimen: PATH Other; Tissue  Result Value Ref Range Status   Specimen Description   Final    URINE, RANDOM Performed at [facility], [address], [place], [place] [address]    Special Requests RIGHT RENAL PELVIS  Final   Gram Stain   Final    ABUNDANT WBC PRESENT, PREDOMINANTLY PMN ABUNDANT GRAM NEGATIVE RODS Performed at [facility], [place] [address], [place], [place] [address]    Culture PENDING  Incomplete   Report Status PENDING  Incomplete  Culture, blood  (routine x 2)     Status: None (Preliminary result)   Collection Time: 11/18/20  4:35 PM   Specimen: BLOOD  Result Value Ref Range Status   Specimen Description BLOOD BLOOD LEFT FOREARM  Final   Special Requests   Final    BOTTLES DRAWN AEROBIC AND ANAEROBIC Blood Culture adequate volume   Culture  Setup Time   Final    GRAM NEGATIVE RODS IN BOTH AEROBIC AND ANAEROBIC BOTTLES Organism ID to follow CRITICAL RESULT CALLED TO, READ BACK BY AND VERIFIED WITH: Hart Robinsons [facility] [id number] 11/19/20 HNM Performed at [facility], [address] [place], [place] [address]    Culture GRAM NEGATIVE RODS  Final   Report Status PENDING  Incomplete  Blood Culture ID Panel (Reflexed)     Status: Abnormal   Collection Time: 11/18/20  4:35 PM  Result Value Ref Range Status   Enterococcus faecalis NOT DETECTED NOT DETECTED Final   Enterococcus Faecium NOT DETECTED NOT DETECTED Final   Listeria monocytogenes NOT DETECTED NOT DETECTED Final   Staphylococcus species NOT DETECTED NOT DETECTED Final   Staphylococcus aureus (BCID) NOT DETECTED NOT DETECTED Final   Staphylococcus epidermidis NOT DETECTED NOT DETECTED Final   Staphylococcus lugdunensis NOT DETECTED NOT DETECTED Final   Streptococcus species NOT DETECTED NOT DETECTED Final   Streptococcus agalactiae NOT DETECTED NOT DETECTED Final   Streptococcus pneumoniae NOT DETECTED NOT DETECTED Final   Streptococcus pyogenes NOT DETECTED NOT DETECTED Final   A.calcoaceticus-baumannii NOT DETECTED NOT DETECTED Final   Bacteroides fragilis NOT DETECTED NOT DETECTED Final   Enterobacterales DETECTED (A) NOT DETECTED Final    Comment: Enterobacterales represent a large order of gram negative bacteria, not a single organism. CRITICAL RESULT CALLED TO, READ BACK BY AND VERIFIED WITH: SCOTT HALL PHARMD [id number] 11/19/20 HNM    Enterobacter cloacae complex NOT DETECTED NOT DETECTED Final   Escherichia coli NOT DETECTED NOT DETECTED Final   Klebsiella  aerogenes NOT DETECTED NOT DETECTED Final   Klebsiella oxytoca NOT DETECTED NOT DETECTED Final   Klebsiella pneumoniae DETECTED (A) NOT DETECTED Final    Comment: CRITICAL RESULT CALLED TO, READ BACK BY AND VERIFIED WITH: Hart Robinsons PHARMD 0459 11/19/20 HNM    Proteus species NOT DETECTED NOT DETECTED Final   Salmonella species NOT DETECTED NOT DETECTED Final   Serratia marcescens NOT DETECTED NOT DETECTED Final   Haemophilus influenzae NOT DETECTED NOT DETECTED Final   Neisseria meningitidis NOT DETECTED NOT DETECTED Final   Pseudomonas aeruginosa NOT DETECTED NOT DETECTED Final   Stenotrophomonas maltophilia NOT DETECTED NOT DETECTED Final   Candida albicans NOT DETECTED NOT DETECTED Final   Candida auris NOT DETECTED NOT DETECTED Final   Candida glabrata NOT DETECTED NOT DETECTED Final   Candida krusei NOT DETECTED NOT DETECTED Final   Candida parapsilosis NOT DETECTED NOT  DETECTED Final   Candida tropicalis NOT DETECTED NOT DETECTED Final   Cryptococcus neoformans/gattii NOT DETECTED NOT DETECTED Final   CTX-M ESBL NOT DETECTED NOT DETECTED Final   Carbapenem resistance IMP NOT DETECTED NOT DETECTED Final   Carbapenem resistance KPC NOT DETECTED NOT DETECTED Final   Carbapenem resistance NDM NOT DETECTED NOT DETECTED Final   Carbapenem resist OXA 48 LIKE NOT DETECTED NOT DETECTED Final   Carbapenem resistance VIM NOT DETECTED NOT DETECTED Final    Comment: Performed at [facility], [address] [place], [place] [address]  Culture, blood (routine x 2)     Status: None (Preliminary result)   Collection Time: 11/18/20  4:36 PM   Specimen: BLOOD  Result Value Ref Range Status   Specimen Description BLOOD RIGHT ANTECUBITAL  Final   Special Requests   Final    BOTTLES DRAWN AEROBIC AND ANAEROBIC Blood Culture adequate volume   Culture  Setup Time   Final    GRAM NEGATIVE RODS IN BOTH AEROBIC AND ANAEROBIC BOTTLES CRITICAL VALUE NOTED.  VALUE IS CONSISTENT WITH  PREVIOUSLY REPORTED AND CALLED VALUE. Performed at [facility], [address] [place], [place] [address]    Culture GRAM NEGATIVE RODS  Final   Report Status PENDING  Incomplete  Resp Panel by RT-PCR (Flu A&B, Covid) Nasopharyngeal Swab     Status: None   Collection Time: 11/18/20  4:36 PM   Specimen: Nasopharyngeal Swab; Nasopharyngeal(NP) swabs in vial transport medium  Result Value Ref Range Status   SARS Coronavirus 2 by RT PCR NEGATIVE NEGATIVE Final    Comment: (NOTE) SARS-CoV-2 target nucleic acids are NOT DETECTED.  The SARS-CoV-2 RNA is generally detectable in upper respiratory specimens during the acute phase of infection. The lowest concentration of SARS-CoV-2 viral copies this assay can detect is 138 copies/mL. A negative result does not preclude SARS-Cov-2 infection and should not be used as the sole basis for treatment or other patient management decisions. A negative result may occur with  improper specimen collection/handling, submission of specimen other than nasopharyngeal swab, presence of viral mutation(s) within the areas targeted by this assay, and inadequate number of viral copies(<138 copies/mL). A negative result must be combined with clinical observations, patient history, and epidemiological information. The expected result is Negative.  Fact Sheet for Patients:  EntrepreneurPulse.com.au  Fact Sheet for Healthcare Providers:  IncredibleEmployment.be  This test is no t yet approved or cleared by the [place] FDA and  has been authorized for detection and/or diagnosis of SARS-CoV-2 by FDA under an Emergency Use Authorization (EUA). This EUA will remain  in effect (meaning this test can be used) for the duration of the COVID-19 declaration under Section 564(b)(1) of the Act, 21 U.S.C.section 360bbb-3(b)(1), unless the authorization is terminated  or revoked sooner.       Influenza A by PCR NEGATIVE  NEGATIVE Final   Influenza B by PCR NEGATIVE NEGATIVE Final    Comment: (NOTE) The Xpert Xpress SARS-CoV-2/FLU/RSV plus assay is intended as an aid in the diagnosis of influenza from Nasopharyngeal swab specimens and should not be used as a sole basis for treatment. Nasal washings and aspirates are unacceptable for Xpert Xpress SARS-CoV-2/FLU/RSV testing.  Fact Sheet for Patients: EntrepreneurPulse.com.au  Fact Sheet for Healthcare Providers: IncredibleEmployment.be  This test is not yet approved or cleared by the [place] FDA and has been authorized for detection and/or diagnosis of SARS-CoV-2 by FDA under an Emergency Use Authorization (EUA). This EUA will remain in  effect (meaning this test can be used) for the duration of the COVID-19 declaration under Section 564(b)(1) of the Act, 21 U.S.C. section 360bbb-3(b)(1), unless the authorization is terminated or revoked.  Performed at [facility], [address] [place], [place] [address]   Blood culture (routine single)     Status: None (Preliminary result)   Collection Time: 11/18/20  9:26 PM   Specimen: BLOOD  Result Value Ref Range Status   Specimen Description BLOOD RIGHT ANTECUBITAL  Final   Special Requests   Final    BOTTLES DRAWN AEROBIC AND ANAEROBIC Blood Culture adequate volume   Culture   Final    NO GROWTH < 24 HOURS Performed at [facility], [address], [place], [place] [address]    Report Status PENDING  Incomplete  MRSA PCR Screening     Status: None   Collection Time: 11/18/20  9:55 PM   Specimen: Nasal Mucosa; Nasopharyngeal  Result Value Ref Range Status   MRSA by PCR NEGATIVE NEGATIVE Final    Comment:        The GeneXpert MRSA Assay (FDA approved for NASAL specimens only), is one component of a comprehensive MRSA colonization surveillance program. It is not intended to diagnose MRSA infection nor to guide or monitor treatment  for MRSA infections. Performed at [facility], [address], [place], [place] [address]          Radiology Studies: Abdomen 1 view (KUB)  Result Date: 11/18/2020 CLINICAL DATA:  Right-sided kidney stone unable to urinate EXAM: ABDOMEN - 1 VIEW COMPARISON:  07/15/2020, CT 11/18/2020 FINDINGS: Nonobstructed gas pattern. Probable scarring at the right lung base. Multiple faint calcifications projecting over bilateral kidneys consistent with renal calculi. 4 mm calcification in the right pelvis may correspond to the CT demonstrated right ureteral stone. IMPRESSION: 1. 4 mm calcification in the right pelvis likely corresponds to CT demonstrated distal ureteral stone. 2. Bilateral nephrolithiasis. Electronically Signed   By: Donavan Foil M.D.   On: 11/18/2020 17:58   DG Chest Portable 1 View  Result Date: 11/18/2020 CLINICAL DATA:  Low oxygen saturation EXAM: PORTABLE CHEST 1 VIEW COMPARISON:  None. FINDINGS: Linear scarring or atelectasis at the right base. No consolidation or effusion. Small hiatal hernia. Cardiomediastinal silhouette within normal limits. Aortic atherosclerosis. No pneumothorax. Partially visualized sclerotic lesion in the proximal right humerus IMPRESSION: 1. Linear scarring or atelectasis at the right base. No acute infiltrate or edema. 2. Small hiatal hernia. Electronically Signed   By: Donavan Foil M.D.   On: 11/18/2020 17:53   DG OR UROLOGY CYSTO IMAGE ([facility] ONLY)  Result Date: 11/18/2020 There is no interpretation for this exam.  This order is for images obtained during a surgical procedure.  Please See "Surgeries" Tab for more information regarding the procedure.   CT Renal Stone Study  Result Date: 11/18/2020 CLINICAL DATA:  Flank pain, history of nephrolithiasis. EXAM: CT ABDOMEN AND PELVIS WITHOUT CONTRAST TECHNIQUE: Multidetector CT imaging of the abdomen and pelvis was performed following the standard protocol without IV contrast. COMPARISON:  CT  08/26/2020 FINDINGS: Lower chest: Lung bases are clear. Hepatobiliary: Low-density lesion in the RIGHT hepatic lobe measuring 8 mm unchanged from prior. Multiple gallstones within a noninflamed gallbladder. Pancreas: Pancreas is normal. No ductal dilatation. No pancreatic inflammation. Spleen: Normal spleen Adrenals/urinary tract: Adrenal glands normal. Ten nonobstructing calculi present in the RIGHT kidney ranging size from 1 to 4 mm. There is hydroureter on the RIGHT secondary to obstructing calculus in  the distal RIGHT ureter measuring 3 mm on image 73/2. This RIGHT distal ureteral calculus is approximately 3 cm from the vesicoureteral junction. Similar multiple calcifications in the LEFT kidney. No LEFT ureterolithiasis. No bladder calculi. Foley catheter is positioned within a collapsed bladder. Low-density exophytic lesion posterior to the LEFT kidney likely represents a benign cyst (20 mm, image 35/2). Stomach/Bowel: Moderate hiatal hernia. Stomach, small bowel, appendix, and cecum are normal. The colon and rectosigmoid colon are normal. Vascular/Lymphatic: Abdominal aorta is normal caliber with atherosclerotic calcification. There is no retroperitoneal or periportal lymphadenopathy. No pelvic lymphadenopathy. Reproductive: Prostate unremarkable Other: No free fluid. Musculoskeletal: No aggressive osseous lesion. IMPRESSION: 1. Obstructing calculus in the distal RIGHT ureter with mild hydroureter. 2. Bilateral nephrolithiasis. 3. Foley catheter positioned within a collapsed bladder. 4. Cholelithiasis without evidence cholecystitis. 5. Aortic Atherosclerosis (ICD10-I70.0). Electronically Signed   By: Suzy Bouchard M.D.   On: 11/18/2020 17:48        Scheduled Meds: . Chlorhexidine Gluconate Cloth  6 each Topical Daily   Continuous Infusions: . cefTRIAXone (ROCEPHIN)  IV Stopped (11/19/20 0806)  . lactated ringers 125 mL/hr at 11/19/20 1400  . norepinephrine (LEVOPHED) Adult infusion Stopped  (11/19/20 1136)    Assessment & Plan:   Principal Problem:   Sepsis associated hypotension (HCC) Active Problems:   Symptomatic PVCs   BPH with urinary obstruction   Aorto-iliac atherosclerosis (HCC)   HLD (hyperlipidemia)   Fatigue   Anxiety   Sepsis ([place])  Severe Sepsis - with organ dysfunction, AKI, with hypotension Leukocytosis  - Lactic acid 5.6, WBC 32.3, hypotension with map of 60 Urology following-status post emergent right urethral stent due to obstructing right renal stone in the setting of sepsis-postop day one On Levophed down to two from six this a.m. Monitor hemodynamics Blood culture with Klebsiella pneumonia-follow-up final results and sensitivity Leukocytosis improving continue to monitor Wean off Levophed as hemodynamically as tolerated Continue IV antibiotics Per urology he will require definitive stone treatment after discharge Continue Foley for the next 24 to 48 hours Will need 2 weeks of culture appropriate antibiotics    AKI/Metabolic acidosis - secondary to severe sepsis/and right obstructing stone Improving Continue to monitor Avoid nephrotoxic medications    Hypertension-holding bp meds due to hypotension and pt on Levophed.   BPH-holding finasteride and tamsulosin due to hypotension        DVT prophylaxis: SCD Code Status: Full Family Communication: spoke to wife and updated via phone  Status is: Inpatient  Remains inpatient appropriate because:Hemodynamically unstable   Dispo: The patient is from: Home              Anticipated d/c is to: Home              Anticipated d/c date is: 2 days              Patient currently is not medically stable to d/c.            LOS: 0 days   Time spent: 35 minutes with more than 50% on [place], MD [facility] Pager [phone]  If 7PM-7AM, please contact night-coverage 11/19/2020, 5:39 PM

## 2020-11-20 ENCOUNTER — Encounter: Payer: Self-pay | Admitting: Urology

## 2020-11-20 DIAGNOSIS — N201 Calculus of ureter: Secondary | ICD-10-CM

## 2020-11-20 DIAGNOSIS — R7881 Bacteremia: Secondary | ICD-10-CM

## 2020-11-20 DIAGNOSIS — N390 Urinary tract infection, site not specified: Secondary | ICD-10-CM

## 2020-11-20 DIAGNOSIS — B961 Klebsiella pneumoniae [K. pneumoniae] as the cause of diseases classified elsewhere: Secondary | ICD-10-CM

## 2020-11-20 LAB — CBC
HCT: 35.3 % — ABNORMAL LOW (ref 39.0–52.0)
Hemoglobin: 12 g/dL — ABNORMAL LOW (ref 13.0–17.0)
MCH: 29.7 pg (ref 26.0–34.0)
MCHC: 34 g/dL (ref 30.0–36.0)
MCV: 87.4 fL (ref 80.0–100.0)
Platelets: 65 10*3/uL — ABNORMAL LOW (ref 150–400)
RBC: 4.04 MIL/uL — ABNORMAL LOW (ref 4.22–5.81)
RDW: 13.6 % (ref 11.5–15.5)
WBC: 20.8 10*3/uL — ABNORMAL HIGH (ref 4.0–10.5)
nRBC: 0 % (ref 0.0–0.2)

## 2020-11-20 LAB — BASIC METABOLIC PANEL
Anion gap: 9 (ref 5–15)
BUN: 38 mg/dL — ABNORMAL HIGH (ref 8–23)
CO2: 22 mmol/L (ref 22–32)
Calcium: 8.1 mg/dL — ABNORMAL LOW (ref 8.9–10.3)
Chloride: 111 mmol/L (ref 98–111)
Creatinine, Ser: 1.57 mg/dL — ABNORMAL HIGH (ref 0.61–1.24)
GFR, Estimated: 44 mL/min — ABNORMAL LOW (ref >60)
Glucose, Bld: 111 mg/dL — ABNORMAL HIGH (ref 70–99)
Potassium: 3.8 mmol/L (ref 3.5–5.1)
Sodium: 142 mmol/L (ref 135–145)

## 2020-11-20 LAB — GLUCOSE, CAPILLARY: Glucose-Capillary: 127 mg/dL — ABNORMAL HIGH (ref 70–99)

## 2020-11-20 MED ORDER — PNEUMOCOCCAL VAC POLYVALENT 25 MCG/0.5ML IJ INJ
0.5000 mL | INJECTION | INTRAMUSCULAR | Status: AC
  Administered 2020-11-22: 11:00:00 0.5 mL via INTRAMUSCULAR
  Filled 2020-11-20: qty 0.5

## 2020-11-20 MED ORDER — LORAZEPAM 0.5 MG PO TABS
0.2500 mg | ORAL_TABLET | Freq: Once | ORAL | Status: AC
  Administered 2020-11-20: 16:00:00 0.25 mg via ORAL
  Filled 2020-11-20: qty 1

## 2020-11-20 MED ORDER — ENOXAPARIN SODIUM 40 MG/0.4ML ~~LOC~~ SOLN
40.0000 mg | SUBCUTANEOUS | Status: DC
  Administered 2020-11-20 – 2020-11-22 (×3): 40 mg via SUBCUTANEOUS
  Filled 2020-11-20 (×3): qty 0.4

## 2020-11-20 NOTE — Consult Note (Signed)
NAMEEthelbert Brown  DOB: 1941/07/05  MRN: [id number]  Date/Time: 11/20/2020 2:47 PM  REQUESTING PROVIDER: Dr. Kurtis Bushman Subjective:  REASON FOR CONSULT: Klebsiella bacteremia and complicated UTI ? [name] is a 79 y.o. with a history of renal stones, BPH, presents to the ED from urologist office with chief complaint of right flank pain.   Patient recently underwent On 08/11/2020  HoLEP with morcellation for BPH with bladder outlet obstruction.  At that time the left ureteral stone removal was attempted but  could not and a stent was left in place.  He underwent cystoscopy on 08/28/2020 by Dr. [name] and left ureteral stone removal by lithotripsy and basket extraction and had a stent exchange.  The stent was subsequently removed. Patient presented to the urologist office on 11/18/2020 with right flank pain and nausea.  He also complained of inability to urinate for 2 days.  Upon arriving to the office patient was lethargic and slumped over in the wheelchair. Patient did not have any fever or chills but but as per his wife he slept with multiple blankets.  He also had nausea.  The urologist did a KUB which showed 2 suspicious calcification in the right lower ureter for stones.  He was unable to give a urine specimen in the office.  His PVR was 48.  He was hypotensive and and tachycardic on presentation to the office.  So he was transferred to the ED for further work-up and care. In the ED the vitals were BP of 83/53, heart rate of 35, respiratory rate of 31.  Lab reveals a WBC of 32.3, hemoglobin 13.9 and platelet of 133.  Sodium was 139, potassium 3.7, BUN 35, creatinine 3.43.  Blood culture was sent.  He was started on IV fluids and also on IV cefepime. He underwent a CT renal study and that showed an obstructing calculus in the right distal ureter with mild hydroureter.  There was bilateral nephrolithiasis.  He was seen by urologist and taken for cystoscopy and right ureteral stent placement.  Dr. Bernardo Heater  aspirated purulent urine from the right renal pelvis and sent for culture. The blood culture and urine culture came back as Klebsiella pneumonia.  I am asked to see the patient for the same. Patient states he is feeling better.  His pain in the flank is much better.  He has a Foley catheter.   Past Medical History:  Diagnosis Date  . Actinic keratosis   . BPH with urinary obstruction    sees urology Dr. [name]  . Carotid stenosis 07/20/2016   Minimal on [place] 07/2016. Monitor clinically.  . Depression with anxiety   . Elevated PSA 2014   sees urology - normal biopsies x 3  . History of kidney stones   . HLD (hyperlipidemia)   . Left kidney mass 09/2014   on CT scan thought consistent with simple cyst on prior US  . Nasal obstruction    congenital R sided  . Obstructive pyelonephritis 09/2014   s/p hospitalization and treatment with levaquin  . OSA (obstructive sleep apnea)    pt denies  . Osteoporosis 06/04/2018   DEXA T score -37 at L spine  . Recurrent kidney stones 2014   extensive bilateral nephrolithiasis, sees urology [name]), hydrocodone prn  . Squamous cell carcinoma of skin 07/25/2019   crown scalp  . Squamous cell carcinoma of skin 06/29/2017   SCC IS ant to crown post  . Symptomatic PVCs    per prior cardiologist, on beta blocker  .  Urge incontinence    thought overactive bladder    Past Surgical History:  Procedure Laterality Date  . CARDIAC CATHETERIZATION  03/2012   EF 45-50%, no LVH, + impaired LV relaxation, no PH, no valve abnormalities  . CARDIOVASCULAR STRESS TEST  03/2012   mild peri infarct ischemia in apical segment, decreased EF  . CATARACT EXTRACTION Right 01/2013  . COLONOSCOPY  02/2008   mod diverticulosis o/w WNL (2009)  . CYSTOSCOPY    . CYSTOSCOPY  10/2014   s/p stone removal, with stent removal [name])  . CYSTOSCOPY WITH STENT PLACEMENT Right 11/18/2020   Procedure: CYSTOSCOPY WITH STENT PLACEMENT;  Surgeon: Abbie Sons, MD;  Location: [facility]  ORS;  Service: Urology;  Laterality: Right;  . CYSTOSCOPY/URETEROSCOPY/HOLMIUM LASER/STENT PLACEMENT Left 08/11/2020   Procedure: CYSTOSCOPY/URETEROSCOPY/STENT PLACEMENT;  Surgeon: Hollice Espy, MD;  Location: [facility] ORS;  Service: Urology;  Laterality: Left;  . CYSTOSCOPY/URETEROSCOPY/HOLMIUM LASER/STENT PLACEMENT N/A 08/28/2020   Procedure: CYSTOSCOPY/URETEROSCOPY/HOLMIUM LASER/STENT exchange;  Surgeon: Hollice Espy, MD;  Location: [facility] ORS;  Service: Urology;  Laterality: N/A;  . HOLEP-LASER ENUCLEATION OF THE PROSTATE WITH MORCELLATION N/A 08/11/2020   Procedure: HOLEP-LASER ENUCLEATION OF THE PROSTATE WITH MORCELLATION;  Surgeon: Hollice Espy, MD;  Location: [facility] ORS;  Service: Urology;  Laterality: N/A;  . TONSILECTOMY, ADENOIDECTOMY, BILATERAL MYRINGOTOMY AND TUBES     removed as a child  . US ECHOCARDIOGRAPHY  03/2012   hypokinetic anterior wall, EF 45-50%, impaired relaxation pattern, mild LA dilation, mild-mod MR, mild-mod AR    Social History   Socioeconomic History  . Marital status: Married    Spouse name: Not on file  . Number of children: Not on file  . Years of education: Not on file  . Highest education level: Not on file  Occupational History  . Not on file  Tobacco Use  . Smoking status: Never Smoker  . Smokeless tobacco: Never Used  Vaping Use  . Vaping Use: Never used  Substance and Sexual Activity  . Alcohol use: No  . Drug use: No  . Sexual activity: Not Currently  Other Topics Concern  . Not on file  Social History Narrative   Lives with wife [name])    1 grown daughter   Occupation: retired, worked in Public relations account executive for 30 yrs   Edu: GED   Activity: walking some   Diet: good water, fruits/vegetables daily.      Advanced directives: daughter is HCPOA for now   Social Determinants of Radio broadcast assistant Strain: Not on file  Food Insecurity: Not on file  Transportation Needs: Not on file  Physical Activity: Not on file  Stress: Not  on file  Social Connections: Not on file  Intimate Partner Violence: Not on file    Family History  Problem Relation Age of Onset  . Urolithiasis Daughter   . Kidney Stones Mother   . Diabetes Neg Hx   . Cancer Neg Hx   . CAD Neg Hx   . Stroke Neg Hx   . Hypertension Neg Hx   . Prostate cancer Neg Hx   . Kidney cancer Neg Hx   . Bladder Cancer Neg Hx    No Known Allergies  ? Current Facility-Administered Medications  Medication Dose Route Frequency Provider Last Rate Last Admin  . cefTRIAXone (ROCEPHIN) 2 g in sodium chloride 0.9 % 100 mL IVPB  2 g Intravenous Q24H Hart Robinsons A, RPH   Stopped at 11/20/20 5916  . Chlorhexidine Gluconate Cloth 2 % PADS  6 each  6 each Topical Daily Cox, Amy N, DO   6 each at 11/19/20 2154  . enoxaparin (LOVENOX) injection 40 mg  40 mg Subcutaneous Q24H Nolberto Hanlon, MD   40 mg at 11/20/20 1016  . LORazepam (ATIVAN) tablet 0.25 mg  0.25 mg Oral Once Nolberto Hanlon, MD      . Derrill Memo ON 11/21/2020] pneumococcal 23 valent vaccine (PNEUMOVAX-23) injection 0.5 mL  0.5 mL Intramuscular Tomorrow-1000 Nolberto Hanlon, MD      . traZODone (DESYREL) tablet 50 mg  50 mg Oral QHS PRN Nolberto Hanlon, MD   50 mg at 11/19/20 2154     Abtx:  Anti-infectives (From admission, onward)   Start     Dose/Rate Route Frequency Ordered Stop   11/19/20 1000  ceFEPIme (MAXIPIME) 2 g in sodium chloride 0.9 % 100 mL IVPB  Status:  Discontinued        2 g 200 mL/hr over 30 Minutes Intravenous Every 24 hours 11/18/20 2344 11/19/20 0517   11/19/20 0800  cefTRIAXone (ROCEPHIN) 2 g in sodium chloride 0.9 % 100 mL IVPB        2 g 200 mL/hr over 30 Minutes Intravenous Every 24 hours 11/19/20 0518     11/18/20 1730  ceFEPIme (MAXIPIME) 1 g in sodium chloride 0.9 % 100 mL IVPB        1 g 200 mL/hr over 30 Minutes Intravenous  Once 11/18/20 1721 11/18/20 1800      REVIEW OF SYSTEMS:  Const: negative fever, negative chills, negative weight loss Eyes: negative diplopia or visual  changes, negative eye pain ENT: negative coryza, negative sore throat Resp: negative cough, hemoptysis, dyspnea Cards: negative for chest pain, palpitations, lower extremity edema GU: Anuria, dysuria, GI: Negative for abdominal pain, diarrhea, bleeding, constipation Skin: negative for rash and pruritus Heme: negative for easy bruising and gum/nose bleeding MS: Generalized weakness Neurolo:negative for headaches, dizziness, vertigo, memory problems  Psych: negative for feelings of anxiety, depression  Endocrine: negative for thyroid, diabetes Allergy/Immunology- negative for any medication or food allergies ?  Objective:  VITALS:  BP (!) 148/77 (BP Location: Right Arm)   Pulse 61   Temp 98.4 F (36.9 C) (Oral)   Resp 18   Ht 5\' 6"  (1.676 m)   Wt 56.7 kg   SpO2 95%   BMI 20.18 kg/m  PHYSICAL EXAM:  General: Alert, cooperative, no distress, appears stated age.  Head: Normocephalic, without obvious abnormality, atraumatic. Eyes: Conjunctivae clear, anicteric sclerae. Pupils are equal ENT Nares normal. No drainage or sinus tenderness. Lips, mucosa, and tongue normal. No Thrush Neck: Supple, symmetrical, no adenopathy, thyroid: non tender no carotid bruit and no JVD. Back: No CVA tenderness. Lungs: Clear to auscultation bilaterally. No Wheezing or Rhonchi. No rales. Heart: Regular rate and rhythm, no murmur, rub or gallop. Abdomen: Soft, non-tender,not distended. Bowel sounds normal. No masses Foley catheter draining clear urine. Extremities: atraumatic, no cyanosis. No edema. No clubbing Skin: No rashes or lesions. Or bruising Lymph: Cervical, supraclavicular normal. Neurologic: Grossly non-focal Pertinent Labs Lab Results CBC    Component Value Date/Time   WBC 20.8 (H) 11/20/2020 0649   RBC 4.04 (L) 11/20/2020 0649   HGB 12.0 (L) 11/20/2020 0649   HGB 14.4 10/23/2014 1031   HCT 35.3 (L) 11/20/2020 0649   HCT 44.0 10/23/2014 1031   PLT 65 (L) 11/20/2020 0649   PLT  154 10/23/2014 1031   MCV 87.4 11/20/2020 0649   MCV 92 10/23/2014 1031   MCH  29.7 11/20/2020 0649   MCHC 34.0 11/20/2020 0649   RDW 13.6 11/20/2020 0649   RDW 13.5 10/23/2014 1031   LYMPHSABS 0.5 (L) 11/18/2020 1635   LYMPHSABS 0.9 (L) 10/08/2014 0512   MONOABS 1.7 (H) 11/18/2020 1635   MONOABS 1.0 10/08/2014 0512   EOSABS 0.1 11/18/2020 1635   EOSABS 0.0 10/08/2014 0512   BASOSABS 0.1 11/18/2020 1635   BASOSABS 0.0 10/08/2014 0512    CMP Latest Ref Rng & Units 11/20/2020 11/19/2020 11/18/2020  Glucose 70 - 99 mg/dL 111(H) 114(H) 138(H)  BUN 8 - 23 mg/dL 38(H) 38(H) 35(H)  Creatinine 0.61 - 1.24 mg/dL 1.57(H) 2.34(H) 3.43(H)  Sodium 135 - 145 mmol/L 142 142 139  Potassium 3.5 - 5.1 mmol/L 3.8 4.2 3.7  Chloride 98 - 111 mmol/L 111 111 104  CO2 22 - 32 mmol/L 22 20(L) 18(L)  Calcium 8.9 - 10.3 mg/dL 8.1(L) 7.8(L) 8.7(L)  Total Protein 6.5 - 8.1 g/dL - - 7.1  Total Bilirubin 0.3 - 1.2 mg/dL - - 1.0  Alkaline Phos 38 - 126 U/L - - 60  AST 15 - 41 U/L - - 39  ALT 0 - 44 U/L - - 13      Microbiology: Recent Results (from the past 240 hour(s))  Anaerobic culture     Status: None (Preliminary result)   Collection Time: 11/18/20  4:11 PM   Specimen: PATH Other; Tissue  Result Value Ref Range Status   Specimen Description   Final    URINE, RANDOM Performed at [facility], [address], [place], [place] [address]    Special Requests RIGHT RENAL PELVIS  Final   Gram Stain   Final    ABUNDANT WBC PRESENT, PREDOMINANTLY PMN ABUNDANT GRAM NEGATIVE RODS Performed at [facility], [place] [address] [place], [place] [address]    Culture PENDING  Incomplete   Report Status PENDING  Incomplete  Culture, blood (routine x 2)     Status: Abnormal (Preliminary result)   Collection Time: 11/18/20  4:35 PM   Specimen: BLOOD  Result Value Ref Range Status   Specimen Description   Final    BLOOD BLOOD LEFT FOREARM Performed at [facility], [address], [place], [place] [address]    Special Requests   Final    BOTTLES DRAWN AEROBIC AND ANAEROBIC Blood Culture adequate volume Performed at [facility], [address], [place], [place] [address]    Culture  Setup Time   Final    GRAM NEGATIVE RODS IN BOTH AEROBIC AND ANAEROBIC BOTTLES Organism ID to follow CRITICAL RESULT CALLED TO, READ BACK BY AND VERIFIED WITHHart Robinsons [facility] 0347 11/19/20 HNM Performed at [facility], [address], [place], [place] [address]    Culture (A)  Final    KLEBSIELLA PNEUMONIAE SUSCEPTIBILITIES TO FOLLOW Performed at [facility], [place] [address] [name], [place] [address]    Report Status PENDING  Incomplete  Urine culture     Status: Abnormal (Preliminary result)   Collection Time: 11/18/20  4:35 PM   Specimen: Urine, Random  Result Value Ref Range Status   Specimen Description   Final    URINE, RANDOM Performed at [facility], [address] [place], [place] [address]    Special Requests   Final    NONE Performed at [facility], [address], [place],  [address]    Culture (A)  Final    50,000 COLONIES/mL KLEBSIELLA  PNEUMONIAE SUSCEPTIBILITIES TO FOLLOW Performed at [facility], [place] [address] [place], [place] [address]    Report Status PENDING  Incomplete  Blood Culture ID Panel (Reflexed)     Status: Abnormal   Collection Time: 11/18/20  4:35 PM  Result Value Ref Range Status   Enterococcus faecalis NOT DETECTED NOT DETECTED Final   Enterococcus Faecium NOT DETECTED NOT DETECTED Final   Listeria monocytogenes NOT DETECTED NOT DETECTED Final   Staphylococcus species NOT DETECTED NOT DETECTED Final   Staphylococcus aureus (BCID) NOT DETECTED NOT DETECTED Final   Staphylococcus epidermidis NOT DETECTED NOT DETECTED Final   Staphylococcus lugdunensis NOT DETECTED NOT DETECTED Final   Streptococcus species NOT DETECTED NOT DETECTED Final   Streptococcus agalactiae NOT  DETECTED NOT DETECTED Final   Streptococcus pneumoniae NOT DETECTED NOT DETECTED Final   Streptococcus pyogenes NOT DETECTED NOT DETECTED Final   A.calcoaceticus-baumannii NOT DETECTED NOT DETECTED Final   Bacteroides fragilis NOT DETECTED NOT DETECTED Final   Enterobacterales DETECTED (A) NOT DETECTED Final    Comment: Enterobacterales represent a large order of gram negative bacteria, not a single organism. CRITICAL RESULT CALLED TO, READ BACK BY AND VERIFIED WITH: SCOTT HALL PHARMD 0459 11/19/20 HNM    Enterobacter cloacae complex NOT DETECTED NOT DETECTED Final   Escherichia coli NOT DETECTED NOT DETECTED Final   Klebsiella aerogenes NOT DETECTED NOT DETECTED Final   Klebsiella oxytoca NOT DETECTED NOT DETECTED Final   Klebsiella pneumoniae DETECTED (A) NOT DETECTED Final    Comment: CRITICAL RESULT CALLED TO, READ BACK BY AND VERIFIED WITH: Hart Robinsons PHARMD 0459 11/19/20 HNM    Proteus species NOT DETECTED NOT DETECTED Final   Salmonella species NOT DETECTED NOT DETECTED Final   Serratia marcescens NOT DETECTED NOT DETECTED Final   Haemophilus influenzae NOT DETECTED NOT DETECTED Final   Neisseria meningitidis NOT DETECTED NOT DETECTED Final   Pseudomonas aeruginosa NOT DETECTED NOT DETECTED Final   Stenotrophomonas maltophilia NOT DETECTED NOT DETECTED Final   Candida albicans NOT DETECTED NOT DETECTED Final   Candida auris NOT DETECTED NOT DETECTED Final   Candida glabrata NOT DETECTED NOT DETECTED Final   Candida krusei NOT DETECTED NOT DETECTED Final   Candida parapsilosis NOT DETECTED NOT DETECTED Final   Candida tropicalis NOT DETECTED NOT DETECTED Final   Cryptococcus neoformans/gattii NOT DETECTED NOT DETECTED Final   CTX-M ESBL NOT DETECTED NOT DETECTED Final   Carbapenem resistance IMP NOT DETECTED NOT DETECTED Final   Carbapenem resistance KPC NOT DETECTED NOT DETECTED Final   Carbapenem resistance NDM NOT DETECTED NOT DETECTED Final   Carbapenem resist OXA 48 LIKE  NOT DETECTED NOT DETECTED Final   Carbapenem resistance VIM NOT DETECTED NOT DETECTED Final    Comment: Performed at [facility], [address] [place], [place] [address]  Culture, blood (routine x 2)     Status: None (Preliminary result)   Collection Time: 11/18/20  4:36 PM   Specimen: BLOOD  Result Value Ref Range Status   Specimen Description BLOOD RIGHT ANTECUBITAL  Final   Special Requests   Final    BOTTLES DRAWN AEROBIC AND ANAEROBIC Blood Culture adequate volume   Culture  Setup Time   Final    GRAM NEGATIVE RODS IN BOTH AEROBIC AND ANAEROBIC BOTTLES CRITICAL VALUE NOTED.  VALUE IS CONSISTENT WITH PREVIOUSLY REPORTED AND CALLED VALUE. Performed at [facility], [address], [place], [place] [address]    Culture [place]  Final   Report  Status PENDING  Incomplete  Resp Panel by RT-PCR (Flu A&B, Covid) Nasopharyngeal Swab     Status: None   Collection Time: 11/18/20  4:36 PM   Specimen: Nasopharyngeal Swab; Nasopharyngeal(NP) swabs in vial transport medium  Result Value Ref Range Status   SARS Coronavirus 2 by RT PCR NEGATIVE NEGATIVE Final    Comment: (NOTE) SARS-CoV-2 target nucleic acids are NOT DETECTED.  The SARS-CoV-2 RNA is generally detectable in upper respiratory specimens during the acute phase of infection. The lowest concentration of SARS-CoV-2 viral copies this assay can detect is 138 copies/mL. A negative result does not preclude SARS-Cov-2 infection and should not be used as the sole basis for treatment or other patient management decisions. A negative result may occur with  improper specimen collection/handling, submission of specimen other than nasopharyngeal swab, presence of viral mutation(s) within the areas targeted by this assay, and inadequate number of viral copies(<138 copies/mL). A negative result must be combined with clinical observations, patient history, and epidemiological information. The expected result is  Negative.  Fact Sheet for Patients:  EntrepreneurPulse.com.au  Fact Sheet for Healthcare Providers:  IncredibleEmployment.be  This test is no t yet approved or cleared by the [place] FDA and  has been authorized for detection and/or diagnosis of SARS-CoV-2 by FDA under an Emergency Use Authorization (EUA). This EUA will remain  in effect (meaning this test can be used) for the duration of the COVID-19 declaration under Section 564(b)(1) of the Act, 21 U.S.C.section 360bbb-3(b)(1), unless the authorization is terminated  or revoked sooner.       Influenza A by PCR NEGATIVE NEGATIVE Final   Influenza B by PCR NEGATIVE NEGATIVE Final    Comment: (NOTE) The Xpert Xpress SARS-CoV-2/FLU/RSV plus assay is intended as an aid in the diagnosis of influenza from Nasopharyngeal swab specimens and should not be used as a sole basis for treatment. Nasal washings and aspirates are unacceptable for Xpert Xpress SARS-CoV-2/FLU/RSV testing.  Fact Sheet for Patients: EntrepreneurPulse.com.au  Fact Sheet for Healthcare Providers: IncredibleEmployment.be  This test is not yet approved or cleared by the [place] FDA and has been authorized for detection and/or diagnosis of SARS-CoV-2 by FDA under an Emergency Use Authorization (EUA). This EUA will remain in effect (meaning this test can be used) for the duration of the COVID-19 declaration under Section 564(b)(1) of the Act, 21 U.S.C. section 360bbb-3(b)(1), unless the authorization is terminated or revoked.  Performed at [facility], [address] [place], [place] [address]   Urine Culture     Status: Abnormal (Preliminary result)   Collection Time: 11/18/20  8:12 PM   Specimen: Urine, Random  Result Value Ref Range Status   Specimen Description URINE, RANDOM  Final   Special Requests RIGHT RENAL PELVIS  Final   Culture (A)  Final    >=100,000  COLONIES/mL KLEBSIELLA PNEUMONIAE SUSCEPTIBILITIES TO FOLLOW Performed at [facility], [place] [address] [place], [place] [address]    Report Status PENDING  Incomplete  Blood culture (routine single)     Status: None (Preliminary result)   Collection Time: 11/18/20  9:26 PM   Specimen: BLOOD  Result Value Ref Range Status   Specimen Description BLOOD RIGHT ANTECUBITAL  Final   Special Requests   Final    BOTTLES DRAWN AEROBIC AND ANAEROBIC Blood Culture adequate volume   Culture   Final    NO GROWTH 2 DAYS Performed at [facility], [address] [place], [place] [address]  Report Status PENDING  Incomplete  MRSA PCR Screening     Status: None   Collection Time: 11/18/20  9:55 PM   Specimen: Nasal Mucosa; Nasopharyngeal  Result Value Ref Range Status   MRSA by PCR NEGATIVE NEGATIVE Final    Comment:        The GeneXpert MRSA Assay (FDA approved for NASAL specimens only), is one component of a comprehensive MRSA colonization surveillance program. It is not intended to diagnose MRSA infection nor to guide or monitor treatment for MRSA infections. Performed at [facility], [place] [place],  [address]     IMAGING RESULTS: Obstructing calculus in the distal RIGHT ureter with mild hydroureter. 2. Bilateral nephrolithiasis. 3. Foley catheter positioned within a collapsed bladder. 4. Cholelithiasis without evidence cholecystitis. 10 nonobstructing renal calculi on the right side I have personally reviewed the films ? Impression/Recommendation ? Right obstructing ureteric renal calculi causing hydroureter infection right kidney.  Status post cystoscopy and stent placement.  Klebsiella bacteremia and Klebsiella UTI which is complicated.  ? On ceftriaxone Multiple renal calculi bilaterally right more than left.  AKI on CKD secondary to obstruction by stone. Also with infection.  Improving   BPH with bladder outlet obstruction  S/PHoLEP morcellation in August 2021. ? ___________________________________________________ Discussed with patient, requesting provider Note:  This document was prepared using Dragon voice recognition software and may include unintentional dictation errors.

## 2020-11-20 NOTE — Progress Notes (Signed)
Pt remains on 2L [place] overnight. Catheter continues to put out adequate amber urine. Pt intermittently confused to situation and time; tearful and anxious at times. Easily redirected. PRN trazadone given for sleep. Pt in no acute distress @ this time. Will continue to monitor.

## 2020-11-20 NOTE — Progress Notes (Addendum)
PROGRESS NOTE    [name]  PJA:[id number] DOB: 1940/12/19 DOA: 11/18/2020 PCP: [name], MD    Brief Narrative:  [name] is a 79 y.o. male with medical history significant for symptomatic pvcs, left URS/LL/uretral stent placement and with stent removal in 10/2004, right ESWL in 12/2014, presented to the ED for chief concern of right flank pain.  12/8-on Levophed overnight, now on low dose 12/9-Overnight hemodynamically stable. D/c'd ivf since pt c/o some sob. Can transfer to medical floor  Consultants:   urology  Procedures:   Antimicrobials:   Ceftriaxone   Subjective: Pt c/o mild sob,no fever, chills, cp, abdominal pain. Also c/o night getting any sleep again.   Objective: Vitals:   11/20/20 0500 11/20/20 0600 11/20/20 0700 11/20/20 0740  BP: (!) 145/78 139/76 133/84   Pulse: 92 93 82 98  Resp: (!) 30 (!) 29 (!) 29 (!) 23  Temp:    98.2 F (36.8 C)  TempSrc:    Oral  SpO2: 97% 95% 95% 95%  Weight:      Height:        Intake/Output Summary (Last 24 hours) at 11/20/2020 0847 Last data filed at 11/20/2020 0600 Gross per 24 hour  Intake 1943.89 ml  Output 1430 ml  Net 513.89 ml   Filed Weights   11/18/20 1700  Weight: 56.7 kg    Examination:  Nad , sitting up in bed eating breakfast Coarse bs no wheezing Regular s1/s2 no gallop Soft benign +bs No edema  Aaxo3, grossly intact    Data Reviewed: I have personally reviewed following labs and imaging studies  CBC: Recent Labs  Lab 11/18/20 1635 11/19/20 0624 11/20/20 0649  WBC 32.3* 24.8* 20.8*  NEUTROABS 29.1*  --   --   HGB 13.9 11.6* 12.0*  HCT 42.2 34.7* 35.3*  MCV 90.2 89.2 87.4  PLT 133* 78* 65*   Basic Metabolic Panel: Recent Labs  Lab 11/18/20 1635 11/19/20 0624 11/20/20 0649  NA 139 142 142  K 3.7 4.2 3.8  CL 104 111 111  CO2 18* 20* 22  GLUCOSE 138* 114* 111*  BUN 35* 38* 38*  CREATININE 3.43* 2.34* 1.57*  CALCIUM 8.7* 7.8* 8.1*  MG  --  1.6*  --     GFR: Estimated Creatinine Clearance: 29.6 mL/min (A) (by C-G formula based on SCr of 1.57 mg/dL (H)). Liver Function Tests: Recent Labs  Lab 11/18/20 1635  AST 39  ALT 13  ALKPHOS 60  BILITOT 1.0  PROT 7.1  ALBUMIN 3.5   Recent Labs  Lab 11/18/20 1635  LIPASE 18   No results for input(s): AMMONIA in the last 168 hours. Coagulation Profile: Recent Labs  Lab 11/18/20 2125  INR 1.3*   Cardiac Enzymes: No results for input(s): CKTOTAL, CKMB, CKMBINDEX, TROPONINI in the last 168 hours. BNP (last 3 results) No results for input(s): PROBNP in the last 8760 hours. HbA1C: No results for input(s): HGBA1C in the last 72 hours. CBG: Recent Labs  Lab 11/18/20 2123  GLUCAP 95   Lipid Profile: No results for input(s): CHOL, HDL, LDLCALC, TRIG, CHOLHDL, LDLDIRECT in the last 72 hours. Thyroid Function Tests: No results for input(s): TSH, T4TOTAL, FREET4, T3FREE, THYROIDAB in the last 72 hours. Anemia Panel: No results for input(s): VITAMINB12, FOLATE, FERRITIN, TIBC, IRON, RETICCTPCT in the last 72 hours. Sepsis Labs: Recent Labs  Lab 11/18/20 1635 11/18/20 1827  LATICACIDVEN 5.6* 3.8*    Recent Results (from the past 240 hour(s))  Anaerobic culture  Status: None (Preliminary result)   Collection Time: 11/18/20  4:11 PM   Specimen: PATH Other; Tissue  Result Value Ref Range Status   Specimen Description   Final    URINE, RANDOM Performed at [facility], [address] [place], [place] [address]    Special Requests RIGHT RENAL PELVIS  Final   Gram Stain   Final    ABUNDANT WBC PRESENT, PREDOMINANTLY PMN ABUNDANT GRAM NEGATIVE RODS Performed at [facility], [place] [address], [place], [place] [address]    Culture PENDING  Incomplete   Report Status PENDING  Incomplete  Culture, blood (routine x 2)     Status: None (Preliminary result)   Collection Time: 11/18/20  4:35 PM   Specimen: BLOOD  Result Value Ref Range Status   Specimen  Description BLOOD BLOOD LEFT FOREARM  Final   Special Requests   Final    BOTTLES DRAWN AEROBIC AND ANAEROBIC Blood Culture adequate volume   Culture  Setup Time   Final    GRAM NEGATIVE RODS IN BOTH AEROBIC AND ANAEROBIC BOTTLES Organism ID to follow CRITICAL RESULT CALLED TO, READ BACK BY AND VERIFIED WITHHart Robinsons [facility] 7517 11/19/20 HNM Performed at [facility], [address] [place], [place] [address]    Culture GRAM NEGATIVE RODS  Final   Report Status PENDING  Incomplete  Urine culture     Status: Abnormal (Preliminary result)   Collection Time: 11/18/20  4:35 PM   Specimen: Urine, Random  Result Value Ref Range Status   Specimen Description   Final    URINE, RANDOM Performed at [facility], [address] [place], [place] [address]    Special Requests   Final    NONE Performed at [facility], [address], [place], [place] [address]    Culture (A)  Final    50,000 COLONIES/mL GRAM NEGATIVE RODS IDENTIFICATION AND SUSCEPTIBILITIES TO FOLLOW Performed at [facility], [place] [address], [place],  [address]    Report Status PENDING  Incomplete  Blood Culture ID Panel (Reflexed)     Status: Abnormal   Collection Time: 11/18/20  4:35 PM  Result Value Ref Range Status   Enterococcus faecalis NOT DETECTED NOT DETECTED Final   Enterococcus Faecium NOT DETECTED NOT DETECTED Final   Listeria monocytogenes NOT DETECTED NOT DETECTED Final   Staphylococcus species NOT DETECTED NOT DETECTED Final   Staphylococcus aureus (BCID) NOT DETECTED NOT DETECTED Final   Staphylococcus epidermidis NOT DETECTED NOT DETECTED Final   Staphylococcus lugdunensis NOT DETECTED NOT DETECTED Final   Streptococcus species NOT DETECTED NOT DETECTED Final   Streptococcus agalactiae NOT DETECTED NOT DETECTED Final   Streptococcus pneumoniae NOT DETECTED NOT DETECTED Final   Streptococcus pyogenes NOT DETECTED NOT DETECTED Final    A.calcoaceticus-baumannii NOT DETECTED NOT DETECTED Final   Bacteroides fragilis NOT DETECTED NOT DETECTED Final   Enterobacterales DETECTED (A) NOT DETECTED Final    Comment: Enterobacterales represent a large order of gram negative bacteria, not a single organism. CRITICAL RESULT CALLED TO, READ BACK BY AND VERIFIED WITH: SCOTT HALL PHARMD [id number] 11/19/20 HNM    Enterobacter cloacae complex NOT DETECTED NOT DETECTED Final   Escherichia coli NOT DETECTED NOT DETECTED Final   Klebsiella aerogenes NOT DETECTED NOT DETECTED Final   Klebsiella oxytoca NOT DETECTED NOT DETECTED Final   Klebsiella pneumoniae DETECTED (A) NOT DETECTED Final    Comment: CRITICAL RESULT CALLED TO, READ BACK BY AND VERIFIED WITH:  Nicki Reaper HALL PHARMD [id number] 11/19/20 HNM    Proteus species NOT DETECTED NOT DETECTED Final   Salmonella species NOT DETECTED NOT DETECTED Final   Serratia marcescens NOT DETECTED NOT DETECTED Final   Haemophilus influenzae NOT DETECTED NOT DETECTED Final   Neisseria meningitidis NOT DETECTED NOT DETECTED Final   Pseudomonas aeruginosa NOT DETECTED NOT DETECTED Final   Stenotrophomonas maltophilia NOT DETECTED NOT DETECTED Final   Candida albicans NOT DETECTED NOT DETECTED Final   Candida auris NOT DETECTED NOT DETECTED Final   Candida glabrata NOT DETECTED NOT DETECTED Final   Candida krusei NOT DETECTED NOT DETECTED Final   Candida parapsilosis NOT DETECTED NOT DETECTED Final   Candida tropicalis NOT DETECTED NOT DETECTED Final   Cryptococcus neoformans/gattii NOT DETECTED NOT DETECTED Final   CTX-M ESBL NOT DETECTED NOT DETECTED Final   Carbapenem resistance IMP NOT DETECTED NOT DETECTED Final   Carbapenem resistance KPC NOT DETECTED NOT DETECTED Final   Carbapenem resistance NDM NOT DETECTED NOT DETECTED Final   Carbapenem resist OXA 48 LIKE NOT DETECTED NOT DETECTED Final   Carbapenem resistance VIM NOT DETECTED NOT DETECTED Final    Comment: Performed at [facility], [address] [place], [place] [address]  Culture, blood (routine x 2)     Status: None (Preliminary result)   Collection Time: 11/18/20  4:36 PM   Specimen: BLOOD  Result Value Ref Range Status   Specimen Description BLOOD RIGHT ANTECUBITAL  Final   Special Requests   Final    BOTTLES DRAWN AEROBIC AND ANAEROBIC Blood Culture adequate volume   Culture  Setup Time   Final    GRAM NEGATIVE RODS IN BOTH AEROBIC AND ANAEROBIC BOTTLES CRITICAL VALUE NOTED.  VALUE IS CONSISTENT WITH PREVIOUSLY REPORTED AND CALLED VALUE. Performed at [facility], [address] [place], [place] [address]    Culture GRAM NEGATIVE RODS  Final   Report Status PENDING  Incomplete  Resp Panel by RT-PCR (Flu A&B, Covid) Nasopharyngeal Swab     Status: None   Collection Time: 11/18/20  4:36 PM   Specimen: Nasopharyngeal Swab; Nasopharyngeal(NP) swabs in vial transport medium  Result Value Ref Range Status   SARS Coronavirus 2 by RT PCR NEGATIVE NEGATIVE Final    Comment: (NOTE) SARS-CoV-2 target nucleic acids are NOT DETECTED.  The SARS-CoV-2 RNA is generally detectable in upper respiratory specimens during the acute phase of infection. The lowest concentration of SARS-CoV-2 viral copies this assay can detect is 138 copies/mL. A negative result does not preclude SARS-Cov-2 infection and should not be used as the sole basis for treatment or other patient management decisions. A negative result may occur with  improper specimen collection/handling, submission of specimen other than nasopharyngeal swab, presence of viral mutation(s) within the areas targeted by this assay, and inadequate number of viral copies(<138 copies/mL). A negative result must be combined with clinical observations, patient history, and epidemiological information. The expected result is Negative.  Fact Sheet for Patients:  EntrepreneurPulse.com.au  Fact Sheet for Healthcare Providers:   IncredibleEmployment.be  This test is no t yet approved or cleared by the [place] FDA and  has been authorized for detection and/or diagnosis of SARS-CoV-2 by FDA under an Emergency Use Authorization (EUA). This EUA will remain  in effect (meaning this test can be used) for the duration of the COVID-19 declaration under Section 564(b)(1) of the Act, 21 U.S.C.section 360bbb-3(b)(1), unless the authorization is terminated  or revoked sooner.  Influenza A by PCR NEGATIVE NEGATIVE Final   Influenza B by PCR NEGATIVE NEGATIVE Final    Comment: (NOTE) The Xpert Xpress SARS-CoV-2/FLU/RSV plus assay is intended as an aid in the diagnosis of influenza from Nasopharyngeal swab specimens and should not be used as a sole basis for treatment. Nasal washings and aspirates are unacceptable for Xpert Xpress SARS-CoV-2/FLU/RSV testing.  Fact Sheet for Patients: EntrepreneurPulse.com.au  Fact Sheet for Healthcare Providers: IncredibleEmployment.be  This test is not yet approved or cleared by the [place] FDA and has been authorized for detection and/or diagnosis of SARS-CoV-2 by FDA under an Emergency Use Authorization (EUA). This EUA will remain in effect (meaning this test can be used) for the duration of the COVID-19 declaration under Section 564(b)(1) of the Act, 21 U.S.C. section 360bbb-3(b)(1), unless the authorization is terminated or revoked.  Performed at [facility], [address], [place], [place] [address]   Urine Culture     Status: Abnormal (Preliminary result)   Collection Time: 11/18/20  8:12 PM   Specimen: Urine, Random  Result Value Ref Range Status   Specimen Description URINE, RANDOM  Final   Special Requests RIGHT RENAL PELVIS  Final   Culture (A)  Final    >=100,000 COLONIES/mL GRAM NEGATIVE RODS IDENTIFICATION AND SUSCEPTIBILITIES TO FOLLOW Performed at [facility], [place] [address] [place], [place] [address]    Report Status PENDING  Incomplete  Blood culture (routine single)     Status: None (Preliminary result)   Collection Time: 11/18/20  9:26 PM   Specimen: BLOOD  Result Value Ref Range Status   Specimen Description BLOOD RIGHT ANTECUBITAL  Final   Special Requests   Final    BOTTLES DRAWN AEROBIC AND ANAEROBIC Blood Culture adequate volume   Culture   Final    NO GROWTH 2 DAYS Performed at [facility], [address], [place], [place] [address]    Report Status PENDING  Incomplete  MRSA PCR Screening     Status: None   Collection Time: 11/18/20  9:55 PM   Specimen: Nasal Mucosa; Nasopharyngeal  Result Value Ref Range Status   MRSA by PCR NEGATIVE NEGATIVE Final    Comment:        The GeneXpert MRSA Assay (FDA approved for NASAL specimens only), is one component of a comprehensive MRSA colonization surveillance program. It is not intended to diagnose MRSA infection nor to guide or monitor treatment for MRSA infections. Performed at [facility], [address], [place],  [address]          Radiology Studies: Abdomen 1 view (KUB)  Result Date: 11/18/2020 CLINICAL DATA:  Right-sided kidney stone unable to urinate EXAM: ABDOMEN - 1 VIEW COMPARISON:  07/15/2020, CT 11/18/2020 FINDINGS: Nonobstructed gas pattern. Probable scarring at the right lung base. Multiple faint calcifications projecting over bilateral kidneys consistent with renal calculi. 4 mm calcification in the right pelvis may correspond to the CT demonstrated right ureteral stone. IMPRESSION: 1. 4 mm calcification in the right pelvis likely corresponds to CT demonstrated distal ureteral stone. 2. Bilateral nephrolithiasis. Electronically Signed   By: Donavan Foil M.D.   On: 11/18/2020 17:58   DG Chest Portable 1 View  Result Date: 11/18/2020 CLINICAL DATA:  Low oxygen saturation EXAM: PORTABLE CHEST 1 VIEW COMPARISON:  None. FINDINGS: Linear scarring  or atelectasis at the right base. No consolidation or effusion. Small hiatal hernia. Cardiomediastinal silhouette within normal limits. Aortic atherosclerosis. No pneumothorax. Partially visualized sclerotic  lesion in the proximal right humerus IMPRESSION: 1. Linear scarring or atelectasis at the right base. No acute infiltrate or edema. 2. Small hiatal hernia. Electronically Signed   By: Donavan Foil M.D.   On: 11/18/2020 17:53   DG OR UROLOGY CYSTO IMAGE ([facility] ONLY)  Result Date: 11/18/2020 There is no interpretation for this exam.  This order is for images obtained during a surgical procedure.  Please See "Surgeries" Tab for more information regarding the procedure.   CT Renal Stone Study  Result Date: 11/18/2020 CLINICAL DATA:  Flank pain, history of nephrolithiasis. EXAM: CT ABDOMEN AND PELVIS WITHOUT CONTRAST TECHNIQUE: Multidetector CT imaging of the abdomen and pelvis was performed following the standard protocol without IV contrast. COMPARISON:  CT 08/26/2020 FINDINGS: Lower chest: Lung bases are clear. Hepatobiliary: Low-density lesion in the RIGHT hepatic lobe measuring 8 mm unchanged from prior. Multiple gallstones within a noninflamed gallbladder. Pancreas: Pancreas is normal. No ductal dilatation. No pancreatic inflammation. Spleen: Normal spleen Adrenals/urinary tract: Adrenal glands normal. Ten nonobstructing calculi present in the RIGHT kidney ranging size from 1 to 4 mm. There is hydroureter on the RIGHT secondary to obstructing calculus in the distal RIGHT ureter measuring 3 mm on image 73/2. This RIGHT distal ureteral calculus is approximately 3 cm from the vesicoureteral junction. Similar multiple calcifications in the LEFT kidney. No LEFT ureterolithiasis. No bladder calculi. Foley catheter is positioned within a collapsed bladder. Low-density exophytic lesion posterior to the LEFT kidney likely represents a benign cyst (20 mm, image 35/2). Stomach/Bowel: Moderate hiatal hernia.  Stomach, small bowel, appendix, and cecum are normal. The colon and rectosigmoid colon are normal. Vascular/Lymphatic: Abdominal aorta is normal caliber with atherosclerotic calcification. There is no retroperitoneal or periportal lymphadenopathy. No pelvic lymphadenopathy. Reproductive: Prostate unremarkable Other: No free fluid. Musculoskeletal: No aggressive osseous lesion. IMPRESSION: 1. Obstructing calculus in the distal RIGHT ureter with mild hydroureter. 2. Bilateral nephrolithiasis. 3. Foley catheter positioned within a collapsed bladder. 4. Cholelithiasis without evidence cholecystitis. 5. Aortic Atherosclerosis (ICD10-I70.0). Electronically Signed   By: Suzy Bouchard M.D.   On: 11/18/2020 17:48        Scheduled Meds: . Chlorhexidine Gluconate Cloth  6 each Topical Daily  . enoxaparin (LOVENOX) injection  40 mg Subcutaneous Q24H   Continuous Infusions: . cefTRIAXone (ROCEPHIN)  IV 2 g (11/20/20 0749)  . lactated ringers 125 mL/hr at 11/20/20 0428  . norepinephrine (LEVOPHED) Adult infusion Stopped (11/19/20 1136)    Assessment & Plan:   Principal Problem:   Sepsis associated hypotension (HCC) Active Problems:   Symptomatic PVCs   BPH with urinary obstruction   Aorto-iliac atherosclerosis (HCC)   HLD (hyperlipidemia)   Fatigue   Anxiety   Sepsis ([facility])  Severe Sepsis - with organ dysfunction, AKI, with hypotension Leukocytosis  - Lactic acid 5.6, WBC 32.3, hypotension with map of 60 Urology following-status post emergent right urethral stent due to obstructing right renal stone in the setting of sepsis-postop day one 12/9-weaned off of Levophed, and IV fluids.  Will DC IV fluid as patient is mildly short of breath to prevent volume overload Blood and urine culture both Klebsiella-final results sensitivity pending Repeat blood culture pending We will consult ID for guidance on antibiotic and duration Leukocytosis improving, hemodynamically stable Can transfer out of  stepdown/ICU unit to MedSurg Okay to DC Foley per urology Plan for outpatient ureteroscopy with laser lithotripsy and stent exchange in 2 to 3 weeks He will require 14 days of culture appropriate antibiotic therapy  AKI/Metabolic acidosis - secondary to severe sepsis/and right obstructing stone Improving with ivf Will dc ivf as pt mildly sob. Encourage po intake Avoid nephrotoxic meds     Hypertension-holding bp as pt was on pressors and ivf. Resume home meds when needed.    BPH-holding finasteride and tamsulosin due to hypotension earlier.   We will resume as BP tolerated        DVT prophylaxis: lovenox Code Status: Full Family Communication: spoke to wife and updated via phone  Status is: Inpatient  Remains inpatient appropriate because:due to severity of his illness requiring hospitalization. Needs iv abx   Dispo: The patient is from: Home              Anticipated d/c is to: Home              Anticipated d/c date is: 2 days              Patient currently is not medically stable to d/c.            LOS: 1 day   Time spent: 35 minutes with more than 50% on Mer Rouge, MD [facility] Pager [phone]  If 7PM-7AM, please contact night-coverage 11/20/2020, 8:47 AM

## 2020-11-20 NOTE — Progress Notes (Signed)
Urology Inpatient Progress Note  Subjective: No acute events overnight, patient remains intermittently confused, tearful, and anxious.   Levophed discontinued overnight.  He remains afebrile.  BP and HR stable, stably tachypneic. Creatinine down today, 1.57.  WBC count down today, 20.8.  Blood cultures preliminary positive with Klebsiella species, no susceptibilities yet.  Repeat blood cultures pending with no growth at 2 days.  On antibiotics as below. Foley catheter in place draining clear, yellow urine. Patient denies pain today.  Anti-infectives: Anti-infectives (From admission, onward)   Start     Dose/Rate Route Frequency Ordered Stop   11/19/20 1000  ceFEPIme (MAXIPIME) 2 g in sodium chloride 0.9 % 100 mL IVPB  Status:  Discontinued        2 g 200 mL/hr over 30 Minutes Intravenous Every 24 hours 11/18/20 2344 11/19/20 0517   11/19/20 0800  cefTRIAXone (ROCEPHIN) 2 g in sodium chloride 0.9 % 100 mL IVPB        2 g 200 mL/hr over 30 Minutes Intravenous Every 24 hours 11/19/20 0518     11/18/20 1730  ceFEPIme (MAXIPIME) 1 g in sodium chloride 0.9 % 100 mL IVPB        1 g 200 mL/hr over 30 Minutes Intravenous  Once 11/18/20 1721 11/18/20 1800      Current Facility-Administered Medications  Medication Dose Route Frequency Provider Last Rate Last Admin  . cefTRIAXone (ROCEPHIN) 2 g in sodium chloride 0.9 % 100 mL IVPB  2 g Intravenous Q24H Hall, Scott A, RPH 200 mL/hr at 11/20/20 0749 2 g at 11/20/20 0749  . Chlorhexidine Gluconate Cloth 2 % PADS 6 each  6 each Topical Daily Cox, Amy N, DO   6 each at 11/19/20 2154  . enoxaparin (LOVENOX) injection 40 mg  40 mg Subcutaneous Q24H Nolberto Hanlon, MD      . lactated ringers infusion   Intravenous Continuous Sharion Settler, NP 125 mL/hr at 11/20/20 0428 New Bag at 11/20/20 0428  . traZODone (DESYREL) tablet 50 mg  50 mg Oral QHS PRN Nolberto Hanlon, MD   50 mg at 11/19/20 2154   Objective: Vital signs in last 24 hours: Temp:  [97.5 F  (36.4 C)-99 F (37.2 C)] 98.2 F (36.8 C) (12/09 0740) Pulse Rate:  [73-116] 98 (12/09 0740) Resp:  [14-42] 23 (12/09 0740) BP: (101-145)/(51-117) 133/84 (12/09 0700) SpO2:  [91 %-100 %] 95 % (12/09 0740)  Intake/Output from previous day: 12/08 0701 - 12/09 0700 In: 2409.6 [P.O.:480; I.V.:829.5; IV Piggyback:1100.1] Out: 1555 [Urine:1555] Intake/Output this shift: No intake/output data recorded.  Physical Exam Vitals and nursing note reviewed.  Constitutional:      General: He is not in acute distress.    Appearance: He is not ill-appearing, toxic-appearing or diaphoretic.  HENT:     Head: Normocephalic and atraumatic.  Pulmonary:     Effort: Pulmonary effort is normal. No respiratory distress.  Neurological:     Mental Status: He is alert. He is confused.  Psychiatric:        Mood and Affect: Mood normal.        Behavior: Behavior normal.    Lab Results:  Recent Labs    11/19/20 0624 11/20/20 0649  WBC 24.8* 20.8*  HGB 11.6* 12.0*  HCT 34.7* 35.3*  PLT 78* 65*   BMET Recent Labs    11/19/20 0624 11/20/20 0649  NA 142 142  K 4.2 3.8  CL 111 111  CO2 20* 22  GLUCOSE 114* 111*  BUN 38*  38*  CREATININE 2.34* 1.57*  CALCIUM 7.8* 8.1*   PT/INR Recent Labs    11/18/20 2125  LABPROT 15.8*  INR 1.3*   Assessment & Plan: 79 year old male s/p right ureteral stent placement for management of urosepsis secondary to an obstructing 3 mm distal right ureteral stone.  He remains in the ICU but is clinically improving and no longer requiring pressor support.  Recommendations: -Continue empiric antibiotics and follow urine cultures.  He will require 14 days of culture appropriate antibiotic therapy -Okay to DC Foley -We will plan for outpatient ureteroscopy with laser lithotripsy and stent exchange in 2 to 3 weeks  Debroah Loop, [name] 11/20/2020

## 2020-11-20 NOTE — Plan of Care (Signed)

## 2020-11-21 ENCOUNTER — Inpatient Hospital Stay: Payer: Medicare HMO

## 2020-11-21 LAB — URINE CULTURE
Culture: 50000 — AB
Culture: >=100000 — AB

## 2020-11-21 LAB — CULTURE, BLOOD (ROUTINE X 2)
Special Requests: ADEQUATE
Special Requests: ADEQUATE

## 2020-11-21 LAB — BASIC METABOLIC PANEL
Anion gap: 10 (ref 5–15)
BUN: 38 mg/dL — ABNORMAL HIGH (ref 8–23)
CO2: 23 mmol/L (ref 22–32)
Calcium: 8 mg/dL — ABNORMAL LOW (ref 8.9–10.3)
Chloride: 109 mmol/L (ref 98–111)
Creatinine, Ser: 1.38 mg/dL — ABNORMAL HIGH (ref 0.61–1.24)
GFR, Estimated: 51 mL/min — ABNORMAL LOW (ref >60)
Glucose, Bld: 102 mg/dL — ABNORMAL HIGH (ref 70–99)
Potassium: 3.7 mmol/L (ref 3.5–5.1)
Sodium: 142 mmol/L (ref 135–145)

## 2020-11-21 LAB — CBC
HCT: 36.4 % — ABNORMAL LOW (ref 39.0–52.0)
Hemoglobin: 12.7 g/dL — ABNORMAL LOW (ref 13.0–17.0)
MCH: 29.9 pg (ref 26.0–34.0)
MCHC: 34.9 g/dL (ref 30.0–36.0)
MCV: 85.6 fL (ref 80.0–100.0)
Platelets: 69 10*3/uL — ABNORMAL LOW (ref 150–400)
RBC: 4.25 MIL/uL (ref 4.22–5.81)
RDW: 13.8 % (ref 11.5–15.5)
WBC: 19.2 10*3/uL — ABNORMAL HIGH (ref 4.0–10.5)
nRBC: 0 % (ref 0.0–0.2)

## 2020-11-21 LAB — LACTIC ACID, PLASMA: Lactic Acid, Venous: 1.1 mmol/L (ref 0.5–1.9)

## 2020-11-21 MED ORDER — METOPROLOL SUCCINATE ER 25 MG PO TB24
12.5000 mg | ORAL_TABLET | Freq: Every day | ORAL | Status: DC
Start: 2020-11-21 — End: 2020-11-22
  Administered 2020-11-21 – 2020-11-22 (×2): 12.5 mg via ORAL
  Filled 2020-11-21 (×2): qty 1

## 2020-11-21 MED ORDER — TAMSULOSIN HCL 0.4 MG PO CAPS
0.4000 mg | ORAL_CAPSULE | Freq: Every day | ORAL | Status: DC
  Administered 2020-11-21: 19:00:00 0.4 mg via ORAL
  Filled 2020-11-21: qty 1

## 2020-11-21 MED ORDER — LORAZEPAM 2 MG/ML IJ SOLN
1.0000 mg | Freq: Once | INTRAMUSCULAR | Status: AC
  Administered 2020-11-21: 1 mg via INTRAVENOUS
  Filled 2020-11-21: qty 1

## 2020-11-21 NOTE — Progress Notes (Signed)
PROGRESS NOTE    [name]  NWG:[id number] DOB: September 30, 1941 DOA: 11/18/2020 PCP: [name], MD    Brief Narrative:  [name] is a 79 y.o. male with medical history significant for symptomatic pvcs, left URS/LL/uretral stent placement and with stent removal in 10/2004, right ESWL in 12/2014, presented to the ED for chief concern of right flank pain.  12/8-on Levophed overnight, now on low dose  12/10-Pt reports feeling a little better. alittle sob  Consultants:   urology  Procedures:   Antimicrobials:   Ceftriaxone   Subjective: Reports got a little more sleep than before. Going to eat breakfast. Reports little sob. No cp or abdominal pain   Objective: Vitals:   11/20/20 2351 11/21/20 0538 11/21/20 0859 11/21/20 1233  BP: 136/71 134/74 (!) 142/119 (!) 145/80  Pulse: 96 99 (!) 104 91  Resp: 19 19 20  (!) 24  Temp: 98.9 F (37.2 C)  (!) 97 F (36.1 C) 98 F (36.7 C)  TempSrc: Oral  Axillary   SpO2: 93% 90% 92% 94%  Weight:      Height:        Intake/Output Summary (Last 24 hours) at 11/21/2020 1238 Last data filed at 11/21/2020 0900 Gross per 24 hour  Intake --  Output 700 ml  Net -700 ml   Filed Weights   11/18/20 1700  Weight: 56.7 kg    Examination: Sleepy , nad Decrease bs at bases, no wheezing Regular s1/s2 Soft bening, +bs No edema Neuro grossly intact Mood and affect appropriate in current setting     Data Reviewed: I have personally reviewed following labs and imaging studies  CBC: Recent Labs  Lab 11/18/20 1635 11/19/20 0624 11/20/20 0649 11/21/20 0500  WBC 32.3* 24.8* 20.8* 19.2*  NEUTROABS 29.1*  --   --   --   HGB 13.9 11.6* 12.0* 12.7*  HCT 42.2 34.7* 35.3* 36.4*  MCV 90.2 89.2 87.4 85.6  PLT 133* 78* 65* 69*   Basic Metabolic Panel: Recent Labs  Lab 11/18/20 1635 11/19/20 0624 11/20/20 0649 11/21/20 0500  NA 139 142 142 142  K 3.7 4.2 3.8 3.7  CL 104 111 111 109  CO2 18* 20* 22 23  GLUCOSE 138* 114*  111* 102*  BUN 35* 38* 38* 38*  CREATININE 3.43* 2.34* 1.57* 1.38*  CALCIUM 8.7* 7.8* 8.1* 8.0*  MG  --  1.6*  --   --    GFR: Estimated Creatinine Clearance: 33.7 mL/min (A) (by C-G formula based on SCr of 1.38 mg/dL (H)). Liver Function Tests: Recent Labs  Lab 11/18/20 1635  AST 39  ALT 13  ALKPHOS 60  BILITOT 1.0  PROT 7.1  ALBUMIN 3.5   Recent Labs  Lab 11/18/20 1635  LIPASE 18   No results for input(s): AMMONIA in the last 168 hours. Coagulation Profile: Recent Labs  Lab 11/18/20 2125  INR 1.3*   Cardiac Enzymes: No results for input(s): CKTOTAL, CKMB, CKMBINDEX, TROPONINI in the last 168 hours. BNP (last 3 results) No results for input(s): PROBNP in the last 8760 hours. HbA1C: No results for input(s): HGBA1C in the last 72 hours. CBG: Recent Labs  Lab 11/18/20 2123 11/19/20 1226  GLUCAP 95 127*   Lipid Profile: No results for input(s): CHOL, HDL, LDLCALC, TRIG, CHOLHDL, LDLDIRECT in the last 72 hours. Thyroid Function Tests: No results for input(s): TSH, T4TOTAL, FREET4, T3FREE, THYROIDAB in the last 72 hours. Anemia Panel: No results for input(s): VITAMINB12, FOLATE, FERRITIN, TIBC, IRON, RETICCTPCT in the last  72 hours. Sepsis Labs: Recent Labs  Lab 11/18/20 1635 11/18/20 1827  LATICACIDVEN 5.6* 3.8*    Recent Results (from the past 240 hour(s))  Anaerobic culture     Status: None (Preliminary result)   Collection Time: 11/18/20  4:11 PM   Specimen: PATH Other; Tissue  Result Value Ref Range Status   Specimen Description   Final    URINE, RANDOM Performed at [facility], [address], [place], [place] [address]    Special Requests RIGHT RENAL PELVIS  Final   Gram Stain   Final    ABUNDANT WBC PRESENT, PREDOMINANTLY PMN ABUNDANT GRAM NEGATIVE RODS Performed at [facility], [place] [address], [place], [place] [address]    Culture PENDING  Incomplete   Report Status PENDING  Incomplete  Culture, blood (routine x 2)      Status: Abnormal   Collection Time: 11/18/20  4:35 PM   Specimen: BLOOD  Result Value Ref Range Status   Specimen Description   Final    BLOOD BLOOD LEFT FOREARM Performed at [facility], [address] [place], [place] [address]    Special Requests   Final    BOTTLES DRAWN AEROBIC AND ANAEROBIC Blood Culture adequate volume Performed at [facility], [address] [place], [place] [address]    Culture  Setup Time   Final    GRAM NEGATIVE RODS IN BOTH AEROBIC AND ANAEROBIC BOTTLES Organism ID to follow CRITICAL RESULT CALLED TO, READ BACK BY AND VERIFIED WITH: Hart Robinsons [facility] [id number] 11/19/20 HNM Performed at [facility], [address], [place], [place] [address]    Culture KLEBSIELLA PNEUMONIAE (A)  Final   Report Status 11/21/2020 FINAL  Final   Organism ID, Bacteria KLEBSIELLA PNEUMONIAE  Final      Susceptibility   Klebsiella pneumoniae - MIC*    AMPICILLIN >=32 RESISTANT Resistant     CEFAZOLIN <=4 SENSITIVE Sensitive     CEFEPIME <=0.12 SENSITIVE Sensitive     CEFTAZIDIME <=1 SENSITIVE Sensitive     CEFTRIAXONE <=0.25 SENSITIVE Sensitive     CIPROFLOXACIN <=0.25 SENSITIVE Sensitive     GENTAMICIN <=1 SENSITIVE Sensitive     IMIPENEM <=0.25 SENSITIVE Sensitive     TRIMETH/SULFA <=20 SENSITIVE Sensitive     AMPICILLIN/SULBACTAM 8 SENSITIVE Sensitive     PIP/TAZO 8 SENSITIVE Sensitive     * KLEBSIELLA PNEUMONIAE  Urine culture     Status: Abnormal   Collection Time: 11/18/20  4:35 PM   Specimen: Urine, Random  Result Value Ref Range Status   Specimen Description   Final    URINE, RANDOM Performed at [facility], [address] [place], [place] [address]    Special Requests   Final    NONE Performed at [facility], [address] [place], [place] [address]    Culture 50,000 COLONIES/mL KLEBSIELLA PNEUMONIAE (A)  Final   Report Status 11/21/2020 FINAL  Final   Organism ID, Bacteria KLEBSIELLA PNEUMONIAE (A)   Final      Susceptibility   Klebsiella pneumoniae - MIC*    AMPICILLIN >=32 RESISTANT Resistant     CEFAZOLIN <=4 SENSITIVE Sensitive     CEFEPIME <=0.12 SENSITIVE Sensitive     CEFTRIAXONE <=0.25 SENSITIVE Sensitive     CIPROFLOXACIN <=0.25 SENSITIVE Sensitive     GENTAMICIN <=1 SENSITIVE Sensitive     IMIPENEM <=0.25 SENSITIVE Sensitive     NITROFURANTOIN 128 RESISTANT Resistant     TRIMETH/SULFA <=20 SENSITIVE  Sensitive     AMPICILLIN/SULBACTAM 8 SENSITIVE Sensitive     PIP/TAZO 8 SENSITIVE Sensitive     * 50,000 COLONIES/mL KLEBSIELLA PNEUMONIAE  Blood Culture ID Panel (Reflexed)     Status: Abnormal   Collection Time: 11/18/20  4:35 PM  Result Value Ref Range Status   Enterococcus faecalis NOT DETECTED NOT DETECTED Final   Enterococcus Faecium NOT DETECTED NOT DETECTED Final   Listeria monocytogenes NOT DETECTED NOT DETECTED Final   Staphylococcus species NOT DETECTED NOT DETECTED Final   Staphylococcus aureus (BCID) NOT DETECTED NOT DETECTED Final   Staphylococcus epidermidis NOT DETECTED NOT DETECTED Final   Staphylococcus lugdunensis NOT DETECTED NOT DETECTED Final   Streptococcus species NOT DETECTED NOT DETECTED Final   Streptococcus agalactiae NOT DETECTED NOT DETECTED Final   Streptococcus pneumoniae NOT DETECTED NOT DETECTED Final   Streptococcus pyogenes NOT DETECTED NOT DETECTED Final   A.calcoaceticus-baumannii NOT DETECTED NOT DETECTED Final   Bacteroides fragilis NOT DETECTED NOT DETECTED Final   Enterobacterales DETECTED (A) NOT DETECTED Final    Comment: Enterobacterales represent a large order of gram negative bacteria, not a single organism. CRITICAL RESULT CALLED TO, READ BACK BY AND VERIFIED WITH: SCOTT HALL PHARMD 0459 11/19/20 HNM    Enterobacter cloacae complex NOT DETECTED NOT DETECTED Final   Escherichia coli NOT DETECTED NOT DETECTED Final   Klebsiella aerogenes NOT DETECTED NOT DETECTED Final   Klebsiella oxytoca NOT DETECTED NOT DETECTED Final    Klebsiella pneumoniae DETECTED (A) NOT DETECTED Final    Comment: CRITICAL RESULT CALLED TO, READ BACK BY AND VERIFIED WITH: Hart Robinsons PHARMD 0459 11/19/20 HNM    Proteus species NOT DETECTED NOT DETECTED Final   Salmonella species NOT DETECTED NOT DETECTED Final   Serratia marcescens NOT DETECTED NOT DETECTED Final   Haemophilus influenzae NOT DETECTED NOT DETECTED Final   Neisseria meningitidis NOT DETECTED NOT DETECTED Final   Pseudomonas aeruginosa NOT DETECTED NOT DETECTED Final   Stenotrophomonas maltophilia NOT DETECTED NOT DETECTED Final   Candida albicans NOT DETECTED NOT DETECTED Final   Candida auris NOT DETECTED NOT DETECTED Final   Candida glabrata NOT DETECTED NOT DETECTED Final   Candida krusei NOT DETECTED NOT DETECTED Final   Candida parapsilosis NOT DETECTED NOT DETECTED Final   Candida tropicalis NOT DETECTED NOT DETECTED Final   Cryptococcus neoformans/gattii NOT DETECTED NOT DETECTED Final   CTX-M ESBL NOT DETECTED NOT DETECTED Final   Carbapenem resistance IMP NOT DETECTED NOT DETECTED Final   Carbapenem resistance KPC NOT DETECTED NOT DETECTED Final   Carbapenem resistance NDM NOT DETECTED NOT DETECTED Final   Carbapenem resist OXA 48 LIKE NOT DETECTED NOT DETECTED Final   Carbapenem resistance VIM NOT DETECTED NOT DETECTED Final    Comment: Performed at [facility], [address] [place], [place] [address]  Culture, blood (routine x 2)     Status: Abnormal   Collection Time: 11/18/20  4:36 PM   Specimen: BLOOD  Result Value Ref Range Status   Specimen Description   Final    BLOOD RIGHT ANTECUBITAL Performed at [facility], [address] [place], [place] [address]    Special Requests   Final    BOTTLES DRAWN AEROBIC AND ANAEROBIC Blood Culture adequate volume Performed at [facility], [address] [place], [place] [address]    Culture  Setup Time   Final    GRAM NEGATIVE RODS IN BOTH AEROBIC AND ANAEROBIC  BOTTLES CRITICAL VALUE NOTED.  VALUE IS CONSISTENT WITH  PREVIOUSLY REPORTED AND CALLED VALUE. Performed at [facility], [address] [place], [place] [address]    Culture (A)  Final    KLEBSIELLA PNEUMONIAE SUSCEPTIBILITIES PERFORMED ON PREVIOUS CULTURE WITHIN THE LAST 5 DAYS. Performed at [facility], [place] [address], [place], [place] [address]    Report Status 11/21/2020 FINAL  Final  Resp Panel by RT-PCR (Flu A&B, Covid) Nasopharyngeal Swab     Status: None   Collection Time: 11/18/20  4:36 PM   Specimen: Nasopharyngeal Swab; Nasopharyngeal(NP) swabs in vial transport medium  Result Value Ref Range Status   SARS Coronavirus 2 by RT PCR NEGATIVE NEGATIVE Final    Comment: (NOTE) SARS-CoV-2 target nucleic acids are NOT DETECTED.  The SARS-CoV-2 RNA is generally detectable in upper respiratory specimens during the acute phase of infection. The lowest concentration of SARS-CoV-2 viral copies this assay can detect is 138 copies/mL. A negative result does not preclude SARS-Cov-2 infection and should not be used as the sole basis for treatment or other patient management decisions. A negative result may occur with  improper specimen collection/handling, submission of specimen other than nasopharyngeal swab, presence of viral mutation(s) within the areas targeted by this assay, and inadequate number of viral copies(<138 copies/mL). A negative result must be combined with clinical observations, patient history, and epidemiological information. The expected result is Negative.  Fact Sheet for Patients:  EntrepreneurPulse.com.au  Fact Sheet for Healthcare Providers:  IncredibleEmployment.be  This test is no t yet approved or cleared by the [place] FDA and  has been authorized for detection and/or diagnosis of SARS-CoV-2 by FDA under an Emergency Use Authorization (EUA). This EUA will remain  in effect (meaning this test can  be used) for the duration of the COVID-19 declaration under Section 564(b)(1) of the Act, 21 U.S.C.section 360bbb-3(b)(1), unless the authorization is terminated  or revoked sooner.       Influenza A by PCR NEGATIVE NEGATIVE Final   Influenza B by PCR NEGATIVE NEGATIVE Final    Comment: (NOTE) The Xpert Xpress SARS-CoV-2/FLU/RSV plus assay is intended as an aid in the diagnosis of influenza from Nasopharyngeal swab specimens and should not be used as a sole basis for treatment. Nasal washings and aspirates are unacceptable for Xpert Xpress SARS-CoV-2/FLU/RSV testing.  Fact Sheet for Patients: EntrepreneurPulse.com.au  Fact Sheet for Healthcare Providers: IncredibleEmployment.be  This test is not yet approved or cleared by the [place] FDA and has been authorized for detection and/or diagnosis of SARS-CoV-2 by FDA under an Emergency Use Authorization (EUA). This EUA will remain in effect (meaning this test can be used) for the duration of the COVID-19 declaration under Section 564(b)(1) of the Act, 21 U.S.C. section 360bbb-3(b)(1), unless the authorization is terminated or revoked.  Performed at [facility], [address] [place], [place] [address]   Urine Culture     Status: Abnormal   Collection Time: 11/18/20  8:12 PM   Specimen: Urine, Random  Result Value Ref Range Status   Specimen Description URINE, RANDOM  Final   Special Requests   Final    RIGHT RENAL PELVIS Performed at [facility], [place] [address], [place], [place] [address]    Culture >=100,000 COLONIES/mL KLEBSIELLA PNEUMONIAE (A)  Final   Report Status 11/21/2020 FINAL  Final   Organism ID, Bacteria KLEBSIELLA PNEUMONIAE (A)  Final      Susceptibility   Klebsiella pneumoniae - MIC*    AMPICILLIN >=32 RESISTANT Resistant  CEFAZOLIN <=4 SENSITIVE Sensitive     CEFEPIME <=0.12 SENSITIVE Sensitive     CEFTRIAXONE <=0.25 SENSITIVE Sensitive      CIPROFLOXACIN <=0.25 SENSITIVE Sensitive     GENTAMICIN <=1 SENSITIVE Sensitive     IMIPENEM <=0.25 SENSITIVE Sensitive     NITROFURANTOIN 128 RESISTANT Resistant     TRIMETH/SULFA <=20 SENSITIVE Sensitive     AMPICILLIN/SULBACTAM 8 SENSITIVE Sensitive     PIP/TAZO 8 SENSITIVE Sensitive     * >=100,000 COLONIES/mL KLEBSIELLA PNEUMONIAE  Blood culture (routine single)     Status: None (Preliminary result)   Collection Time: 11/18/20  9:26 PM   Specimen: BLOOD  Result Value Ref Range Status   Specimen Description BLOOD RIGHT ANTECUBITAL  Final   Special Requests   Final    BOTTLES DRAWN AEROBIC AND ANAEROBIC Blood Culture adequate volume   Culture   Final    NO GROWTH 3 DAYS Performed at [facility], [address], [place], [place] [address]    Report Status PENDING  Incomplete  MRSA PCR Screening     Status: None   Collection Time: 11/18/20  9:55 PM   Specimen: Nasal Mucosa; Nasopharyngeal  Result Value Ref Range Status   MRSA by PCR NEGATIVE NEGATIVE Final    Comment:        The GeneXpert MRSA Assay (FDA approved for NASAL specimens only), is one component of a comprehensive MRSA colonization surveillance program. It is not intended to diagnose MRSA infection nor to guide or monitor treatment for MRSA infections. Performed at [facility], [address], [place], [place] [address]          Radiology Studies: No results found.      Scheduled Meds: . Chlorhexidine Gluconate Cloth  6 each Topical Daily  . enoxaparin (LOVENOX) injection  40 mg Subcutaneous Q24H  . pneumococcal 23 valent vaccine  0.5 mL Intramuscular Tomorrow-1000   Continuous Infusions: . cefTRIAXone (ROCEPHIN)  IV 2 g (11/21/20 1026)    Assessment & Plan:   Principal Problem:   Sepsis associated hypotension (HCC) Active Problems:   Symptomatic PVCs   BPH with urinary obstruction   Aorto-iliac atherosclerosis (HCC)   HLD (hyperlipidemia)   Fatigue   Anxiety    Sepsis (HCC)  Severe Sepsis - with organ dysfunction, AKI, with hypotension Leukocytosis  - Lactic acid 5.6, WBC 32.3, hypotension with map of 60 Urology following-status post emergent right urethral stent due to obstructing right renal stone in the setting of sepsis-postop day one Was weaned off of Levophed and ICU transfer to the medical floor Has remained hemodynamically stable Blood cultures positive for Klebsiella pneumonia-ID was consulted ID recommended ceftriaxone Wbc coming down Per urology will require definite stone treatment after discharge Foley discontinued will need total 2 weeks of culture appropriate antibiotics, has 10 more days left Will get cxr this am as he is complaining of mild sob. Pt did get ivf initially. 02 sat stable currently      AKI/Metabolic acidosis - secondary to severe sepsis/and right obstructing stone Improving. Creatinine down to 1.38 today Continue to monitor   Hypertension-new commands was initially held due to hypotension and sepsis.  Will resume his Toprol-XL BP now more elevated   BPH-since BP stable we will resume tamsulosin   Consult PT    DVT prophylaxis: Lovenox Code Status: Full Family Communication: spoke to wife and updated via phone  Status is: Inpatient  Remains inpatient appropriate because:requires iv treatment   Dispo: The patient is  from: Home              Anticipated d/c is to: Home              Anticipated d/c date is: 1 day              Patient currently is not medically stable to d/c.needs iv abx, getting cxr as pt with sob, PT consulted.            LOS: 2 days   Time spent: 35 minutes with more than 50% on [place], MD [facility] Pager [phone]  If 7PM-7AM, please contact night-coverage 11/21/2020, 12:38 PM

## 2020-11-21 NOTE — Plan of Care (Signed)

## 2020-11-21 NOTE — Care Management Important Message (Signed)
Important Message  Patient Details  Name: [name] MRN: [id number] Date of Birth: Jul 16, 1941   Medicare Important Message Given:  N/A - LOS <3 / Initial given by admissions     [name] 11/21/2020, 8:26 AM

## 2020-11-21 NOTE — Evaluation (Signed)
Physical Therapy Evaluation Patient Details Name: [name] MRN: [id number] DOB: [birth year] Today's Date: 11/21/2020   History of Present Illness  Pt is an 79 y.o. male presenting to hospital 12/7 with R flank pain.  Pt admitted with severe sepsis, AKI, htn, and BPH.  S/p 12/7 cystoscopy with R ureteral stent placement.  Transferred to ICU post procedure d/t requiring pressor support.  PMH includes BPH with urinary obstruction, carotid stenosis, h/o kidney stones, OSA, h/o verterbral compression fx.  Clinical Impression  Prior to hospital admission, pt was independent with ambulation; lives with his wife on main level of home with 1 small step to enter.  Currently pt is SBA semi-supine to sitting edge of bed; CGA with transfers; and CGA ambulating 120 feet and then 200 feet no UE support/AD (sitting rest break between ambulation trials).  Increased respiratory rate noted intermittently during session (up to 37-44) but improved with cueing to slow breathing rate; O2 sats briefly 91% but mostly 93% or greater on room air during sessions activities.  HR 90-107 bpm during sessions activities.  Beginning of session, pt noted to be incontinent of urine in bed requiring assist for gown and linen change.  After pt stood for 2nd ambulation trial pt incontinent of urine again requiring assist for clean-up, linen change, and gown and sock change (brief donned for next ambulation trial and doffed end of session).  Pt would benefit from skilled PT to address noted impairments and functional limitations (see below for any additional details).  Upon hospital discharge, pt would benefit from [place].    Follow Up Recommendations Home health PT    Equipment Recommendations  None recommended by PT    Recommendations for Other Services       Precautions / Restrictions Precautions Precautions: Fall Restrictions Weight Bearing Restrictions: No      Mobility  Bed Mobility Overal bed mobility: Needs  Assistance Bed Mobility: Supine to Sit     Supine to sit: Supervision;HOB elevated     General bed mobility comments: increased effort/time to perform on own    Transfers Overall transfer level: Needs assistance   Transfers: Sit to/from Stand Sit to Stand: Min guard         General transfer comment: x1 trial from bed; x3 trials from recliner; improved quality and effort of stand noted with repeat efforts  Ambulation/Gait Ambulation/Gait assistance: Min guard Gait Distance (Feet):  (120 feet; 200 feet) Assistive device: None   Gait velocity: mildly decreased   General Gait Details: initially mildly unsteady (CGA provided for safety but no overt loss of balance noted) first 50 feet (of 120 feet of ambulation trial) but improved steadiness noted after that; pt steady with 200 feet of ambulation trial  Stairs            Wheelchair Mobility    Modified Rankin (Stroke Patients Only)       Balance Overall balance assessment: Needs assistance Sitting-balance support: No upper extremity supported;Feet supported Sitting balance-Leahy Scale: Good Sitting balance - Comments: steady sitting reaching within BOS   Standing balance support: No upper extremity supported;During functional activity Standing balance-Leahy Scale: Good Standing balance comment: no loss of balance with ambulation or standing reaching within BOS                             Pertinent Vitals/Pain Pain Assessment: No/denies pain    Home Living Family/patient expects to be discharged to:: Private residence Living Arrangements:  Spouse/significant other Available Help at Discharge: Family Type of Home: Other(Comment) (Condo) Home Access: Stairs to enter Entrance Stairs-Rails: None Entrance Stairs-Number of Steps: 1 small step to enter Home Layout: One level (bonus room 2nd floor (does not need to go to)) Home Equipment: Walker - 2 wheels;Cane - single point      Prior Function  Level of Independence: Independent         Comments: No falls in past 6 months.     Hand Dominance   Dominant Hand: Right    Extremity/Trunk Assessment   Upper Extremity Assessment Upper Extremity Assessment: Generalized weakness    Lower Extremity Assessment Lower Extremity Assessment: Generalized weakness    Cervical / Trunk Assessment Cervical / Trunk Assessment: Normal  Communication   Communication: HOH  Cognition Arousal/Alertness: Awake/alert Behavior During Therapy: WFL for tasks assessed/performed Overall Cognitive Status: Within Functional Limits for tasks assessed                                 General Comments: Oriented to person, general place, general situation, and general time      General Comments   Nursing cleared pt for participation in physical therapy.  Pt agreeable to PT session.    Exercises  Vc's for controlling respiratory rate   Assessment/Plan    PT Assessment Patient needs continued PT services  PT Problem List Decreased strength;Decreased activity tolerance;Decreased balance;Decreased mobility;Cardiopulmonary status limiting activity       PT Treatment Interventions Gait training;Stair training;Functional mobility training;Therapeutic activities;Therapeutic exercise;Balance training;Patient/family education    PT Goals (Current goals can be found in the Care Plan section)  Acute Rehab PT Goals Patient Stated Goal: to go home PT Goal Formulation: With patient/family Time For Goal Achievement: 12/05/20 Potential to Achieve Goals: Good    Frequency Min 2X/week   Barriers to discharge        Co-evaluation               AM-PAC PT "6 Clicks" Mobility  Outcome Measure Help needed turning from your back to your side while in a flat bed without using bedrails?: None Help needed moving from lying on your back to sitting on the side of a flat bed without using bedrails?: A Little Help needed moving to and from  a bed to a chair (including a wheelchair)?: A Little Help needed standing up from a chair using your arms (e.g., wheelchair or bedside chair)?: A Little Help needed to walk in hospital room?: A Little Help needed climbing 3-5 steps with a railing? : A Little 6 Click Score: 19    End of Session Equipment Utilized During Treatment: Gait belt Activity Tolerance: Patient tolerated treatment well Patient left: in chair;with call bell/phone within reach;with chair alarm set;with family/visitor present Nurse Communication: Mobility status;Precautions PT Visit Diagnosis: Muscle weakness (generalized) (M62.81);Unsteadiness on feet (R26.81);Other abnormalities of gait and mobility (R26.89)    Time: 6144-3154 PT Time Calculation (min) (ACUTE ONLY): 60 min   Charges:   PT Evaluation $PT Eval Low Complexity: 1 Low PT Treatments $Gait Training: 8-22 mins $Therapeutic Activity: 8-22 mins       Leitha Bleak, PT 11/21/20, 4:53 PM

## 2020-11-21 NOTE — Progress Notes (Signed)
Date of Admission:  11/18/2020      ID: [name] is a 79 y.o. male  Principal Problem:   Sepsis associated hypotension ([place]) Active Problems:   Symptomatic PVCs   BPH with urinary obstruction   Aorto-iliac atherosclerosis ([facility])   HLD (hyperlipidemia)   Fatigue   Anxiety   Sepsis ([place])    Subjective: Patient feeling better Working with PT No flank pain No fever No blood in the urine   Medications:  . Chlorhexidine Gluconate Cloth  6 each Topical Daily  . enoxaparin (LOVENOX) injection  40 mg Subcutaneous Q24H  . metoprolol succinate  12.5 mg Oral Daily  . pneumococcal 23 valent vaccine  0.5 mL Intramuscular Tomorrow-1000  . tamsulosin  0.4 mg Oral QPC supper    Objective: Vital signs in last 24 hours: Patient Vitals for the past 24 hrs:  BP Temp Temp src Pulse Resp SpO2  11/21/20 1233 (!) 145/80 98 F (36.7 C) -- 91 (!) 24 94 %  11/21/20 0859 (!) 142/119 (!) 97 F (36.1 C) Axillary (!) 104 20 92 %  11/21/20 0538 134/74 -- -- 99 19 90 %  11/20/20 2351 136/71 98.9 F (37.2 C) Oral 96 19 93 %   PHYSICAL EXAM:  General: Alert, cooperative, no distress, frail head: Normocephalic, without obvious abnormality, atraumatic. Eyes: Conjunctivae clear, anicteric sclerae. Pupils are equal ENT Nares normal. No drainage or sinus tenderness. Lips, mucosa, and tongue normal. No Thrush Neck: Supple, symmetrical, no adenopathy, thyroid: non tender no carotid bruit and no JVD. Lungs: Bilateral air entry heart: Regular rate and rhythm, no murmur, rub or gallop. Abdomen: Soft, non-tender,not distended. Bowel sounds normal. No masses Extremities: atraumatic, no cyanosis. No edema. No clubbing Skin: No rashes or lesions. Or bruising Lymph: Cervical, supraclavicular normal. Neurologic: Grossly non-focal  Lab Results Recent Labs    11/20/20 0649 11/21/20 0500  WBC 20.8* 19.2*  HGB 12.0* 12.7*  HCT 35.3* 36.4*  NA 142 142  K 3.8 3.7  CL 111 109  CO2 22 23  BUN 38* 38*   CREATININE 1.57* 1.38*   Liver Panel Recent Labs    11/18/20 1635  PROT 7.1  ALBUMIN 3.5  AST 39  ALT 13  ALKPHOS 60  BILITOT 1.0   Sedimentation Rate No results for input(s): ESRSEDRATE in the last 72 hours. C-Reactive Protein No results for input(s): CRP in the last 72 hours.  Microbiology:  Studies/Results: DG Chest Port 1 View  Result Date: 11/21/2020 CLINICAL DATA:  Shortness of breath EXAM: PORTABLE CHEST - 1 VIEW COMPARISON:  11/18/2020 FINDINGS: Relatively low lung volumes. Increase in mild diffuse bilateral interstitial edema/infiltrates with patchy airspace opacities in the lung bases. Blunting of the lateral costophrenic angles suggesting effusions. Heart size upper limits normal.  Tortuous thoracic aorta as before. No pneumothorax. Visualized bones unremarkable. Right ureteral catheter partially visualized. IMPRESSION: Worsening bilateral interstitial edema/infiltrates and small pleural effusions. Electronically Signed   By: Lucrezia Europe M.D.   On: 11/21/2020 14:23     Assessment/Plan: Right ureteric calculus causing obstruction leading to hydroureter and infection.  Status post cystoscopy and stent placement Multiple renal calculi bilaterally right more than left  Klebsiella bacteremia and Klebsiella UTI On ceftriaxone. On discharge will go on p.o. Levaquin to be given  for for the remaining of a total of 14 days.  Leukocytosis is declining but still high.  Would recommend ultrasound of the kidney to make sure there is no hydronephrosis.   AKI on CKD secondary to  obstruction with stone.  And also due to infection.  Is improving.  BPH with bladder outlet obstruction.  S/P HoLEP morcellation in August 2021. Discussed the management with the patient and his wife and the requesting provider. ID will follow him peripherally this weekend.  Call if needed.

## 2020-11-22 LAB — CBC
HCT: 34.4 % — ABNORMAL LOW (ref 39.0–52.0)
Hemoglobin: 11.5 g/dL — ABNORMAL LOW (ref 13.0–17.0)
MCH: 29 pg (ref 26.0–34.0)
MCHC: 33.4 g/dL (ref 30.0–36.0)
MCV: 86.9 fL (ref 80.0–100.0)
Platelets: 74 10*3/uL — ABNORMAL LOW (ref 150–400)
RBC: 3.96 MIL/uL — ABNORMAL LOW (ref 4.22–5.81)
RDW: 13.8 % (ref 11.5–15.5)
WBC: 9.8 10*3/uL (ref 4.0–10.5)
nRBC: 0 % (ref 0.0–0.2)

## 2020-11-22 LAB — BASIC METABOLIC PANEL
Anion gap: 9 (ref 5–15)
BUN: 37 mg/dL — ABNORMAL HIGH (ref 8–23)
CO2: 24 mmol/L (ref 22–32)
Calcium: 7.8 mg/dL — ABNORMAL LOW (ref 8.9–10.3)
Chloride: 107 mmol/L (ref 98–111)
Creatinine, Ser: 1.3 mg/dL — ABNORMAL HIGH (ref 0.61–1.24)
GFR, Estimated: 55 mL/min — ABNORMAL LOW (ref >60)
Glucose, Bld: 106 mg/dL — ABNORMAL HIGH (ref 70–99)
Potassium: 3.4 mmol/L — ABNORMAL LOW (ref 3.5–5.1)
Sodium: 140 mmol/L (ref 135–145)

## 2020-11-22 MED ORDER — LEVOFLOXACIN 250 MG PO TABS: 250.0000 mg | ORAL_TABLET | Freq: Every day | ORAL | 0 refills | Status: DC

## 2020-11-22 MED ORDER — POTASSIUM CHLORIDE CRYS ER 20 MEQ PO TBCR
20.0000 meq | EXTENDED_RELEASE_TABLET | Freq: Once | ORAL | Status: AC
  Administered 2020-11-22: 20 meq via ORAL
  Filled 2020-11-22: qty 1

## 2020-11-22 MED ORDER — FUROSEMIDE 20 MG PO TABS
10.0000 mg | ORAL_TABLET | Freq: Once | ORAL | Status: AC
  Administered 2020-11-22: 11:00:00 10 mg via ORAL
  Filled 2020-11-22: qty 1

## 2020-11-22 NOTE — TOC Progression Note (Signed)
Transition of Care [facility]) - Progression Note    Patient Details  Name: [name] MRN: [id number] Date of Birth: 1941-08-10  Transition of [facility]) CM/SW Contact  Truitt Merle, LCSW Phone Number: 11/22/2020, 4:39 PM  Clinical Narrative:    Patient ready for discharge today. Unable to speak with patient. Spoke with patient's wife who stated she would have patient to return call.         Expected Discharge Plan and Services           Expected Discharge Date: 11/22/20                                     Social Determinants of Health (SDOH) Interventions    Readmission Risk Interventions No flowsheet data found.

## 2020-11-22 NOTE — Discharge Summary (Signed)
[name] YIF:[id number] DOB: [birth year] DOA: 11/18/2020  PCP: [name], MD  Admit date: 11/18/2020 Discharge date: 11/22/2020  Admitted From: Home Disposition: Home  Recommendations for Outpatient Follow-up:  1. Follow up with PCP in 1 week 2. Please obtain BMP/CBC in one week 3. Urology in 1 week  Home Health: PT   Discharge Condition:Stable CODE STATUS: Full Diet recommendation: Heart Healthy Brief/Interim Summary: [name] is a 79 y.o. male with medical history significant for symptomatic pvcs, left URS/LL/uretral stent placement and with stent removal in 10/2004, right ESWL in 12/2014, presented to the ED for chief concern of right flank pain. Vaccinations: he endorses two covid vaccinations with pfizer Patient was found with severe sepsis .Was admitted to ICU/Stepdown unit.  Had a CT renal stone study done revealing 1.obstructing calculus in the distal RIGHT ureter with mild hydroureter.2. Bilateral nephrolithiasis  Urology was consulted.  He was started on IV antibiotics.  Critical care was consulted, patient was started on Levophed infusion and IV fluids  Severe Sepsis -with organ dysfunction, AKI,withhypotension Leukocytosis  - Lactic acid 5.6, WBC 32.3 on admission Urology consulted-patient was taken to the OR -status post emergent right urethral stent due to obstructing right renal stone in the setting of sepsis Was weaned off of Levophed and ICU transfer to the medical floor Has remained hemodynamically stable Foley was discontinued. Blood cultures positive for Klebsiella pneumonia-ID was consulted ID recommended ceftriaxone as inpatient and levaquin on discharge, which was renal dosed to complete total 14days of abx from admission. Leukocytosis resolved, remained afebrile.  Per urology will require definite stone treatment after discharge. Plan for outpatient ureteroscopy with laser lithotripsy and stent exchange in 2 to 3 weeks He was mildly sob from  ivf /pressor he had received, was given low dose lasix and K supplement prior to discharge.02 sat on RA stable.       AKI/Metabolic acidosis - secondary toseveresepsis/and right obstructing stone Improving. Creatinine down from 2.34 to 1.30 Continue to monitor   Hypertension-continue home meds  BPH-continue home meds   Discharge Diagnoses:  Principal Problem:   Sepsis associated hypotension ([place]) Active Problems:   Symptomatic PVCs   BPH with urinary obstruction   Aorto-iliac atherosclerosis ([facility])   HLD (hyperlipidemia)   Fatigue   Anxiety   Sepsis ([place])    Discharge Instructions  Discharge Instructions    Call MD for:  temperature >100.4   Complete by: As directed    Diet - low sodium heart healthy   Complete by: As directed    Discharge instructions   Complete by: As directed    Follow up with urology and pcp in one week. Need blood work Need to start your antibiotics on 12/12.   Increase activity slowly   Complete by: As directed    No wound care   Complete by: As directed      Allergies as of 11/22/2020   No Known Allergies     Medication List    STOP taking these medications   ciprofloxacin 500 MG tablet Commonly known as: CIPRO   fluconazole 200 MG tablet Commonly known as: DIFLUCAN     TAKE these medications   CALCIUM 600+D3 PO Take 1 tablet by mouth daily with breakfast.   finasteride 5 MG tablet Commonly known as: Proscar Take 1 tablet (5 mg total) by mouth daily.   levofloxacin 250 MG tablet Commonly known as: Levaquin Take 1 tablet (250 mg total) by mouth daily for 9 days. Start taking on: November 23, 2020   metoprolol succinate 25 MG 24 hr tablet Commonly known as: TOPROL-XL TAKE 1/2 TABLET EVERY DAY What changed: when to take this   multivitamin with minerals Tabs tablet Take 1 tablet by mouth daily with breakfast.   tamsulosin 0.4 MG Caps capsule Commonly known as: FLOMAX Take 1 capsule (0.4 mg total) by  mouth daily.       Follow-up Information    Stoioff, Ronda Fairly, MD Follow up in 1 week(s).   Specialty: Urology Why: hospital f/u Contact information: [place] [address] [phone]        [name], MD Follow up in 1 week(s).   Specialty: Family Medicine Why: blood work Sport and exercise psychologist information: [address] [place] [address] [phone]              No Known Allergies  Consultations:  ID, PCCM, urology   Procedures/Studies: Abdomen 1 view (KUB)  Result Date: 11/18/2020 CLINICAL DATA:  Right-sided kidney stone unable to urinate EXAM: ABDOMEN - 1 VIEW COMPARISON:  07/15/2020, CT 11/18/2020 FINDINGS: Nonobstructed gas pattern. Probable scarring at the right lung base. Multiple faint calcifications projecting over bilateral kidneys consistent with renal calculi. 4 mm calcification in the right pelvis may correspond to the CT demonstrated right ureteral stone. IMPRESSION: 1. 4 mm calcification in the right pelvis likely corresponds to CT demonstrated distal ureteral stone. 2. Bilateral nephrolithiasis. Electronically Signed   By: Donavan Foil M.D.   On: 11/18/2020 17:58   US RENAL  Result Date: 11/21/2020 CLINICAL DATA:  Hydronephrosis EXAM: RENAL / URINARY TRACT ULTRASOUND COMPLETE COMPARISON:  CT 11/18/2020 FINDINGS: Right Kidney: Renal measurements: 11.9 x 5.7 x 5.1 cm = volume: 180 mL. Cortical echogenicity within normal limits. No hydronephrosis. Multiple echogenic foci within the mid upper and lower pole right kidney likely corresponding to multiple kidney stones, the largest measures 6 mm. Left Kidney: Renal measurements: 11.7 x 5.7 x 5.6 cm = volume: 192 mL. Cortical echogenicity normal. No hydronephrosis. Cyst at the midpole measuring 2.7 cm. Echogenic focus lower pole measuring 4 mm, likely a kidney stone. Bladder: Appears normal for degree of bladder distention. Other: None. IMPRESSION: 1. No significant  hydronephrosis. 2. Echogenic foci within the kidneys likely corresponding to kidney stones. Electronically Signed   By: Donavan Foil M.D.   On: 11/21/2020 23:25   DG Chest Port 1 View  Result Date: 11/21/2020 CLINICAL DATA:  Shortness of breath EXAM: PORTABLE CHEST - 1 VIEW COMPARISON:  11/18/2020 FINDINGS: Relatively low lung volumes. Increase in mild diffuse bilateral interstitial edema/infiltrates with patchy airspace opacities in the lung bases. Blunting of the lateral costophrenic angles suggesting effusions. Heart size upper limits normal.  Tortuous thoracic aorta as before. No pneumothorax. Visualized bones unremarkable. Right ureteral catheter partially visualized. IMPRESSION: Worsening bilateral interstitial edema/infiltrates and small pleural effusions. Electronically Signed   By: Lucrezia Europe M.D.   On: 11/21/2020 14:23   DG Chest Portable 1 View  Result Date: 11/18/2020 CLINICAL DATA:  Low oxygen saturation EXAM: PORTABLE CHEST 1 VIEW COMPARISON:  None. FINDINGS: Linear scarring or atelectasis at the right base. No consolidation or effusion. Small hiatal hernia. Cardiomediastinal silhouette within normal limits. Aortic atherosclerosis. No pneumothorax. Partially visualized sclerotic lesion in the proximal right humerus IMPRESSION: 1. Linear scarring or atelectasis at the right base. No acute infiltrate or edema. 2. Small hiatal hernia. Electronically Signed   By: Donavan Foil M.D.   On: 11/18/2020 17:53   DG OR UROLOGY CYSTO  IMAGE ([place])  Result Date: 11/18/2020 There is no interpretation for this exam.  This order is for images obtained during a surgical procedure.  Please See "Surgeries" Tab for more information regarding the procedure.   CT Renal Stone Study  Result Date: 11/18/2020 CLINICAL DATA:  Flank pain, history of nephrolithiasis. EXAM: CT ABDOMEN AND PELVIS WITHOUT CONTRAST TECHNIQUE: Multidetector CT imaging of the abdomen and pelvis was performed following the standard  protocol without IV contrast. COMPARISON:  CT 08/26/2020 FINDINGS: Lower chest: Lung bases are clear. Hepatobiliary: Low-density lesion in the RIGHT hepatic lobe measuring 8 mm unchanged from prior. Multiple gallstones within a noninflamed gallbladder. Pancreas: Pancreas is normal. No ductal dilatation. No pancreatic inflammation. Spleen: Normal spleen Adrenals/urinary tract: Adrenal glands normal. Ten nonobstructing calculi present in the RIGHT kidney ranging size from 1 to 4 mm. There is hydroureter on the RIGHT secondary to obstructing calculus in the distal RIGHT ureter measuring 3 mm on image 73/2. This RIGHT distal ureteral calculus is approximately 3 cm from the vesicoureteral junction. Similar multiple calcifications in the LEFT kidney. No LEFT ureterolithiasis. No bladder calculi. Foley catheter is positioned within a collapsed bladder. Low-density exophytic lesion posterior to the LEFT kidney likely represents a benign cyst (20 mm, image 35/2). Stomach/Bowel: Moderate hiatal hernia. Stomach, small bowel, appendix, and cecum are normal. The colon and rectosigmoid colon are normal. Vascular/Lymphatic: Abdominal aorta is normal caliber with atherosclerotic calcification. There is no retroperitoneal or periportal lymphadenopathy. No pelvic lymphadenopathy. Reproductive: Prostate unremarkable Other: No free fluid. Musculoskeletal: No aggressive osseous lesion. IMPRESSION: 1. Obstructing calculus in the distal RIGHT ureter with mild hydroureter. 2. Bilateral nephrolithiasis. 3. Foley catheter positioned within a collapsed bladder. 4. Cholelithiasis without evidence cholecystitis. 5. Aortic Atherosclerosis (ICD10-I70.0). Electronically Signed   By: Suzy Bouchard M.D.   On: 11/18/2020 17:48       Subjective: Has no complaints. Ready to go home. No cp, abd pain, fever or chills.   Discharge Exam: Vitals:   11/21/20 2336 11/22/20 0733  BP: (!) 154/80 (!) 145/94  Pulse: 90 71  Resp: (!) 22 18  Temp:  99.1 F (37.3 C) 98 F (36.7 C)  SpO2: 95% 95%   Vitals:   11/21/20 1233 11/21/20 2115 11/21/20 2336 11/22/20 0733  BP: (!) 145/80 140/78 (!) 154/80 (!) 145/94  Pulse: 91 87 90 71  Resp: (!) 24 (!) 24 (!) 22 18  Temp: 98 F (36.7 C) 98.7 F (37.1 C) 99.1 F (37.3 C) 98 F (36.7 C)  TempSrc:   Oral   SpO2: 94% 94% 95% 95%  Weight:      Height:        General: Pt is alert, awake, not in acute distress Cardiovascular: RRR, S1/S2 +, no rubs, no gallops Respiratory: CTA bilaterally, no wheezing, no rhonchi decrease bs at b/l bases Abdominal: Soft, NT, ND, bowel sounds + Extremities: no edema, no cyanosis    The results of significant diagnostics from this hospitalization (including imaging, microbiology, ancillary and laboratory) are listed below for reference.     Microbiology: Recent Results (from the past 240 hour(s))  Anaerobic culture     Status: None (Preliminary result)   Collection Time: 11/18/20  4:11 PM   Specimen: PATH Other; Tissue  Result Value Ref Range Status   Specimen Description   Final    URINE, RANDOM Performed at [facility], [address] [place], [place] [address]    Special Requests RIGHT RENAL PELVIS  Final   Gram Stain  Final    ABUNDANT WBC PRESENT, PREDOMINANTLY PMN ABUNDANT GRAM NEGATIVE RODS    Culture   Final    CULTURE REINCUBATED FOR BETTER GROWTH Performed at [facility], [place] [address], [place], [place] [address]    Report Status PENDING  Incomplete  Culture, blood (routine x 2)     Status: Abnormal   Collection Time: 11/18/20  4:35 PM   Specimen: BLOOD  Result Value Ref Range Status   Specimen Description   Final    BLOOD BLOOD LEFT FOREARM Performed at [facility], [address], [place], [place] [address]    Special Requests   Final    BOTTLES DRAWN AEROBIC AND ANAEROBIC Blood Culture adequate volume Performed at [facility], [address] [place], [place] [address]     Culture  Setup Time   Final    GRAM NEGATIVE RODS IN BOTH AEROBIC AND ANAEROBIC BOTTLES Organism ID to follow CRITICAL RESULT CALLED TO, READ BACK BY AND VERIFIED WITH: Hart Robinsons [facility] [id number] 11/19/20 HNM Performed at [facility], [address], [place], [place] [address]    Culture KLEBSIELLA PNEUMONIAE (A)  Final   Report Status 11/21/2020 FINAL  Final   Organism ID, Bacteria KLEBSIELLA PNEUMONIAE  Final      Susceptibility   Klebsiella pneumoniae - MIC*    AMPICILLIN >=32 RESISTANT Resistant     CEFAZOLIN <=4 SENSITIVE Sensitive     CEFEPIME <=0.12 SENSITIVE Sensitive     CEFTAZIDIME <=1 SENSITIVE Sensitive     CEFTRIAXONE <=0.25 SENSITIVE Sensitive     CIPROFLOXACIN <=0.25 SENSITIVE Sensitive     GENTAMICIN <=1 SENSITIVE Sensitive     IMIPENEM <=0.25 SENSITIVE Sensitive     TRIMETH/SULFA <=20 SENSITIVE Sensitive     AMPICILLIN/SULBACTAM 8 SENSITIVE Sensitive     PIP/TAZO 8 SENSITIVE Sensitive     * KLEBSIELLA PNEUMONIAE  Urine culture     Status: Abnormal   Collection Time: 11/18/20  4:35 PM   Specimen: Urine, Random  Result Value Ref Range Status   Specimen Description   Final    URINE, RANDOM Performed at [facility], [address], [place], [place] [address]    Special Requests   Final    NONE Performed at [facility], [address] [place], [place] [address]    Culture 50,000 COLONIES/mL KLEBSIELLA PNEUMONIAE (A)  Final   Report Status 11/21/2020 FINAL  Final   Organism ID, Bacteria KLEBSIELLA PNEUMONIAE (A)  Final      Susceptibility   Klebsiella pneumoniae - MIC*    AMPICILLIN >=32 RESISTANT Resistant     CEFAZOLIN <=4 SENSITIVE Sensitive     CEFEPIME <=0.12 SENSITIVE Sensitive     CEFTRIAXONE <=0.25 SENSITIVE Sensitive     CIPROFLOXACIN <=0.25 SENSITIVE Sensitive     GENTAMICIN <=1 SENSITIVE Sensitive     IMIPENEM <=0.25 SENSITIVE Sensitive     NITROFURANTOIN 128 RESISTANT Resistant     TRIMETH/SULFA <=20 SENSITIVE  Sensitive     AMPICILLIN/SULBACTAM 8 SENSITIVE Sensitive     PIP/TAZO 8 SENSITIVE Sensitive     * 50,000 COLONIES/mL KLEBSIELLA PNEUMONIAE  Blood Culture ID Panel (Reflexed)     Status: Abnormal   Collection Time: 11/18/20  4:35 PM  Result Value Ref Range Status   Enterococcus faecalis NOT DETECTED NOT DETECTED Final   Enterococcus Faecium NOT DETECTED NOT DETECTED Final   Listeria monocytogenes NOT DETECTED NOT DETECTED Final   Staphylococcus species NOT DETECTED NOT DETECTED Final  Staphylococcus aureus (BCID) NOT DETECTED NOT DETECTED Final   Staphylococcus epidermidis NOT DETECTED NOT DETECTED Final   Staphylococcus lugdunensis NOT DETECTED NOT DETECTED Final   Streptococcus species NOT DETECTED NOT DETECTED Final   Streptococcus agalactiae NOT DETECTED NOT DETECTED Final   Streptococcus pneumoniae NOT DETECTED NOT DETECTED Final   Streptococcus pyogenes NOT DETECTED NOT DETECTED Final   A.calcoaceticus-baumannii NOT DETECTED NOT DETECTED Final   Bacteroides fragilis NOT DETECTED NOT DETECTED Final   Enterobacterales DETECTED (A) NOT DETECTED Final    Comment: Enterobacterales represent a large order of gram negative bacteria, not a single organism. CRITICAL RESULT CALLED TO, READ BACK BY AND VERIFIED WITH: SCOTT HALL PHARMD [id number] 11/19/20 HNM    Enterobacter cloacae complex NOT DETECTED NOT DETECTED Final   Escherichia coli NOT DETECTED NOT DETECTED Final   Klebsiella aerogenes NOT DETECTED NOT DETECTED Final   Klebsiella oxytoca NOT DETECTED NOT DETECTED Final   Klebsiella pneumoniae DETECTED (A) NOT DETECTED Final    Comment: CRITICAL RESULT CALLED TO, READ BACK BY AND VERIFIED WITH: Hart Robinsons PHARMD 0459 11/19/20 HNM    Proteus species NOT DETECTED NOT DETECTED Final   Salmonella species NOT DETECTED NOT DETECTED Final   Serratia marcescens NOT DETECTED NOT DETECTED Final   Haemophilus influenzae NOT DETECTED NOT DETECTED Final   Neisseria meningitidis NOT DETECTED NOT  DETECTED Final   Pseudomonas aeruginosa NOT DETECTED NOT DETECTED Final   Stenotrophomonas maltophilia NOT DETECTED NOT DETECTED Final   Candida albicans NOT DETECTED NOT DETECTED Final   Candida auris NOT DETECTED NOT DETECTED Final   Candida glabrata NOT DETECTED NOT DETECTED Final   Candida krusei NOT DETECTED NOT DETECTED Final   Candida parapsilosis NOT DETECTED NOT DETECTED Final   Candida tropicalis NOT DETECTED NOT DETECTED Final   Cryptococcus neoformans/gattii NOT DETECTED NOT DETECTED Final   CTX-M ESBL NOT DETECTED NOT DETECTED Final   Carbapenem resistance IMP NOT DETECTED NOT DETECTED Final   Carbapenem resistance KPC NOT DETECTED NOT DETECTED Final   Carbapenem resistance NDM NOT DETECTED NOT DETECTED Final   Carbapenem resist OXA 48 LIKE NOT DETECTED NOT DETECTED Final   Carbapenem resistance VIM NOT DETECTED NOT DETECTED Final    Comment: Performed at [facility], [address] [place], [place] [address]  Culture, blood (routine x 2)     Status: Abnormal   Collection Time: 11/18/20  4:36 PM   Specimen: BLOOD  Result Value Ref Range Status   Specimen Description   Final    BLOOD RIGHT ANTECUBITAL Performed at [facility], [address], [place], [place] [address]    Special Requests   Final    BOTTLES DRAWN AEROBIC AND ANAEROBIC Blood Culture adequate volume Performed at [facility], [address] [place], [place] [address]    Culture  Setup Time   Final    GRAM NEGATIVE RODS IN BOTH AEROBIC AND ANAEROBIC BOTTLES CRITICAL VALUE NOTED.  VALUE IS CONSISTENT WITH PREVIOUSLY REPORTED AND CALLED VALUE. Performed at [facility], [address] [place], [place] [address]    Culture (A)  Final    KLEBSIELLA PNEUMONIAE SUSCEPTIBILITIES PERFORMED ON PREVIOUS CULTURE WITHIN THE LAST 5 DAYS. Performed at [facility], [place] [address], [place], [place] [address]    Report Status 11/21/2020 FINAL  Final  Resp Panel by  RT-PCR (Flu A&B, Covid) Nasopharyngeal Swab     Status: None   Collection Time: 11/18/20  4:36 PM   Specimen: Nasopharyngeal Swab;  Nasopharyngeal(NP) swabs in vial transport medium  Result Value Ref Range Status   SARS Coronavirus 2 by RT PCR NEGATIVE NEGATIVE Final    Comment: (NOTE) SARS-CoV-2 target nucleic acids are NOT DETECTED.  The SARS-CoV-2 RNA is generally detectable in upper respiratory specimens during the acute phase of infection. The lowest concentration of SARS-CoV-2 viral copies this assay can detect is 138 copies/mL. A negative result does not preclude SARS-Cov-2 infection and should not be used as the sole basis for treatment or other patient management decisions. A negative result may occur with  improper specimen collection/handling, submission of specimen other than nasopharyngeal swab, presence of viral mutation(s) within the areas targeted by this assay, and inadequate number of viral copies(<138 copies/mL). A negative result must be combined with clinical observations, patient history, and epidemiological information. The expected result is Negative.  Fact Sheet for Patients:  EntrepreneurPulse.com.au  Fact Sheet for Healthcare Providers:  IncredibleEmployment.be  This test is no t yet approved or cleared by the [place] FDA and  has been authorized for detection and/or diagnosis of SARS-CoV-2 by FDA under an Emergency Use Authorization (EUA). This EUA will remain  in effect (meaning this test can be used) for the duration of the COVID-19 declaration under Section 564(b)(1) of the Act, 21 U.S.C.section 360bbb-3(b)(1), unless the authorization is terminated  or revoked sooner.       Influenza A by PCR NEGATIVE NEGATIVE Final   Influenza B by PCR NEGATIVE NEGATIVE Final    Comment: (NOTE) The Xpert Xpress SARS-CoV-2/FLU/RSV plus assay is intended as an aid in the diagnosis of influenza from Nasopharyngeal swab  specimens and should not be used as a sole basis for treatment. Nasal washings and aspirates are unacceptable for Xpert Xpress SARS-CoV-2/FLU/RSV testing.  Fact Sheet for Patients: EntrepreneurPulse.com.au  Fact Sheet for Healthcare Providers: IncredibleEmployment.be  This test is not yet approved or cleared by the [place] FDA and has been authorized for detection and/or diagnosis of SARS-CoV-2 by FDA under an Emergency Use Authorization (EUA). This EUA will remain in effect (meaning this test can be used) for the duration of the COVID-19 declaration under Section 564(b)(1) of the Act, 21 U.S.C. section 360bbb-3(b)(1), unless the authorization is terminated or revoked.  Performed at [facility], [address] [place], [place] [address]   Urine Culture     Status: Abnormal   Collection Time: 11/18/20  8:12 PM   Specimen: Urine, Random  Result Value Ref Range Status   Specimen Description URINE, RANDOM  Final   Special Requests   Final    RIGHT RENAL PELVIS Performed at [facility], [place] [address] [place], [place] [address]    Culture >=100,000 COLONIES/mL KLEBSIELLA PNEUMONIAE (A)  Final   Report Status 11/21/2020 FINAL  Final   Organism ID, Bacteria KLEBSIELLA PNEUMONIAE (A)  Final      Susceptibility   Klebsiella pneumoniae - MIC*    AMPICILLIN >=32 RESISTANT Resistant     CEFAZOLIN <=4 SENSITIVE Sensitive     CEFEPIME <=0.12 SENSITIVE Sensitive     CEFTRIAXONE <=0.25 SENSITIVE Sensitive     CIPROFLOXACIN <=0.25 SENSITIVE Sensitive     GENTAMICIN <=1 SENSITIVE Sensitive     IMIPENEM <=0.25 SENSITIVE Sensitive     NITROFURANTOIN 128 RESISTANT Resistant     TRIMETH/SULFA <=20 SENSITIVE Sensitive     AMPICILLIN/SULBACTAM 8 SENSITIVE Sensitive     PIP/TAZO 8 SENSITIVE Sensitive     * >=100,000 COLONIES/mL KLEBSIELLA PNEUMONIAE  Blood culture (routine single)  Status: None (Preliminary result)   Collection  Time: 11/18/20  9:26 PM   Specimen: BLOOD  Result Value Ref Range Status   Specimen Description BLOOD RIGHT ANTECUBITAL  Final   Special Requests   Final    BOTTLES DRAWN AEROBIC AND ANAEROBIC Blood Culture adequate volume   Culture   Final    NO GROWTH 4 DAYS Performed at [facility], [address] [place], [place] [address]    Report Status PENDING  Incomplete  MRSA PCR Screening     Status: None   Collection Time: 11/18/20  9:55 PM   Specimen: Nasal Mucosa; Nasopharyngeal  Result Value Ref Range Status   MRSA by PCR NEGATIVE NEGATIVE Final    Comment:        The GeneXpert MRSA Assay (FDA approved for NASAL specimens only), is one component of a comprehensive MRSA colonization surveillance program. It is not intended to diagnose MRSA infection nor to guide or monitor treatment for MRSA infections. Performed at [facility], [address] [place], [place] [address]      Labs: BNP (last 3 results) Recent Labs    11/18/20 1635  BNP 449.6*   Basic Metabolic Panel: Recent Labs  Lab 11/18/20 1635 11/19/20 0624 11/20/20 0649 11/21/20 0500 11/22/20 0822  NA 139 142 142 142 140  K 3.7 4.2 3.8 3.7 3.4*  CL 104 111 111 109 107  CO2 18* 20* 22 23 24   GLUCOSE 138* 114* 111* 102* 106*  BUN 35* 38* 38* 38* 37*  CREATININE 3.43* 2.34* 1.57* 1.38* 1.30*  CALCIUM 8.7* 7.8* 8.1* 8.0* 7.8*  MG  --  1.6*  --   --   --    Liver Function Tests: Recent Labs  Lab 11/18/20 1635  AST 39  ALT 13  ALKPHOS 60  BILITOT 1.0  PROT 7.1  ALBUMIN 3.5   Recent Labs  Lab 11/18/20 1635  LIPASE 18   No results for input(s): AMMONIA in the last 168 hours. CBC: Recent Labs  Lab 11/18/20 1635 11/19/20 0624 11/20/20 0649 11/21/20 0500 11/22/20 0822  WBC 32.3* 24.8* 20.8* 19.2* 9.8  NEUTROABS 29.1*  --   --   --   --   HGB 13.9 11.6* 12.0* 12.7* 11.5*  HCT 42.2 34.7* 35.3* 36.4* 34.4*  MCV 90.2 89.2 87.4 85.6 86.9  PLT 133* 78* 65* 69* 74*    Cardiac Enzymes: No results for input(s): CKTOTAL, CKMB, CKMBINDEX, TROPONINI in the last 168 hours. BNP: Invalid input(s): POCBNP CBG: Recent Labs  Lab 11/18/20 2123 11/19/20 1226  GLUCAP 95 127*   D-Dimer No results for input(s): DDIMER in the last 72 hours. Hgb A1c No results for input(s): HGBA1C in the last 72 hours. Lipid Profile No results for input(s): CHOL, HDL, LDLCALC, TRIG, CHOLHDL, LDLDIRECT in the last 72 hours. Thyroid function studies No results for input(s): TSH, T4TOTAL, T3FREE, THYROIDAB in the last 72 hours.  Invalid input(s): FREET3 Anemia work up No results for input(s): VITAMINB12, FOLATE, FERRITIN, TIBC, IRON, RETICCTPCT in the last 72 hours. Urinalysis    Component Value Date/Time   COLORURINE STRAW (A) 11/18/2020 1635   APPEARANCEUR CLOUDY (A) 11/18/2020 1635   APPEARANCEUR Cloudy (A) 08/21/2020 1148   LABSPEC 1.004 (L) 11/18/2020 1635   LABSPEC 1.025 10/07/2014 1927   PHURINE 5.0 11/18/2020 1635   GLUCOSEU NEGATIVE 11/18/2020 1635   GLUCOSEU Negative 10/07/2014 1927   HGBUR LARGE (A) 11/18/2020 1635   BILIRUBINUR NEGATIVE 11/18/2020 1635   BILIRUBINUR Negative  08/21/2020 [place] Negative 10/07/2014 1927   KETONESUR NEGATIVE 11/18/2020 1635   PROTEINUR NEGATIVE 11/18/2020 1635   NITRITE NEGATIVE 11/18/2020 1635   LEUKOCYTESUR LARGE (A) 11/18/2020 1635   LEUKOCYTESUR Negative 10/07/2014 1927   Sepsis Labs Invalid input(s): PROCALCITONIN,  WBC,  LACTICIDVEN Microbiology Recent Results (from the past 240 hour(s))  Anaerobic culture     Status: None (Preliminary result)   Collection Time: 11/18/20  4:11 PM   Specimen: PATH Other; Tissue  Result Value Ref Range Status   Specimen Description   Final    URINE, RANDOM Performed at [facility], [address] [place], [place] [address]    Special Requests RIGHT RENAL PELVIS  Final   Gram Stain   Final    ABUNDANT WBC PRESENT, PREDOMINANTLY PMN ABUNDANT GRAM NEGATIVE  RODS    Culture   Final    CULTURE REINCUBATED FOR BETTER GROWTH Performed at [facility], [place] [address], [place], [place] [address]    Report Status PENDING  Incomplete  Culture, blood (routine x 2)     Status: Abnormal   Collection Time: 11/18/20  4:35 PM   Specimen: BLOOD  Result Value Ref Range Status   Specimen Description   Final    BLOOD BLOOD LEFT FOREARM Performed at [facility], [address] [place], [place] [address]    Special Requests   Final    BOTTLES DRAWN AEROBIC AND ANAEROBIC Blood Culture adequate volume Performed at [facility], [address], [place], [place] [address]    Culture  Setup Time   Final    GRAM NEGATIVE RODS IN BOTH AEROBIC AND ANAEROBIC BOTTLES Organism ID to follow CRITICAL RESULT CALLED TO, READ BACK BY AND VERIFIED WITH: Hart Robinsons [facility] [id number] 11/19/20 HNM Performed at [facility], [address] [place], [place] [address]    Culture KLEBSIELLA PNEUMONIAE (A)  Final   Report Status 11/21/2020 FINAL  Final   Organism ID, Bacteria KLEBSIELLA PNEUMONIAE  Final      Susceptibility   Klebsiella pneumoniae - MIC*    AMPICILLIN >=32 RESISTANT Resistant     CEFAZOLIN <=4 SENSITIVE Sensitive     CEFEPIME <=0.12 SENSITIVE Sensitive     CEFTAZIDIME <=1 SENSITIVE Sensitive     CEFTRIAXONE <=0.25 SENSITIVE Sensitive     CIPROFLOXACIN <=0.25 SENSITIVE Sensitive     GENTAMICIN <=1 SENSITIVE Sensitive     IMIPENEM <=0.25 SENSITIVE Sensitive     TRIMETH/SULFA <=20 SENSITIVE Sensitive     AMPICILLIN/SULBACTAM 8 SENSITIVE Sensitive     PIP/TAZO 8 SENSITIVE Sensitive     * KLEBSIELLA PNEUMONIAE  Urine culture     Status: Abnormal   Collection Time: 11/18/20  4:35 PM   Specimen: Urine, Random  Result Value Ref Range Status   Specimen Description   Final    URINE, RANDOM Performed at [facility], [address], [place], [place] [address]    Special Requests   Final    NONE Performed at  [facility], [address] [place], [place] [address]    Culture 50,000 COLONIES/mL KLEBSIELLA PNEUMONIAE (A)  Final   Report Status 11/21/2020 FINAL  Final   Organism ID, Bacteria KLEBSIELLA PNEUMONIAE (A)  Final      Susceptibility   Klebsiella pneumoniae - MIC*    AMPICILLIN >=32 RESISTANT Resistant     CEFAZOLIN <=4 SENSITIVE Sensitive     CEFEPIME <=0.12 SENSITIVE Sensitive     CEFTRIAXONE <=0.25  SENSITIVE Sensitive     CIPROFLOXACIN <=0.25 SENSITIVE Sensitive     GENTAMICIN <=1 SENSITIVE Sensitive     IMIPENEM <=0.25 SENSITIVE Sensitive     NITROFURANTOIN 128 RESISTANT Resistant     TRIMETH/SULFA <=20 SENSITIVE Sensitive     AMPICILLIN/SULBACTAM 8 SENSITIVE Sensitive     PIP/TAZO 8 SENSITIVE Sensitive     * 50,000 COLONIES/mL KLEBSIELLA PNEUMONIAE  Blood Culture ID Panel (Reflexed)     Status: Abnormal   Collection Time: 11/18/20  4:35 PM  Result Value Ref Range Status   Enterococcus faecalis NOT DETECTED NOT DETECTED Final   Enterococcus Faecium NOT DETECTED NOT DETECTED Final   Listeria monocytogenes NOT DETECTED NOT DETECTED Final   Staphylococcus species NOT DETECTED NOT DETECTED Final   Staphylococcus aureus (BCID) NOT DETECTED NOT DETECTED Final   Staphylococcus epidermidis NOT DETECTED NOT DETECTED Final   Staphylococcus lugdunensis NOT DETECTED NOT DETECTED Final   Streptococcus species NOT DETECTED NOT DETECTED Final   Streptococcus agalactiae NOT DETECTED NOT DETECTED Final   Streptococcus pneumoniae NOT DETECTED NOT DETECTED Final   Streptococcus pyogenes NOT DETECTED NOT DETECTED Final   A.calcoaceticus-baumannii NOT DETECTED NOT DETECTED Final   Bacteroides fragilis NOT DETECTED NOT DETECTED Final   Enterobacterales DETECTED (A) NOT DETECTED Final    Comment: Enterobacterales represent a large order of gram negative bacteria, not a single organism. CRITICAL RESULT CALLED TO, READ BACK BY AND VERIFIED WITH: SCOTT HALL PHARMD [id number] 11/19/20 HNM     Enterobacter cloacae complex NOT DETECTED NOT DETECTED Final   Escherichia coli NOT DETECTED NOT DETECTED Final   Klebsiella aerogenes NOT DETECTED NOT DETECTED Final   Klebsiella oxytoca NOT DETECTED NOT DETECTED Final   Klebsiella pneumoniae DETECTED (A) NOT DETECTED Final    Comment: CRITICAL RESULT CALLED TO, READ BACK BY AND VERIFIED WITH: Hart Robinsons PHARMD [id number] 11/19/20 HNM    Proteus species NOT DETECTED NOT DETECTED Final   Salmonella species NOT DETECTED NOT DETECTED Final   Serratia marcescens NOT DETECTED NOT DETECTED Final   Haemophilus influenzae NOT DETECTED NOT DETECTED Final   Neisseria meningitidis NOT DETECTED NOT DETECTED Final   Pseudomonas aeruginosa NOT DETECTED NOT DETECTED Final   Stenotrophomonas maltophilia NOT DETECTED NOT DETECTED Final   Candida albicans NOT DETECTED NOT DETECTED Final   Candida auris NOT DETECTED NOT DETECTED Final   Candida glabrata NOT DETECTED NOT DETECTED Final   Candida krusei NOT DETECTED NOT DETECTED Final   Candida parapsilosis NOT DETECTED NOT DETECTED Final   Candida tropicalis NOT DETECTED NOT DETECTED Final   Cryptococcus neoformans/gattii NOT DETECTED NOT DETECTED Final   CTX-M ESBL NOT DETECTED NOT DETECTED Final   Carbapenem resistance IMP NOT DETECTED NOT DETECTED Final   Carbapenem resistance KPC NOT DETECTED NOT DETECTED Final   Carbapenem resistance NDM NOT DETECTED NOT DETECTED Final   Carbapenem resist OXA 48 LIKE NOT DETECTED NOT DETECTED Final   Carbapenem resistance VIM NOT DETECTED NOT DETECTED Final    Comment: Performed at [facility], [address] [place], [place] [address]  Culture, blood (routine x 2)     Status: Abnormal   Collection Time: 11/18/20  4:36 PM   Specimen: BLOOD  Result Value Ref Range Status   Specimen Description   Final    BLOOD RIGHT ANTECUBITAL Performed at [facility], [address] [place], [place] [address]    Special Requests   Final    BOTTLES DRAWN  AEROBIC AND ANAEROBIC Blood Culture adequate volume Performed  at [facility], [address], [place], [place] [address]    Culture  Setup Time   Final    GRAM NEGATIVE RODS IN BOTH AEROBIC AND ANAEROBIC BOTTLES CRITICAL VALUE NOTED.  VALUE IS CONSISTENT WITH PREVIOUSLY REPORTED AND CALLED VALUE. Performed at [facility], [address] [place], [place] [address]    Culture (A)  Final    KLEBSIELLA PNEUMONIAE SUSCEPTIBILITIES PERFORMED ON PREVIOUS CULTURE WITHIN THE LAST 5 DAYS. Performed at [facility], [place] [address] [place], [place] [address]    Report Status 11/21/2020 FINAL  Final  Resp Panel by RT-PCR (Flu A&B, Covid) Nasopharyngeal Swab     Status: None   Collection Time: 11/18/20  4:36 PM   Specimen: Nasopharyngeal Swab; Nasopharyngeal(NP) swabs in vial transport medium  Result Value Ref Range Status   SARS Coronavirus 2 by RT PCR NEGATIVE NEGATIVE Final    Comment: (NOTE) SARS-CoV-2 target nucleic acids are NOT DETECTED.  The SARS-CoV-2 RNA is generally detectable in upper respiratory specimens during the acute phase of infection. The lowest concentration of SARS-CoV-2 viral copies this assay can detect is 138 copies/mL. A negative result does not preclude SARS-Cov-2 infection and should not be used as the sole basis for treatment or other patient management decisions. A negative result may occur with  improper specimen collection/handling, submission of specimen other than nasopharyngeal swab, presence of viral mutation(s) within the areas targeted by this assay, and inadequate number of viral copies(<138 copies/mL). A negative result must be combined with clinical observations, patient history, and epidemiological information. The expected result is Negative.  Fact Sheet for Patients:  EntrepreneurPulse.com.au  Fact Sheet for Healthcare Providers:  IncredibleEmployment.be  This test is no t yet  approved or cleared by the [place] FDA and  has been authorized for detection and/or diagnosis of SARS-CoV-2 by FDA under an Emergency Use Authorization (EUA). This EUA will remain  in effect (meaning this test can be used) for the duration of the COVID-19 declaration under Section 564(b)(1) of the Act, 21 U.S.C.section 360bbb-3(b)(1), unless the authorization is terminated  or revoked sooner.       Influenza A by PCR NEGATIVE NEGATIVE Final   Influenza B by PCR NEGATIVE NEGATIVE Final    Comment: (NOTE) The Xpert Xpress SARS-CoV-2/FLU/RSV plus assay is intended as an aid in the diagnosis of influenza from Nasopharyngeal swab specimens and should not be used as a sole basis for treatment. Nasal washings and aspirates are unacceptable for Xpert Xpress SARS-CoV-2/FLU/RSV testing.  Fact Sheet for Patients: EntrepreneurPulse.com.au  Fact Sheet for Healthcare Providers: IncredibleEmployment.be  This test is not yet approved or cleared by the [place] FDA and has been authorized for detection and/or diagnosis of SARS-CoV-2 by FDA under an Emergency Use Authorization (EUA). This EUA will remain in effect (meaning this test can be used) for the duration of the COVID-19 declaration under Section 564(b)(1) of the Act, 21 U.S.C. section 360bbb-3(b)(1), unless the authorization is terminated or revoked.  Performed at [facility], [address] [place], [place] [address]   Urine Culture     Status: Abnormal   Collection Time: 11/18/20  8:12 PM   Specimen: Urine, Random  Result Value Ref Range Status   Specimen Description URINE, RANDOM  Final   Special Requests   Final    RIGHT RENAL PELVIS Performed at [facility], [place] [address] [place], [place] [address]    Culture >=100,000 COLONIES/mL KLEBSIELLA PNEUMONIAE (A)  Final  Report Status 11/21/2020 FINAL  Final   Organism ID, Bacteria KLEBSIELLA PNEUMONIAE (A)  Final       Susceptibility   Klebsiella pneumoniae - MIC*    AMPICILLIN >=32 RESISTANT Resistant     CEFAZOLIN <=4 SENSITIVE Sensitive     CEFEPIME <=0.12 SENSITIVE Sensitive     CEFTRIAXONE <=0.25 SENSITIVE Sensitive     CIPROFLOXACIN <=0.25 SENSITIVE Sensitive     GENTAMICIN <=1 SENSITIVE Sensitive     IMIPENEM <=0.25 SENSITIVE Sensitive     NITROFURANTOIN 128 RESISTANT Resistant     TRIMETH/SULFA <=20 SENSITIVE Sensitive     AMPICILLIN/SULBACTAM 8 SENSITIVE Sensitive     PIP/TAZO 8 SENSITIVE Sensitive     * >=100,000 COLONIES/mL KLEBSIELLA PNEUMONIAE  Blood culture (routine single)     Status: None (Preliminary result)   Collection Time: 11/18/20  9:26 PM   Specimen: BLOOD  Result Value Ref Range Status   Specimen Description BLOOD RIGHT ANTECUBITAL  Final   Special Requests   Final    BOTTLES DRAWN AEROBIC AND ANAEROBIC Blood Culture adequate volume   Culture   Final    NO GROWTH 4 DAYS Performed at [facility], [address] [place], [place] [address]    Report Status PENDING  Incomplete  MRSA PCR Screening     Status: None   Collection Time: 11/18/20  9:55 PM   Specimen: Nasal Mucosa; Nasopharyngeal  Result Value Ref Range Status   MRSA by PCR NEGATIVE NEGATIVE Final    Comment:        The GeneXpert MRSA Assay (FDA approved for NASAL specimens only), is one component of a comprehensive MRSA colonization surveillance program. It is not intended to diagnose MRSA infection nor to guide or monitor treatment for MRSA infections. Performed at [facility], [address], [place], [place] [address]      Time coordinating discharge: Over 30 minutes  SIGNED:   Nolberto Hanlon, MD  [facility] 11/22/2020, 10:13 AM Pager   If 7PM-7AM, please contact night-coverage

## 2020-11-23 LAB — CULTURE, BLOOD (SINGLE)
Culture: NO GROWTH
Special Requests: ADEQUATE

## 2020-11-24 ENCOUNTER — Telehealth: Payer: Self-pay

## 2020-11-24 LAB — ANAEROBIC CULTURE

## 2020-11-24 NOTE — Telephone Encounter (Signed)
Transition Care Management Follow-up Telephone Call  Date of discharge and from where: 11/22/2020, [facility]  How have you been since you were released from the hospital? Spoke with [name] (wife) HIPAA verified, Patient is doing okay. Still tired and has been resting since getting home.   Any questions or concerns? No  Items Reviewed:  Did the pt receive and understand the discharge instructions provided? Yes   Medications obtained and verified? Yes   Other? No   Any new allergies since your discharge? No   Dietary orders reviewed? Yes  Do you have support at home? Yes   Home Care and Equipment/Supplies: Were home health services ordered? not applicable If so, what is the name of the agency? N/A  Has the agency set up a time to come to the patient's home? not applicable Were any new equipment or medical supplies ordered?  No What is the name of the medical supply agency? N/A Were you able to get the supplies/equipment? not applicable Do you have any questions related to the use of the equipment or supplies? No  Functional Questionnaire: (I = Independent and D = Dependent) ADLs: I  Bathing/Dressing- I  Meal Prep- I  Eating- I  Maintaining continence- I  Transferring/Ambulation- I  Managing Meds- I  Follow up appointments reviewed:   PCP Hospital f/u appt confirmed? No  Wants to wait and schedule appointment with urology first. Will call back and schedule appointment with Dr. Danise Mina soon.  [facility] f/u appt confirmed? will be following up with urology   Are transportation arrangements needed? No   If their condition worsens, is the pt aware to call PCP or go to the Emergency Dept.? Yes  Was the patient provided with contact information for the PCP's office or ED? Yes  Was to pt encouraged to call back with questions or concerns? Yes

## 2020-11-26 ENCOUNTER — Other Ambulatory Visit: Payer: Self-pay | Admitting: Radiology

## 2020-11-26 DIAGNOSIS — N201 Calculus of ureter: Secondary | ICD-10-CM

## 2020-12-01 ENCOUNTER — Ambulatory Visit
Admission: RE | Admit: 2020-12-01 | Discharge: 2020-12-01 | Disposition: A | Discharge status: Home / Self Care | Payer: Medicare HMO | Attending: Urology | Admitting: Urology

## 2020-12-01 ENCOUNTER — Other Ambulatory Visit: Payer: Medicare HMO

## 2020-12-01 ENCOUNTER — Ambulatory Visit
Admission: RE | Admit: 2020-12-01 | Discharge: 2020-12-01 | Disposition: A | Discharge status: Home / Self Care | Payer: Medicare HMO | Source: Ambulatory Visit | Attending: Urology | Admitting: Urology

## 2020-12-01 ENCOUNTER — Other Ambulatory Visit: Payer: Self-pay

## 2020-12-01 ENCOUNTER — Telehealth: Payer: Self-pay | Admitting: Radiology

## 2020-12-01 DIAGNOSIS — M47816 Spondylosis without myelopathy or radiculopathy, lumbar region: Secondary | ICD-10-CM | POA: Diagnosis not present

## 2020-12-01 DIAGNOSIS — N200 Calculus of kidney: Secondary | ICD-10-CM | POA: Diagnosis not present

## 2020-12-01 DIAGNOSIS — N201 Calculus of ureter: Secondary | ICD-10-CM | POA: Diagnosis not present

## 2020-12-01 DIAGNOSIS — N2889 Other specified disorders of kidney and ureter: Secondary | ICD-10-CM | POA: Diagnosis not present

## 2020-12-01 NOTE — Telephone Encounter (Signed)
Patient states he thinks he no longer has a kidney stone because he has had no pain in several days. Has ureteral stent and is scheduled for ureteroscopy on 12/09/2020. Requests xray to confirm. Please advise.

## 2020-12-01 NOTE — Telephone Encounter (Signed)
Patient called back and said he was in agreement with the plan to proceed with surgery and he has not passed a stone.  Sharyn Lull

## 2020-12-01 NOTE — Telephone Encounter (Signed)
Please counsel the patient that he is not expected to have significant stone pain following ureteral stent placement, however this does not mean that the stone is no longer in place. Has he seen a stone pass? If we obtain a KUB, it is possible that the ureteral stent will obscure the stone and so this is not a definitive test. I recommend that we proceed with plans for ureteroscopy unless he has passed a large stone.

## 2020-12-02 ENCOUNTER — Ambulatory Visit (INDEPENDENT_AMBULATORY_CARE_PROVIDER_SITE_OTHER)
Admission: RE | Admit: 2020-12-02 | Discharge: 2020-12-02 | Disposition: A | Discharge status: Home / Self Care | Payer: Medicare HMO | Source: Ambulatory Visit | Attending: Family Medicine | Admitting: Family Medicine

## 2020-12-02 ENCOUNTER — Encounter: Payer: Self-pay | Admitting: Family Medicine

## 2020-12-02 ENCOUNTER — Ambulatory Visit (INDEPENDENT_AMBULATORY_CARE_PROVIDER_SITE_OTHER): Payer: Medicare HMO | Admitting: Family Medicine

## 2020-12-02 VITALS — BP 140/66 | HR 71 | Temp 97.6°F | Ht 66.0 in | Wt 142.4 lb

## 2020-12-02 DIAGNOSIS — I10 Essential (primary) hypertension: Secondary | ICD-10-CM

## 2020-12-02 DIAGNOSIS — N179 Acute kidney failure, unspecified: Secondary | ICD-10-CM

## 2020-12-02 DIAGNOSIS — N401 Enlarged prostate with lower urinary tract symptoms: Secondary | ICD-10-CM | POA: Diagnosis not present

## 2020-12-02 DIAGNOSIS — N138 Other obstructive and reflux uropathy: Secondary | ICD-10-CM

## 2020-12-02 DIAGNOSIS — N139 Obstructive and reflux uropathy, unspecified: Secondary | ICD-10-CM | POA: Insufficient documentation

## 2020-12-02 DIAGNOSIS — Z01818 Encounter for other preprocedural examination: Secondary | ICD-10-CM

## 2020-12-02 DIAGNOSIS — J90 Pleural effusion, not elsewhere classified: Secondary | ICD-10-CM | POA: Diagnosis not present

## 2020-12-02 DIAGNOSIS — I493 Ventricular premature depolarization: Secondary | ICD-10-CM | POA: Diagnosis not present

## 2020-12-02 DIAGNOSIS — N200 Calculus of kidney: Secondary | ICD-10-CM

## 2020-12-02 LAB — COMPREHENSIVE METABOLIC PANEL
ALT: 12 U/L (ref 0–53)
AST: 18 U/L (ref 0–37)
Albumin: 3.5 g/dL (ref 3.5–5.2)
Alkaline Phosphatase: 72 U/L (ref 39–117)
BUN: 21 mg/dL (ref 6–23)
CO2: 26 mEq/L (ref 19–32)
Calcium: 8.9 mg/dL (ref 8.4–10.5)
Chloride: 104 mEq/L (ref 96–112)
Creatinine, Ser: 1.37 mg/dL (ref 0.40–1.50)
GFR: 48.43 mL/min — ABNORMAL LOW (ref >60.00)
Glucose, Bld: 81 mg/dL (ref 70–99)
Potassium: 4.1 mEq/L (ref 3.5–5.1)
Sodium: 137 mEq/L (ref 135–145)
Total Bilirubin: 0.6 mg/dL (ref 0.2–1.2)
Total Protein: 7.9 g/dL (ref 6.0–8.3)

## 2020-12-02 LAB — CBC WITH DIFFERENTIAL/PLATELET
Basophils Absolute: 0.1 10*3/uL (ref 0.0–0.1)
Basophils Relative: 1 % (ref 0.0–3.0)
Eosinophils Absolute: 0.1 10*3/uL (ref 0.0–0.7)
Eosinophils Relative: 1.5 % (ref 0.0–5.0)
HCT: 39.2 % (ref 39.0–52.0)
Hemoglobin: 12.7 g/dL — ABNORMAL LOW (ref 13.0–17.0)
Lymphocytes Relative: 18.7 % (ref 12.0–46.0)
Lymphs Abs: 1.4 10*3/uL (ref 0.7–4.0)
MCHC: 32.3 g/dL (ref 30.0–36.0)
MCV: 87.7 fl (ref 78.0–100.0)
Monocytes Absolute: 0.8 10*3/uL (ref 0.1–1.0)
Monocytes Relative: 10 % (ref 3.0–12.0)
Neutro Abs: 5.3 10*3/uL (ref 1.4–7.7)
Neutrophils Relative %: 68.8 % (ref 43.0–77.0)
Platelets: 336 10*3/uL (ref 150.0–400.0)
RBC: 4.47 Mil/uL (ref 4.22–5.81)
RDW: 13.8 % (ref 11.5–15.5)
WBC: 7.7 10*3/uL (ref 4.0–10.5)

## 2020-12-02 LAB — URINALYSIS, COMPLETE
Bilirubin, UA: NEGATIVE
Glucose, UA: NEGATIVE
Ketones, UA: NEGATIVE
Nitrite, UA: NEGATIVE
Specific Gravity, UA: 1.02 (ref 1.005–1.030)
Urobilinogen, Ur: 0.2 mg/dL (ref 0.2–1.0)
pH, UA: 6 (ref 5.0–7.5)

## 2020-12-02 LAB — PROTIME-INR
INR: 1.2 ratio — ABNORMAL HIGH (ref 0.8–1.0)
Prothrombin Time: 13 s (ref 9.6–13.1)

## 2020-12-02 LAB — MICROSCOPIC EXAMINATION: RBC, Urine: >30 /hpf — AB (ref 0–2)

## 2020-12-02 NOTE — Assessment & Plan Note (Addendum)
He's not taking finasteride or flomax. Recommend restart flomax daily.

## 2020-12-02 NOTE — Assessment & Plan Note (Signed)
Resolving with stent placement.  Update labs.

## 2020-12-02 NOTE — Patient Instructions (Signed)
Labs today EKG today Xray today.  We will forward results to urology. Assuming all ok - I think you will do well with upcoming surgery.

## 2020-12-02 NOTE — Assessment & Plan Note (Signed)
Upcoming urological procedure. Check labs, EKG, CXR.  Anticipate adequately low risk to proceed with planned procedure.  He is very sensitive to narcotics and anesthetics.

## 2020-12-02 NOTE — Progress Notes (Signed)
Patient ID: [name], male    DOB: Mar 23, 1941, 79 y.o.   MRN: [id number]  This visit was conducted in person.  BP 140/66 (BP Location: Left Arm, Patient Position: Sitting, Cuff Size: Normal)   Pulse 71   Temp 97.6 F (36.4 C) (Temporal)   Ht 5\' 6"  (1.676 m)   Wt 142 lb 7 oz (64.6 kg)   SpO2 98%   BMI 22.99 kg/m    CC: preop visit / TCM hosp f/u visit  Subjective:   HPI: [name] is a 79 y.o. male presenting on 12/02/2020 for Pre-op Exam (Has kidney stone surgery scheduled on 12/08/20. )   Recent hospitalization for R flank pain - found to have severe urosepsis with obstructive uropathy. Admitted to ICU/stepdown unit with CT renal stone protocol showing distal R ureteral obstructing 75mm stone with mild hydroureter as well as bilateral non-obstructing kidney stones. Treated with IV abx and levophed infusion/IVF for hypotension. S/p emergent R ureteral stent, planning ureteroscopy with laser lithotripsy and stent exchange next week [facility]). WBC 32 on admission, Cr 2.34. UCx and blcx x1 grew klebsiella pneumonia - treated with ceftriaxone - transitioned to renally dosed levaquin to complete 14d course (already completed).   No longer taking flomax or finasteride.   Requests preop evaluation today in preparation for planned ureteroscopy and laser lithotripsy. He thinks planned GETA.   Has previously tolerated GETA well (last cystoscopy 12/7, HOLEP prostate laser surgery 07/2020) but he finds trouble with anesthesia (auditory and visual hallucinations). No trouble waking up after surgery or postop nausea/vomiting.   Denies fevers/chills, HA, chest pain, tightness, dyspnea, dizziness, palpitations.  Latest CXR 11/21/2020 - ?worsening B interstitial edema/infiltrates and small pleural effusions. Will update CXR today.    Admit date: 11/18/2020 Discharge date: 11/22/2020 TCM hosp f/u phone call completed on 11/24/2020   Admitted From: Home Disposition:  Home  Recommendations for Outpatient Follow-up:  1. Follow up with PCP in 1 week 2. Please obtain BMP/CBC in one week 3. Urology in 1 week  Discharge Diagnoses:  Principal Problem:   Sepsis associated hypotension (HCC) Active Problems:   Symptomatic PVCs   BPH with urinary obstruction   Aorto-iliac atherosclerosis (HCC)   HLD (hyperlipidemia)   Fatigue   Anxiety   Sepsis (HCC)  Home Health: PT  Discharge Condition:Stable CODE STATUS: Full Diet recommendation: Heart Healthy     Relevant past medical, surgical, family and social history reviewed and updated as indicated. Interim medical history since our last visit reviewed. Allergies and medications reviewed and updated. Outpatient Medications Prior to Visit  Medication Sig Dispense Refill  . metoprolol succinate (TOPROL-XL) 25 MG 24 hr tablet TAKE 1/2 TABLET EVERY DAY (Patient taking differently: Take 12.5 mg by mouth daily with breakfast.) 45 tablet 3  . finasteride (PROSCAR) 5 MG tablet Take 1 tablet (5 mg total) by mouth daily. (Patient not taking: No sig reported) 90 tablet 3  . tamsulosin (FLOMAX) 0.4 MG CAPS capsule Take 1 capsule (0.4 mg total) by mouth daily. (Patient not taking: No sig reported) 90 capsule 3  . levofloxacin (LEVAQUIN) 250 MG tablet Take 1 tablet (250 mg total) by mouth daily for 9 days. 10 tablet 0   No facility-administered medications prior to visit.     Per HPI unless specifically indicated in ROS section below Review of Systems Objective:  BP 140/66 (BP Location: Left Arm, Patient Position: Sitting, Cuff Size: Normal)   Pulse 71   Temp 97.6 F (36.4 C) (Temporal)  Ht 5\' 6"  (1.676 m)   Wt 142 lb 7 oz (64.6 kg)   SpO2 98%   BMI 22.99 kg/m   Wt Readings from Last 3 Encounters:  12/02/20 142 lb 7 oz (64.6 kg)  11/18/20 125 lb (56.7 kg)  11/18/20 148 lb (67.1 kg)      Physical Exam Vitals and nursing note reviewed.  Constitutional:      Appearance: Normal appearance. He is not  ill-appearing.  Cardiovascular:     Rate and Rhythm: Normal rate and regular rhythm. Occasional extrasystoles are present.    Pulses: Normal pulses.     Heart sounds: Normal heart sounds. No murmur heard.   Pulmonary:     Effort: Pulmonary effort is normal. No respiratory distress.     Breath sounds: Normal breath sounds. No wheezing, rhonchi or rales.  Musculoskeletal:     Right lower leg: No edema.     Left lower leg: No edema.  Neurological:     Mental Status: He is alert.  Psychiatric:        Mood and Affect: Mood normal.        Behavior: Behavior normal.       EKG - NSR rate 60s, normal axis, intervals, no acute ST/T changes, occasional PVCs.  Assessment & Plan:  This visit occurred during the SARS-CoV-2 public health emergency.  Safety protocols were in place, including screening questions prior to the visit, additional usage of staff PPE, and extensive cleaning of exam room while observing appropriate contact time as indicated for disinfecting solutions.   Problem List Items Addressed This Visit    Symptomatic PVCs    Longstanding h/o this, managed with toprol XL 12.5mg  daily.       Recurrent kidney stones    Recent 18mm stone causing obstructive uropathy and urosepsis. Recent stent placement, completed abx treatment. Upcoming ureteroscopy and stent exchange.       Pre-op evaluation - Primary    Upcoming urological procedure. Check labs, EKG, CXR.  Anticipate adequately low risk to proceed with planned procedure.  He is very sensitive to narcotics and anesthetics.       Relevant Orders   EKG 12-Lead (Completed)   DG Chest 2 View   CBC with Differential/Platelet   Comprehensive metabolic panel   Protime-INR   Hypertension    Stable period on low dose Toprol XL      BPH with urinary obstruction    He's not taking finasteride or flomax. Recommend restart flomax daily.       Acute unilateral obstructive uropathy    Resolving with stent placement.  Update  labs.       Acute kidney injury ([place])    Due to obstructive uropathy, had improved upcoming discharge from hospital - update Cr today.           No orders of the defined types were placed in this encounter.  Orders Placed This Encounter  Procedures  . DG Chest 2 View    Standing Status:   Future    Number of Occurrences:   1    Standing Expiration Date:   12/02/2021    Order Specific Question:   Reason for Exam (SYMPTOM  OR DIAGNOSIS REQUIRED)    Answer:   preop clearance    Order Specific Question:   Preferred imaging location?    Answer:   [facility]  . CBC with Differential/Platelet  . Comprehensive metabolic panel  . Protime-INR  . EKG 12-Lead  Patient Instructions  Labs today EKG today Xray today.  We will forward results to urology. Assuming all ok - I think you will do well with upcoming surgery.   Follow up plan: Return if symptoms worsen or fail to improve.  [name], MD

## 2020-12-02 NOTE — Assessment & Plan Note (Signed)
Longstanding h/o this, managed with toprol XL 12.5mg  daily.

## 2020-12-02 NOTE — Assessment & Plan Note (Addendum)
Due to obstructive uropathy, had improved upcoming discharge from hospital - update Cr today.

## 2020-12-02 NOTE — Assessment & Plan Note (Signed)
Recent 46mm stone causing obstructive uropathy and urosepsis. Recent stent placement, completed abx treatment. Upcoming ureteroscopy and stent exchange.

## 2020-12-02 NOTE — Assessment & Plan Note (Signed)
Stable period on low dose Toprol XL

## 2020-12-03 ENCOUNTER — Encounter
Admission: RE | Admit: 2020-12-03 | Discharge: 2020-12-03 | Disposition: A | Discharge status: Home / Self Care | Payer: Medicare HMO | Source: Ambulatory Visit | Attending: Urology | Admitting: Urology

## 2020-12-03 HISTORY — DX: Other complications of anesthesia, initial encounter: T88.59XA

## 2020-12-03 NOTE — Patient Instructions (Signed)
Your procedure is scheduled on:12-09-20 TUESDAY Report to the Registration Desk on the 1st floor of the [facility]. To find out your arrival time, please call [phone] between 1PM - 3PM on:12-08-20 MONDAY  REMEMBER: Instructions that are not followed completely may result in serious medical risk, up to and including death; or upon the discretion of your surgeon and anesthesiologist your surgery may need to be rescheduled.  Do not eat food after midnight the night before surgery.  No gum chewing, lozengers or hard candies.  You may however, drink CLEAR liquids up to 2 hours before you are scheduled to arrive for your surgery. Do not drink anything within 2 hours of your scheduled arrival time.  Clear liquids include: - water  - apple juice without pulp - gatorade  - black coffee or tea (Do NOT add milk or creamers to the coffee or tea) Do NOT drink anything that is not on this list.  TAKE THESE MEDICATIONS THE MORNING OF SURGERY WITH A SIP OF WATER: -METOPROLOL (TOPROL)  One week prior to surgery: Stop Anti-inflammatories (NSAIDS) such as Advil, Aleve, Ibuprofen, Motrin, Naproxen, Naprosyn and Aspirin based products such as Excedrin, Goodys Powder, BC Powder-OK TO TAKE TYLENOL IF NEEDED  Stop ANY OVER THE COUNTER supplements until after surgery.  No Alcohol for 24 hours before or after surgery.  No Smoking including e-cigarettes for 24 hours prior to surgery.  No chewable tobacco products for at least 6 hours prior to surgery.  No nicotine patches on the day of surgery.  Do not use any "recreational" drugs for at least a week prior to your surgery.  Please be advised that the combination of cocaine and anesthesia may have negative outcomes, up to and including death. If you test positive for cocaine, your surgery will be cancelled.  On the morning of surgery brush your teeth with toothpaste and water, you may rinse your mouth with mouthwash if you wish. Do not swallow any  toothpaste or mouthwash.  Do not wear jewelry, make-up, hairpins, clips or nail polish.  Do not wear lotions, powders, or perfumes.   Do not shave body from the neck down 48 hours prior to surgery just in case you cut yourself which could leave a site for infection.  Also, freshly shaved skin may become irritated if using the CHG soap.  Contact lenses, hearing aids and dentures may not be worn into surgery.  Do not bring valuables to the hospital. [facility] is not responsible for any missing/lost belongings or valuables.   Notify your doctor if there is any change in your medical condition (cold, fever, infection).  Wear comfortable clothing (specific to your surgery type) to the hospital.  Plan for stool softeners for home use; pain medications have a tendency to cause constipation. You can also help prevent constipation by eating foods high in fiber such as fruits and vegetables and drinking plenty of fluids as your diet allows.  After surgery, you can help prevent lung complications by doing breathing exercises.  Take deep breaths and cough every 1-2 hours. Your doctor may order a device called an Incentive Spirometer to help you take deep breaths. When coughing or sneezing, hold a pillow firmly against your incision with both hands. This is called "splinting." Doing this helps protect your incision. It also decreases belly discomfort.  If you are being admitted to the hospital overnight, leave your suitcase in the car. After surgery it may be brought to your room.  If you are  being discharged the day of surgery, you will not be allowed to drive home. You will need a responsible adult (18 years or older) to drive you home and stay with you that night.   If you are taking public transportation, you will need to have a responsible adult (18 years or older) with you. Please confirm with your physician that it is acceptable to use public transportation.   Please call the  [facility] at [phone] if you have any questions about these instructions.  Visitation Policy:  Patients undergoing a surgery or procedure may have one family member or support person with them as long as that person is not COVID-19 positive or experiencing its symptoms.  That person may remain in the waiting area during the procedure.  Inpatient Visitation Update:   In an effort to ensure the safety of our team members and our patients, we are implementing a change to our visitation policy:  Effective Monday, Aug. 9, at 7 a.m., inpatients will be allowed one support person.  o The support person may change daily.  o The support person must pass our screening, gel in and out, and wear a mask at all times, including in the patient's room.  o Patients must also wear a mask when staff or their support person are in the room.  o Masking is required regardless of vaccination status.  Systemwide, no visitors 17 or younger.

## 2020-12-05 ENCOUNTER — Other Ambulatory Visit: Payer: Medicare HMO

## 2020-12-07 LAB — CULTURE, URINE COMPREHENSIVE

## 2020-12-08 ENCOUNTER — Other Ambulatory Visit: Payer: Self-pay

## 2020-12-08 ENCOUNTER — Other Ambulatory Visit
Admission: RE | Admit: 2020-12-08 | Discharge: 2020-12-08 | Disposition: A | Discharge status: Home / Self Care | Payer: Medicare HMO | Source: Ambulatory Visit | Attending: Urology | Admitting: Urology

## 2020-12-08 DIAGNOSIS — Z20822 Contact with and (suspected) exposure to covid-19: Secondary | ICD-10-CM | POA: Insufficient documentation

## 2020-12-08 DIAGNOSIS — Z01812 Encounter for preprocedural laboratory examination: Secondary | ICD-10-CM | POA: Diagnosis not present

## 2020-12-09 ENCOUNTER — Ambulatory Visit: Payer: Medicare HMO | Admitting: Anesthesiology

## 2020-12-09 ENCOUNTER — Ambulatory Visit: Payer: Medicare HMO

## 2020-12-09 ENCOUNTER — Encounter
Admission: RE | Disposition: A | Discharge status: Home / Self Care | Payer: Self-pay | Source: Home / Self Care | Attending: Urology

## 2020-12-09 ENCOUNTER — Encounter: Payer: Self-pay | Admitting: Urology

## 2020-12-09 ENCOUNTER — Ambulatory Visit
Admission: RE | Admit: 2020-12-09 | Discharge: 2020-12-09 | Disposition: A | Discharge status: Home / Self Care | Payer: Medicare HMO | Attending: Urology | Admitting: Urology

## 2020-12-09 DIAGNOSIS — N202 Calculus of kidney with calculus of ureter: Secondary | ICD-10-CM | POA: Insufficient documentation

## 2020-12-09 DIAGNOSIS — N200 Calculus of kidney: Secondary | ICD-10-CM

## 2020-12-09 DIAGNOSIS — N201 Calculus of ureter: Secondary | ICD-10-CM | POA: Diagnosis not present

## 2020-12-09 LAB — SARS CORONAVIRUS 2 (TAT 6-24 HRS): SARS Coronavirus 2: NEGATIVE

## 2020-12-09 SURGERY — CYSTOSCOPY/URETEROSCOPY/HOLMIUM LASER/STENT PLACEMENT
Anesthesia: General | Site: Ureter | Laterality: Right

## 2020-12-09 MED ORDER — FAMOTIDINE 20 MG PO TABS: 20.0000 mg | ORAL_TABLET | Freq: Once | ORAL | Status: AC

## 2020-12-09 MED ORDER — LEVOFLOXACIN 500 MG PO TABS: ORAL_TABLET | ORAL | 0 refills | Status: DC

## 2020-12-09 MED ORDER — LIDOCAINE HCL (CARDIAC) PF 100 MG/5ML IV SOSY
PREFILLED_SYRINGE | INTRAVENOUS | Status: DC | PRN
  Administered 2020-12-09: 60 mg via INTRAVENOUS

## 2020-12-09 MED ORDER — MIDAZOLAM HCL 2 MG/2ML IJ SOLN
INTRAMUSCULAR | Status: AC
  Filled 2020-12-09: qty 2

## 2020-12-09 MED ORDER — EPHEDRINE SULFATE 50 MG/ML IJ SOLN
INTRAMUSCULAR | Status: DC | PRN
  Administered 2020-12-09: 5 mg via INTRAVENOUS

## 2020-12-09 MED ORDER — PROPOFOL 10 MG/ML IV BOLUS
INTRAVENOUS | Status: AC
  Filled 2020-12-09: qty 20

## 2020-12-09 MED ORDER — CEFAZOLIN SODIUM-DEXTROSE 2-4 GM/100ML-% IV SOLN
2.0000 g | INTRAVENOUS | Status: AC
  Administered 2020-12-09: 2 g via INTRAVENOUS

## 2020-12-09 MED ORDER — PROPOFOL 10 MG/ML IV BOLUS
INTRAVENOUS | Status: DC | PRN
  Administered 2020-12-09: 120 mg via INTRAVENOUS

## 2020-12-09 MED ORDER — LACTATED RINGERS IV SOLN: INTRAVENOUS | Status: DC

## 2020-12-09 MED ORDER — LIDOCAINE HCL (PF) 2 % IJ SOLN
INTRAMUSCULAR | Status: AC
  Filled 2020-12-09: qty 5

## 2020-12-09 MED ORDER — EPHEDRINE 5 MG/ML INJ
INTRAVENOUS | Status: AC
  Filled 2020-12-09: qty 10

## 2020-12-09 MED ORDER — PHENYLEPHRINE HCL (PRESSORS) 10 MG/ML IV SOLN
INTRAVENOUS | Status: DC | PRN
  Administered 2020-12-09: 50 ug via INTRAVENOUS

## 2020-12-09 MED ORDER — ONDANSETRON HCL 4 MG/2ML IJ SOLN: 4.0000 mg | Freq: Once | INTRAMUSCULAR | Status: DC | PRN

## 2020-12-09 MED ORDER — ORAL CARE MOUTH RINSE: 15.0000 mL | Freq: Once | OROMUCOSAL | Status: AC

## 2020-12-09 MED ORDER — FENTANYL CITRATE (PF) 100 MCG/2ML IJ SOLN
INTRAMUSCULAR | Status: DC | PRN
  Administered 2020-12-09: 25 ug via INTRAVENOUS

## 2020-12-09 MED ORDER — IOPAMIDOL (ISOVUE-200) INJECTION 41%
INTRAVENOUS | Status: DC | PRN
  Administered 2020-12-09: 15 mL via INTRAVENOUS

## 2020-12-09 MED ORDER — CHLORHEXIDINE GLUCONATE 0.12 % MT SOLN
OROMUCOSAL | Status: AC
  Administered 2020-12-09: 15 mL via OROMUCOSAL
  Filled 2020-12-09: qty 15

## 2020-12-09 MED ORDER — FENTANYL CITRATE (PF) 100 MCG/2ML IJ SOLN
25.0000 ug | INTRAMUSCULAR | Status: DC | PRN
Start: 2020-12-09 — End: 2020-12-09

## 2020-12-09 MED ORDER — FENTANYL CITRATE (PF) 100 MCG/2ML IJ SOLN
INTRAMUSCULAR | Status: AC
  Filled 2020-12-09: qty 2

## 2020-12-09 MED ORDER — CEFAZOLIN SODIUM-DEXTROSE 2-4 GM/100ML-% IV SOLN
INTRAVENOUS | Status: AC
  Filled 2020-12-09: qty 100

## 2020-12-09 MED ORDER — CHLORHEXIDINE GLUCONATE 0.12 % MT SOLN: 15.0000 mL | Freq: Once | OROMUCOSAL | Status: AC

## 2020-12-09 MED ORDER — FAMOTIDINE 20 MG PO TABS
ORAL_TABLET | ORAL | Status: AC
  Administered 2020-12-09: 20 mg via ORAL
  Filled 2020-12-09: qty 1

## 2020-12-09 SURGICAL SUPPLY — 30 items
BAG DRAIN CYSTO-URO LG1000N (MISCELLANEOUS) ×2 IMPLANT
BASKET ZERO TIP 1.9FR (BASKET) ×2 IMPLANT
BRUSH SCRUB EZ 1% IODOPHOR (MISCELLANEOUS) ×2 IMPLANT
CATH URETL 5X70 OPEN END (CATHETERS) IMPLANT
CNTNR SPEC 2.5X3XGRAD LEK (MISCELLANEOUS) ×1
CONT SPEC 4OZ STER OR WHT (MISCELLANEOUS) ×1
CONTAINER SPEC 2.5X3XGRAD LEK (MISCELLANEOUS) ×1 IMPLANT
DRAPE UTILITY 15X26 TOWEL STRL (DRAPES) ×2 IMPLANT
GLOVE BIOGEL PI IND STRL 7.5 (GLOVE) ×1 IMPLANT
GLOVE BIOGEL PI INDICATOR 7.5 (GLOVE) ×1
GOWN STRL REUS W/ TWL LRG LVL3 (GOWN DISPOSABLE) ×1 IMPLANT
GOWN STRL REUS W/ TWL XL LVL3 (GOWN DISPOSABLE) ×1 IMPLANT
GOWN STRL REUS W/TWL LRG LVL3 (GOWN DISPOSABLE) ×1
GOWN STRL REUS W/TWL XL LVL3 (GOWN DISPOSABLE) ×2
GUIDEWIRE STR DUAL SENSOR (WIRE) ×2 IMPLANT
INFUSOR MANOMETER BAG 3000ML (MISCELLANEOUS) ×2 IMPLANT
INTRODUCER DILATOR DOUBLE (INTRODUCER) IMPLANT
KIT TURNOVER CYSTO (KITS) ×2 IMPLANT
MANIFOLD NEPTUNE II (INSTRUMENTS) ×2 IMPLANT
PACK CYSTO AR (MISCELLANEOUS) ×2 IMPLANT
SET CYSTO W/LG BORE CLAMP LF (SET/KITS/TRAYS/PACK) ×2 IMPLANT
SHEATH URETERAL 12FRX35CM (MISCELLANEOUS) IMPLANT
SOL .9 NS 3000ML IRR  AL (IV SOLUTION) ×1
SOL .9 NS 3000ML IRR UROMATIC (IV SOLUTION) ×1 IMPLANT
STENT URET 6FRX24 CONTOUR (STENTS) ×2 IMPLANT
STENT URET 6FRX26 CONTOUR (STENTS) IMPLANT
SURGILUBE 2OZ TUBE FLIPTOP (MISCELLANEOUS) ×2 IMPLANT
TRACTIP FLEXIVA PULSE ID 200 (Laser) ×2 IMPLANT
VALVE UROSEAL ADJ ENDO (VALVE) ×2 IMPLANT
WATER STERILE IRR 1000ML POUR (IV SOLUTION) ×2 IMPLANT

## 2020-12-09 NOTE — Anesthesia Procedure Notes (Signed)
Procedure Name: LMA Insertion Date/Time: 12/09/2020 1:38 PM Performed by: Milagros Reap, CRNA Pre-anesthesia Checklist: Patient identified, Emergency Drugs available, Suction available, Patient being monitored and Timeout performed Patient Re-evaluated:Patient Re-evaluated prior to induction Oxygen Delivery Method: Circle system utilized Preoxygenation: Pre-oxygenation with 100% oxygen Induction Type: IV induction Ventilation: Mask ventilation without difficulty LMA: LMA inserted LMA Size: 3.0 Number of attempts: 1 Placement Confirmation: positive ETCO2 and breath sounds checked- equal and bilateral Tube secured with: Tape Dental Injury: Teeth and Oropharynx as per pre-operative assessment

## 2020-12-09 NOTE — Discharge Instructions (Signed)

## 2020-12-09 NOTE — Interval H&P Note (Signed)
History and Physical Interval Note: Patient presents for definitive stone treatment.  The procedure was discussed in detail including potential risks of bleeding, infection, ureteral injury as well as anesthetic risks.  All questions were answered and he desires to proceed  12/09/2020 1:13 PM  [name]  has presented today for surgery, with the diagnosis of right ureteral calculus.  The various methods of treatment have been discussed with the patient and family. After consideration of risks, benefits and other options for treatment, the patient has consented to  Procedure(s): CYSTOSCOPY/URETEROSCOPY/HOLMIUM LASER/STENT Exchange (Right) as a surgical intervention.  The patient's history has been reviewed, patient examined, no change in status, stable for surgery.  I have reviewed the patient's chart and labs.  Questions were answered to the patient's satisfaction.     Kynnedi Zweig C Tonye Tancredi

## 2020-12-09 NOTE — Anesthesia Preprocedure Evaluation (Signed)
Anesthesia Evaluation  Patient identified by MRN, date of birth, ID band Patient awake    Reviewed: Allergy & Precautions, H&P , NPO status , Patient's Chart, lab work & pertinent test results, reviewed documented beta blocker date and time   History of Anesthesia Complications (+) PONV and history of anesthetic complications  Airway Mallampati: II  TM Distance: >3 FB Neck ROM: full    Dental  (+) Teeth Intact   Pulmonary sleep apnea ,    Pulmonary exam normal        Cardiovascular Exercise Tolerance: Good hypertension, On Medications Normal cardiovascular exam Rhythm:regular Rate:Normal     Neuro/Psych PSYCHIATRIC DISORDERS Anxiety Depression negative neurological ROS     GI/Hepatic negative GI ROS, Neg liver ROS,   Endo/Other  negative endocrine ROS  Renal/GU Renal disease  negative genitourinary   Musculoskeletal   Abdominal   Peds  Hematology  (+) Blood dyscrasia, anemia ,   Anesthesia Other Findings Past Medical History: No date: Actinic keratosis No date: BPH with urinary obstruction     Comment:  sees urology Dr. Apolinar Junes 07/20/2016: Carotid stenosis     Comment:  Minimal on [place] 07/2016. Monitor clinically. No date: Complication of anesthesia     Comment:  HALLUCINATED AFTER SURGERY No date: Depression with anxiety 2014: Elevated PSA     Comment:  sees urology - normal biopsies x 3 No date: History of kidney stones No date: HLD (hyperlipidemia) 09/2014: Left kidney mass     Comment:  on CT scan thought consistent with simple cyst on prior               [place] No date: Nasal obstruction     Comment:  congenital R sided 09/2014: Obstructive pyelonephritis     Comment:  s/p hospitalization and treatment with levaquin No date: OSA (obstructive sleep apnea)     Comment:  pt denies 06/04/2018: Osteoporosis     Comment:  DEXA T score -37 at L spine 2014: Recurrent kidney stones     Comment:  extensive  bilateral nephrolithiasis, sees urology               Marlou Porch), hydrocodone prn 07/25/2019: Squamous cell carcinoma of skin     Comment:  crown scalp 06/29/2017: Squamous cell carcinoma of skin     Comment:  SCC IS ant to crown post No date: Symptomatic PVCs     Comment:  per prior cardiologist, on beta blocker No date: Urge incontinence     Comment:  thought overactive bladder Past Surgical History: 03/2012: CARDIAC CATHETERIZATION     Comment:  EF 45-50%, no LVH, + impaired LV relaxation, no PH, no               valve abnormalities 03/2012: CARDIOVASCULAR STRESS TEST     Comment:  mild peri infarct ischemia in apical segment, decreased               EF 01/2013: CATARACT EXTRACTION; Right 02/2008: COLONOSCOPY     Comment:  mod diverticulosis o/w WNL (2009) No date: CYSTOSCOPY 10/2014: CYSTOSCOPY     Comment:  s/p stone removal, with stent removal Edwyna Shell) 11/18/2020: CYSTOSCOPY WITH STENT PLACEMENT; Right     Comment:  Procedure: CYSTOSCOPY WITH STENT PLACEMENT;  Surgeon:               Riki Altes, MD;  Location: [facility] ORS;  Service:               Urology;  Laterality:  Right; 08/11/2020: CYSTOSCOPY/URETEROSCOPY/HOLMIUM LASER/STENT PLACEMENT; Left     Comment:  Procedure: CYSTOSCOPY/URETEROSCOPY/STENT PLACEMENT;                Surgeon: Hollice Espy, MD;  Location: [facility] ORS;                Service: Urology;  Laterality: Left; 08/28/2020: CYSTOSCOPY/URETEROSCOPY/HOLMIUM LASER/STENT PLACEMENT; N/A     Comment:  Procedure: CYSTOSCOPY/URETEROSCOPY/HOLMIUM LASER/STENT               exchange;  Surgeon: Hollice Espy, MD;  Location: [facility]               ORS;  Service: Urology;  Laterality: N/A; 08/11/2020: HOLEP-LASER ENUCLEATION OF THE PROSTATE WITH MORCELLATION;  N/A     Comment:  Procedure: HOLEP-LASER ENUCLEATION OF THE PROSTATE WITH               MORCELLATION;  Surgeon: Hollice Espy, MD;  Location:               [facility] ORS;  Service: Urology;  Laterality: N/A; No date:  TONSILECTOMY, ADENOIDECTOMY, BILATERAL MYRINGOTOMY AND TUBES     Comment:  removed as a child 03/2012: US ECHOCARDIOGRAPHY     Comment:  hypokinetic anterior wall, EF 45-50%, impaired               relaxation pattern, mild LA dilation, mild-mod MR,               mild-mod AR BMI    Body Mass Index: 22.99 kg/m     Reproductive/Obstetrics negative OB ROS                             Anesthesia Physical Anesthesia Plan  ASA: II  Anesthesia Plan: General ETT   Post-op Pain Management:    Induction:   PONV Risk Score and Plan:   Airway Management Planned:   Additional Equipment:   Intra-op Plan:   Post-operative Plan:   Informed Consent: I have reviewed the patients History and Physical, chart, labs and discussed the procedure including the risks, benefits and alternatives for the proposed anesthesia with the patient or authorized representative who has indicated his/her understanding and acceptance.     Dental Advisory Given  Plan Discussed with: CRNA  Anesthesia Plan Comments:         Anesthesia Quick Evaluation

## 2020-12-09 NOTE — Op Note (Signed)
Preoperative diagnosis:  1.  Right distal ureteral calculus 2.  Right nephrolithiasis  Postoperative diagnosis: 1.  Same  Procedure:  1. Cystoscopy 2. Right ureteroscopy and stone removal 3. Ureteroscopic laser lithotripsy 4. Right ureteral stent exchange (6FR/24 cm) 5. Right retrograde pyelography with interpretation  Surgeon: Lorin Picket C. Aneesh Faller, M.D.  Anesthesia: General  Complications: None  Intraoperative findings:  1.  Cystoscopy-wide caliber bulbar urethral stricture; open prostatic fossa s/p HoLEP; inflammatory changes right ureteral orifice secondary to indwelling stent otherwise no mucosal abnormalities including solid or papillary lesions 2.  Ureteroscopy-no ureteral mucosal abnormalities noted including stricture or lesion ~6 mm distal ureteral calculus 3.  Pyeloscopy-multiple Randall's plaques; 2 lower calyceal calculi measuring ~4 mm 4.  Right retrograde pyelography post procedure showed no filling defects, stone fragments or contrast extravasation  EBL: Minimal  Specimens: 1. Calculus fragments for analysis   Indication: [name] is a 79 y.o. male status post urgent right ureteral stent placement 11/18/2020 for an obstructing right ureteral calculus with sepsis.  CT also showed nonobstructing renal calculi.  He presents today for definitive stone treatment.  Preoperative urine culture was negative.  After reviewing the management options for treatment, the patient elected to proceed with the above surgical procedure(s). We have discussed the potential benefits and risks of the procedure, side effects of the proposed treatment, the likelihood of the patient achieving the goals of the procedure, and any potential problems that might occur during the procedure or recuperation. Informed consent has been obtained.  Description of procedure:  The patient was taken to the operating room and general anesthesia was induced.  The patient was placed in the dorsal  lithotomy position, prepped and draped in the usual sterile fashion, and preoperative antibiotics were administered. A preoperative time-out was performed.   A 21 French cystoscope was lubricated, passed under direct vision and advanced proximally into the bladder with findings as describe above.  The stent was grasped with endoscopic forceps and brought out to the urethral meatus.  A 0.038 Sensor wire was then advanced through the stent in the renal pelvis under fluoroscopic guidance and the stent was removed.  A 4.5 Fr semirigid ureteroscope was then advanced into the ureter next to the guidewire and the calculus was identified as described above.  The stone was then fragmented with a 200 micron holmium laser fiber on a setting of 0.8 J/8 Hz.  All fragments were then removed from the ureter with a zero tip nitinol basket and dropped in the bladder.  Reinspection of the ureter revealed no remaining visible stones or fragments.   A single channel digital flexible ureteroscope was then passed per urethra.  The ureteroscope was advanced into the ureter alongside the guidewire and advanced to the renal pelvis.    The lower calyceal calculus was placed in a 1.9 French nitinol basket and removed out difficulty.  The ureteroscope was repassed and the second lower calyceal calculus was placed in the basket and removed without difficulty.  Retrograde pyelogram was then performed with findings as described above.  All calyces were examined under fluoroscopic guidance and no additional calculi were identified.  A 6 FR/24 CM Contour ureteral stent was placed under fluoroscopic guidance.  The wire was then removed with an adequate stent curl noted in the renal pelvis as well as in the bladder.  The cystoscope was repassed and the calculi were removed via irrigation.  The bladder was then emptied and the procedure ended.  The patient appeared to tolerate the  procedure well and without complications.  After  anesthetic reversal the patient was transported to the PACU in stable condition.   Plan:  Office follow-up 7-10 days for stent removal   John Giovanni, MD

## 2020-12-09 NOTE — Transfer of Care (Signed)
Immediate Anesthesia Transfer of Care Note  Patient: [name]  Procedure(s) Performed: CYSTOSCOPY/URETEROSCOPY/HOLMIUM LASER/STENT Exchange (Right Ureter)  Patient Location: PACU  Anesthesia Type:General  Level of Consciousness: awake, alert  and oriented  Airway & Oxygen Therapy: Patient Spontanous Breathing and Patient connected to face mask oxygen  Post-op Assessment: Report given to RN and Post -op Vital signs reviewed and stable  Post vital signs: Reviewed and stable  Last Vitals:  Vitals Value Taken Time  BP 153/71 12/09/20 1441  Temp    Pulse 63 12/09/20 1441  Resp 14 12/09/20 1441  SpO2 94 % 12/09/20 1441  Vitals shown include unvalidated device data.  Last Pain:  Vitals:   12/09/20 1157  TempSrc: Oral  PainSc: 0-No pain         Complications: No complications documented.

## 2020-12-09 NOTE — Progress Notes (Signed)
Tolerating po fluids. VSS.

## 2020-12-10 ENCOUNTER — Encounter: Payer: Self-pay | Admitting: Urology

## 2020-12-10 NOTE — Anesthesia Postprocedure Evaluation (Signed)
Anesthesia Post Note  Patient: [name]  Procedure(s) Performed: CYSTOSCOPY/URETEROSCOPY/HOLMIUM LASER/STENT Exchange (Right Ureter)  Patient location during evaluation: PACU Anesthesia Type: General Level of consciousness: awake and alert Pain management: pain level controlled Vital Signs Assessment: post-procedure vital signs reviewed and stable Respiratory status: spontaneous breathing, nonlabored ventilation, respiratory function stable and patient connected to nasal cannula oxygen Cardiovascular status: stable and blood pressure returned to baseline Postop Assessment: no apparent nausea or vomiting Anesthetic complications: no   No complications documented.   Last Vitals:  Vitals:   12/09/20 1512 12/09/20 1523  BP: (!) 156/72 (!) 161/64  Pulse: 64 60  Resp: (!) 26 16  Temp:  (!) 36.3 C  SpO2: 99% 100%    Last Pain:  Vitals:   12/09/20 1523  TempSrc: Temporal  PainSc: 0-No pain                 Yevette Edwards

## 2020-12-16 LAB — CALCULI, WITH PHOTOGRAPH (CLINICAL LAB)
Calcium Oxalate Dihydrate: 30 %
Calcium Oxalate Monohydrate: 70 %
Weight Calculi: 62 mg

## 2020-12-17 ENCOUNTER — Encounter: Payer: Medicare HMO | Admitting: Urology

## 2020-12-18 ENCOUNTER — Encounter: Payer: Self-pay | Admitting: Urology

## 2020-12-18 ENCOUNTER — Encounter: Payer: Self-pay | Admitting: *Deleted

## 2020-12-18 ENCOUNTER — Other Ambulatory Visit: Payer: Self-pay

## 2020-12-18 ENCOUNTER — Ambulatory Visit: Payer: Medicare HMO | Admitting: Urology

## 2020-12-18 ENCOUNTER — Encounter: Payer: Self-pay | Admitting: Family Medicine

## 2020-12-18 VITALS — BP 130/72 | HR 74 | Ht 64.0 in | Wt 145.0 lb

## 2020-12-18 DIAGNOSIS — N201 Calculus of ureter: Secondary | ICD-10-CM

## 2020-12-18 DIAGNOSIS — D1600 Benign neoplasm of scapula and long bones of unspecified upper limb: Secondary | ICD-10-CM | POA: Insufficient documentation

## 2020-12-18 MED ORDER — LEVOFLOXACIN 500 MG PO TABS
500.0000 mg | ORAL_TABLET | Freq: Once | ORAL | Status: AC
  Administered 2020-12-18: 500 mg via ORAL

## 2020-12-18 NOTE — Patient Instructions (Signed)

## 2020-12-19 LAB — URINALYSIS, COMPLETE
Bilirubin, UA: NEGATIVE
Glucose, UA: NEGATIVE
Nitrite, UA: NEGATIVE
Specific Gravity, UA: 1.02 (ref 1.005–1.030)
Urobilinogen, Ur: 1 mg/dL (ref 0.2–1.0)
pH, UA: 5.5 (ref 5.0–7.5)

## 2020-12-19 LAB — MICROSCOPIC EXAMINATION: RBC, Urine: >30 /hpf — AB (ref 0–2)

## 2020-12-21 ENCOUNTER — Encounter: Payer: Self-pay | Admitting: Urology

## 2020-12-21 NOTE — Progress Notes (Signed)
Indications: Patient is 80 y.o., who is s/p ureteroscopic removal of a right distal and right renal calculi on 12/09/2020.  He had no postoperative problems the patient is presenting today for stent removal.  Procedure:  Flexible Cystoscopy with stent removal (84536)  Timeout was performed and the correct patient, procedure and participants were identified.    Description:  The patient was prepped and draped in the usual sterile fashion. Flexible cystosopy was performed.  The stent was visualized, grasped, and removed intact without difficulty. The patient tolerated the procedure well.  A single dose of oral antibiotics was given.  Complications:  None  Plan:   Stone analysis CaOxMono/CaOxDi 70/30  Call for fever flank pain post stent removal  Recurrent stone disease-recommended a metabolic evaluation  34-month follow-up with Larene Beach with [facility]   John Giovanni, MD

## 2020-12-24 ENCOUNTER — Other Ambulatory Visit: Payer: Self-pay | Admitting: Family Medicine

## 2020-12-24 NOTE — ED Provider Notes (Signed)
EKG from 12 7 read interpreted by me shows what appears to be normal sinus rhythm at 89 normal axis there is part of the EKG that is in bigeminy.  There is slight but not significant ST elevation in leads two 3-1/2.  The baseline is irregular and it makes it difficult to tell if this is sinus rhythm or not but the complexes are regular.   Nena Polio, MD 12/24/20 2043

## 2021-02-10 ENCOUNTER — Encounter: Payer: Self-pay | Admitting: Dermatology

## 2021-02-10 ENCOUNTER — Ambulatory Visit: Payer: Medicare HMO | Admitting: Dermatology

## 2021-02-10 ENCOUNTER — Other Ambulatory Visit: Payer: Self-pay

## 2021-02-10 DIAGNOSIS — L603 Nail dystrophy: Secondary | ICD-10-CM | POA: Diagnosis not present

## 2021-02-10 MED ORDER — MUPIROCIN 2 % EX OINT: 1.0000 "application " | TOPICAL_OINTMENT | Freq: Every day | CUTANEOUS | 0 refills | Status: DC

## 2021-02-10 NOTE — Patient Instructions (Signed)

## 2021-02-10 NOTE — Progress Notes (Addendum)
   Follow-Up Visit   Subjective  [name] is a 80 y.o. male who presents for the following: Other (Nail dystrophy of right 2nd toenail - Remove nail today).  The following portions of the chart were reviewed this encounter and updated as appropriate:   Tobacco  Allergies  Meds  Problems  Med Hx  Surg Hx  Fam Hx     Review of Systems:  No other skin or systemic complaints except as noted in HPI or Assessment and Plan.  Objective  Well appearing patient in no apparent distress; mood and affect are within normal limits.  A focused examination was performed including right foot. Relevant physical exam findings are noted in the Assessment and Plan.  Objective  Right 2nd toenail: Nail dystrophy   Assessment & Plan  Nail dystrophy -with symptoms and persistence of thickening despite aggressive treatment Right 2nd toenail Digital block of right 2nd toe using 3 cc of 1% lidocaine. Numbing around toenail using 3 cc of 1% lidocaine with epinephrine.  Nail removal followed by nail matrixectomy and hemostasis with electrocautery. Wound care discussed. Mupirocin daily.  mupirocin ointment (BACTROBAN) 2 % - Right 2nd toenail  Return in about 1 week (around 02/17/2021).  I, Ashok Cordia, CMA, am acting as scribe for [name], MD .  Documentation: I have reviewed the above documentation for accuracy and completeness, and I agree with the above.  [name], MD

## 2021-02-17 ENCOUNTER — Encounter: Payer: Self-pay | Admitting: Dermatology

## 2021-02-17 ENCOUNTER — Ambulatory Visit (INDEPENDENT_AMBULATORY_CARE_PROVIDER_SITE_OTHER): Payer: Medicare HMO | Admitting: Dermatology

## 2021-02-17 ENCOUNTER — Other Ambulatory Visit: Payer: Self-pay

## 2021-02-17 DIAGNOSIS — Z4801 Encounter for change or removal of surgical wound dressing: Secondary | ICD-10-CM

## 2021-02-17 NOTE — Progress Notes (Signed)
   Follow-Up Visit   Subjective  [name] is a 80 y.o. male who presents for the following: post op (Patient is here today to recheck the toenail removal site of the R 2nd toe ).  The following portions of the chart were reviewed this encounter and updated as appropriate:   Tobacco  Allergies  Meds  Problems  Med Hx  Surg Hx  Fam Hx     Review of Systems:  No other skin or systemic complaints except as noted in HPI or Assessment and Plan.  Objective  Well appearing patient in no apparent distress; mood and affect are within normal limits.  A focused examination was performed including the right foot and toes. Relevant physical exam findings are noted in the Assessment and Plan.  Objective  R 2nd toe: Healing wound.   Assessment & Plan  Encounter for replacement of dressing of surgical wound Post-op nail avulsion with matixectomy R 2nd toe  Healing well, doesn't appear to be infected at this time. Continue Mupirocin 2% ointment to aa QD until completely healed. Recheck at follow up appointment in 4 months.   Return in about 4 months (around 06/19/2021) for toenail recheck .  Luther Redo, CMA, am acting as scribe for [name], MD .  Documentation: I have reviewed the above documentation for accuracy and completeness, and I agree with the above.  [name], MD

## 2021-03-17 ENCOUNTER — Ambulatory Visit
Admission: RE | Admit: 2021-03-17 | Discharge: 2021-03-17 | Disposition: A | Discharge status: Home / Self Care | Payer: Medicare HMO | Attending: Urology | Admitting: Urology

## 2021-03-17 ENCOUNTER — Other Ambulatory Visit: Payer: Self-pay

## 2021-03-17 ENCOUNTER — Ambulatory Visit: Payer: Medicare HMO | Admitting: Urology

## 2021-03-17 ENCOUNTER — Ambulatory Visit
Admission: RE | Admit: 2021-03-17 | Discharge: 2021-03-17 | Disposition: A | Discharge status: Home / Self Care | Payer: Medicare HMO | Source: Ambulatory Visit | Attending: Urology | Admitting: Urology

## 2021-03-17 ENCOUNTER — Encounter: Payer: Self-pay | Admitting: Urology

## 2021-03-17 VITALS — BP 142/78 | HR 76 | Ht 64.0 in | Wt 145.0 lb

## 2021-03-17 DIAGNOSIS — N401 Enlarged prostate with lower urinary tract symptoms: Secondary | ICD-10-CM

## 2021-03-17 DIAGNOSIS — N200 Calculus of kidney: Secondary | ICD-10-CM | POA: Insufficient documentation

## 2021-03-17 DIAGNOSIS — N138 Other obstructive and reflux uropathy: Secondary | ICD-10-CM

## 2021-03-17 DIAGNOSIS — Z466 Encounter for fitting and adjustment of urinary device: Secondary | ICD-10-CM | POA: Diagnosis not present

## 2021-03-17 LAB — BLADDER SCAN AMB NON-IMAGING: Scan Result: 15

## 2021-03-17 NOTE — Progress Notes (Signed)
03/18/2021 1:32 PM   [name] Apr 01, 1941 [id number]  Referring provider: [name], MD [place],  [place] [address]  CC: Follow up  Urological history: 1. Nephrolithiasis -s/p right URS 11/2020 -stone composition CaOxMono/CaOxDi 70/30  2. BPH with LU TS -I PSS 0/0 -PVR 15 mL -s/p HoLEP on 08/11/2020-pathology noted benign prostatic tissue with nodular hyperplasia. Negative for dysplasia and malignancy   HPI: [name] is a 80 y.o. male who presents today for 3 month follow up.  He states he is feeling well.  He has not experiencing any renal colic.  He denied any episodes of gross hematuria or passage of fragments since we last saw him.   He is urinating without complaint.  Patient denies any modifying or aggravating factors.  Patient denies any gross hematuria, dysuria or suprapubic/flank pain.  Patient denies any fevers, chills, nausea or vomiting.   KUB no stones visualized.    PMH: Past Medical History:  Diagnosis Date  . Actinic keratosis   . BPH with urinary obstruction    sees urology Dr. [name]  . Carotid stenosis 07/20/2016   Minimal on [place] 07/2016. Monitor clinically.  . Complication of anesthesia    HALLUCINATED AFTER SURGERY  . Depression with anxiety   . Elevated PSA 2014   sees urology - normal biopsies x 3  . History of kidney stones   . HLD (hyperlipidemia)   . Left kidney mass 09/2014   on CT scan thought consistent with simple cyst on prior US  . Nasal obstruction    congenital R sided  . Obstructive pyelonephritis 09/2014   s/p hospitalization and treatment with levaquin  . OSA (obstructive sleep apnea)    pt denies  . Osteoporosis 06/04/2018   DEXA T score -37 at L spine  . Recurrent kidney stones 2014   extensive bilateral nephrolithiasis, sees urology [name]), hydrocodone prn  . Squamous cell carcinoma of skin 07/25/2019   crown scalp  . Squamous cell carcinoma of skin 06/29/2017   SCC IS  ant to crown post  . Symptomatic PVCs    per prior cardiologist, on beta blocker  . Urge incontinence    thought overactive bladder    Surgical History: Past Surgical History:  Procedure Laterality Date  . CARDIAC CATHETERIZATION  03/2012   EF 45-50%, no LVH, + impaired LV relaxation, no PH, no valve abnormalities  . CARDIOVASCULAR STRESS TEST  03/2012   mild peri infarct ischemia in apical segment, decreased EF  . CATARACT EXTRACTION Right 01/2013  . COLONOSCOPY  02/2008   mod diverticulosis o/w WNL (2009)  . CYSTOSCOPY    . CYSTOSCOPY  10/2014   s/p stone removal, with stent removal [name])  . CYSTOSCOPY WITH STENT PLACEMENT Right 11/18/2020   Procedure: CYSTOSCOPY WITH STENT PLACEMENT;  Surgeon: Abbie Sons, MD;  Location: [facility];  Service: Urology;  Laterality: Right;  . CYSTOSCOPY/URETEROSCOPY/HOLMIUM LASER/STENT PLACEMENT Left 08/11/2020   Procedure: CYSTOSCOPY/URETEROSCOPY/STENT PLACEMENT;  Surgeon: Hollice Espy, MD;  Location: [facility];  Service: Urology;  Laterality: Left;  . CYSTOSCOPY/URETEROSCOPY/HOLMIUM LASER/STENT PLACEMENT N/A 08/28/2020   Procedure: CYSTOSCOPY/URETEROSCOPY/HOLMIUM LASER/STENT exchange;  Surgeon: Hollice Espy, MD;  Location: [facility];  Service: Urology;  Laterality: N/A;  . CYSTOSCOPY/URETEROSCOPY/HOLMIUM LASER/STENT PLACEMENT Right 12/09/2020   Procedure: CYSTOSCOPY/URETEROSCOPY/HOLMIUM LASER/STENT Exchange;  Surgeon: Abbie Sons, MD;  Location: [facility];  Service: Urology;  Laterality: Right;  . HOLEP-LASER ENUCLEATION OF THE PROSTATE WITH MORCELLATION N/A 08/11/2020   Procedure: HOLEP-LASER ENUCLEATION  OF THE PROSTATE WITH MORCELLATION;  Surgeon: Hollice Espy, MD;  Location: [facility];  Service: Urology;  Laterality: N/A;  . TONSILECTOMY, ADENOIDECTOMY, BILATERAL MYRINGOTOMY AND TUBES     removed as a child  . US ECHOCARDIOGRAPHY  03/2012   hypokinetic anterior wall, EF 45-50%, impaired relaxation pattern, mild LA dilation, mild-mod MR,  mild-mod AR    Home Medications:  Allergies as of 03/18/2021   No Known Allergies     Medication List       Accurate as of March 17, 2021  1:32 PM. If you have any questions, ask your nurse or doctor.        levofloxacin 500 MG tablet Commonly known as: LEVAQUIN 1 tablet po daily x3 days   metoprolol succinate 25 MG 24 hr tablet Commonly known as: TOPROL-XL TAKE 1/2 TABLET EVERY DAY   mupirocin ointment 2 % Commonly known as: BACTROBAN Apply 1 application topically daily. With dressing changes       Allergies: No Known Allergies  Family History: Family History  Problem Relation Age of Onset  . Urolithiasis Daughter   . Kidney Stones Mother   . Diabetes Neg Hx   . Cancer Neg Hx   . CAD Neg Hx   . Stroke Neg Hx   . Hypertension Neg Hx   . Prostate cancer Neg Hx   . Kidney cancer Neg Hx   . Bladder Cancer Neg Hx     Social History:  reports that he has never smoked. He has never used smokeless tobacco. He reports that he does not drink alcohol and does not use drugs.  ROS: Pertinent ROS in HPI  Physical Exam: Blood pressure (!) 142/78, pulse 76, height 5\' 4"  (1.626 m), weight 145 lb (65.8 kg). Constitutional:  Well nourished. Alert and oriented, No acute distress. HEENT: [place] AT, mask in place.  Trachea midline Cardiovascular: No clubbing, cyanosis, or edema. Respiratory: Normal respiratory effort, no increased work of breathing. Neurologic: Grossly intact, no focal deficits, moving all 4 extremities. Psychiatric: Normal mood and affect.  Laboratory Data: No new labs since last visit   Pertinent Imaging: CLINICAL DATA:  Kidney stones  EXAM: ABDOMEN - 1 VIEW  COMPARISON:  12/01/2020  FINDINGS: Examination for urinary tract calculi is significantly limited by stool burden. Within this limitation, small calculi appear to project over the expected location of the inferior pole of the right kidney. A previously noted right-sided double-J ureteral  stent has been removed. No left-sided calculi are definitely appreciated.  IMPRESSION: Examination for urinary tract calculi is significantly limited by stool burden. Within this limitation, small calculi appear to project over the expected location of the inferior pole of the right kidney. CT is the test of choice the evaluation of renal stone disease.   Electronically Signed   By: Eddie Candle M.D.   On: 03/18/2021 14:27 I have independently reviewed the films.  See HPI   Assessment & Plan:    1. Nephrolithiasis -KUB no stone seen -order 24 hour metabolic work up  2. BPH with LU TS - doing well s/p HoLEP  Return for pending Lithol[employer] results .   These notes generated with voice recognition software. I apologize for typographical errors.  [name], [name]  [facility] [address]  [place], [place] [address] [phone]

## 2021-03-17 NOTE — Progress Notes (Signed)
Error

## 2021-03-18 ENCOUNTER — Encounter: Payer: Medicare HMO | Admitting: Urology

## 2021-04-07 ENCOUNTER — Other Ambulatory Visit: Payer: Self-pay

## 2021-04-07 ENCOUNTER — Ambulatory Visit (INDEPENDENT_AMBULATORY_CARE_PROVIDER_SITE_OTHER): Payer: Medicare HMO | Admitting: Family Medicine

## 2021-04-07 VITALS — BP 118/60 | HR 32 | Temp 97.7°F | Wt 144.0 lb

## 2021-04-07 DIAGNOSIS — R1084 Generalized abdominal pain: Secondary | ICD-10-CM | POA: Insufficient documentation

## 2021-04-07 DIAGNOSIS — I493 Ventricular premature depolarization: Secondary | ICD-10-CM

## 2021-04-07 DIAGNOSIS — R001 Bradycardia, unspecified: Secondary | ICD-10-CM | POA: Diagnosis not present

## 2021-04-07 NOTE — Progress Notes (Signed)
Subjective:     [name] is a 80 y.o. male presenting for Abdominal Pain (2 episodes in the evening after dinner in past 2 weeks *wife made him come )     HPI  #Abdominal pain - notes hx of kidney stones requiring operation - one night after hamburger started having stomach pain on the right side - drank sprite  - pain persisted  - had a second episode that started at 3 am (did also eat hamburger) - got up and took peptobismal and the pain improved - wife worried about him - recent XR with urology for kidney stones - this was not a kidney stone pain - greesy food  - no reflux or heart symptoms - no hx of gerd  #PVCs - on metoprolol - was noticing symptoms - no pain or symptoms  - no lightheaded or dizziness    Review of Systems   Social History   Tobacco Use  Smoking Status Never Smoker  Smokeless Tobacco Never Used        Objective:    BP Readings from Last 3 Encounters:  04/07/21 118/60  03/17/21 (!) 142/78  12/18/20 130/72   Wt Readings from Last 3 Encounters:  04/07/21 144 lb (65.3 kg)  03/17/21 145 lb (65.8 kg)  12/18/20 145 lb (65.8 kg)    BP 118/60   Pulse (!) 32   Temp 97.7 F (36.5 C) (Temporal)   Wt 144 lb (65.3 kg)   SpO2 99%   BMI 24.72 kg/m    Physical Exam Constitutional:      Appearance: Normal appearance. He is not ill-appearing or diaphoretic.  HENT:     Right Ear: External ear normal.     Left Ear: External ear normal.     Nose: Nose normal.  Eyes:     General: No scleral icterus.    Extraocular Movements: Extraocular movements intact.     Conjunctiva/sclera: Conjunctivae normal.  Cardiovascular:     Rate and Rhythm: Bradycardia present. Rhythm irregular.     Comments: Distant sounds Pulmonary:     Effort: Pulmonary effort is normal. No respiratory distress.     Breath sounds: Normal breath sounds. No wheezing.  Abdominal:     General: Bowel sounds are normal.     Palpations: Abdomen is soft. There is  no hepatomegaly.     Tenderness: There is no abdominal tenderness. There is no guarding or rebound. Negative signs include Murphy's sign.  Musculoskeletal:     Cervical back: Neck supple.  Skin:    General: Skin is warm and dry.  Neurological:     Mental Status: He is alert. Mental status is at baseline.  Psychiatric:        Mood and Affect: Mood normal.     EKG: HR 60, sinus with frequent pvcs. Normal PR interval. No st changes      Assessment & Plan:   Problem List Items Addressed This Visit      Cardiovascular and Mediastinum   Symptomatic PVCs    Stable. Repeat EKG with HR 60 but frequent PVCs. Given not bradycardic or symptomatic continue metoprolol 12.5 mg and monitoring for symptoms of bradycardia and f/u if occurring.         Other   Intermittent generalized abdominal pain - Primary    Stable. Suspect some gastritis/heartburn trigger after eating hamburgers. Location not consistent with GB and murphy sign negative. Advised watch and wait and take tums/pepto. If occurring more often trial of  omeprazole and f/u with pcp is not resolving to consider additional work-up.        Other Visit Diagnoses    Bradycardia       Relevant Orders   EKG 12-Lead (Completed)       Return if symptoms worsen or fail to improve.  Lesleigh Noe, MD  This visit occurred during the SARS-CoV-2 public health emergency.  Safety protocols were in place, including screening questions prior to the visit, additional usage of staff PPE, and extensive cleaning of exam room while observing appropriate contact time as indicated for disinfecting solutions.

## 2021-04-07 NOTE — Patient Instructions (Addendum)
Start with tums or pepto-bismal if treatments recurr Avoid hamburgers or other triggers  If worsening or continuing  Try omeprazole 20 mg daily for 2 weeks and see Dr. Danise Mina if you do not get better  Heart rate - monitor for dizziness or lightheadedness or chest pain - follow-up with Dr. Darnell Level if getting symptoms

## 2021-04-07 NOTE — Assessment & Plan Note (Signed)
Stable. Repeat EKG with HR 60 but frequent PVCs. Given not bradycardic or symptomatic continue metoprolol 12.5 mg and monitoring for symptoms of bradycardia and f/u if occurring.

## 2021-04-07 NOTE — Assessment & Plan Note (Signed)
Stable. Suspect some gastritis/heartburn trigger after eating hamburgers. Location not consistent with GB and murphy sign negative. Advised watch and wait and take tums/pepto. If occurring more often trial of omeprazole and f/u with pcp is not resolving to consider additional work-up.

## 2021-04-08 ENCOUNTER — Telehealth: Payer: Self-pay | Admitting: *Deleted

## 2021-04-08 NOTE — Telephone Encounter (Addendum)
Re-faxed form-patient informed understanding.

## 2021-04-08 NOTE — Telephone Encounter (Signed)
Patient called and left a voice mail today and states he has not got the litho link box sent to his house yet. Its been a month .

## 2021-04-08 NOTE — Telephone Encounter (Signed)
[name] you worked with shannon this day did you do a litholink? I was in [place] with [place].

## 2021-04-13 ENCOUNTER — Emergency Department: Payer: Medicare HMO

## 2021-04-13 ENCOUNTER — Telehealth: Payer: Self-pay | Admitting: Family Medicine

## 2021-04-13 ENCOUNTER — Encounter: Payer: Self-pay | Admitting: Emergency Medicine

## 2021-04-13 ENCOUNTER — Emergency Department
Admission: EM | Admit: 2021-04-13 | Discharge: 2021-04-13 | Disposition: A | Discharge status: Home / Self Care | Payer: Medicare HMO | Attending: Emergency Medicine | Admitting: Emergency Medicine

## 2021-04-13 ENCOUNTER — Other Ambulatory Visit: Payer: Self-pay

## 2021-04-13 DIAGNOSIS — I10 Essential (primary) hypertension: Secondary | ICD-10-CM | POA: Insufficient documentation

## 2021-04-13 DIAGNOSIS — K8020 Calculus of gallbladder without cholecystitis without obstruction: Secondary | ICD-10-CM | POA: Diagnosis not present

## 2021-04-13 DIAGNOSIS — R1013 Epigastric pain: Secondary | ICD-10-CM | POA: Diagnosis present

## 2021-04-13 DIAGNOSIS — Z85828 Personal history of other malignant neoplasm of skin: Secondary | ICD-10-CM | POA: Diagnosis not present

## 2021-04-13 DIAGNOSIS — Z79899 Other long term (current) drug therapy: Secondary | ICD-10-CM | POA: Insufficient documentation

## 2021-04-13 DIAGNOSIS — R109 Unspecified abdominal pain: Secondary | ICD-10-CM | POA: Diagnosis not present

## 2021-04-13 LAB — COMPREHENSIVE METABOLIC PANEL
ALT: 20 U/L (ref 0–44)
AST: 22 U/L (ref 15–41)
Albumin: 4 g/dL (ref 3.5–5.0)
Alkaline Phosphatase: 89 U/L (ref 38–126)
Anion gap: 9 (ref 5–15)
BUN: 22 mg/dL (ref 8–23)
CO2: 23 mmol/L (ref 22–32)
Calcium: 8.8 mg/dL — ABNORMAL LOW (ref 8.9–10.3)
Chloride: 107 mmol/L (ref 98–111)
Creatinine, Ser: 1.31 mg/dL — ABNORMAL HIGH (ref 0.61–1.24)
GFR, Estimated: 55 mL/min — ABNORMAL LOW (ref >60)
Glucose, Bld: 128 mg/dL — ABNORMAL HIGH (ref 70–99)
Potassium: 3.7 mmol/L (ref 3.5–5.1)
Sodium: 139 mmol/L (ref 135–145)
Total Bilirubin: 1.7 mg/dL — ABNORMAL HIGH (ref 0.3–1.2)
Total Protein: 7.6 g/dL (ref 6.5–8.1)

## 2021-04-13 LAB — CBC
HCT: 45.6 % (ref 39.0–52.0)
Hemoglobin: 15.4 g/dL (ref 13.0–17.0)
MCH: 29.4 pg (ref 26.0–34.0)
MCHC: 33.8 g/dL (ref 30.0–36.0)
MCV: 87.2 fL (ref 80.0–100.0)
Platelets: 155 10*3/uL (ref 150–400)
RBC: 5.23 MIL/uL (ref 4.22–5.81)
RDW: 13.2 % (ref 11.5–15.5)
WBC: 11.8 10*3/uL — ABNORMAL HIGH (ref 4.0–10.5)
nRBC: 0 % (ref 0.0–0.2)

## 2021-04-13 LAB — LIPASE, BLOOD: Lipase: 23 U/L (ref 11–51)

## 2021-04-13 MED ORDER — ALUM & MAG HYDROXIDE-SIMETH 200-200-20 MG/5ML PO SUSP
30.0000 mL | Freq: Once | ORAL | Status: AC
  Administered 2021-04-13: 30 mL via ORAL
  Filled 2021-04-13: qty 30

## 2021-04-13 MED ORDER — IOHEXOL 300 MG/ML  SOLN
100.0000 mL | Freq: Once | INTRAMUSCULAR | Status: AC | PRN
  Administered 2021-04-13: 100 mL via INTRAVENOUS

## 2021-04-13 MED ORDER — PANTOPRAZOLE SODIUM 40 MG PO TBEC
40.0000 mg | DELAYED_RELEASE_TABLET | Freq: Once | ORAL | Status: AC
  Administered 2021-04-13: 40 mg via ORAL
  Filled 2021-04-13: qty 1

## 2021-04-13 MED ORDER — HYDROCODONE-ACETAMINOPHEN 5-325 MG PO TABS: 1.0000 | ORAL_TABLET | Freq: Four times a day (QID) | ORAL | 0 refills | Status: AC | PRN

## 2021-04-13 MED ORDER — SUCRALFATE 1 G PO TABS
1.0000 g | ORAL_TABLET | Freq: Once | ORAL | Status: AC
  Administered 2021-04-13: 1 g via ORAL
  Filled 2021-04-13: qty 1

## 2021-04-13 NOTE — ED Provider Notes (Signed)
[facility] Emergency Department Provider Note  ____________________________________________   Event Date/Time   First MD Initiated Contact with Patient 04/13/21 1605     (approximate)  I have reviewed the triage vital signs and the nursing notes.   HISTORY  Chief Complaint Abdominal Pain   HPI [name] is a 80 y.o. male with a past medical history of BPH, HDL, kidney stones, OSA, scoliosis, anxiety who presents for assessment of approximately 2 to 3 weeks of some epigastric abdominal pain.  Patient states it is typically precipitated and exacerbated by any food although seem to last a little longer than usual last night after eating chicken.  He has not had any other associated symptoms including fever, chills, headache, earache, sore throat, chest pain, cough, shortness of breath, back pain, lower abdominal pain, diarrhea, constipation, blood in stool, melanotic stools, urinary symptoms, rash or extremity pain.  No recent falls or heavy lifting.  States he has been taking some Imodium and Nexium without any change in his symptoms.  No other acute concerns at this time.  Has never officially been diagnosed with ulcers or gastritis before         Past Medical History:  Diagnosis Date  . Actinic keratosis   . BPH with urinary obstruction    sees urology Dr. [name]  . Carotid stenosis 07/20/2016   Minimal on [place] 07/2016. Monitor clinically.  . Complication of anesthesia    HALLUCINATED AFTER SURGERY  . Depression with anxiety   . Elevated PSA 2014   sees urology - normal biopsies x 3  . History of kidney stones   . HLD (hyperlipidemia)   . Left kidney mass 09/2014   on CT scan thought consistent with simple cyst on prior US  . Nasal obstruction    congenital R sided  . Obstructive pyelonephritis 09/2014   s/p hospitalization and treatment with levaquin  . OSA (obstructive sleep apnea)    pt denies  . Osteoporosis 06/04/2018   DEXA T score  -37 at L spine  . Recurrent kidney stones 2014   extensive bilateral nephrolithiasis, sees urology [name]), hydrocodone prn  . Squamous cell carcinoma of skin 07/25/2019   crown scalp  . Squamous cell carcinoma of skin 06/29/2017   SCC IS ant to crown post  . Symptomatic PVCs    per prior cardiologist, on beta blocker  . Urge incontinence    thought overactive bladder    Patient Active Problem List   Diagnosis Date Noted  . Intermittent generalized abdominal pain 04/07/2021  . Enchondroma of humerus 12/18/2020  . Acute unilateral obstructive uropathy 12/02/2020  . Anemia 09/06/2018  . Hypertension 09/06/2018  . HLD (hyperlipidemia) 08/11/2018  . Elevated parathyroid hormone 06/21/2018  . Osteoporosis 06/04/2018  . History of vertebral compression fracture 05/27/2018  . Lumbar scoliosis 05/27/2018  . Aorto-iliac atherosclerosis ([place]) 05/27/2018  . Chronic nasal congestion 05/27/2018  . Lumbar spondylosis 04/05/2018  . Health maintenance examination 07/20/2016  . Advanced care planning/counseling discussion 07/20/2016  . Carotid stenosis 07/20/2016  . Acute kidney injury ([place]) 07/20/2016  . Pre-op evaluation 10/14/2014  . Left kidney mass 09/12/2014  . Medicare annual wellness visit, subsequent 02/19/2014  . Hyperglycemia 02/19/2014  . BPH with urinary obstruction   . Recurrent kidney stones   . Elevated PSA   . Depression with anxiety   . Symptomatic PVCs   . Dyspnea on exertion 03/30/2012  . Left ventricular dysfunction 03/30/2012  . Anxiety 01/18/2012  . History  of kidney stones 12/28/2011    Past Surgical History:  Procedure Laterality Date  . CARDIAC CATHETERIZATION  03/2012   EF 45-50%, no LVH, + impaired LV relaxation, no PH, no valve abnormalities  . CARDIOVASCULAR STRESS TEST  03/2012   mild peri infarct ischemia in apical segment, decreased EF  . CATARACT EXTRACTION Right 01/2013  . COLONOSCOPY  02/2008   mod diverticulosis o/w WNL (2009)  . CYSTOSCOPY     . CYSTOSCOPY  10/2014   s/p stone removal, with stent removal [name])  . CYSTOSCOPY WITH STENT PLACEMENT Right 11/18/2020   Procedure: CYSTOSCOPY WITH STENT PLACEMENT;  Surgeon: Abbie Sons, MD;  Location: [facility] ORS;  Service: Urology;  Laterality: Right;  . CYSTOSCOPY/URETEROSCOPY/HOLMIUM LASER/STENT PLACEMENT Left 08/11/2020   Procedure: CYSTOSCOPY/URETEROSCOPY/STENT PLACEMENT;  Surgeon: Hollice Espy, MD;  Location: [facility] ORS;  Service: Urology;  Laterality: Left;  . CYSTOSCOPY/URETEROSCOPY/HOLMIUM LASER/STENT PLACEMENT N/A 08/28/2020   Procedure: CYSTOSCOPY/URETEROSCOPY/HOLMIUM LASER/STENT exchange;  Surgeon: Hollice Espy, MD;  Location: [facility] ORS;  Service: Urology;  Laterality: N/A;  . CYSTOSCOPY/URETEROSCOPY/HOLMIUM LASER/STENT PLACEMENT Right 12/09/2020   Procedure: CYSTOSCOPY/URETEROSCOPY/HOLMIUM LASER/STENT Exchange;  Surgeon: Abbie Sons, MD;  Location: [facility] OR[facility];  Service: Urology;  Laterality: Right;  . HOLEP-LASER ENUCLEATION OF THE PROSTATE WITH MORCELLATION N/A 08/11/2020   Procedure: HOLEP-LASER ENUCLEATION OF THE PROSTATE WITH MORCELLATION;  Surgeon: Hollice Espy, MD;  Location: [facility] OR[facility];  Service: Urology;  Laterality: N/A;  . TONSILECTOMY, ADENOIDECTOMY, BILATERAL MYRINGOTOMY AND TUBES     removed as a child  . US ECHOCARDIOGRAPHY  03/2012   hypokinetic anterior wall, EF 45-50%, impaired relaxation pattern, mild LA dilation, mild-mod MR, mild-mod AR    Prior to Admission medications   Medication Sig Start Date End Date Taking? Authorizing Provider  bismuth subsalicylate (PEPTO BISMOL) 262 MG/15ML suspension Take 30 mLs by mouth every 6 (six) hours as needed.   Yes [provider]  HYDROcodone-acetaminophen (NORCO) 5-325 MG tablet Take 1 tablet by mouth every 6 (six) hours as needed for up to 3 days for severe pain. 04/13/21 04/16/21 Yes Lucrezia Starch, MD  ibuprofen (ADVIL) 200 MG tablet Take 400 mg by mouth every 6 (six) hours as needed.   Yes [provider]  metoprolol succinate (TOPROL-XL) 25 MG 24 hr tablet TAKE 1/2 TABLET EVERY DAY 12/24/20  Yes [name], MD    Allergies Patient has no known allergies.  Family History  Problem Relation Age of Onset  . Urolithiasis Daughter   . Kidney Stones Mother   . Diabetes Neg Hx   . Cancer Neg Hx   . CAD Neg Hx   . Stroke Neg Hx   . Hypertension Neg Hx   . Prostate cancer Neg Hx   . Kidney cancer Neg Hx   . Bladder Cancer Neg Hx     Social History Social History   Tobacco Use  . Smoking status: Never Smoker  . Smokeless tobacco: Never Used  Vaping Use  . Vaping Use: Never used  Substance Use Topics  . Alcohol use: No  . Drug use: No    Review of Systems  Review of Systems  Constitutional: Negative for chills and fever.  HENT: Negative for sore throat.   Eyes: Negative for pain.  Respiratory: Negative for cough and stridor.   Cardiovascular: Negative for chest pain.  Gastrointestinal: Positive for abdominal pain. Negative for vomiting.  Genitourinary: Negative for dysuria.  Musculoskeletal: Negative for myalgias.  Skin: Negative for rash.  Neurological: Negative for seizures, loss of  consciousness and headaches.  Psychiatric/Behavioral: Negative for suicidal ideas.  All other systems reviewed and are negative.     ____________________________________________   PHYSICAL EXAM:  VITAL SIGNS: ED Triage Vitals  Enc Vitals Group     BP 04/13/21 1446 115/80     Pulse Rate 04/13/21 1446 87     Resp 04/13/21 1446 16     Temp 04/13/21 1446 98.4 F (36.9 C)     Temp Source 04/13/21 1446 Oral     SpO2 04/13/21 1446 98 %     Weight 04/13/21 1447 143 lb 15.4 oz (65.3 kg)     Height 04/13/21 1447 5\' 7"  (1.702 m)     Head Circumference --      Peak Flow --      Pain Score 04/13/21 1447 8     Pain Loc --      Pain Edu? --      Excl. in [place]? --    Vitals:   04/13/21 1446  BP: 115/80  Pulse: 87  Resp: 16  Temp: 98.4 F (36.9 C)  SpO2: 98%    Physical Exam Vitals and nursing note reviewed.  Constitutional:      Appearance: He is well-developed.  HENT:     Head: Normocephalic and atraumatic.     Right Ear: External ear normal.     Left Ear: External ear normal.     Nose: Nose normal.  Eyes:     Conjunctiva/sclera: Conjunctivae normal.  Cardiovascular:     Rate and Rhythm: Normal rate and regular rhythm.     Heart sounds: No murmur heard.   Pulmonary:     Effort: Pulmonary effort is normal. No respiratory distress.     Breath sounds: Normal breath sounds.  Abdominal:     Palpations: Abdomen is soft.     Tenderness: There is abdominal tenderness in the epigastric area. There is no right CVA tenderness or left CVA tenderness.  Musculoskeletal:     Cervical back: Neck supple.  Skin:    General: Skin is warm and dry.     Capillary Refill: Capillary refill takes less than 2 seconds.  Neurological:     Mental Status: He is alert and oriented to person, place, and time.  Psychiatric:        Mood and Affect: Mood normal.      ____________________________________________   LABS (all labs ordered are listed, but only abnormal results are displayed)  Labs Reviewed  COMPREHENSIVE METABOLIC PANEL - Abnormal; Notable for the following components:      Result Value   Glucose, Bld 128 (*)    Creatinine, Ser 1.31 (*)    Calcium 8.8 (*)    Total Bilirubin 1.7 (*)    GFR, Estimated 55 (*)    All other components within normal limits  CBC - Abnormal; Notable for the following components:   WBC 11.8 (*)    All other components within normal limits  LIPASE, BLOOD  URINALYSIS, COMPLETE (UACMP) WITH MICROSCOPIC   ____________________________________________  EKG  ____________________________________________  RADIOLOGY  ED MD interpretation: Cholelithiasis with a gallstone at the cystic duct without evidence of acute cholecystitis or stones in the common bile duct.  No evidence of pancreatitis, diverticulitis or  other emergent intra-abdominal pelvic pathology.  Official radiology report(s): CT ABDOMEN PELVIS W CONTRAST  Result Date: 04/13/2021 CLINICAL DATA:  Intermittent abdominal pain for several weeks, postprandial pain EXAM: CT ABDOMEN AND PELVIS WITH CONTRAST TECHNIQUE: Multidetector CT imaging of the abdomen and  pelvis was performed using the standard protocol following bolus administration of intravenous contrast. CONTRAST:  150mL OMNIPAQUE IOHEXOL 300 MG/ML  SOLN COMPARISON:  03/17/2021, 11/18/2020 FINDINGS: Lower chest: No acute pleural or parenchymal lung disease. Stable hiatal hernia. Hepatobiliary: Multiple calcified gallstones are identified. No gallbladder wall thickening or pericholecystic fluid to suggest cholecystitis. Small calculi are seen within the cystic duct. No evidence of common bile duct dilation or common bile duct stones. Small hepatic cysts are again identified. The remainder of the liver is unremarkable. Pancreas: Unremarkable. No pancreatic ductal dilatation or surrounding inflammatory changes. Spleen: Normal in size without focal abnormality. Adrenals/Urinary Tract: There are multiple punctate less than 2 mm nonobstructing bilateral renal calculi. The obstructing distal right ureteral calculus seen on prior study has cleared in the interim. There is bilateral renal cortical thinning, with stable left renal cortical cyst. The adrenals and bladder are unremarkable. Stomach/Bowel: No bowel obstruction or ileus. There is diffuse colonic diverticulosis without diverticulitis. Normal appendix right lower quadrant. No bowel wall thickening or inflammatory change. Vascular/Lymphatic: Stable diffuse atherosclerosis of the aorta and its branches. No pathologic adenopathy within the abdomen or pelvis. Reproductive: Prostate remains enlarged, with likely prior TURP defect in the left aspect of the central gland. Appearance is stable. Other: No free fluid or free intraperitoneal gas. No abdominal wall  hernia. Musculoskeletal: No acute or destructive bony lesions. Chronic compression deformities at T11, T12, L1, and L4. Reconstructed images demonstrate no additional findings. IMPRESSION: 1. Cholelithiasis, with small gallstones identified in the cystic duct. No evidence of acute cholecystitis. No evidence of biliary dilation or common bile duct stones. 2. Punctate less than 2 mm nonobstructing bilateral renal calculi. 3. Colonic diverticulosis without diverticulitis. 4. Stable hiatal hernia. 5. Stable enlarged prostate with likely eccentric TURP defect on the left. 6.  Aortic Atherosclerosis (ICD10-I70.0). Electronically Signed   By: Randa Ngo M.D.   On: 04/13/2021 18:15    ____________________________________________   PROCEDURES  Procedure(s) performed (including Critical Care):  Procedures   ____________________________________________   INITIAL IMPRESSION / ASSESSMENT AND PLAN / ED COURSE      Patient presents with above to history exam for assessment of intermittent epigastric abdominal pain on the last 2 to 3 weeks.  Seems precipitated and exacerbated by food.  He denies any significant NSAID or EtOH use.  No history of gastric ulcers or gastritis or duodenitis.  On arrival he is afebrile and hemodynamically stable.  Primary differential includes peptic versus duodenal erosive disease, cholecystitis, symptomatic cholelithiasis , pancreatitis, diverticulitis, kidney stone, and metabolic derangements.   CT abdomen pelvis remarkable for cholelithiasis with a gallstone at the cystic duct without evidence of acute cholecystitis or stones in the common bile duct.  No evidence of pancreatitis, diverticulitis or other emergent intra-abdominal pelvic pathology.  CMP with no significant electrolyte or metabolic derangements.  Lipase not consistent with acute pancreatitis.  CBC shows WBC count of 11.8 but no anemia or other significant derangements.  Patient treated with below noted GI  cocktail and on reassessment stated he felt much better.  Advised concern for likely symptomatic gallstones and recommendation for close outpatient surgery follow-up.  Rx written for analgesia.  Given absence of evidence of cholecystitis or other Mi life-threatening pathology I think he is safe for discharge with outpatient surgery follow-up.  Discharged stable condition.  Strict return precautions advised and discussed.     ____________________________________________   FINAL CLINICAL IMPRESSION(S) / ED DIAGNOSES  Final diagnoses:  Symptomatic cholelithiasis    Medications  alum &  mag hydroxide-simeth (MAALOX/MYLANTA) 200-200-20 MG/5ML suspension 30 mL (30 mLs Oral Given 04/13/21 1708)  sucralfate (CARAFATE) tablet 1 g (1 g Oral Given 04/13/21 1708)  pantoprazole (PROTONIX) EC tablet 40 mg (40 mg Oral Given 04/13/21 1707)  iohexol (OMNIPAQUE) 300 MG/ML solution 100 mL (100 mLs Intravenous Contrast Given 04/13/21 1724)     ED Discharge Orders         Ordered    HYDROcodone-acetaminophen (NORCO) 5-325 MG tablet  Every 6 hours PRN        04/13/21 1828           Note:  This document was prepared using Dragon voice recognition software and may include unintentional dictation errors.   Lucrezia Starch, MD 04/13/21 [phone]

## 2021-04-13 NOTE — Telephone Encounter (Signed)
Will you plz triage pt? 

## 2021-04-13 NOTE — ED Triage Notes (Signed)
C/O abdominal pain 'off and on' for 2-3 weeks.  Seen by PCP for same.  AAOx3.  Skin warm and dry. NAD.  States pain worse after eating a hamburger.

## 2021-04-13 NOTE — Telephone Encounter (Signed)
[name] called in wanted to get [name] looked at due to he is still having issues with stomach or heart he was seen last week for the same issue, its still stomach issues

## 2021-04-13 NOTE — Telephone Encounter (Signed)
Currently being evaluated at the ER.

## 2021-04-13 NOTE — ED Notes (Signed)
Provided DC instructions. Verbalized understanding.  

## 2021-04-13 NOTE — Telephone Encounter (Signed)
I spoke with pt; pt said everything he eats hurts his stomach; pt seen 04/07/21 with heart and stomach issues; pt has been taking immodium and EKG was abnormal. Pt is concerned he has an ulcer; pt ate [place] chicken last night and gave pt sharpe lower abd pain at belly button; the pain is continuous and pain level is 10; pt did not sleep at all last night due to pain and pt still has lower abd pain level of 10.  Was talking with pt and call ended. I tried multiple times to call pt back and land line said not able to accept call now and try later and no answer at pt's or pts wife. I was able to get pts wife on phone; no fever,N&V or diarrhea. Pt has hx of kidney stones but pt does not feel like this is the same pain that he has when he has a stone. Pt will go to [facility] ED when the son gets there at lunch. If pt condition changes or worsens prior to son getting there Mrs Willert will call 911 to take pt to ED. Sending note to Dr Darnell Level and Lattie Haw CMA.

## 2021-04-14 NOTE — Telephone Encounter (Signed)
Seen at ER, dx symptomatic gallstones.  plz call for update- how are symptoms today? Was he planning to see gen surgery? Does he need referral or would he like f/u in office with me?

## 2021-04-14 NOTE — Telephone Encounter (Signed)
Tried to call patient at home number and there was no answer and no answering machine. [place] and left a message on patient's cell number to call the office back.

## 2021-04-14 NOTE — Telephone Encounter (Signed)
Spoke to patient by telephone and was advised that he was given pain medication and his symptoms are much better today. Patient stated that the pain medication is helping. Patient stated on his discharge paperwork he is to call a surgeon and set up an appointment. Patient stated that the name of the surgeon is Benjamine Sprague DO and they have his phone number. Advised [name] if he has a problem setting up the appointment to call the office back and let us know. Patient and his wife was advised if he needs a referral to the surgeon Dr. Danise Mina will take care of it for him.

## 2021-04-16 ENCOUNTER — Other Ambulatory Visit: Payer: Self-pay | Admitting: Dermatology

## 2021-04-16 DIAGNOSIS — L603 Nail dystrophy: Secondary | ICD-10-CM

## 2021-04-17 ENCOUNTER — Telehealth: Payer: Self-pay | Admitting: *Deleted

## 2021-04-17 MED ORDER — HYDROCODONE-ACETAMINOPHEN 5-325 MG PO TABS: 1.0000 | ORAL_TABLET | Freq: Two times a day (BID) | ORAL | 0 refills | Status: DC | PRN

## 2021-04-17 NOTE — Telephone Encounter (Signed)
Will refill hydrocodone short course.  Please have him call gen surg to try and get appt moved to sooner.  Avoid fatty meals.  If any fever, jaundice or vomiting will need to return to ER for evaluation.  He previously declined ER f/u with me.

## 2021-04-17 NOTE — Telephone Encounter (Signed)
Pt left VM at Triage requesting refill of norco 5-325mg . Pt was given 6 tabs on 04/13/21 at ER. Pt is out of meds and wants a refill, he said it will be a while before he is able to have surgery and he is in a lot of pain. Pt said he would like med filled today  [facility]

## 2021-04-20 NOTE — Telephone Encounter (Signed)
Patient notified as instructed by telephone. Patient stated that he is not able to go to the surgeon until 05/04/21 because his step son is out of town now. Patient stated that the pain has eased up and he is not using much of the pain medication at this point. Patient stated that he wanted to have pain medication on hand in case he needed. Patient stated that he is eating primarily vegetables which seems to stop him from having pain.  Patient was given ER precautions and he verbalized understanding.

## 2021-05-04 ENCOUNTER — Ambulatory Visit: Payer: Self-pay | Admitting: Surgery

## 2021-05-04 DIAGNOSIS — K811 Chronic cholecystitis: Secondary | ICD-10-CM | POA: Diagnosis not present

## 2021-05-04 NOTE — H&P (Signed)
Subjective:   CC: Chronic cholecystitis [K81.1]  HPI:  [name] is a 79 y.o. male who was referred by [facility] for evaluation of above CC. Symptoms were first noted a few weeks ago. Pain is sharp, confined to the epigastric area, without radiation.  Associated with nothing specific, exacerbated by eating greasy foods.  ED workup consistent with symptomatic choleithiasis with cystic duct obstruction.     Past Medical History:  has a past medical history of Anxiety, BPH with urinary obstruction, Depression, Elevated PSA, Hyperglycemia, Left kidney mass, Recurrent kidney stones, and Symptomatic PVCs.  Past Surgical History:  has a past surgical history that includes WISDOM TEETH.  Family History: family history includes Heart disease in his father; No Known Problems in his mother.  Social History:  reports that he has never smoked. He has never used smokeless tobacco. He reports that he does not drink alcohol and does not use drugs.  Current Medications: has a current medication list which includes the following prescription(s): metoprolol succinate, cephalexin, and finasteride.  Allergies:  Allergies as of 05/04/2021  . (No Known Allergies)    ROS:  A 15 point review of systems was performed and pertinent positives and negatives noted in HPI    Objective:     BP 131/72   Pulse 58   Ht 175.3 cm (5' 9")   Wt 65.8 kg (145 lb)   BMI 21.41 kg/m    Constitutional :  alert, appears stated age, cooperative and no distress  Lymphatics/Throat:  no asymmetry, masses, or scars  Respiratory:  clear to auscultation bilaterally  Cardiovascular:  regular rate and rhythm  Gastrointestinal: soft, non-tender; bowel sounds normal; no masses,  no organomegaly.    Musculoskeletal: Steady gait and movement  Skin: Cool and moist, no jaundice  Psychiatric: Normal affect, non-agitated, not confused       LABS:  n/a   RADS: n/a  Assessment:      Chronic cholecystitis [K81.1] ED labs and  imaging as well as history consistent with dx above.  Plan:     1. Chronic cholecystitis [K81.1] Discussed the risk of surgery including post-op infxn, seroma, biloma, chronic pain, poor-delayed wound healing, retained gallstone, conversion to open procedure, post-op SBO or ileus, and need for additional procedures to address said risks.  The risks of general anesthetic including MI, CVA, sudden death or even reaction to anesthetic medications also discussed. Alternatives include continued observation.  Benefits include possible symptom relief, prevention of complications including acute cholecystitis, pancreatitis.  Typical post operative recovery of 3-5 days rest, continued pain in area and incision sites, possible loose stools up to 4-6 weeks, also discussed.  ED return precautions given for sudden increase in RUQ pain, with possible accompanying fever, nausea, and/or vomiting.  The patient understands the risks, any and all questions were answered to the patient's satisfaction.  2. Patient has elected to proceed with surgical treatment. Procedure will be scheduled.  Written consent was obtained. robotic assisted laparoscopic   

## 2021-05-04 NOTE — H&P (View-Only) (Signed)
Subjective:   CC: Chronic cholecystitis [K81.1]  HPI:  [name] is a 80 y.o. male who was referred by Tamala Julian for evaluation of above CC. Symptoms were first noted a few weeks ago. Pain is sharp, confined to the epigastric area, without radiation.  Associated with nothing specific, exacerbated by eating greasy foods.  ED workup consistent with symptomatic choleithiasis with cystic duct obstruction.     Past Medical History:  has a past medical history of Anxiety, BPH with urinary obstruction, Depression, Elevated PSA, Hyperglycemia, Left kidney mass, Recurrent kidney stones, and Symptomatic PVCs.  Past Surgical History:  has a past surgical history that includes WISDOM TEETH.  Family History: family history includes Heart disease in his father; No Known Problems in his mother.  Social History:  reports that he has never smoked. He has never used smokeless tobacco. He reports that he does not drink alcohol and does not use drugs.  Current Medications: has a current medication list which includes the following prescription(s): metoprolol succinate, cephalexin, and finasteride.  Allergies:  Allergies as of 05/04/2021  . (No Known Allergies)    ROS:  A 15 point review of systems was performed and pertinent positives and negatives noted in HPI    Objective:     BP 131/72   Pulse 58   Ht 175.3 cm (5\' 9" )   Wt 65.8 kg (145 lb)   BMI 21.41 kg/m    Constitutional :  alert, appears stated age, cooperative and no distress  Lymphatics/Throat:  no asymmetry, masses, or scars  Respiratory:  clear to auscultation bilaterally  Cardiovascular:  regular rate and rhythm  Gastrointestinal: soft, non-tender; bowel sounds normal; no masses,  no organomegaly.    Musculoskeletal: Steady gait and movement  Skin: Cool and moist, no jaundice  Psychiatric: Normal affect, non-agitated, not confused       LABS:  n/a   RADS: n/a  Assessment:      Chronic cholecystitis [K81.1] ED labs and  imaging as well as history consistent with dx above.  Plan:     1. Chronic cholecystitis [K81.1] Discussed the risk of surgery including post-op infxn, seroma, biloma, chronic pain, poor-delayed wound healing, retained gallstone, conversion to open procedure, post-op SBO or ileus, and need for additional procedures to address said risks.  The risks of general anesthetic including MI, CVA, sudden death or even reaction to anesthetic medications also discussed. Alternatives include continued observation.  Benefits include possible symptom relief, prevention of complications including acute cholecystitis, pancreatitis.  Typical post operative recovery of 3-5 days rest, continued pain in area and incision sites, possible loose stools up to 4-6 weeks, also discussed.  ED return precautions given for sudden increase in RUQ pain, with possible accompanying fever, nausea, and/or vomiting.  The patient understands the risks, any and all questions were answered to the patient's satisfaction.  2. Patient has elected to proceed with surgical treatment. Procedure will be scheduled.  Written consent was obtained. robotic assisted laparoscopic

## 2021-05-21 ENCOUNTER — Encounter
Admission: RE | Admit: 2021-05-21 | Discharge: 2021-05-21 | Disposition: A | Discharge status: Home / Self Care | Payer: Medicare HMO | Source: Ambulatory Visit | Attending: Surgery | Admitting: Surgery

## 2021-05-21 ENCOUNTER — Encounter: Payer: Self-pay | Admitting: Surgery

## 2021-05-21 ENCOUNTER — Other Ambulatory Visit: Payer: Self-pay

## 2021-05-21 NOTE — Patient Instructions (Signed)
Your procedure is scheduled on: 05/29/21 Report to [facility]. To find out your arrival time please call [phone] between 1PM - 3PM on 05/28/21.  Remember: Instructions that are not followed completely may result in serious medical risk, up to and including death, or upon the discretion of your surgeon and anesthesiologist your surgery may need to be rescheduled.     _X__ 1. Do not eat food after midnight the night before your procedure.                 No gum chewing or hard candies. You may drink clear liquids up to 2 hours                 before you are scheduled to arrive for your surgery- nothing after except a sip or two of water with your medication                 Clear Liquids include:  water, apple juice without pulp, clear carbohydrate                 drink such as Clearfast or Gatorade, Black Coffee or Tea (Do not add                 anything to coffee or tea). Diabetics water only  __X__2.  On the morning of surgery brush your teeth with toothpaste and water, you                 may rinse your mouth with mouthwash if you wish.  Do not swallow any              toothpaste of mouthwash.     _X__ 3.  No Alcohol for 24 hours before or after surgery.   _X__ 4.  Do Not Smoke or use e-cigarettes For 24 Hours Prior to Your Surgery.                 Do not use any chewable tobacco products for at least 6 hours prior to                 surgery.  ____  5.  Bring all medications with you on the day of surgery if instructed.   __X__  6.  Notify your doctor if there is any change in your medical condition      (cold, fever, infections).     Do not wear jewelry, make-up, hairpins, clips or nail polish. Do not wear lotions, powders, or perfumes. Deodorant ok Do not shave body hair for 48 hours prior to surgery. Men may shave face and neck. Do not bring valuables to the hospital.    [facility] is not responsible for any belongings  or valuables.  Contacts, dentures/partials or body piercings may not be worn into surgery. Bring a case for your contacts, glasses or hearing aids, a denture cup will be supplied. Leave your suitcase in the car. After surgery it may be brought to your room. For patients admitted to the hospital, discharge time is determined by your treatment team.   Patients discharged the day of surgery will not be allowed to drive home.   Please read over the following fact sheets that you were given:   CHG soap  __X__ Take these medicines the morning of surgery with A SIP OF WATER:    1. Metoprolol  2.   3.   4.  5.  6.  ____ Fleet Enema (as directed)   __X__ Use CHG Soap/SAGE wipes as directed  ____ Use inhalers on the day of surgery  ____ Stop metformin/Janumet/Farxiga 2 days prior to surgery    ____ Take 1/2 of usual insulin dose the night before surgery. No insulin the morning          of surgery.   ____ Stop Blood Thinners Coumadin/Plavix/Xarelto/Pleta/Pradaxa/Eliquis/Effient/Aspirin  on   Or contact your Surgeon, Cardiologist or Medical Doctor regarding  ability to stop your blood thinners  __X__ Stop Anti-inflammatories 7 days before surgery such as Advil, Ibuprofen, Motrin,  BC or Goodies Powder, Naprosyn, Naproxen, Aleve, Aspirin    __X__ Stop all herbal supplements, fish oil or vitamin E until after surgery.    ____ Bring C-Pap to the hospital.

## 2021-05-22 ENCOUNTER — Encounter
Admission: RE | Admit: 2021-05-22 | Discharge: 2021-05-22 | Disposition: A | Discharge status: Home / Self Care | Payer: Medicare HMO | Source: Ambulatory Visit | Attending: Surgery | Admitting: Surgery

## 2021-05-22 DIAGNOSIS — Z01818 Encounter for other preprocedural examination: Secondary | ICD-10-CM | POA: Diagnosis not present

## 2021-05-22 DIAGNOSIS — Z01810 Encounter for preprocedural cardiovascular examination: Secondary | ICD-10-CM | POA: Diagnosis not present

## 2021-05-25 ENCOUNTER — Encounter: Payer: Self-pay | Admitting: Surgery

## 2021-05-25 NOTE — Progress Notes (Signed)
Perioperative Services  Pre-Admission/Anesthesia Testing Clinical Review  Date: 05/25/21  Patient Demographics:  Name: [name] DOB:   February 10, 1941 MRN:   [id number]  Planned Surgical Procedure(s):    Case: [id number] Date/Time: 05/29/21 0715   Procedure: XI ROBOTIC ASSISTED LAPAROSCOPIC CHOLECYSTECTOMY (Abdomen)   Anesthesia type: General   Pre-op diagnosis: cholecystitis   Location: [facility] OR ROOM 04 / [facility]   Surgeons: Benjamine Sprague, DO     NOTE: Available PAT nursing documentation and vital signs have been reviewed. Clinical nursing staff has updated patient's PMH/PSHx, current medication list, and drug allergies/intolerances to ensure comprehensive history available to assist in medical decision making as it pertains to the aforementioned surgical procedure and anticipated anesthetic course. Extensive review of available clinical information performed. [name] PMH and PSHx updated with any diagnoses/procedures that  may have been inadvertently omitted during his intake with the pre-admission testing department's nursing staff.  Clinical Discussion:  [name] is a 80 y.o. male who is submitted for pre-surgical anesthesia review and clearance prior to him undergoing the above procedure. Patient has never been a smoker. Pertinent PMH includes: paroxysmal atrial fibrillation, cardiac murmur, symptomatic PVCs, carotid stenosis, HLD, shortness of breath, OSAH (does not use nocturnal PAP therapy), BPH, obstructive pyelonephritis, recurrent nephrolithiasis, OA, depression, anxiety.  Patient is followed by cardiology Ubaldo Glassing, MD). He was last seen in the cardiology clinic on 08/06/2020; notes reviewed.  At the time of his clinic visit, patient denied any episodes of chest pain.  He continued to experience occasional episodes of shortness of breath.  No PND, orthopnea, palpitations, peripheral edema, vertiginous symptoms, or presyncope/syncope.  Past medical  history significant for cardiovascular diagnoses.  Cardiac event monitor study in 02/2012 demonstrated a predominantly underlying normal sinus rhythm with an average heart rate of 67 bpm. Maximum heart rate was 103 bpm and minimum heart rate 50 bpm. There were frequent PVCs with multiple runs of couplets and triplets and up to quadruplets of multiple foci.  There were no significant pauses or agonal arrhythmias.  Patient was started on beta-blocker (carvedilol) therapy.  Myocardial perfusion imaging study performed on 03/09/2012 revealed mild peri infarct ischemia in the apical segment.  Patient's LVEF was decreased at 30%.  Echocardiogram performed on 03/13/2012 demonstrated a mildly reduced left ventricular systolic function with an EF of 45-50%.  Left atrium was mildly dilated.  There was mild to moderate mitral and aortic valve insufficiency.  Diagnostic left heart catheterization performed on 03/17/2012 at [facility] by Dr. Janice Norrie revealed mild (20-30%) nonobstructive CAD.  LVEDP was not measured.  Repeat myocardial perfusion imaging study performed on 10/21/2014 demonstrated no evidence of stress-induced myocardial ischemia or arrhythmia.  Global systolic function normal.  LVEF 58%  Blood pressure well controlled at 128/80.  Patient on beta-blocker therapy (metoprolol succinate) at this point for symptomatic PVCs.  Of note, carvedilol caused patient to experience fatigue, thus necessitating a change in therapy.  Patient not currently on any prescribed lipid-lowering therapies.  ECG in the office that day revealed normal sinus rhythm at a rate of 62 with no evidence of ST elevation or depression. Functional capacity, as defined by DASI, is documented as being >/= 4 METS.  No changes were made to patient's medication regimen.  Patient to follow-up with outpatient cardiology in at defined intervals for ongoing care management.  Patient is scheduled for an elective laparoscopic  cholecystectomy on 05/29/2021 with Dr. Benjamine Sprague.  Given patient's past medical history significant for cardiovascular  diagnoses, presurgical cardiac clearance was sought by the PAT team. Per cardiology, "this patient is optimized for surgery and may proceed with the planned procedural course with a LOW risk stratification".  This patient is on daily anticoagulation/antiplatelet therapy.  Patient reports previous perioperative complications with anesthesia in the past. He describes a PMH (+) for postoperative delirium. In review of the available records, it is noted that patient underwent a general anesthetic course here (ASA II) in 11/2020 without documented complications.   Vitals with BMI 05/21/2021 04/13/2021 04/13/2021  Height 5\' 7"  - 5\' 7"   Weight 150 lbs - 143 lbs 15 oz  BMI 17.49 - 44.96  Systolic - 759 -  Diastolic - 72 -  Pulse - 82 -    Providers/Specialists:   NOTE: Primary physician provider listed below. Patient may have been seen by APP or partner within same practice.   PROVIDER ROLE / SPECIALTY LAST Shirline Frees, DO  General Surgery 05/04/2021  [name], MD  Primary Care Provider 12/02/2020  Bartholome Bill, MD  Cardiology 08/06/2020   Allergies:  Patient has no known allergies.  Current Home Medications:   No current facility-administered medications for this encounter.    metoprolol succinate (TOPROL-XL) 25 MG 24 hr tablet   acetaminophen (TYLENOL) 500 MG tablet   Calcium Carb-Cholecalciferol (CALCIUM 500 + D) 500-125 MG-UNIT TABS   HYDROcodone-acetaminophen (NORCO/VICODIN) 5-325 MG tablet   History:   Past Medical History:  Diagnosis Date   Actinic keratosis    BPH with urinary obstruction    sees urology Dr. [name]   Cardiac murmur    grade II/VI mid-systolic; "blowing" and lower LSB   Carotid stenosis 07/20/2016   Minimal on [place] 07/2016. Monitor clinically.   Complication of anesthesia    (+) postoperative delirium; "hallucinations"    Depression with anxiety    Elevated PSA 2014   sees urology - normal biopsies x 3   HLD (hyperlipidemia)    Left kidney mass 09/2014   on CT scan thought consistent with simple cyst on prior US   Nasal obstruction    congenital R sided   Obstructive pyelonephritis 09/2014   s/p hospitalization and treatment with levaquin   OSA (obstructive sleep apnea)    pt denies   Osteoporosis 06/04/2018   DEXA T score -37 at L spine   Paroxysmal atrial fibrillation (HCC)    Recurrent kidney stones 2014   extensive bilateral nephrolithiasis, sees urology [name]), hydrocodone prn   SOB (shortness of breath)    Squamous cell carcinoma of skin 07/25/2019   crown scalp   Squamous cell carcinoma of skin 06/29/2017   SCC IS ant to crown post   Symptomatic PVCs    per prior cardiologist, on beta blocker   Urge incontinence    thought overactive bladder   Past Surgical History:  Procedure Laterality Date   CARDIAC CATHETERIZATION  03/2012   EF 45-50%, no LVH, + impaired LV relaxation, no PH, no valve abnormalities   CARDIOVASCULAR STRESS TEST  03/2012   mild peri infarct ischemia in apical segment, decreased EF   CATARACT EXTRACTION Right 01/2013   COLONOSCOPY  02/2008   mod diverticulosis o/w WNL (2009)   CYSTOSCOPY  10/2014   s/p stone removal, with stent removal [name])   CYSTOSCOPY WITH STENT PLACEMENT Right 11/18/2020   Procedure: CYSTOSCOPY WITH STENT PLACEMENT;  Surgeon: Abbie Sons, MD;  Location: [facility] ORS;  Service: Urology;  Laterality: Right;   CYSTOSCOPY/URETEROSCOPY/HOLMIUM LASER/STENT PLACEMENT Left 08/11/2020  Procedure: CYSTOSCOPY/URETEROSCOPY/STENT PLACEMENT;  Surgeon: Hollice Espy, MD;  Location: [facility] ORS;  Service: Urology;  Laterality: Left;   CYSTOSCOPY/URETEROSCOPY/HOLMIUM LASER/STENT PLACEMENT N/A 08/28/2020   Procedure: CYSTOSCOPY/URETEROSCOPY/HOLMIUM LASER/STENT exchange;  Surgeon: Hollice Espy, MD;  Location: [facility] ORS;  Service: Urology;  Laterality: N/A;    CYSTOSCOPY/URETEROSCOPY/HOLMIUM LASER/STENT PLACEMENT Right 12/09/2020   Procedure: CYSTOSCOPY/URETEROSCOPY/HOLMIUM LASER/STENT Exchange;  Surgeon: Abbie Sons, MD;  Location: [facility] ORS;  Service: Urology;  Laterality: Right;   HOLEP-LASER ENUCLEATION OF THE PROSTATE WITH MORCELLATION N/A 08/11/2020   Procedure: HOLEP-LASER ENUCLEATION OF THE PROSTATE WITH MORCELLATION;  Surgeon: Hollice Espy, MD;  Location: [facility] ORS;  Service: Urology;  Laterality: N/A;   TONSILECTOMY, ADENOIDECTOMY, BILATERAL MYRINGOTOMY AND TUBES     removed as a child   US ECHOCARDIOGRAPHY  03/2012   hypokinetic anterior wall, EF 45-50%, impaired relaxation pattern, mild LA dilation, mild-mod MR, mild-mod AR   Family History  Problem Relation Age of Onset   Kidney Stones Mother    CAD Father    Kidney Stones Daughter    Diabetes Neg Hx    Cancer Neg Hx    Stroke Neg Hx    Hypertension Neg Hx    Prostate cancer Neg Hx    Kidney cancer Neg Hx    Bladder Cancer Neg Hx    Social History   Tobacco Use   Smoking status: Never   Smokeless tobacco: Never  Vaping Use   Vaping Use: Never used  Substance Use Topics   Alcohol use: No   Drug use: No    Pertinent Clinical Results:  LABS: Labs reviewed: Acceptable for surgery.  No visits with results within 3 Day(s) from this visit.  Latest known visit with results is:  Admission on 04/13/2021, Discharged on 04/13/2021  Component Date Value Ref Range Status   Lipase 04/13/2021 23  11 - 51 U/L Final   Performed at [facility], [place], [place] [address]   Sodium 04/13/2021 139  135 - 145 mmol/L Final   Potassium 04/13/2021 3.7  3.5 - 5.1 mmol/L Final   Chloride 04/13/2021 107  98 - 111 mmol/L Final   CO2 04/13/2021 23  22 - 32 mmol/L Final   Glucose, Bld 04/13/2021 128 (A) 70 - 99 mg/dL Final   Glucose reference range applies only to samples taken after fasting for at least 8 hours.   BUN 04/13/2021 22  8 - 23 mg/dL Final    Creatinine, Ser 04/13/2021 1.31 (A) 0.61 - 1.24 mg/dL Final   Calcium 04/13/2021 8.8 (A) 8.9 - 10.3 mg/dL Final   Total Protein 04/13/2021 7.6  6.5 - 8.1 g/dL Final   Albumin 04/13/2021 4.0  3.5 - 5.0 g/dL Final   AST 04/13/2021 22  15 - 41 U/L Final   ALT 04/13/2021 20  0 - 44 U/L Final   Alkaline Phosphatase 04/13/2021 89  38 - 126 U/L Final   Total Bilirubin 04/13/2021 1.7 (A) 0.3 - 1.2 mg/dL Final   GFR, Estimated 04/13/2021 55 (A) >60 mL/min Final   Comment: (NOTE) Calculated using the CKD-EPI Creatinine Equation (2021)    Anion gap 04/13/2021 9  5 - 15 Final   Performed at [facility], [place], [place] [address]   WBC 04/13/2021 11.8 (A) 4.0 - 10.5 K/uL Final   RBC 04/13/2021 5.23  4.22 - 5.81 MIL/uL Final   Hemoglobin 04/13/2021 15.4  13.0 - 17.0 g/dL Final   HCT 04/13/2021 45.6  39.0 - 52.0 %  Final   MCV 04/13/2021 87.2  80.0 - 100.0 fL Final   MCH 04/13/2021 29.4  26.0 - 34.0 pg Final   MCHC 04/13/2021 33.8  30.0 - 36.0 g/dL Final   RDW 04/13/2021 13.2  11.5 - 15.5 % Final   Platelets 04/13/2021 155  150 - 400 K/uL Final   nRBC 04/13/2021 0.0  0.0 - 0.2 % Final   Performed at [facility], [address] [place], [place] [address]    ECG: Date: 05/22/2021 Time ECG obtained: 1508 PM Rate: 53 bpm Rhythm: sinus bradycardia Axis (leads I and aVF): Normal Intervals: PR 176 ms. QRS 84 ms. QTc 424 ms. ST segment and T wave changes: No evidence of acute ST segment elevation or depression Comparison: Similar to previous tracing obtained on 11/18/2020   IMAGING / PROCEDURES: LEXISCAN performed on 10/21/2014 Negative stress sestimibi. No significant wall motion abnormality noted. Overall, this is a Low risk scan. Pharmacological myocardial perfusion study with no significant ischemia The estimated ejection fraction is 58%. The left ventricular global function was normal. There are no EKG changes concerning for ischemia There is no  artifact noted on this study.  LONG TERM CARDIAC EVENT MONITOR STUDY performed in 02/2012 Predominantly underlying normal sinus rhythm with an average heart rate of 67 bpm Maximum heart rate was 103 bpm Minimum heart rate 50 bpm There were frequent PVCs with multiple runs of couplets, triplets, and up to quadruplets of multiple foci No significant pauses or agonal arrhythmias  LEFT HEART CATHETERIZATION AND CORONARY ANGIOGRAPHY performed on 03/17/2012 Mild (20-30%) nonobstructive CAD LVEDP not measured No pulmonary hypertension No valvular abnormalities  TRANSTHORACIC ECHOCARDIOGRAM performed on 03/13/2012 LVEF 45-50% No evidence of LVH Impaired left ventricular relaxation Mild left atrial dilatation Mild to moderate mitral and aortic valve insufficiency No evidence of pericardial effusion  CARDIOLITE MYOCARDIAL PERFUSION STUDY performed on 03/09/2012 LVEF decreased at 30% Mild peri-infarct ischemia in the apical segment  Impression and Plan:  Maycen Degregory has been referred for pre-anesthesia review and clearance prior to him undergoing the planned anesthetic and procedural courses. Available labs, pertinent testing, and imaging results were personally reviewed by me. This patient has been appropriately cleared by cardiology with an overall LOW risk of significant perioperative cardiovascular complications.  Based on clinical review performed today (05/25/21), barring any significant acute changes in the patient's overall condition, it is anticipated that he will be able to proceed with the planned surgical intervention. Any acute changes in clinical condition may necessitate his procedure being postponed and/or cancelled. Patient will meet with anesthesia team (MD and/or CRNA) on the day of his procedure for preoperative evaluation/assessment. Questions regarding anesthetic course will be fielded at that time.   Pre-surgical instructions were reviewed with the patient during his  PAT appointment and questions were fielded by PAT clinical staff. Patient was advised that if any questions or concerns arise prior to his procedure then he should return a call to PAT and/or his surgeon's office to discuss.  Honor Loh, MSN, APRN, FNP-C, CEN [facility]  Peri-operative Services Nurse Practitioner Phone: [phone] 05/25/21 3:22 PM  NOTE: This note has been prepared using Dragon dictation software. Despite my best ability to proofread, there is always the potential that unintentional transcriptional errors may still occur from this process.

## 2021-05-29 ENCOUNTER — Other Ambulatory Visit: Payer: Self-pay

## 2021-05-29 ENCOUNTER — Encounter
Admission: RE | Disposition: A | Discharge status: Home / Self Care | Payer: Self-pay | Source: Home / Self Care | Attending: Surgery

## 2021-05-29 ENCOUNTER — Ambulatory Visit: Payer: Medicare HMO | Admitting: Urgent Care

## 2021-05-29 ENCOUNTER — Ambulatory Visit
Admission: RE | Admit: 2021-05-29 | Discharge: 2021-05-29 | Disposition: A | Discharge status: Home / Self Care | Payer: Medicare HMO | Attending: Surgery | Admitting: Surgery

## 2021-05-29 ENCOUNTER — Encounter: Payer: Self-pay | Admitting: Surgery

## 2021-05-29 DIAGNOSIS — K819 Cholecystitis, unspecified: Secondary | ICD-10-CM | POA: Diagnosis not present

## 2021-05-29 DIAGNOSIS — K811 Chronic cholecystitis: Secondary | ICD-10-CM

## 2021-05-29 DIAGNOSIS — K8012 Calculus of gallbladder with acute and chronic cholecystitis without obstruction: Secondary | ICD-10-CM | POA: Diagnosis not present

## 2021-05-29 DIAGNOSIS — Z79899 Other long term (current) drug therapy: Secondary | ICD-10-CM | POA: Diagnosis not present

## 2021-05-29 HISTORY — DX: Shortness of breath: R06.02

## 2021-05-29 HISTORY — DX: Cardiac murmur, unspecified: R01.1

## 2021-05-29 HISTORY — DX: Paroxysmal atrial fibrillation: I48.0

## 2021-05-29 SURGERY — CHOLECYSTECTOMY, ROBOT-ASSISTED, LAPAROSCOPIC
Anesthesia: General | Site: Abdomen

## 2021-05-29 SURGERY — Surgical Case
Anesthesia: *Unknown

## 2021-05-29 MED ORDER — INDOCYANINE GREEN 25 MG IV SOLR
1.2500 mg | Freq: Once | INTRAVENOUS | Status: AC
  Administered 2021-05-29: 1.25 mg via INTRAVENOUS
  Filled 2021-05-29: qty 0.5

## 2021-05-29 MED ORDER — CHLORHEXIDINE GLUCONATE 0.12 % MT SOLN
15.0000 mL | Freq: Once | OROMUCOSAL | Status: AC
  Administered 2021-05-29: 15 mL via OROMUCOSAL

## 2021-05-29 MED ORDER — HYDROCODONE-ACETAMINOPHEN 5-325 MG PO TABS: 1.0000 | ORAL_TABLET | Freq: Four times a day (QID) | ORAL | 0 refills | Status: DC | PRN

## 2021-05-29 MED ORDER — BUPIVACAINE-EPINEPHRINE (PF) 0.5% -1:200000 IJ SOLN
INTRAMUSCULAR | Status: AC
  Filled 2021-05-29: qty 30

## 2021-05-29 MED ORDER — HYDROCODONE-ACETAMINOPHEN 5-325 MG PO TABS: 1.0000 | ORAL_TABLET | Freq: Once | ORAL | Status: AC

## 2021-05-29 MED ORDER — PROPOFOL 10 MG/ML IV BOLUS
INTRAVENOUS | Status: DC | PRN
  Administered 2021-05-29: 150 mg via INTRAVENOUS

## 2021-05-29 MED ORDER — FENTANYL CITRATE (PF) 100 MCG/2ML IJ SOLN
INTRAMUSCULAR | Status: AC
  Administered 2021-05-29: 25 ug via INTRAVENOUS
  Filled 2021-05-29: qty 2

## 2021-05-29 MED ORDER — ACETAMINOPHEN 10 MG/ML IV SOLN
INTRAVENOUS | Status: AC
  Filled 2021-05-29: qty 100

## 2021-05-29 MED ORDER — SODIUM CHLORIDE 0.9 % IR SOLN
Status: DC | PRN
  Administered 2021-05-29: 250 mL
  Administered 2021-05-29: 500 mL

## 2021-05-29 MED ORDER — CEFAZOLIN SODIUM-DEXTROSE 2-4 GM/100ML-% IV SOLN
INTRAVENOUS | Status: AC
  Filled 2021-05-29: qty 100

## 2021-05-29 MED ORDER — DOCUSATE SODIUM 100 MG PO CAPS: 100.0000 mg | ORAL_CAPSULE | Freq: Two times a day (BID) | ORAL | 0 refills | Status: AC | PRN

## 2021-05-29 MED ORDER — LIDOCAINE-EPINEPHRINE (PF) 1 %-1:200000 IJ SOLN
INTRAMUSCULAR | Status: DC | PRN
  Administered 2021-05-29: 20 mL

## 2021-05-29 MED ORDER — FENTANYL CITRATE (PF) 100 MCG/2ML IJ SOLN
INTRAMUSCULAR | Status: DC | PRN
  Administered 2021-05-29 (×2): 50 ug via INTRAVENOUS

## 2021-05-29 MED ORDER — FAMOTIDINE 20 MG PO TABS
ORAL_TABLET | ORAL | Status: AC
  Administered 2021-05-29: 20 mg
  Filled 2021-05-29: qty 1

## 2021-05-29 MED ORDER — LIDOCAINE HCL (PF) 1 % IJ SOLN
INTRAMUSCULAR | Status: AC
  Filled 2021-05-29: qty 30

## 2021-05-29 MED ORDER — ACETAMINOPHEN 10 MG/ML IV SOLN
INTRAVENOUS | Status: DC | PRN
  Administered 2021-05-29: 1000 mg via INTRAVENOUS

## 2021-05-29 MED ORDER — IBUPROFEN 800 MG PO TABS: 800.0000 mg | ORAL_TABLET | Freq: Three times a day (TID) | ORAL | 0 refills | Status: DC | PRN

## 2021-05-29 MED ORDER — ORAL CARE MOUTH RINSE: 15.0000 mL | Freq: Once | OROMUCOSAL | Status: AC

## 2021-05-29 MED ORDER — ACETAMINOPHEN 325 MG PO TABS: 650.0000 mg | ORAL_TABLET | Freq: Three times a day (TID) | ORAL | 0 refills | Status: AC | PRN

## 2021-05-29 MED ORDER — ROCURONIUM BROMIDE 100 MG/10ML IV SOLN
INTRAVENOUS | Status: DC | PRN
  Administered 2021-05-29: 10 mg via INTRAVENOUS
  Administered 2021-05-29: 50 mg via INTRAVENOUS

## 2021-05-29 MED ORDER — CEFAZOLIN SODIUM-DEXTROSE 2-4 GM/100ML-% IV SOLN
2.0000 g | INTRAVENOUS | Status: AC
  Administered 2021-05-29: 2 g via INTRAVENOUS

## 2021-05-29 MED ORDER — FENTANYL CITRATE (PF) 100 MCG/2ML IJ SOLN
INTRAMUSCULAR | Status: AC
  Filled 2021-05-29: qty 2

## 2021-05-29 MED ORDER — LIDOCAINE HCL (CARDIAC) PF 100 MG/5ML IV SOSY
PREFILLED_SYRINGE | INTRAVENOUS | Status: DC | PRN
  Administered 2021-05-29: 80 mg via INTRAVENOUS

## 2021-05-29 MED ORDER — ONDANSETRON HCL 4 MG/2ML IJ SOLN: 4.0000 mg | Freq: Once | INTRAMUSCULAR | Status: DC | PRN

## 2021-05-29 MED ORDER — PROPOFOL 10 MG/ML IV BOLUS
INTRAVENOUS | Status: AC
  Filled 2021-05-29: qty 40

## 2021-05-29 MED ORDER — SEVOFLURANE IN SOLN
RESPIRATORY_TRACT | Status: AC
  Filled 2021-05-29: qty 250

## 2021-05-29 MED ORDER — HEMOSTATIC AGENTS (NO CHARGE) OPTIME
TOPICAL | Status: DC | PRN
  Administered 2021-05-29: 1 via TOPICAL

## 2021-05-29 MED ORDER — FENTANYL CITRATE (PF) 100 MCG/2ML IJ SOLN
25.0000 ug | INTRAMUSCULAR | Status: DC | PRN
  Administered 2021-05-29: 25 ug via INTRAVENOUS

## 2021-05-29 MED ORDER — MEPERIDINE HCL 25 MG/ML IJ SOLN
6.2500 mg | INTRAMUSCULAR | Status: DC | PRN
Start: 2021-05-29 — End: 2021-05-29

## 2021-05-29 MED ORDER — CHLORHEXIDINE GLUCONATE CLOTH 2 % EX PADS: 6.0000 | MEDICATED_PAD | Freq: Once | CUTANEOUS | Status: DC

## 2021-05-29 MED ORDER — ONDANSETRON HCL 4 MG/2ML IJ SOLN
INTRAMUSCULAR | Status: DC | PRN
  Administered 2021-05-29: 4 mg via INTRAVENOUS

## 2021-05-29 MED ORDER — METOPROLOL SUCCINATE ER 25 MG PO TB24
12.5000 mg | ORAL_TABLET | Freq: Every day | ORAL | Status: DC
Start: 2021-05-29 — End: 2021-12-23

## 2021-05-29 MED ORDER — DEXAMETHASONE SODIUM PHOSPHATE 10 MG/ML IJ SOLN
INTRAMUSCULAR | Status: DC | PRN
  Administered 2021-05-29: 10 mg via INTRAVENOUS

## 2021-05-29 MED ORDER — CHLORHEXIDINE GLUCONATE 0.12 % MT SOLN
OROMUCOSAL | Status: AC
  Filled 2021-05-29: qty 15

## 2021-05-29 MED ORDER — EPHEDRINE SULFATE 50 MG/ML IJ SOLN
INTRAMUSCULAR | Status: DC | PRN
  Administered 2021-05-29: 10 mg via INTRAVENOUS

## 2021-05-29 MED ORDER — LACTATED RINGERS IV SOLN: INTRAVENOUS | Status: DC

## 2021-05-29 MED ORDER — SUGAMMADEX SODIUM 200 MG/2ML IV SOLN
INTRAVENOUS | Status: DC | PRN
  Administered 2021-05-29: 200 mg via INTRAVENOUS

## 2021-05-29 MED ORDER — HYDROCODONE-ACETAMINOPHEN 5-325 MG PO TABS
ORAL_TABLET | ORAL | Status: AC
  Administered 2021-05-29: 1 via ORAL
  Filled 2021-05-29: qty 1

## 2021-05-29 MED ORDER — EPHEDRINE 5 MG/ML INJ
INTRAVENOUS | Status: AC
  Filled 2021-05-29: qty 10

## 2021-05-29 SURGICAL SUPPLY — 61 items
ADH SKN CLS APL DERMABOND .7 (GAUZE/BANDAGES/DRESSINGS) ×1
ANCHOR TIS RET SYS 235ML (MISCELLANEOUS) ×2 IMPLANT
APL PRP STRL LF DISP 70% ISPRP (MISCELLANEOUS) ×1
APL SRG 38 LTWT LNG FL B (MISCELLANEOUS) ×1
APPLICATOR ARISTA FLEXITIP XL (MISCELLANEOUS) ×2 IMPLANT
BAG INFUSER PRESSURE 100CC (MISCELLANEOUS) ×2 IMPLANT
BAG TISS RTRVL C235 10X14 (MISCELLANEOUS) ×1
BLADE SURG SZ11 CARB STEEL (BLADE) ×2 IMPLANT
BULB RESERV EVAC DRAIN JP 100C (MISCELLANEOUS) ×2 IMPLANT
CANISTER SUCT 1200ML W/VALVE (MISCELLANEOUS) ×2 IMPLANT
CANNULA REDUC XI 12-8 STAPL (CANNULA) ×1
CANNULA REDUCER 12-8 DVNC XI (CANNULA) ×1 IMPLANT
CHLORAPREP W/TINT 26 (MISCELLANEOUS) ×2 IMPLANT
CLIP VESOLOCK LG 6/CT PURPLE (CLIP) ×2 IMPLANT
CLIP VESOLOCK MED LG 6/CT (CLIP) ×2 IMPLANT
COVER TIP SHEARS 8 DVNC (MISCELLANEOUS) ×1 IMPLANT
COVER TIP SHEARS 8MM DA VINCI (MISCELLANEOUS) ×1
COVER WAND RF STERILE (DRAPES) ×2 IMPLANT
DECANTER SPIKE VIAL GLASS SM (MISCELLANEOUS) ×4 IMPLANT
DEFOGGER SCOPE WARMER CLEARIFY (MISCELLANEOUS) ×2 IMPLANT
DERMABOND ADVANCED (GAUZE/BANDAGES/DRESSINGS) ×1
DERMABOND ADVANCED .7 DNX12 (GAUZE/BANDAGES/DRESSINGS) ×1 IMPLANT
DRAIN CHANNEL JP 15F RND 16 (MISCELLANEOUS) ×2 IMPLANT
DRAPE ARM DVNC X/XI (DISPOSABLE) ×4 IMPLANT
DRAPE COLUMN DVNC XI (DISPOSABLE) ×1 IMPLANT
DRAPE DA VINCI XI ARM (DISPOSABLE) ×8
DRAPE DA VINCI XI COLUMN (DISPOSABLE) ×2
ELECT CAUTERY BLADE 6.4 (BLADE) ×2 IMPLANT
ELECT REM PT RETURN 9FT ADLT (ELECTROSURGICAL) ×2
ELECTRODE REM PT RTRN 9FT ADLT (ELECTROSURGICAL) ×1 IMPLANT
GLOVE SURG SYN 6.5 ES PF (GLOVE) ×4 IMPLANT
GLOVE SURG UNDER POLY LF SZ7 (GLOVE) ×4 IMPLANT
GOWN STRL REUS W/ TWL LRG LVL3 (GOWN DISPOSABLE) ×3 IMPLANT
GOWN STRL REUS W/TWL LRG LVL3 (GOWN DISPOSABLE) ×6
HEMOSTAT ARISTA ABSORB 1G (HEMOSTASIS) ×2 IMPLANT
IRRIGATOR SUCT 8 DISP DVNC XI (IRRIGATION / IRRIGATOR) ×1 IMPLANT
IRRIGATOR SUCTION 8MM XI DISP (IRRIGATION / IRRIGATOR) ×1
IV NS 1000ML (IV SOLUTION) ×2
IV NS 1000ML BAXH (IV SOLUTION) ×1 IMPLANT
LABEL OR SOLS (LABEL) ×2 IMPLANT
MANIFOLD NEPTUNE II (INSTRUMENTS) ×2 IMPLANT
NEEDLE HYPO 22GX1.5 SAFETY (NEEDLE) ×2 IMPLANT
NEEDLE INSUFFLATION 14GA 120MM (NEEDLE) ×2 IMPLANT
NS IRRIG 500ML POUR BTL (IV SOLUTION) ×2 IMPLANT
OBTURATOR OPTICAL STANDARD 8MM (TROCAR) ×2
OBTURATOR OPTICAL STND 8 DVNC (TROCAR) ×1
OBTURATOR OPTICALSTD 8 DVNC (TROCAR) ×1 IMPLANT
PACK LAP CHOLECYSTECTOMY (MISCELLANEOUS) ×2 IMPLANT
PENCIL ELECTRO HAND CTR (MISCELLANEOUS) ×2 IMPLANT
SEAL CANN UNIV 5-8 DVNC XI (MISCELLANEOUS) ×3 IMPLANT
SEAL XI 5MM-8MM UNIVERSAL (MISCELLANEOUS) ×3
SET TUBE SMOKE EVAC HIGH FLOW (TUBING) ×2 IMPLANT
SOLUTION ELECTROLUBE (MISCELLANEOUS) ×2 IMPLANT
STAPLER CANNULA SEAL DVNC XI (STAPLE) ×1 IMPLANT
STAPLER CANNULA SEAL XI (STAPLE) ×2
SUT ETHILON 3-0 FS-10 30 BLK (SUTURE) ×2
SUT MNCRL 4-0 (SUTURE) ×4
SUT MNCRL 4-0 27XMFL (SUTURE) ×2
SUT VICRYL 0 AB UR-6 (SUTURE) ×2 IMPLANT
SUTURE EHLN 3-0 FS-10 30 BLK (SUTURE) ×1 IMPLANT
SUTURE MNCRL 4-0 27XMF (SUTURE) ×2 IMPLANT

## 2021-05-29 NOTE — Anesthesia Procedure Notes (Addendum)
Procedure Name: Intubation Date/Time: 05/29/2021 7:42 AM Performed by: Phill Mutter, MD Pre-anesthesia Checklist: Patient identified, Patient being monitored, Timeout performed, Emergency Drugs available and Suction available Patient Re-evaluated:Patient Re-evaluated prior to induction Oxygen Delivery Method: Circle system utilized Preoxygenation: Pre-oxygenation with 100% oxygen Induction Type: IV induction Ventilation: Mask ventilation without difficulty Laryngoscope Size: 3 and McGraph Grade View: Grade I Tube type: Oral Tube size: 7.5 mm Number of attempts: 1 Airway Equipment and Method: Stylet Placement Confirmation: ETT inserted through vocal cords under direct vision, positive ETCO2 and breath sounds checked- equal and bilateral Secured at: 22 cm Tube secured with: Tape Dental Injury: Teeth and Oropharynx as per pre-operative assessment

## 2021-05-29 NOTE — Anesthesia Postprocedure Evaluation (Signed)
Anesthesia Post Note  Patient: [name]  Procedure(s) Performed: XI ROBOTIC ASSISTED LAPAROSCOPIC CHOLECYSTECTOMY (Abdomen)  Patient location during evaluation: PACU Anesthesia Type: General Level of consciousness: awake and alert and sedated Pain management: pain level controlled Vital Signs Assessment: post-procedure vital signs reviewed and stable Respiratory status: spontaneous breathing, nonlabored ventilation, respiratory function stable and patient connected to nasal cannula oxygen Cardiovascular status: blood pressure returned to baseline and stable Postop Assessment: no apparent nausea or vomiting Anesthetic complications: no   No notable events documented.   Last Vitals:  Vitals:   05/29/21 0624  BP: (!) 141/77  Pulse: (!) 53  Resp: 20  Temp: 36.6 C  SpO2: 98%    Last Pain:  Vitals:   05/29/21 0624  TempSrc: Oral  PainSc: 0-No pain                 Adanely Reynoso

## 2021-05-29 NOTE — Interval H&P Note (Signed)
History and Physical Interval Note:  05/29/2021 7:12 AM  [name]  has presented today for surgery, with the diagnosis of cholecystitis.  The various methods of treatment have been discussed with the patient and family. After consideration of risks, benefits and other options for treatment, the patient has consented to  Procedure(s): XI ROBOTIC [place] (N/A) as a surgical intervention.  The patient's history has been reviewed, patient examined, no change in status, stable for surgery.  I have reviewed the patient's chart and labs.  Questions were answered to the patient's satisfaction.     Cruzita Lipa Lysle Pearl

## 2021-05-29 NOTE — Anesthesia Postprocedure Evaluation (Signed)
Anesthesia Post Note  Patient: [name]  Procedure(s) Performed: XI ROBOTIC ASSISTED LAPAROSCOPIC CHOLECYSTECTOMY (Abdomen)  Patient location during evaluation: PACU Anesthesia Type: General Level of consciousness: awake and alert, awake and oriented Pain management: pain level controlled Vital Signs Assessment: post-procedure vital signs reviewed and stable Respiratory status: spontaneous breathing, nonlabored ventilation and respiratory function stable Cardiovascular status: blood pressure returned to baseline and stable Postop Assessment: no apparent nausea or vomiting Anesthetic complications: no   No notable events documented.   Last Vitals:  Vitals:   05/29/21 1030 05/29/21 1041  BP: (!) 148/63   Pulse: (!) 59 61  Resp: 16 18  Temp:  36.6 C  SpO2: 100% 97%    Last Pain:  Vitals:   05/29/21 1041  TempSrc:   PainSc: 3                  Phill Mutter

## 2021-05-29 NOTE — Discharge Instructions (Addendum)
Laparoscopic Cholecystectomy, Care After This sheet gives you information about how to care for yourself after your procedure. Your doctor may also give you more specific instructions. If you have problems or questions, contact your doctor. Follow these instructions at home: Care for cuts from surgery (incisions)  Follow instructions from your doctor about how to take care of your cuts from surgery. Make sure you: Wash your hands with soap and water before you change your bandage (dressing). If you cannot use soap and water, use hand sanitizer. Change your bandage as told by your doctor. Leave stitches (sutures), skin glue, or skin tape (adhesive) strips in place. They may need to stay in place for 2 weeks or longer. If tape strips get loose and curl up, you may trim the loose edges. Do not remove tape strips completely unless your doctor says it is okay. MEASURE OUTPUT AND DRAIN COLOR EVERY DAY.  CALL OFFICE WITH ANY CHANGE IN COLOR OF DRAIN OUTPUT  OK TO SHOWER 24HRS AFTER YOUR SURGERY. KEEP DRAIN AREA COVERED WHILE SHOWERING.  OK TO CHANGE DRESSING AROUND DRAIN AFTERWARDS AND CHANGE DAILY Check your surgical cut area every day for signs of infection. Check for: More redness, swelling, or pain. More fluid or blood. Warmth. Pus or a bad smell. Activity Do not drive or use heavy machinery while taking prescription pain medicine. Do not play contact sports until your doctor says it is okay. Do not drive for 24 hours if you were given a medicine to help you relax (sedative). Rest as needed. Do not return to work or school until your doctor says it is okay. General instructions   tylenol and advil as needed for discomfort.  Please alternate between the two every four hours as needed for pain.    Use narcotics, if prescribed, only when tylenol and motrin is not enough to control pain.  325-650mg  every 8hrs to max of 3000mg /24hrs (including the 325mg  in every norco dose) for the tylenol.     Advil up to 800mg  per dose every 8hrs as needed for pain.   To prevent or treat constipation while you are taking prescription pain medicine, your doctor may recommend that you: Drink enough fluid to keep your pee (urine) clear or pale yellow. Take over-the-counter or prescription medicines. Eat foods that are high in fiber, such as fresh fruits and vegetables, whole grains, and beans. Limit foods that are high in fat and processed sugars, such as fried and sweet foods. Contact a doctor if: You develop a rash. You have more redness, swelling, or pain around your surgical cuts. You have more fluid or blood coming from your surgical cuts. Your surgical cuts feel warm to the touch. You have pus or a bad smell coming from your surgical cuts. You have a fever. One or more of your surgical cuts breaks open. Get help right away if: You have trouble breathing. You have chest pain. You have pain that is getting worse in your shoulders. You faint or feel dizzy when you stand. You have very bad pain in your belly (abdomen). You are sick to your stomach (nauseous) for more than one day.    JP Drain [employer] this sheet to all of your post-operative appointments while you have your drains. Please measure your drains by CC's or ML's. Make sure you drain and measure your JP Drains 3 times per day. At the end of each day, add up totals for the left side and add up totals for the right  side.    ( 9 am )     ( 3 pm )        ( 9 pm )                Date L  R  L  R  L  R  Total L/R                                                                                                                                                                                       You have throwing up (vomiting) that lasts for more than one day. You have leg pain. This information is not intended to replace advice given to you by your health care provider. Make sure you discuss any questions you have with your  health care provider. Document Released: 09/07/2008 Document Revised: 06/19/2016 Document Reviewed: 05/17/2016 Elsevier Interactive Patient Education  2019 [place]   The drugs that you were given will stay in your system until tomorrow so for the next 24 hours you should not:  Drive an automobile Make any legal decisions Drink any alcoholic beverage   You may resume regular meals tomorrow.  Today it is better to start with liquids and gradually work up to solid foods.  You may eat anything you prefer, but it is better to start with liquids, then soup and crackers, and gradually work up to solid foods.   Please notify your doctor immediately if you have any unusual bleeding, trouble breathing, redness and pain at the surgery site, drainage, fever, or pain not relieved by medication.    Additional Instructions:        Please contact your physician with any problems or Same Day Surgery at [phone], Monday through Friday 6 am to 4 pm, or [place] [facility] number at [phone].

## 2021-05-29 NOTE — Op Note (Signed)
Preoperative diagnosis:  chronic and cholecystitis  Postoperative diagnosis: same as above  Procedure: Robotic assisted Laparoscopic Cholecystectomy.   Anesthesia: GETA   Surgeon: Benjamine Sprague  Specimen: Gallbladder  Complications: None  EBL: 28mL  Wound Classification: Clean Contaminated  Indications: see HPI  Findings: Critical view of safety noted Cystic duct and artery identified, ligated and divided, clips remained intact at end of procedure Adequate hemostasis  Description of procedure:  The patient was placed on the operating table in the supine position. SCDs placed, pre-op abx administered.  General anesthesia was induced and OG tube placed by anesthesia. A time-out was completed verifying correct patient, procedure, site, positioning, and implant(s) and/or special equipment prior to beginning this procedure. The abdomen was prepped and draped in the usual sterile fashion.    Veress needle was placed at the Palmer's point and insufflation was started after confirming a positive saline drop test and no immediate increase in abdominal pressure.  After reaching 15 mm, the Veress needle was removed and a 8 mm port was placed via optiview technique under umbilicus measured 13KG from gallbladder.  The abdomen was inspected and no abnormalities or injuries were found.  Under direct vision, ports were placed in the following locations: One 12 mm patient left of the umbilicus, 8cm from the optiviewed port, one 8 mm port placed to the patient right of the umbilical port 8 cm apart.  1 additional 8 mm port placed lateral to the 67mm port.  Once ports were placed, The table was placed in the reverse Trendelenburg position with the right side up. The Xi platform was brought into the operative field and docked to the ports successfully.  An endoscope was placed through the umbilical port, fenestrated grasper through the adjacent patient right port, prograsp to the far patient left port, and  then a hook cautery in the left port.  The dome of the gallbladder was grasped with prograsp, passed and retracted over the dome of the liver. Adhesions between the gallbladder and omentum, duodenum and transverse colon were lysed via hook cautery. The infundibulum was grasped with the fenestrated grasper and retracted toward the right lower quadrant. This maneuver exposed the presumed area for Calot's triangle. The peritoneum overlying the gallbladder infundibulum was then carefully dissected using combination of blunt dissection and electrocautery hook. Small hole made within the infundibulum during dissection of the extensively inflammed and scarred area, with control of bleeding with bipolar fenestrated graspers and hook cautery.  CBD was clearly visible under ICG view, but no visible cystic duct was identified and the gallbladder never had ICG within it.  Dissection around the area eventually did not yield any obvious duct or artery, and the gallbladder dissection was completed off the fossa with  no additional areas of bleeding or bile leak.  Two large clips were then placed on the peritoneal stump that was near the area of the initial hole created in the infundibulum, well above the again visible CBD on ICG.  After extensive irrigation and application of arista on fossa bed with severy inflammed tissue. No additional bleeding or bile leak was noted.  15Fr blake drain placed through RLQ over area and secured with 3-0 nylon to skin.  Endo Catch bag was then placed through the 12 mm port and the gallbladder was removed.  The gallbladder was passed off the table as a specimen. The 12 mm port site closed with PMI using 0 vicryl under direct vision.  Abdomen desufflated and secondary trocars were removed under  direct vision. No bleeding was noted. All skin incisions then closed with subcuticular sutures of 4-0 monocryl and dressed with topical skin adhesive. The orogastric tube was removed and patient  extubated.  The patient tolerated the procedure well and was taken to the postanesthesia care unit in stable condition.  All sponge and instrument count correct at end of procedure.

## 2021-05-29 NOTE — Transfer of Care (Signed)
Immediate Anesthesia Transfer of Care Note  Patient: [name]  Procedure(s) Performed: XI ROBOTIC ASSISTED LAPAROSCOPIC CHOLECYSTECTOMY (Abdomen)  Patient Location: PACU  Anesthesia Type:General  Level of Consciousness: awake and sedated  Airway & Oxygen Therapy: Patient connected to face mask oxygen  Post-op Assessment: Report given to RN  Post vital signs: stable  Last Vitals:  Vitals Value Taken Time  BP 164/71 05/29/21 0948  Temp    Pulse 57 05/29/21 0952  Resp 18 05/29/21 0952  SpO2 99 % 05/29/21 0952  Vitals shown include unvalidated device data.  Last Pain:  Vitals:   05/29/21 0624  TempSrc: Oral  PainSc: 0-No pain         Complications: No notable events documented.

## 2021-05-29 NOTE — Anesthesia Preprocedure Evaluation (Signed)
Anesthesia Evaluation  Patient identified by MRN, date of birth, ID band Patient awake    Reviewed: Allergy & Precautions, H&P , NPO status , Patient's Chart, lab work & pertinent test results, reviewed documented beta blocker date and time   History of Anesthesia Complications (+) PONV and history of anesthetic complications  Airway Mallampati: II  TM Distance: >3 FB Neck ROM: full    Dental  (+) Teeth Intact   Pulmonary sleep apnea and Continuous Positive Airway Pressure Ventilation ,    Pulmonary exam normal        Cardiovascular Exercise Tolerance: Good hypertension, On Medications Normal cardiovascular exam+ Valvular Problems/Murmurs  Rhythm:regular Rate:Normal     Neuro/Psych PSYCHIATRIC DISORDERS Anxiety Depression negative neurological ROS     GI/Hepatic negative GI ROS, Neg liver ROS,   Endo/Other  negative endocrine ROS  Renal/GU Renal disease  negative genitourinary   Musculoskeletal  (+) Arthritis ,   Abdominal   Peds  Hematology  (+) Blood dyscrasia, anemia ,   Anesthesia Other Findings Actinic keratosis    BPH with urinary obstruction  sees urology Dr. [name]  Carotid stenosis 07/20/2016 Minimal on [place] 07/2016. Monitor clinically.  Complication of anesthesia  HALLUCINATED AFTER SURGERY  Depression with anxiety    Elevated PSA 2014 sees urology - normal biopsies x 3  History of kidney stones    HLD (hyperlipidemia)    Left kidney mass 09/2014 on CT scan thought consistent with simple cyst on prior US  Nasal obstruction  congenital R sided  Obstructive pyelonephritis 09/2014 s/p hospitalization and treatment with levaquin  OSA (obstructive sleep apnea)  pt denies  Osteoporosis 06/04/2018 DEXA T score -37 at [facility]  Recurrent kidney stones 2014 extensive bilateral nephrolithiasis, sees urology [name]), hydrocodone prn  Squamous cell carcinoma of skin 07/25/2019 [facility]  Squamous cell carcinoma  of skin 06/29/2017 SCC IS ant to [facility] post  Symptomatic PVCs  per prior cardiologist, on beta blocker  Urge incontinence  thought overactive bladder     Reproductive/Obstetrics negative OB ROS                             Anesthesia Physical  Anesthesia Plan  ASA: 2  Anesthesia Plan: General ETT and General   Post-op Pain Management:    Induction: Intravenous  PONV Risk Score and Plan: Ondansetron and Midazolam  Airway Management Planned: Oral ETT  Additional Equipment:   Intra-op Plan:   Post-operative Plan: Extubation in OR  Informed Consent: I have reviewed the patients History and Physical, chart, labs and discussed the procedure including the risks, benefits and alternatives for the proposed anesthesia with the patient or authorized representative who has indicated his/her understanding and acceptance.     Dental Advisory Given  Plan Discussed with: CRNA, Anesthesiologist and Surgeon  Anesthesia Plan Comments:        Anesthesia Quick Evaluation

## 2021-05-29 NOTE — OR Nursing (Signed)
2 IV CATHS RIGHT LOWER ARM/WRIST - ONLY ONE DOCUMENTED IN Epic, BOTH D/C'D POSTOP; GAUZE/PAPER TAPE APPLIED.

## 2021-06-01 LAB — SURGICAL PATHOLOGY

## 2021-07-06 ENCOUNTER — Encounter: Payer: Self-pay | Admitting: Dermatology

## 2021-07-06 ENCOUNTER — Ambulatory Visit: Payer: Medicare HMO | Admitting: Dermatology

## 2021-07-06 ENCOUNTER — Other Ambulatory Visit: Payer: Self-pay

## 2021-07-06 DIAGNOSIS — D229 Melanocytic nevi, unspecified: Secondary | ICD-10-CM

## 2021-07-06 DIAGNOSIS — Z1283 Encounter for screening for malignant neoplasm of skin: Secondary | ICD-10-CM | POA: Diagnosis not present

## 2021-07-06 DIAGNOSIS — L821 Other seborrheic keratosis: Secondary | ICD-10-CM | POA: Diagnosis not present

## 2021-07-06 DIAGNOSIS — D692 Other nonthrombocytopenic purpura: Secondary | ICD-10-CM

## 2021-07-06 DIAGNOSIS — L570 Actinic keratosis: Secondary | ICD-10-CM

## 2021-07-06 DIAGNOSIS — L814 Other melanin hyperpigmentation: Secondary | ICD-10-CM | POA: Diagnosis not present

## 2021-07-06 DIAGNOSIS — L820 Inflamed seborrheic keratosis: Secondary | ICD-10-CM | POA: Diagnosis not present

## 2021-07-06 DIAGNOSIS — L578 Other skin changes due to chronic exposure to nonionizing radiation: Secondary | ICD-10-CM

## 2021-07-06 DIAGNOSIS — D1800 Hemangioma unspecified site: Secondary | ICD-10-CM

## 2021-07-06 NOTE — Patient Instructions (Addendum)
Levulan/PDT Treatment Common Side Effects Scalp   - Burning/stinging, which may be severe and last up to 24-72 hours after your treatment  - Redness, swelling and/or peeling which may last up to 4 weeks  - Scaling/crusting which may last up to 2 weeks  - Sun sensitivity (you MUST avoid sun exposure for 48-72 hours after treatment)  Care Instructions  - Okay to wash with soap and water and shampoo as normal  - If needed, you can do a cold compress (ex. Ice packs) for comfort  - If okay with your Primary Doctor, you may use analgesics such as Tylenol every 4-6 hours, not to exceed recommended dose  - You may apply Cerave Healing Ointment, Vaseline or Aquaphor  - If you have a lot of swelling you may take a Benadryl to help with this (this may cause drowsiness)  Sun Precautions  - Wear a wide brim hat for the next week if outside  - Wear a sunblock with zinc or titanium dioxide at least SPF 50 daily   We will recheck you in 10-12 weeks. If any problems, please call the office and ask to speak with a nurse.     Cryotherapy Aftercare  Wash gently with soap and water everyday.   Apply Vaseline and Band-Aid daily until healed.     If you have any questions or concerns for your doctor, please call our main line at [phone] and press option 4 to reach your doctor's medical assistant. If no one answers, please leave a voicemail as directed and we will return your call as soon as possible. Messages left after 4 pm will be answered the following business day.   You may also send [place] a message via [place]. We typically respond to MyChart messages within 1-2 business days.  For prescription refills, please ask your pharmacy to contact our office. Our fax number is [phone].  If you have an urgent issue when the clinic is closed that cannot wait until the next business day, you can page your doctor at the number below.    Please note that while we do our best to be available  for urgent issues outside of office hours, we are not available 24/7.   If you have an urgent issue and are unable to reach [place], you may choose to seek medical care at your doctor's office, retail clinic, urgent care center, or emergency room.  If you have a medical emergency, please immediately call 911 or go to the emergency department.  Pager Numbers  - Dr. Nehemiah Massed: [phone]  - Dr. Laurence Ferrari: [phone]  - Dr. Nicole Kindred: [phone]  In the event of inclement weather, please call our main line at [phone] for an update on the status of any delays or closures.  Dermatology Medication Tips: Please keep the boxes that topical medications come in in order to help keep track of the instructions about where and how to use these. Pharmacies typically print the medication instructions only on the boxes and not directly on the medication tubes.   If your medication is too expensive, please contact our office at [phone] option 4 or send [place] a message through [place].   We are unable to tell what your co-pay for medications will be in advance as this is different depending on your insurance coverage. However, we may be able to find a substitute medication at lower cost or fill out paperwork to get insurance to cover a needed medication.   If a prior authorization is  required to get your medication covered by your insurance company, please allow [place] 1-2 business days to complete this process.  Drug prices often vary depending on where the prescription is filled and some pharmacies may offer cheaper prices.  The website www.goodrx.com contains coupons for medications through different pharmacies. The prices here do not account for what the cost may be with help from insurance (it may be cheaper with your insurance), but the website can give you the price if you did not use any insurance.  - You can print the associated coupon and take it with your prescription to the pharmacy.  - You may  also stop by our office during regular business hours and pick up a GoodRx coupon card.  - If you need your prescription sent electronically to a different pharmacy, notify our office through [facility] or by phone at [phone] option 4.

## 2021-07-06 NOTE — Progress Notes (Signed)
Follow-Up Visit   Subjective  [name] is a 80 y.o. male who presents for the following: Annual Exam. Hx of SCC  The patient presents for Upper Body Skin Exam (UBSE) for skin cancer screening and mole check.   The following portions of the chart were reviewed this encounter and updated as appropriate:   Tobacco  Allergies  Meds  Problems  Med Hx  Surg Hx  Fam Hx     Review of Systems:  No other skin or systemic complaints except as noted in HPI or Assessment and Plan.  Objective  Well appearing patient in no apparent distress; mood and affect are within normal limits.  All skin waist up examined.  Scalp (17) Erythematous thin papules/macules with gritty scale.   right zygoma, right upper back (2) Erythematous keratotic or waxy stuck-on papule or plaque.    Assessment & Plan  AK (actinic keratosis) (17) Scalp  Destruction of lesion - Scalp Complexity: simple   Destruction method: cryotherapy   Informed consent: discussed and consent obtained   Timeout:  patient name, date of birth, surgical site, and procedure verified Lesion destroyed using liquid nitrogen: Yes   Region frozen until ice ball extended beyond lesion: Yes   Outcome: patient tolerated procedure well with no complications   Post-procedure details: wound care instructions given    Inflamed seborrheic keratosis right zygoma, right upper back  Destruction of lesion - right zygoma, right upper back Complexity: simple   Destruction method: cryotherapy   Informed consent: discussed and consent obtained   Timeout:  patient name, date of birth, surgical site, and procedure verified Lesion destroyed using liquid nitrogen: Yes   Region frozen until ice ball extended beyond lesion: Yes   Outcome: patient tolerated procedure well with no complications   Post-procedure details: wound care instructions given    Lentigines - Scattered tan macules - Due to sun exposure - Benign-appering, observe -  Recommend daily broad spectrum sunscreen SPF 30+ to sun-exposed areas, reapply every 2 hours as needed. - Call for any changes  Seborrheic Keratoses - Stuck-on, waxy, tan-brown papules and/or plaques  - Benign-appearing - Discussed benign etiology and prognosis. - Observe - Call for any changes  Melanocytic Nevi - Tan-brown and/or pink-flesh-colored symmetric macules and papules - Benign appearing on exam today - Observation - Call clinic for new or changing moles - Recommend daily use of broad spectrum spf 30+ sunscreen to sun-exposed areas.   Hemangiomas - Red papules - Discussed benign nature - Observe - Call for any changes  Actinic Damage - Severe, confluent actinic changes with pre-cancerous actinic keratoses  - Severe, chronic, not at goal, secondary to cumulative UV radiation exposure over time - diffuse scaly erythematous macules and papules with underlying dyspigmentation - Discussed Prescription "Field Treatment" for Severe, Chronic Confluent Actinic Changes with Pre-Cancerous Actinic Keratoses PDT on the scalp in 4 weeks  Field treatment involves treatment of an entire area of skin that has confluent Actinic Changes (Sun/ Ultraviolet light damage) and PreCancerous Actinic Keratoses by method of PhotoDynamic Therapy (PDT) and/or prescription Topical Chemotherapy agents such as 5-fluorouracil, 5-fluorouracil/calcipotriene, and/or imiquimod.  The purpose is to decrease the number of clinically evident and subclinical PreCancerous lesions to prevent progression to development of skin cancer by chemically destroying early precancer changes that may or may not be visible.  It has been shown to reduce the risk of developing skin cancer in the treated area. As a result of treatment, redness, scaling, crusting, and open sores  may occur during treatment course. One or more than one of these methods may be used and may have to be used several times to control, suppress and eliminate the  PreCancerous changes. Discussed treatment course, expected reaction, and possible side effects. - Recommend daily broad spectrum sunscreen SPF 30+ to sun-exposed areas, reapply every 2 hours as needed.  - Staying in the shade or wearing long sleeves, sun glasses (UVA+UVB protection) and wide brim hats (4-inch brim around the entire circumference of the hat) are also recommended. - Call for new or changing lesions.   Purpura - Chronic; persistent and recurrent.  Treatable, but not curable. - Violaceous macules and patches - Benign - Related to trauma, age, sun damage and/or use of blood thinners, chronic use of topical and/or oral steroids - Observe - Can use OTC arnica containing moisturizer such as Dermend Bruise Formula if desired - Call for worsening or other concerns   Skin cancer screening performed today.   Return in about 6 months (around 01/06/2022) for Aks, PDT on scalp in 4 weeks .  IMarye Round, CMA, am acting as scribe for [name], MD .  Documentation: I have reviewed the above documentation for accuracy and completeness, and I agree with the above.  [name], MD

## 2021-08-06 ENCOUNTER — Ambulatory Visit (INDEPENDENT_AMBULATORY_CARE_PROVIDER_SITE_OTHER): Payer: Medicare HMO

## 2021-08-06 ENCOUNTER — Other Ambulatory Visit: Payer: Self-pay

## 2021-08-06 DIAGNOSIS — L570 Actinic keratosis: Secondary | ICD-10-CM | POA: Diagnosis not present

## 2021-08-06 MED ORDER — AMINOLEVULINIC ACID HCL 20 % EX SOLR
1.0000 "application " | Freq: Once | CUTANEOUS | Status: AC
  Administered 2021-08-06: 354 mg via TOPICAL

## 2021-08-06 NOTE — Patient Instructions (Signed)

## 2021-08-06 NOTE — Progress Notes (Signed)
1. AK (actinic keratosis) Scalp  Photodynamic therapy - Scalp Procedure discussed: discussed risks, benefits, side effects. and alternatives   Prep: site scrubbed/prepped with acetone   Location:  Scalp Number of lesions:  Multiple Type of treatment:  Blue light Aminolevulinic Acid (see MAR for details): Levulan Number of Levulan sticks used:  1 Incubation time (minutes):  120 Number of minutes under lamp:  16 Number of seconds under lamp:  40 Cooling:  Floor fan Outcome: patient tolerated procedure well with no complications   Post-procedure details: sunscreen applied and aftercare instructions given to patient    Aminolevulinic Acid HCl 20 % SOLR 354 mg - Scalp

## 2021-09-14 ENCOUNTER — Ambulatory Visit (INDEPENDENT_AMBULATORY_CARE_PROVIDER_SITE_OTHER): Payer: Medicare HMO

## 2021-09-14 ENCOUNTER — Other Ambulatory Visit: Payer: Self-pay

## 2021-09-14 ENCOUNTER — Ambulatory Visit (INDEPENDENT_AMBULATORY_CARE_PROVIDER_SITE_OTHER): Payer: Medicare HMO | Admitting: Family Medicine

## 2021-09-14 VITALS — BP 128/70 | HR 59 | Temp 97.0°F | Wt 145.5 lb

## 2021-09-14 DIAGNOSIS — M545 Low back pain, unspecified: Secondary | ICD-10-CM

## 2021-09-14 DIAGNOSIS — Z23 Encounter for immunization: Secondary | ICD-10-CM | POA: Diagnosis not present

## 2021-09-14 DIAGNOSIS — Z8781 Personal history of (healed) traumatic fracture: Secondary | ICD-10-CM | POA: Diagnosis not present

## 2021-09-14 NOTE — Assessment & Plan Note (Signed)
Suspect muscle strain. But in setting of hx of fractures, age and osteoporosis did get XR. My read w/o obvious changes, will f/u final. Advised PT to help with recovery and mobility. Encouraged scheduled tylenol and OK with rare low doses of ibuprofen if still having pain.

## 2021-09-14 NOTE — Progress Notes (Signed)
Subjective:     [name] is a 80 y.o. male presenting for Back Pain (L sided Lumbar pain x 3 weeks )     HPI  #Back pain - was cleaning out the fridge - had sudden pain on the left back - treatment: heating pad w/o improvement, has been using a back brace w/ some help  - has been taking ibuprofen before bed w/ improvement - no issues with bowel or bladder control - no tingling or numbness - denies cancer - denies hx of fracture - though documented in chart with chronic fractures  Caregiver for his wife  ROM has improved but worsens pain  Was worse with standing  Review of Systems   Social History   Tobacco Use  Smoking Status Never  Smokeless Tobacco Never        Objective:    BP Readings from Last 3 Encounters:  09/14/21 128/70  05/29/21 (!) 159/67  04/13/21 114/72   Wt Readings from Last 3 Encounters:  09/14/21 145 lb 8 oz (66 kg)  05/29/21 140 lb 8 oz (63.7 kg)  05/21/21 150 lb (68 kg)    BP 128/70   Pulse (!) 59   Temp (!) 97 F (36.1 C) (Temporal)   Wt 145 lb 8 oz (66 kg)   SpO2 98%   BMI 22.79 kg/m    Physical Exam Constitutional:      Appearance: Normal appearance. He is not ill-appearing or diaphoretic.  HENT:     Right Ear: External ear normal.     Left Ear: External ear normal.  Eyes:     General: No scleral icterus.    Extraocular Movements: Extraocular movements intact.     Conjunctiva/sclera: Conjunctivae normal.  Cardiovascular:     Rate and Rhythm: Normal rate and regular rhythm.  Pulmonary:     Effort: Pulmonary effort is normal. No respiratory distress.     Breath sounds: Normal breath sounds. No wheezing.  Musculoskeletal:     Cervical back: Neck supple.     Comments: Back Inspection: kyphosis Palpation: TTP along the left paraspinous muscles lumbar - otherwise no spinous ttp ROM: normal, but pain with flexion, extension, and left rotation Strength: decreased left hip flexor, otherwise normal right and left  LE strength Reflexes: normal Straight leg raise - unable to complete as pt in significant pain with laying down and raising the leg beyond 20 degrees - though no LE symptoms  Skin:    General: Skin is warm and dry.  Neurological:     Mental Status: He is alert. Mental status is at baseline.  Psychiatric:        Mood and Affect: Mood normal.     XR: My read: appears stable compared to 2019 imaging w/o clear sign of new fracture. Possibly some joint space narrowing.      Assessment & Plan:   Problem List Items Addressed This Visit       Musculoskeletal and Integument   History of vertebral compression fracture   Relevant Orders   DG Lumbar Spine Complete   Ambulatory referral to Physical Therapy     Other   Acute left-sided low back pain without sciatica - Primary    Suspect muscle strain. But in setting of hx of fractures, age and osteoporosis did get XR. My read w/o obvious changes, will f/u final. Advised PT to help with recovery and mobility. Encouraged scheduled tylenol and OK with rare low doses of ibuprofen if still having pain.  Relevant Orders   DG Lumbar Spine Complete   Ambulatory referral to Physical Therapy   Other Visit Diagnoses     Need for influenza vaccination       Relevant Orders   Flu Vaccine QUAD High Dose(Fluad) (Completed)        Return if symptoms worsen or fail to improve.  Lesleigh Noe, MD  This visit occurred during the SARS-CoV-2 public health emergency.  Safety protocols were in place, including screening questions prior to the visit, additional usage of staff PPE, and extensive cleaning of exam room while observing appropriate contact time as indicated for disinfecting solutions.

## 2021-09-14 NOTE — Patient Instructions (Addendum)
Low Back pain - X-ray today - physical therapy referral - Start taking Tylenol [id number] mg twice daily - OK to continue Ibuprofen 400 mg at night if needed   #Referral I have placed a referral to a specialist for you. You should receive a phone call from the specialty office. Make sure your voicemail is not full and that if you are able to answer your phone to unknown or new numbers.   It may take up to 2 weeks to hear about the referral. If you do not hear anything in 2 weeks, please call our office and ask to speak with the referral coordinator.

## 2021-12-15 NOTE — Progress Notes (Signed)
Subjective:   [name] is a 81 y.o. male who presents for Medicare Annual/Subsequent preventive examination.  I connected with [facility] [place] today by telephone and verified that I am speaking with the correct person using two identifiers. Location patient: home Location provider: work Persons participating in the virtual visit: patient, [name].    I discussed the limitations, risks, security and privacy concerns of performing an evaluation and management service by telephone and the availability of in person appointments. I also discussed with the patient that there may be a patient responsible charge related to this service. The patient expressed understanding and verbally consented to this telephonic visit.    Interactive audio and video telecommunications were attempted between this provider and patient, however failed, due to patient having technical difficulties OR patient did not have access to video capability.  We continued and completed visit with audio only.  Some vital signs may be absent or patient reported.   Time Spent with patient on telephone encounter: 20 minutes  Review of Systems     Cardiac Risk Factors include: advanced age (>32men, >74 women);hypertension     Objective:    Today's Vitals   12/16/21 1253  Weight: 145 lb (65.8 kg)  Height: 5\' 4"  (1.626 m)   Body mass index is 24.89 kg/m.  Advanced Directives 12/16/2021 05/29/2021 05/21/2021 04/13/2021 12/03/2020 11/20/2020 08/28/2020  Does Patient Have a Medical Advance Directive? Yes Yes No No Yes Yes Yes  Type of Paramedic of [place];Living will [place] will  Does patient want to make changes to medical advance directive? Yes (MAU/Ambulatory/Procedural Areas - Information given) - - - - No - Patient declined No - Patient declined  Copy of [place] in Chart? Yes - validated most recent copy scanned in  chart (See row information) No - copy requested - - - - No - copy requested  Would patient like information on creating a medical advance directive? - - No - Patient declined No - Patient declined - - -    Current Medications (verified) Outpatient Encounter Medications as of 12/16/2021  Medication Sig   ibuprofen (ADVIL) 800 MG tablet Take 1 tablet (800 mg total) by mouth every 8 (eight) hours as needed for mild pain or moderate pain.   metoprolol succinate (TOPROL-XL) 25 MG 24 hr tablet Take 0.5 tablets (12.5 mg total) by mouth daily.   HYDROcodone-acetaminophen (NORCO) 5-325 MG tablet Take 1 tablet by mouth every 6 (six) hours as needed for up to 6 doses for moderate pain. (Patient not taking: Reported on 12/16/2021)   No facility-administered encounter medications on file as of 12/16/2021.    Allergies (verified) Patient has no known allergies.   History: Past Medical History:  Diagnosis Date   Actinic keratosis    BPH with urinary obstruction    sees urology Dr. [name]   Cardiac murmur    grade II/VI mid-systolic; "blowing" and lower LSB   Carotid stenosis 07/20/2016   Minimal on [place] 07/2016. Monitor clinically.   Complication of anesthesia    (+) postoperative delirium; "hallucinations"   Depression with anxiety    Elevated PSA 2014   sees urology - normal biopsies x 3   HLD (hyperlipidemia)    Left kidney mass 09/2014   on CT scan thought consistent with simple cyst on prior US   Nasal obstruction    congenital R sided   Obstructive pyelonephritis 09/2014  s/p hospitalization and treatment with levaquin   OSA (obstructive sleep apnea)    pt denies   Osteoporosis 06/04/2018   DEXA T score -37 at L spine   Paroxysmal atrial fibrillation (HCC)    Recurrent kidney stones 2014   extensive bilateral nephrolithiasis, sees urology [name]), hydrocodone prn   SOB (shortness of breath)    Squamous cell carcinoma of skin 07/25/2019   crown scalp   Squamous cell carcinoma of  skin 06/29/2017   SCC IS ant to crown post   Symptomatic PVCs    per prior cardiologist, on beta blocker   Urge incontinence    thought overactive bladder   Past Surgical History:  Procedure Laterality Date   CARDIAC CATHETERIZATION  03/2012   EF 45-50%, no LVH, + impaired LV relaxation, no PH, no valve abnormalities   CARDIOVASCULAR STRESS TEST  03/2012   mild peri infarct ischemia in apical segment, decreased EF   CATARACT EXTRACTION Right 01/2013   COLONOSCOPY  02/2008   mod diverticulosis o/w WNL (2009)   CYSTOSCOPY  10/2014   s/p stone removal, with stent removal [name])   CYSTOSCOPY WITH STENT PLACEMENT Right 11/18/2020   Procedure: CYSTOSCOPY WITH STENT PLACEMENT;  Surgeon: Abbie Sons, MD;  Location: [facility];  Service: Urology;  Laterality: Right;   CYSTOSCOPY/URETEROSCOPY/HOLMIUM LASER/STENT PLACEMENT Left 08/11/2020   Procedure: CYSTOSCOPY/URETEROSCOPY/STENT PLACEMENT;  Surgeon: Hollice Espy, MD;  Location: [facility];  Service: Urology;  Laterality: Left;   CYSTOSCOPY/URETEROSCOPY/HOLMIUM LASER/STENT PLACEMENT N/A 08/28/2020   Procedure: CYSTOSCOPY/URETEROSCOPY/HOLMIUM LASER/STENT exchange;  Surgeon: Hollice Espy, MD;  Location: [facility];  Service: Urology;  Laterality: N/A;   CYSTOSCOPY/URETEROSCOPY/HOLMIUM LASER/STENT PLACEMENT Right 12/09/2020   Procedure: CYSTOSCOPY/URETEROSCOPY/HOLMIUM LASER/STENT Exchange;  Surgeon: Abbie Sons, MD;  Location: [facility];  Service: Urology;  Laterality: Right;   HOLEP-LASER ENUCLEATION OF THE PROSTATE WITH MORCELLATION N/A 08/11/2020   Procedure: HOLEP-LASER ENUCLEATION OF THE PROSTATE WITH MORCELLATION;  Surgeon: Hollice Espy, MD;  Location: [facility];  Service: Urology;  Laterality: N/A;   TONSILECTOMY, ADENOIDECTOMY, BILATERAL MYRINGOTOMY AND TUBES     removed as a child   US ECHOCARDIOGRAPHY  03/2012   hypokinetic anterior wall, EF 45-50%, impaired relaxation pattern, mild LA dilation, mild-mod MR, mild-mod AR    Family History  Problem Relation Age of Onset   Kidney Stones Mother    CAD Father    Kidney Stones Daughter    Diabetes Neg Hx    Cancer Neg Hx    Stroke Neg Hx    Hypertension Neg Hx    Prostate cancer Neg Hx    Kidney cancer Neg Hx    Bladder Cancer Neg Hx    Social History   Socioeconomic History   Marital status: Married    Spouse name: Not on file   Number of children: Not on file   Years of education: Not on file   Highest education level: Not on file  Occupational History   Not on file  Tobacco Use   Smoking status: Never   Smokeless tobacco: Never  Vaping Use   Vaping Use: Never used  Substance and Sexual Activity   Alcohol use: No   Drug use: No   Sexual activity: Not Currently  Other Topics Concern   Not on file  Social History Narrative   Lives with wife [name])    1 grown daughter   Occupation: retired, worked in Public relations account executive for 30 yrs   Edu: GED   Activity: walking some   Diet: good  water, fruits/vegetables daily.      Advanced directives: daughter is HCPOA for now   Social Determinants of Health   Financial Resource Strain: Medium Risk   Difficulty of Paying Living Expenses: Somewhat hard  Food Insecurity: Food Insecurity Present   Worried About Charity fundraiser in the Last Year: Sometimes true   Arboriculturist in the Last Year: Sometimes true  Transportation Needs: No Transportation Needs   Lack of Transportation (Medical): No   Lack of Transportation (Non-Medical): No  Physical Activity: Inactive   Days of Exercise per Week: 0 days   Minutes of Exercise per Session: 0 min  Stress: No Stress Concern Present   Feeling of Stress : Not at all  Social Connections: Moderately Integrated   Frequency of Communication with Friends and Family: More than three times a week   Frequency of Social Gatherings with Friends and Family: Once a week   Attends Religious Services: More than 4 times per year   Active Member of [employer] or  Organizations: No   Attends Archivist Meetings: Never   Marital Status: Married    Tobacco Counseling Counseling given: Not Answered   Clinical Intake:  Pre-visit preparation completed: Yes  Pain : No/denies pain     BMI - recorded: 24.89 Nutritional Status: BMI of 19-24  Normal Nutritional Risks: None Diabetes: No     Diabetic? No  Interpreter Needed?: No Information entered by :: Orrin Brigham LPN   Activities of Daily Living In your present state of health, do you have any difficulty performing the following activities: 12/16/2021 05/21/2021  Hearing? N -  Vision? Y -  Difficulty concentrating or making decisions? N -  Walking or climbing stairs? N N  Dressing or bathing? N -  Doing errands, shopping? N N  Preparing Food and eating ? N -  Using the Toilet? N -  In the past six months, have you accidently leaked urine? Y -  Do you have problems with loss of bowel control? N -  Managing your Medications? N -  Managing your Finances? N -  Housekeeping or managing your Housekeeping? N -  Some recent data might be hidden    Patient Care Team: [name], MD as PCP - General (Family Medicine) Thelma Comp, [place] as Referring Physician (Optometry)  Indicate any recent Medical Services you may have received from other than [facility] providers in the past year (date may be approximate).     Assessment:   This is a routine wellness examination for [place].  Hearing/Vision screen Hearing Screening - Comments:: Patient states having tinnitis Vision Screening - Comments:: Last exam 1 year ago @ [facility], plans to make an appointment. Wears glasses  Dietary issues and exercise activities discussed: Current Exercise Habits: The patient does not participate in regular exercise at present   Goals Addressed             This Visit's Progress    Patient Stated       Would like to maintain current routine       Depression Screen PHQ 2/9  Scores 12/16/2021 08/14/2019 08/09/2018 08/05/2017 05/21/2016 02/19/2014  PHQ - 2 Score 0 0 0 1 0 0  PHQ- 9 Score - 0 0 - - -    Fall Risk Fall Risk  12/16/2021 08/14/2019 08/09/2018 08/05/2017 05/21/2016  Falls in the past year? 0 0 No No No  Number falls in past yr: 0 - - - -  Injury  with Fall? 0 - - - -  Risk for fall due to : No Fall Risks - - - -  Follow up Falls prevention discussed Falls evaluation completed;Falls prevention discussed - - -    FALL RISK PREVENTION PERTAINING TO THE HOME:  Any stairs in or around the home? Yes  If so, are there any without handrails? No  Home free of loose throw rugs in walkways, pet beds, electrical cords, etc? Yes  Adequate lighting in your home to reduce risk of falls? Yes   ASSISTIVE DEVICES UTILIZED TO PREVENT FALLS:  Life alert? No  Use of a cane, walker or w/c? No  Grab bars in the bathroom? Yes  Shower chair or bench in shower? No  Elevated toilet seat or a handicapped toilet? No   TIMED UP AND GO:  Was the test performed? No .    Cognitive Function: Normal cognitive status assessed by this Nurse Health Advisor. No abnormalities found.   MMSE - Mini Mental State Exam 08/14/2019 08/09/2018 05/21/2016  Orientation to time 5 5 5   Orientation to Place 5 5 5   Registration 3 3 3   Attention/ Calculation 3 0 0  Recall 3 3 3   Language- name 2 objects 0 0 0  Language- repeat 1 1 1   Language- follow 3 step command 0 3 3  Language- read & follow direction 0 0 0  Write a sentence 0 0 0  Copy design 0 0 0  Total score 20 20 20         Immunizations Immunization History  Administered Date(s) Administered   Fluad Quad(high Dose 65+) 08/21/2019, 09/14/2021   Influenza Whole 10/13/2013   Influenza, High Dose Seasonal PF 10/07/2020   Influenza,inj,Quad PF,6+ Mos 08/26/2015, 08/25/2016, 09/01/2017, 08/11/2018   PFIZER(Purple Top)SARS-COV-2 Vaccination 01/18/2020, 02/12/2020   Pfizer Covid-19 Vaccine Bivalent Booster 39yrs & up 12/15/2021    Pneumococcal Conjugate-13 02/19/2014   Pneumococcal Polysaccharide-23 01/18/2011, 11/22/2020   Tdap 01/18/2011   Zoster Recombinat (Shingrix) 10/03/2019   Zoster, Live 06/23/2015    TDAP status: Due, Education has been provided regarding the importance of this vaccine. Advised may receive this vaccine at local pharmacy or Health Dept. Aware to provide a copy of the vaccination record if obtained from local pharmacy or Health Dept. Verbalized acceptance and understanding.  Flu Vaccine status: Up to date  Pneumococcal vaccine status: Up to date  Covid-19 vaccine status: Completed vaccines  Qualifies for Shingles Vaccine? Yes   Zostavax completed Yes   Shingrix Completed?: Yes  Screening Tests Health Maintenance  Topic Date Due   Zoster Vaccines- Shingrix (2 of 2) 11/28/2019   TETANUS/TDAP  01/18/2021   COVID-19 Vaccine (4 - Booster for Pfizer series) 02/09/2022   Pneumonia Vaccine 31+ Years old  Completed   INFLUENZA VACCINE  Completed   HPV VACCINES  Aged Out    Health Maintenance  Health Maintenance Due  Topic Date Due   Zoster Vaccines- Shingrix (2 of 2) 11/28/2019   TETANUS/TDAP  01/18/2021    Colorectal cancer screening: No longer required.   Lung Cancer Screening: (Low Dose CT Chest recommended if Age 77-80 years, 30 pack-year currently smoking OR have quit w/in 15years.) does not qualify.     Additional Screening:  Hepatitis C Screening: does not qualify  Vision Screening: Recommended annual ophthalmology exams for early detection of glaucoma and other disorders of the eye. Is the patient up to date with their annual eye exam?  No  Who is the provider or what is  the name of the office in which the patient attends annual eye exams? Provider information unavailable   Dental Screening: Recommended annual dental exams for proper oral hygiene  Community Resource Referral / Chronic Care Management: CRR required this visit?  Yes   CCM required this visit?  No       Plan:     I have personally reviewed and noted the following in the patients chart:   Medical and social history Use of alcohol, tobacco or illicit drugs  Current medications and supplements including opioid prescriptions. Patient is not currently taking opioid prescriptions. Functional ability and status Nutritional status Physical activity Advanced directives List of other physicians Hospitalizations, surgeries, and ER visits in previous 12 months Vitals Screenings to include cognitive, depression, and falls Referrals and appointments  In addition, I have reviewed and discussed with patient certain preventive protocols, quality metrics, and best practice recommendations. A written personalized care plan for preventive services as well as general preventive health recommendations were provided to patient.   Due to this being a telephonic visit, the after visit summary with patients personalized plan was offered to patient via mail or my-chart.  Patient would like to access on my-chart.    Loma Messing, LPN   02/18/299   Nurse Health Advisor  Nurse Notes: none

## 2021-12-16 ENCOUNTER — Ambulatory Visit (INDEPENDENT_AMBULATORY_CARE_PROVIDER_SITE_OTHER): Payer: Medicare HMO

## 2021-12-16 VITALS — Ht 64.0 in | Wt 145.0 lb

## 2021-12-16 DIAGNOSIS — Z0000 Encounter for general adult medical examination without abnormal findings: Secondary | ICD-10-CM | POA: Diagnosis not present

## 2021-12-16 NOTE — Patient Instructions (Signed)
[name] , Thank you for taking time to complete your Medicare Wellness Visit. I appreciate your ongoing commitment to your health goals. Please review the following plan we discussed and let me know if I can assist you in the future.   Screening recommendations/referrals: Colonoscopy: no longer required  Recommended yearly ophthalmology/optometry visit for glaucoma screening and checkup Recommended yearly dental visit for hygiene and checkup  Vaccinations: Influenza vaccine: up to date Pneumococcal vaccine: up to date Tdap vaccine: Due-last completed 01/18/11-May obtain vaccine at your local pharmacy. Shingles vaccine: second dose due, discuss with your local pharmacy   Covid-19: up to date  Advanced directives: Please bring a copy of Living Will and/or [place] for your chart.   Conditions/risks identified: see problem list  Next appointment: Follow up in one year for your annual wellness visit. 12/20/22 @ 2:45pm, this will be a telephone visit  Preventive Care 83 Years and Older, Male Preventive care refers to lifestyle choices and visits with your health care provider that can promote health and wellness. What does preventive care include? A yearly physical exam. This is also called an annual well check. Dental exams once or twice a year. Routine eye exams. Ask your health care provider how often you should have your eyes checked. Personal lifestyle choices, including: Daily care of your teeth and gums. Regular physical activity. Eating a healthy diet. Avoiding tobacco and drug use. Limiting alcohol use. Practicing safe sex. Taking low doses of aspirin every day. Taking vitamin and mineral supplements as recommended by your health care provider. What happens during an annual well check? The services and screenings done by your health care provider during your annual well check will depend on your age, overall health, lifestyle risk factors, and family  history of disease. Counseling  Your health care provider may ask you questions about your: Alcohol use. Tobacco use. Drug use. Emotional well-being. Home and relationship well-being. Sexual activity. Eating habits. History of falls. Memory and ability to understand (cognition). Work and work Statistician. Screening  You may have the following tests or measurements: Height, weight, and BMI. Blood pressure. Lipid and cholesterol levels. These may be checked every 5 years, or more frequently if you are over 52 years old. Skin check. Lung cancer screening. You may have this screening every year starting at age 32 if you have a 30-pack-year history of smoking and currently smoke or have quit within the past 15 years. Fecal occult blood test (FOBT) of the stool. You may have this test every year starting at age 78. Flexible sigmoidoscopy or colonoscopy. You may have a sigmoidoscopy every 5 years or a colonoscopy every 10 years starting at age 47. Prostate cancer screening. Recommendations will vary depending on your family history and other risks. Hepatitis C blood test. Hepatitis B blood test. Sexually transmitted disease (STD) testing. Diabetes screening. This is done by checking your blood sugar (glucose) after you have not eaten for a while (fasting). You may have this done every 1-3 years. Abdominal aortic aneurysm (AAA) screening. You may need this if you are a current or former smoker. Osteoporosis. You may be screened starting at age 28 if you are at high risk. Talk with your health care provider about your test results, treatment options, and if necessary, the need for more tests. Vaccines  Your health care provider may recommend certain vaccines, such as: Influenza vaccine. This is recommended every year. Tetanus, diphtheria, and acellular pertussis (Tdap, Td) vaccine. You may need a Td  booster every 10 years. Zoster vaccine. You may need this after age 37. Pneumococcal  13-valent conjugate (PCV13) vaccine. One dose is recommended after age 27. Pneumococcal polysaccharide (PPSV23) vaccine. One dose is recommended after age 70. Talk to your health care provider about which screenings and vaccines you need and how often you need them. This information is not intended to replace advice given to you by your health care provider. Make sure you discuss any questions you have with your health care provider. Document Released: 12/26/2015 Document Revised: 08/18/2016 Document Reviewed: 09/30/2015 Elsevier Interactive Patient Education  2017 [employer] Prevention in the Home Falls can cause injuries. They can happen to people of all ages. There are many things you can do to make your home safe and to help prevent falls. What can I do on the outside of my home? Regularly fix the edges of walkways and driveways and fix any cracks. Remove anything that might make you trip as you walk through a door, such as a raised step or threshold. Trim any bushes or trees on the path to your home. Use bright outdoor lighting. Clear any walking paths of anything that might make someone trip, such as rocks or tools. Regularly check to see if handrails are loose or broken. Make sure that both sides of any steps have handrails. Any raised decks and porches should have guardrails on the edges. Have any leaves, snow, or ice cleared regularly. Use sand or salt on walking paths during winter. Clean up any spills in your garage right away. This includes oil or grease spills. What can I do in the bathroom? Use night lights. Install grab bars by the toilet and in the tub and shower. Do not use towel bars as grab bars. Use non-skid mats or decals in the tub or shower. If you need to sit down in the shower, use a plastic, non-slip stool. Keep the floor dry. Clean up any water that spills on the floor as soon as it happens. Remove soap buildup in the tub or shower regularly. Attach  bath mats securely with double-sided non-slip rug tape. Do not have throw rugs and other things on the floor that can make you trip. What can I do in the bedroom? Use night lights. Make sure that you have a light by your bed that is easy to reach. Do not use any sheets or blankets that are too big for your bed. They should not hang down onto the floor. Have a firm chair that has side arms. You can use this for support while you get dressed. Do not have throw rugs and other things on the floor that can make you trip. What can I do in the kitchen? Clean up any spills right away. Avoid walking on wet floors. Keep items that you use a lot in easy-to-reach places. If you need to reach something above you, use a strong step stool that has a grab bar. Keep electrical cords out of the way. Do not use floor polish or wax that makes floors slippery. If you must use wax, use non-skid floor wax. Do not have throw rugs and other things on the floor that can make you trip. What can I do with my stairs? Do not leave any items on the stairs. Make sure that there are handrails on both sides of the stairs and use them. Fix handrails that are broken or loose. Make sure that handrails are as long as the stairways. Check any carpeting  to make sure that it is firmly attached to the stairs. Fix any carpet that is loose or worn. Avoid having throw rugs at the top or bottom of the stairs. If you do have throw rugs, attach them to the floor with carpet tape. Make sure that you have a light switch at the top of the stairs and the bottom of the stairs. If you do not have them, ask someone to add them for you. What else can I do to help prevent falls? Wear shoes that: Do not have high heels. Have rubber bottoms. Are comfortable and fit you well. Are closed at the toe. Do not wear sandals. If you use a stepladder: Make sure that it is fully opened. Do not climb a closed stepladder. Make sure that both sides of the  stepladder are locked into place. Ask someone to hold it for you, if possible. Clearly mark and make sure that you can see: Any grab bars or handrails. First and last steps. Where the edge of each step is. Use tools that help you move around (mobility aids) if they are needed. These include: Canes. Walkers. Scooters. Crutches. Turn on the lights when you go into a dark area. Replace any light bulbs as soon as they burn out. Set up your furniture so you have a clear path. Avoid moving your furniture around. If any of your floors are uneven, fix them. If there are any pets around you, be aware of where they are. Review your medicines with your doctor. Some medicines can make you feel dizzy. This can increase your chance of falling. Ask your doctor what other things that you can do to help prevent falls. This information is not intended to replace advice given to you by your health care provider. Make sure you discuss any questions you have with your health care provider. Document Released: 09/25/2009 Document Revised: 05/06/2016 Document Reviewed: 01/03/2015 Elsevier Interactive Patient Education  2017 [employer].

## 2021-12-18 ENCOUNTER — Telehealth: Payer: Self-pay

## 2021-12-18 NOTE — Telephone Encounter (Signed)
° °  Telephone encounter was:  Successful.  12/18/2021 Name: [name] MRN: [id number] DOB: 10/24/1941  [name] is a 81 y.o. year old male who is a primary care patient of [name], MD . The community resource team was consulted for assistance with Transportation Needs , Food Insecurity, Home Modifications, and Rx discounts.  Care guide performed the following interventions:  Spoke to pt and spouse about meals on wheels. They are both interested. We also talked about transportation to and from [facility] appointments, as well as life alert, goodrx discounts, and aging gracefully. Information will be sent in mail.  Follow Up Plan:   Wait for confirmation mail has been received.  Gas management  [place], [place]  Main Phone: [phone]   E-mail: [email or web address]  Website: [email or web address]

## 2021-12-20 IMAGING — CT CT RENAL STONE PROTOCOL
2 of 4 series · 16 of 46 positions shown, 18 images · non-contrast
Comparison: 0618

CLINICAL DATA: Bilateral flank pain and hematuria

EXAM:
CT ABDOMEN AND PELVIS WITHOUT CONTRAST
TECHNIQUE: Multidetector CT imaging of the abdomen and pelvis was performed
following the standard protocol without IV contrast.

[Series 2: stone full standard · axial · 0.78mm/px · z∈[-1104,-684]mm · 13 of 92 slices shown, 15 images]
[im 4/92  soft-tissue]
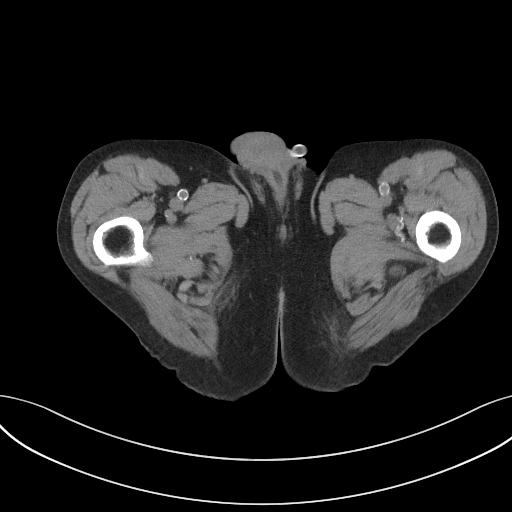
[im 4/92  bone]
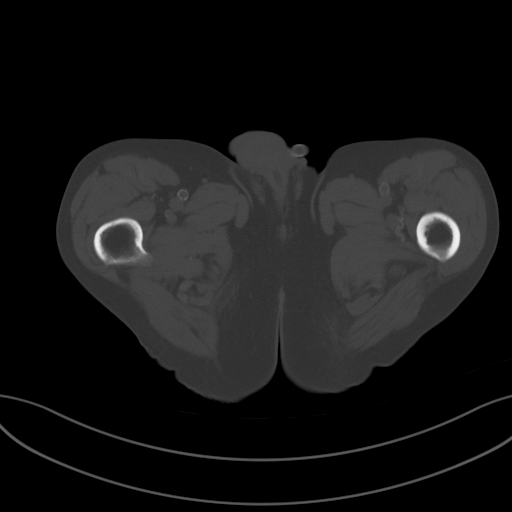
[im 12/92  soft-tissue]
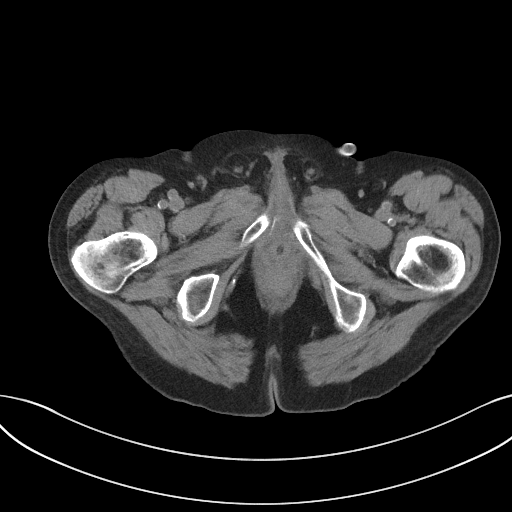
[im 20/92  soft-tissue]
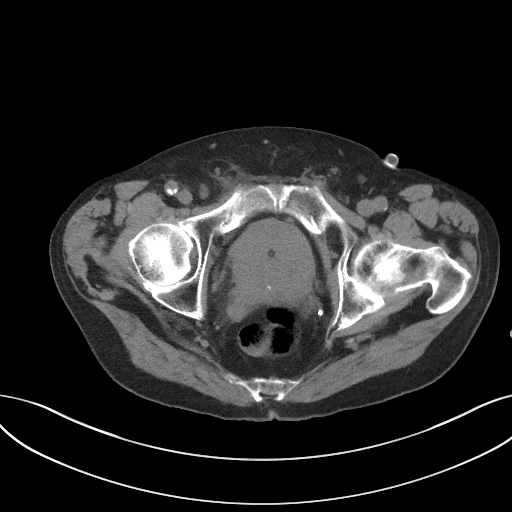
[im 24/92  soft-tissue]
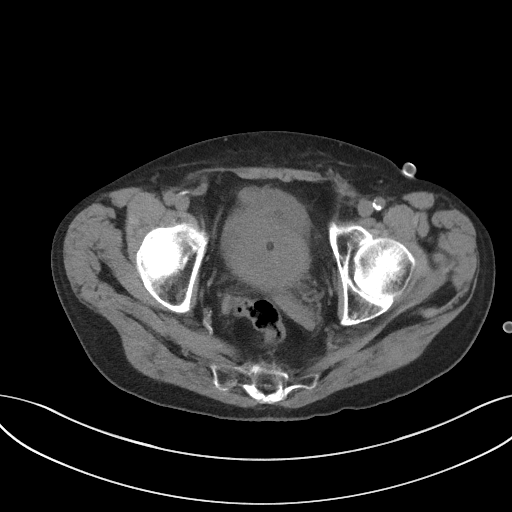
[im 32/92  soft-tissue]
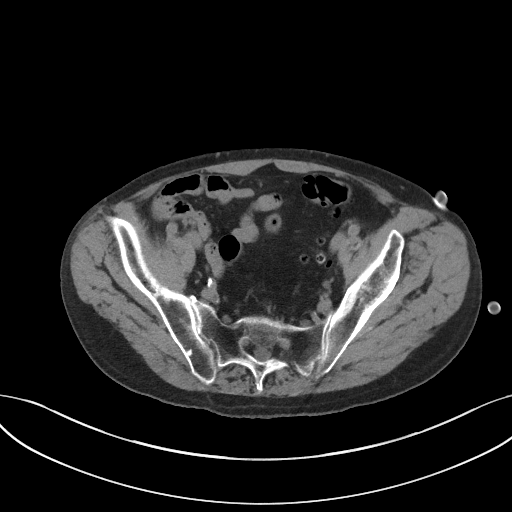
[im 40/92  soft-tissue]
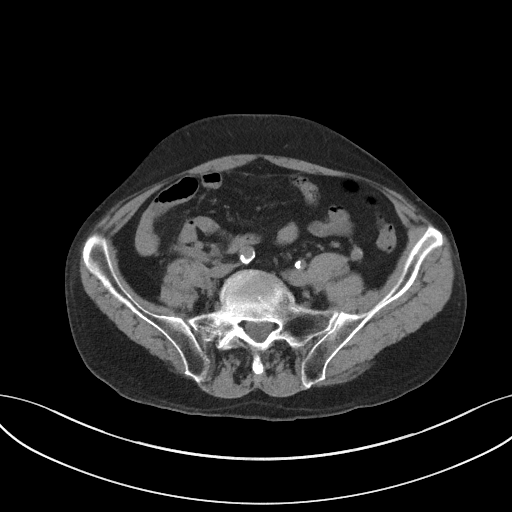
[im 48/92  soft-tissue]
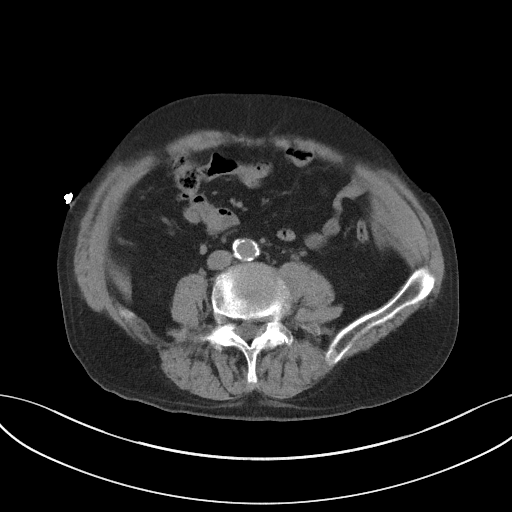
[im 52/92  soft-tissue]
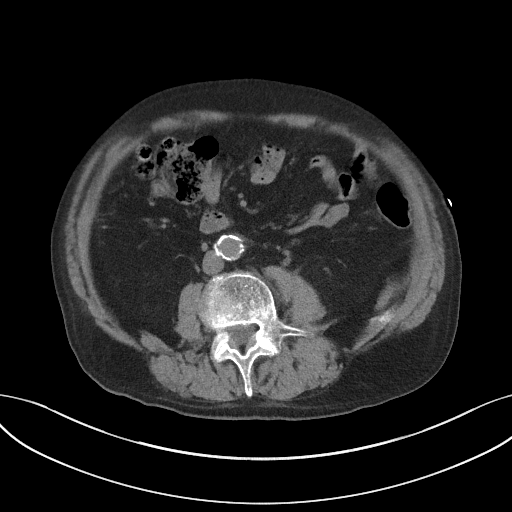
[im 60/92  soft-tissue]
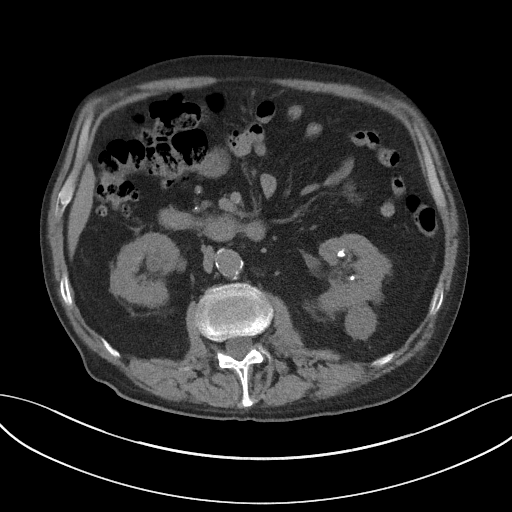
[im 60/92  bone]
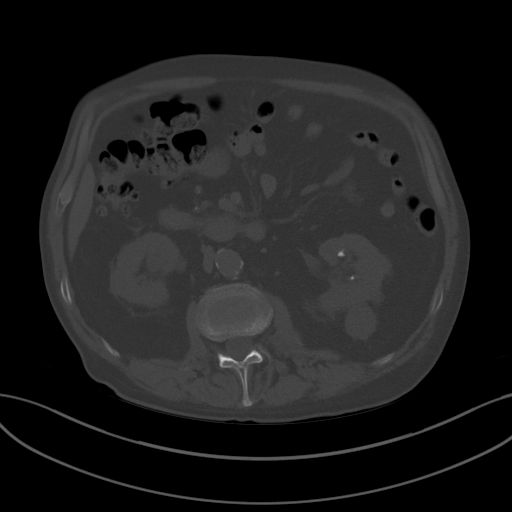
[im 68/92  soft-tissue]
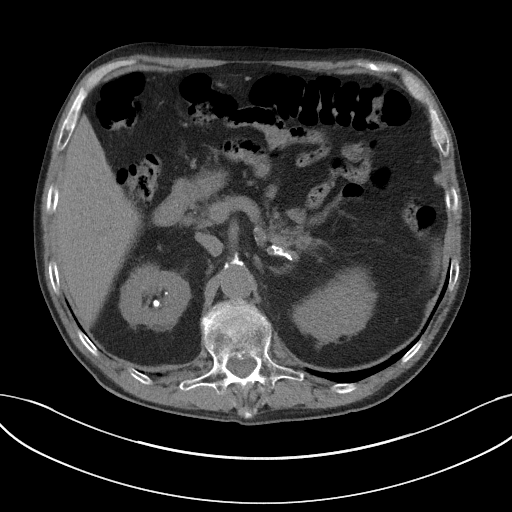
[im 72/92  soft-tissue]
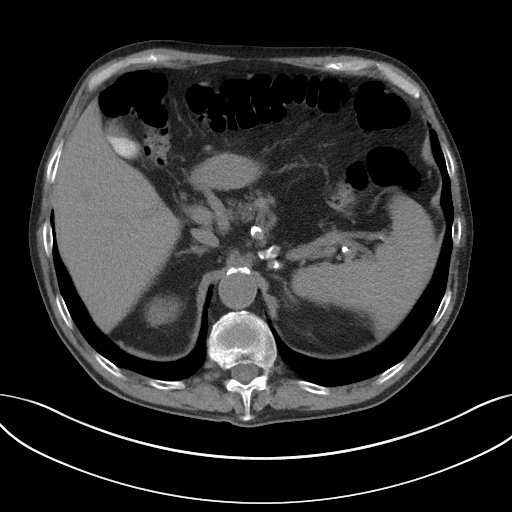
[im 80/92  soft-tissue]
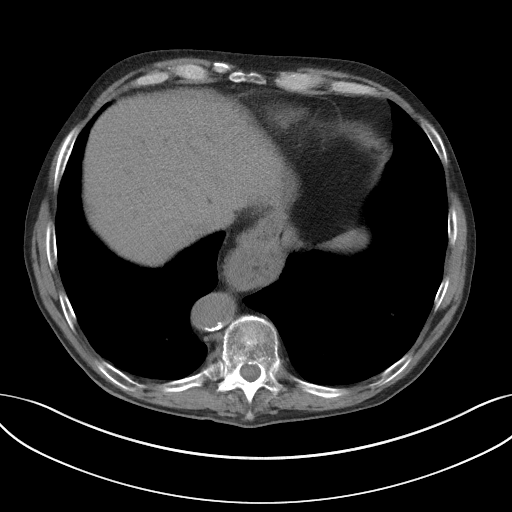
[im 88/92  soft-tissue]
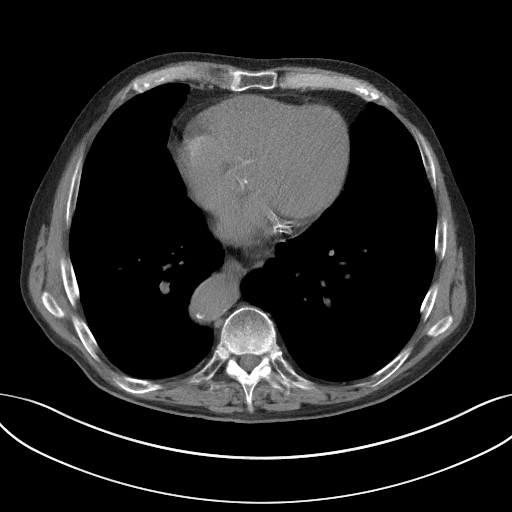

[Series 5: coronal · coronal · 0.66mm/px · 3 of 134 slices shown]
[im 45/134  soft-tissue]
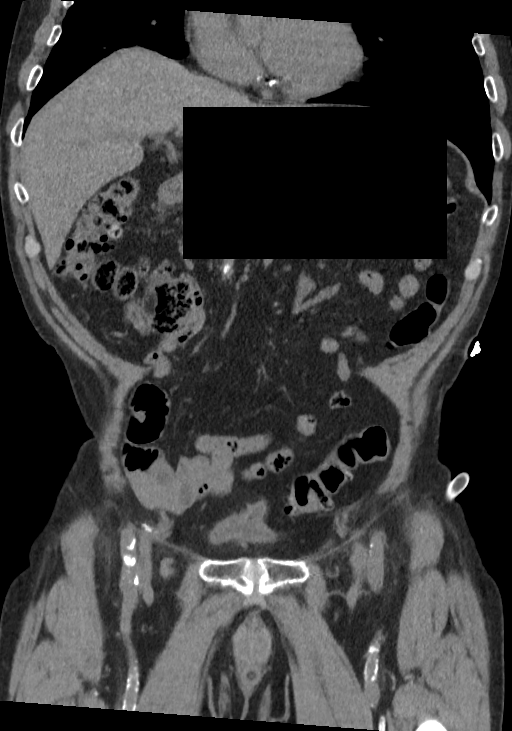
[im 60/134  soft-tissue]
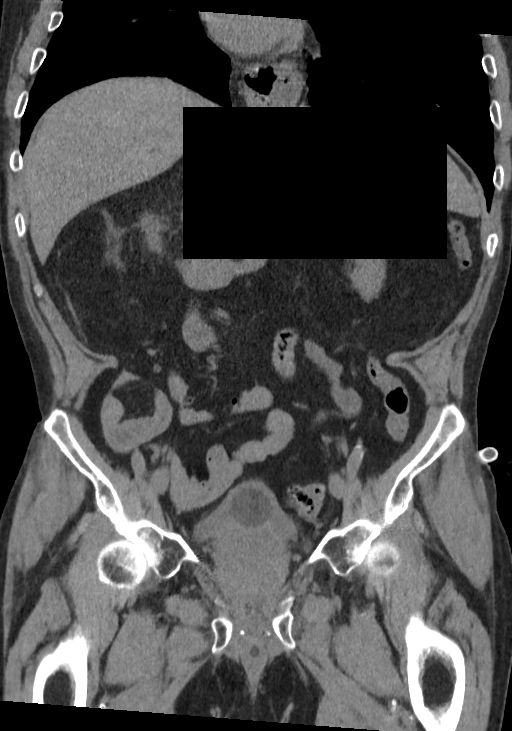
[im 74/134  soft-tissue]
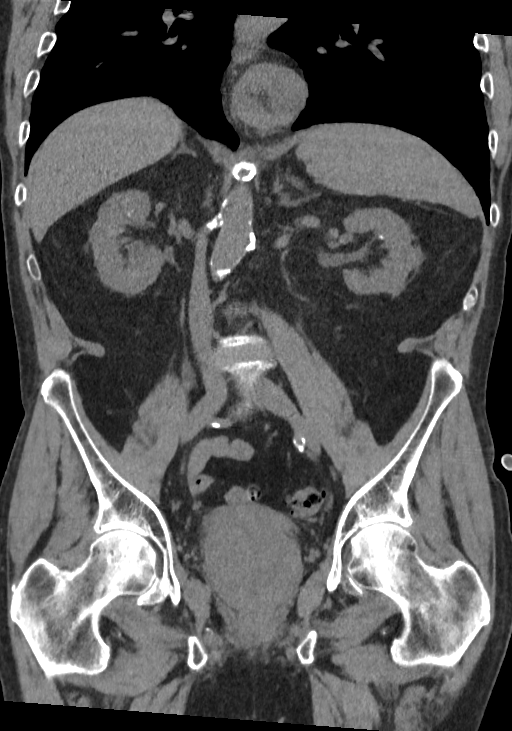

[16 of 46 positions shown; findings below may reference images not displayed]

FINDINGS: Lower chest: No acute abnormality.

Hepatobiliary: Too small to characterize hypoattenuating lesion of
the liver. Layering gallbladder sludge and possible small
gallstones. No biliary dilatation.

Pancreas: Unremarkable.

Spleen: Unremarkable.

Adrenals/Urinary Tract: Adrenals are unremarkable. Exophytic left
renal cyst. Multiple nonobstructing renal calculi bilaterally.
Largest measures 5 mm. There are 2 calculi within the distal left
ureter just above the ureterovesical junction. The more proximal
measures 2 mm and the more distal measures 5 mm. No significant
hydroureteronephrosis. Bladder is decompressed by Foley catheter.

Stomach/Bowel: Moderate hiatal hernia. Bowel is normal in caliber.
There is sigmoid diverticulosis.

Vascular/Lymphatic: Diffuse aortic atherosclerosis. There are no
enlarged lymph nodes identified.

Reproductive: Similar appearance of marked prostate enlargement.

Other: No ascites.  No acute abnormality of the abdominal wall.

Musculoskeletal: There are chronic appearing compression deformities
T11, T12, L1, and L4.
IMPRESSION: Two calculi within the distal left ureter just above the
ureterovesical junction. Larger measures 5 mm. No significant
proximal hydroureteronephrosis.

Multiple nonobstructing bilateral renal calculi measuring up to 5
mm.

Additional chronic findings detailed above.

## 2021-12-23 ENCOUNTER — Telehealth: Payer: Self-pay | Admitting: Family Medicine

## 2021-12-23 NOTE — Telephone Encounter (Signed)
Please call patient and schedule annual physical. 

## 2021-12-25 ENCOUNTER — Telehealth: Payer: Self-pay

## 2021-12-25 NOTE — Telephone Encounter (Signed)
Pt stated he will make his appointment when we arrive back @ [facility]

## 2021-12-25 NOTE — Telephone Encounter (Signed)
Should be back at [facility] by the end of February. Please try to get patient to go ahead and get on Dr. Bosie Clos schedule shortly after our return.

## 2021-12-25 NOTE — Telephone Encounter (Signed)
° °  Telephone encounter was:  Successful.  12/25/2021 Name: [name] MRN: [id number] DOB: 04/16/1941  [name] is a 81 y.o. year old male who is a primary care patient of [name], MD . The community resource team was consulted for assistance with [place] guide performed the following interventions:  Pt advised meals on wheels in-take was completed. I called Ben at meals on wheels to ensure everything was all set, as pt stated he was expecting a call back from [place]. Ben advised in-take was completed on 1/3 and pt just needs to await on Education officer, museum call.   Follow Up Plan:  No further follow up planned at this time. The patient has been provided with needed resources.  [place] management  [place], [place]  Main Phone: [phone]   E-mail: [email or web address]  Website: [email or web address]

## 2021-12-31 ENCOUNTER — Telehealth: Payer: Self-pay

## 2021-12-31 NOTE — Telephone Encounter (Signed)
° °  Telephone encounter was:  Successful.  12/31/2021 Name: Kerrion Kemppainen MRN: [id number] DOB: 1941-02-14  Wyn[place] Romey is a 81 y.o. year old male who is a primary care patient of [name], MD . The community resource team was consulted for assistance with  Transportation Needs , Food Insecurity, Home Modifications, and Rx discounts.  Care guide performed the following interventions:  Patient received information in mail . (Enc: [place] on [employer], Transport planner,  Building services engineer, Investment banker, operational, and Aging Gracefully)  Follow Up Plan:  No further follow up planned at this time. The patient has been provided with needed resources.  [place] management  [place], [place]  Main Phone: [phone]   E-mail: [email or web address]  Website: [email or web address]

## 2022-01-18 ENCOUNTER — Ambulatory Visit: Payer: Medicare HMO | Admitting: Dermatology

## 2022-01-18 ENCOUNTER — Other Ambulatory Visit: Payer: Self-pay

## 2022-01-18 DIAGNOSIS — L700 Acne vulgaris: Secondary | ICD-10-CM | POA: Diagnosis not present

## 2022-01-18 DIAGNOSIS — L820 Inflamed seborrheic keratosis: Secondary | ICD-10-CM

## 2022-01-18 DIAGNOSIS — L570 Actinic keratosis: Secondary | ICD-10-CM | POA: Diagnosis not present

## 2022-01-18 DIAGNOSIS — L578 Other skin changes due to chronic exposure to nonionizing radiation: Secondary | ICD-10-CM | POA: Diagnosis not present

## 2022-01-18 DIAGNOSIS — L821 Other seborrheic keratosis: Secondary | ICD-10-CM | POA: Diagnosis not present

## 2022-01-18 NOTE — Patient Instructions (Signed)

## 2022-01-18 NOTE — Progress Notes (Signed)
° °  Follow-Up Visit   Subjective  [name] is a 81 y.o. male who presents for the following: Actinic Keratosis (S/P PDT of the scalp 07/2021. Patient is here today for check for new or persistent skin lesions on the face and scalp. ). The patient has spots, moles and lesions to be evaluated, some may be new or changing.   The following portions of the chart were reviewed this encounter and updated as appropriate:   Tobacco   Allergies   Meds   Problems   Med Hx   Surg Hx   Fam Hx      Review of Systems:  No other skin or systemic complaints except as noted in HPI or Assessment and Plan.  Objective  Well appearing patient in no apparent distress; mood and affect are within normal limits.  A focused examination was performed including the face, scalp, and neck. Relevant physical exam findings are noted in the Assessment and Plan.  crown x 1 Erythematous stuck-on, waxy papule or plaque  L crown x 1, vertex x 1 (2) Erythematous thin papules/macules with gritty scale.   Post neck [place].   Assessment & Plan  Inflamed seborrheic keratosis crown x 1  Destruction of lesion - crown x 1 Complexity: simple   Destruction method: cryotherapy   Informed consent: discussed and consent obtained   Timeout:  patient name, date of birth, surgical site, and procedure verified Lesion destroyed using liquid nitrogen: Yes   Region frozen until ice ball extended beyond lesion: Yes   Outcome: patient tolerated procedure well with no complications   Post-procedure details: wound care instructions given    AK (actinic keratosis) (2) L crown x 1, vertex x 1  Destruction of lesion - L crown x 1, vertex x 1 Complexity: simple   Destruction method: cryotherapy   Informed consent: discussed and consent obtained   Timeout:  patient name, date of birth, surgical site, and procedure verified Lesion destroyed using liquid nitrogen: Yes   Region frozen until ice ball extended beyond lesion: Yes    Outcome: patient tolerated procedure well with no complications   Post-procedure details: wound care instructions given    [place] Post neck  Benign-appearing.  Observation.  Call clinic for new or changing lesions.  Recommend daily use of broad spectrum spf 30+ sunscreen to sun-exposed areas.    Actinic Damage - chronic, secondary to cumulative UV radiation exposure/sun exposure over time - diffuse scaly erythematous macules with underlying dyspigmentation - Recommend daily broad spectrum sunscreen SPF 30+ to sun-exposed areas, reapply every 2 hours as needed.  - Recommend staying in the shade or wearing long sleeves, sun glasses (UVA+UVB protection) and wide brim hats (4-inch brim around the entire circumference of the hat). - Call for new or changing lesions.  Seborrheic Keratoses - Stuck-on, waxy, tan-brown papules and/or plaques  - Benign-appearing - Discussed benign etiology and prognosis. - Observe - Call for any changes  Return in about 2 months (around 03/18/2022) for AK and ISK f/u .  Luther Redo, CMA, am acting as scribe for [name], MD . Documentation: I have reviewed the above documentation for accuracy and completeness, and I agree with the above.  [name], MD

## 2022-01-19 ENCOUNTER — Encounter: Payer: Self-pay | Admitting: Dermatology

## 2022-03-10 ENCOUNTER — Telehealth: Payer: Self-pay

## 2022-03-10 NOTE — Telephone Encounter (Signed)
Received Medical Clearance form from [facility] for fillings, crowns and/or bridges.  Local anesthetic (with epinephrine). ? ?Placed form in Dr. Synthia Innocent box. ?

## 2022-03-12 DIAGNOSIS — Z0279 Encounter for issue of other medical certificate: Secondary | ICD-10-CM

## 2022-03-12 NOTE — Telephone Encounter (Signed)
Filled and in [name]'s box 

## 2022-03-12 NOTE — Telephone Encounter (Signed)
Faxed form. ? ?Attempted to contact pt.  Vm not set up.  Need to inform pt medical clearance form was faxed and there is a $20.00 fee to have it completed, which he will be billed for. ?

## 2022-03-22 ENCOUNTER — Ambulatory Visit: Payer: Medicare HMO | Admitting: Dermatology

## 2022-03-22 DIAGNOSIS — L820 Inflamed seborrheic keratosis: Secondary | ICD-10-CM

## 2022-03-22 DIAGNOSIS — L578 Other skin changes due to chronic exposure to nonionizing radiation: Secondary | ICD-10-CM | POA: Diagnosis not present

## 2022-03-22 DIAGNOSIS — Z872 Personal history of diseases of the skin and subcutaneous tissue: Secondary | ICD-10-CM

## 2022-03-22 DIAGNOSIS — L720 Epidermal cyst: Secondary | ICD-10-CM | POA: Diagnosis not present

## 2022-03-22 NOTE — Patient Instructions (Signed)
Cryotherapy Aftercare ? ?Wash gently with soap and water everyday.   ?Apply Vaseline and Band-Aid daily until healed.  ? ?Wound Care Instructions ? ?Cleanse wound gently with soap and water once a day then pat dry with clean gauze. Apply a thing coat of Petrolatum (petroleum jelly, "Vaseline") over the wound (unless you have an allergy to this). We recommend that you use a new, sterile tube of Vaseline. Do not pick or remove scabs. Do not remove the yellow or white "healing tissue" from the base of the wound. ? ?Cover the wound with fresh, clean, nonstick gauze and secure with paper tape. You may use Band-Aids in place of gauze and tape if the would is small enough, but would recommend trimming much of the tape off as there is often too much. Sometimes Band-Aids can irritate the skin. ? ?You should call the office for your biopsy report after 1 week if you have not already been contacted. ? ?If you experience any problems, such as abnormal amounts of bleeding, swelling, significant bruising, significant pain, or evidence of infection, please call the office immediately. ? ?FOR ADULT SURGERY PATIENTS: If you need something for pain relief you may take 1 extra strength Tylenol (acetaminophen) AND 2 Ibuprofen ('200mg'$  each) together every 4 hours as needed for pain. (do not take these if you are allergic to them or if you have a reason you should not take them.) Typically, you may only need pain medication for 1 to 3 days.  ? ? ?

## 2022-03-22 NOTE — Progress Notes (Signed)
? ?  Follow-Up Visit ?  ?Subjective  ?[name] is a 81 y.o. male who presents for the following: Actinic Keratosis (2 month follow up of left crown, vertex treated with LN2) and Follow-up (ISK 2 month follow up of crown treated with LN2). ?The patient has spots, moles and lesions to be evaluated, some may be new or changing and the patient has concerns that these could be cancer. ? ?The following portions of the chart were reviewed this encounter and updated as appropriate:  ? Tobacco  Allergies  Meds  Problems  Med Hx  Surg Hx  Fam Hx   ?  ?Review of Systems:  No other skin or systemic complaints except as noted in HPI or Assessment and Plan. ? ?Objective  ?Well appearing patient in no apparent distress; mood and affect are within normal limits. ? ?A focused examination was performed including face, scalp. Relevant physical exam findings are noted in the Assessment and Plan. ? ?Right cheek ?0.4 cm Subcutaneous nodule.  ? ?Scalp (4) ?Erythematous stuck-on, waxy papule or plaque ? ?Scalp ?Clear today ? ? ?Assessment & Plan  ?Epidermal inclusion cyst ?Right cheek ?Benign-appearing. Exam most consistent with an epidermal inclusion cyst. Discussed that a cyst is a benign growth that can grow over time and sometimes get irritated or inflamed. Recommend observation if it is not bothersome. Discussed option of surgical excision to remove it if it is growing, symptomatic, or other changes noted. Please call for new or changing lesions so they can be evaluated. ? ?Inflamed seborrheic keratosis (4) ?Scalp ?Destruction of lesion - Scalp ?Complexity: simple   ?Destruction method: cryotherapy   ?Informed consent: discussed and consent obtained   ?Timeout:  patient name, date of birth, surgical site, and procedure verified ?Lesion destroyed using liquid nitrogen: Yes   ?Region frozen until ice ball extended beyond lesion: Yes   ?Outcome: patient tolerated procedure well with no complications   ?Post-procedure  details: wound care instructions given   ? ?History of actinic keratoses ?Scalp ?Clear. Observe for recurrence. Call clinic for new or changing lesions.  Recommend regular skin exams, daily broad-spectrum spf 30+ sunscreen use, and photoprotection.   ? ?Actinic Damage ?- chronic, secondary to cumulative UV radiation exposure/sun exposure over time ?- diffuse scaly erythematous macules with underlying dyspigmentation ?- Recommend daily broad spectrum sunscreen SPF 30+ to sun-exposed areas, reapply every 2 hours as needed.  ?- Recommend staying in the shade or wearing long sleeves, sun glasses (UVA+UVB protection) and wide brim hats (4-inch brim around the entire circumference of the hat). ?- Call for new or changing lesions. ? ?Return in about 2 months (around 05/22/2022). ? ?I, Ashok Cordia, CMA, am acting as scribe for [name], MD . ?Documentation: I have reviewed the above documentation for accuracy and completeness, and I agree with the above. ? ?[name], MD ? ?

## 2022-03-23 ENCOUNTER — Encounter: Payer: Self-pay | Admitting: Dermatology

## 2022-03-25 ENCOUNTER — Telehealth: Payer: Self-pay

## 2022-03-25 NOTE — Telephone Encounter (Signed)
New message   [facility] needs clarification on surgery note unable to tell what was the list of medical conditions.   Fax # [phone]  Upcoming appt 04/06/22

## 2022-03-25 NOTE — Telephone Encounter (Addendum)
Faxed 'Problem List' to [facility] at [phone]. ?

## 2022-04-03 ENCOUNTER — Other Ambulatory Visit: Payer: Self-pay | Admitting: Family Medicine

## 2022-04-03 DIAGNOSIS — E782 Mixed hyperlipidemia: Secondary | ICD-10-CM

## 2022-04-03 DIAGNOSIS — R739 Hyperglycemia, unspecified: Secondary | ICD-10-CM

## 2022-04-03 DIAGNOSIS — N401 Enlarged prostate with lower urinary tract symptoms: Secondary | ICD-10-CM

## 2022-04-03 DIAGNOSIS — I10 Essential (primary) hypertension: Secondary | ICD-10-CM

## 2022-04-03 DIAGNOSIS — E559 Vitamin D deficiency, unspecified: Secondary | ICD-10-CM

## 2022-04-03 DIAGNOSIS — M818 Other osteoporosis without current pathological fracture: Secondary | ICD-10-CM

## 2022-04-06 ENCOUNTER — Other Ambulatory Visit (INDEPENDENT_AMBULATORY_CARE_PROVIDER_SITE_OTHER): Payer: Medicare HMO

## 2022-04-06 DIAGNOSIS — E559 Vitamin D deficiency, unspecified: Secondary | ICD-10-CM

## 2022-04-06 DIAGNOSIS — R739 Hyperglycemia, unspecified: Secondary | ICD-10-CM | POA: Diagnosis not present

## 2022-04-06 DIAGNOSIS — I10 Essential (primary) hypertension: Secondary | ICD-10-CM

## 2022-04-06 DIAGNOSIS — E782 Mixed hyperlipidemia: Secondary | ICD-10-CM | POA: Diagnosis not present

## 2022-04-06 DIAGNOSIS — M818 Other osteoporosis without current pathological fracture: Secondary | ICD-10-CM

## 2022-04-06 LAB — COMPREHENSIVE METABOLIC PANEL
ALT: 10 U/L (ref 0–53)
AST: 17 U/L (ref 0–37)
Albumin: 4.1 g/dL (ref 3.5–5.2)
Alkaline Phosphatase: 88 U/L (ref 39–117)
BUN: 25 mg/dL — ABNORMAL HIGH (ref 6–23)
CO2: 29 mEq/L (ref 19–32)
Calcium: 9 mg/dL (ref 8.4–10.5)
Chloride: 107 mEq/L (ref 96–112)
Creatinine, Ser: 1.31 mg/dL (ref 0.40–1.50)
GFR: 50.62 mL/min — ABNORMAL LOW (ref >60.00)
Glucose, Bld: 105 mg/dL — ABNORMAL HIGH (ref 70–99)
Potassium: 4.4 mEq/L (ref 3.5–5.1)
Sodium: 140 mEq/L (ref 135–145)
Total Bilirubin: 0.7 mg/dL (ref 0.2–1.2)
Total Protein: 6.8 g/dL (ref 6.0–8.3)

## 2022-04-06 LAB — LIPID PANEL
Cholesterol: 177 mg/dL (ref 0–200)
HDL: 47.1 mg/dL (ref >39.00)
LDL Cholesterol: 103 mg/dL — ABNORMAL HIGH (ref 0–99)
NonHDL: 129.69
Total CHOL/HDL Ratio: 4
Triglycerides: 132 mg/dL (ref 0.0–149.0)
VLDL: 26.4 mg/dL (ref 0.0–40.0)

## 2022-04-06 LAB — CBC WITH DIFFERENTIAL/PLATELET
Basophils Absolute: 0 10*3/uL (ref 0.0–0.1)
Basophils Relative: 0.8 % (ref 0.0–3.0)
Eosinophils Absolute: 0.1 10*3/uL (ref 0.0–0.7)
Eosinophils Relative: 1.8 % (ref 0.0–5.0)
HCT: 43 % (ref 39.0–52.0)
Hemoglobin: 14.1 g/dL (ref 13.0–17.0)
Lymphocytes Relative: 19.5 % (ref 12.0–46.0)
Lymphs Abs: 1.1 10*3/uL (ref 0.7–4.0)
MCHC: 32.9 g/dL (ref 30.0–36.0)
MCV: 90.2 fl (ref 78.0–100.0)
Monocytes Absolute: 0.6 10*3/uL (ref 0.1–1.0)
Monocytes Relative: 10.5 % (ref 3.0–12.0)
Neutro Abs: 3.9 10*3/uL (ref 1.4–7.7)
Neutrophils Relative %: 67.4 % (ref 43.0–77.0)
Platelets: 148 10*3/uL — ABNORMAL LOW (ref 150.0–400.0)
RBC: 4.76 Mil/uL (ref 4.22–5.81)
RDW: 13.6 % (ref 11.5–15.5)
WBC: 5.8 10*3/uL (ref 4.0–10.5)

## 2022-04-06 LAB — HEMOGLOBIN A1C: Hgb A1c MFr Bld: 5.7 % (ref 4.6–6.5)

## 2022-04-06 LAB — VITAMIN D 25 HYDROXY (VIT D DEFICIENCY, FRACTURES): VITD: 47.77 ng/mL (ref 30.00–100.00)

## 2022-04-06 LAB — MICROALBUMIN / CREATININE URINE RATIO
Creatinine,U: 130.8 mg/dL
Microalb Creat Ratio: 3.2 mg/g (ref 0.0–30.0)
Microalb, Ur: 4.1 mg/dL — ABNORMAL HIGH (ref 0.0–1.9)

## 2022-04-13 ENCOUNTER — Ambulatory Visit (INDEPENDENT_AMBULATORY_CARE_PROVIDER_SITE_OTHER): Payer: Medicare HMO | Admitting: Family Medicine

## 2022-04-13 ENCOUNTER — Encounter: Payer: Self-pay | Admitting: Family Medicine

## 2022-04-13 VITALS — BP 140/70 | HR 58 | Temp 98.2°F | Ht 66.0 in | Wt 149.0 lb

## 2022-04-13 DIAGNOSIS — R413 Other amnesia: Secondary | ICD-10-CM

## 2022-04-13 DIAGNOSIS — R972 Elevated prostate specific antigen [PSA]: Secondary | ICD-10-CM | POA: Diagnosis not present

## 2022-04-13 DIAGNOSIS — I700 Atherosclerosis of aorta: Secondary | ICD-10-CM

## 2022-04-13 DIAGNOSIS — E559 Vitamin D deficiency, unspecified: Secondary | ICD-10-CM | POA: Diagnosis not present

## 2022-04-13 DIAGNOSIS — R7303 Prediabetes: Secondary | ICD-10-CM | POA: Diagnosis not present

## 2022-04-13 DIAGNOSIS — R32 Unspecified urinary incontinence: Secondary | ICD-10-CM | POA: Insufficient documentation

## 2022-04-13 DIAGNOSIS — E041 Nontoxic single thyroid nodule: Secondary | ICD-10-CM

## 2022-04-13 DIAGNOSIS — N401 Enlarged prostate with lower urinary tract symptoms: Secondary | ICD-10-CM | POA: Diagnosis not present

## 2022-04-13 DIAGNOSIS — I493 Ventricular premature depolarization: Secondary | ICD-10-CM | POA: Diagnosis not present

## 2022-04-13 DIAGNOSIS — Z0000 Encounter for general adult medical examination without abnormal findings: Secondary | ICD-10-CM

## 2022-04-13 DIAGNOSIS — N138 Other obstructive and reflux uropathy: Secondary | ICD-10-CM

## 2022-04-13 DIAGNOSIS — I708 Atherosclerosis of other arteries: Secondary | ICD-10-CM

## 2022-04-13 DIAGNOSIS — N39490 Overflow incontinence: Secondary | ICD-10-CM

## 2022-04-13 DIAGNOSIS — M818 Other osteoporosis without current pathological fracture: Secondary | ICD-10-CM

## 2022-04-13 DIAGNOSIS — I10 Essential (primary) hypertension: Secondary | ICD-10-CM | POA: Diagnosis not present

## 2022-04-13 DIAGNOSIS — E782 Mixed hyperlipidemia: Secondary | ICD-10-CM

## 2022-04-13 MED ORDER — TAMSULOSIN HCL 0.4 MG PO CAPS: 0.4000 mg | ORAL_CAPSULE | Freq: Every day | ORAL | 6 refills | Status: DC

## 2022-04-13 MED ORDER — VITAMIN D3 25 MCG (1000 UT) PO CAPS: 1.0000 | ORAL_CAPSULE | Freq: Every day | ORAL | Status: AC

## 2022-04-13 NOTE — Assessment & Plan Note (Signed)
Saw urology s/p 3 normal biopsies, no further monitoring  ?

## 2022-04-13 NOTE — Assessment & Plan Note (Signed)
H/o this. Not on statin or aspirin.  ?

## 2022-04-13 NOTE — Assessment & Plan Note (Signed)
Mild, continue to monitor.  

## 2022-04-13 NOTE — Assessment & Plan Note (Signed)
Continues metoprolol succinate 12.'5mg'$  daily.  ?

## 2022-04-13 NOTE — Assessment & Plan Note (Signed)
Chronic, stable off medication. Encouraged healthy diet options to improve LDL cholesterol levels.  ?

## 2022-04-13 NOTE — Patient Instructions (Addendum)
Send [place] dates of shingles shots to update your chart.  ?Send me what vitamins you're taking.  ?Urinary questionnaire today.  ?Good to see you today. ?Return as needed or in 1 year for next physical.  ?Trial flomax at night time sent to pharmacy.  ?We will reorder bone density scan to [place]. Call to schedule this at your convenience: [facility] [phone]. ? ?Health Maintenance After Age 83 ?After age 9, you are at a higher risk for certain long-term diseases and infections as well as injuries from falls. Falls are a major cause of broken bones and head injuries in people who are older than age 59. Getting regular preventive care can help to keep you healthy and well. Preventive care includes getting regular testing and making lifestyle changes as recommended by your health care provider. Talk with your health care provider about: ?Which screenings and tests you should have. A screening is a test that checks for a disease when you have no symptoms. ?A diet and exercise plan that is right for you. ?What should I know about screenings and tests to prevent falls? ?Screening and testing are the best ways to find a health problem early. Early diagnosis and treatment give you the best chance of managing medical conditions that are common after age 61. Certain conditions and lifestyle choices may make you more likely to have a fall. Your health care provider may recommend: ?Regular vision checks. Poor vision and conditions such as cataracts can make you more likely to have a fall. If you wear glasses, make sure to get your prescription updated if your vision changes. ?Medicine review. Work with your health care provider to regularly review all of the medicines you are taking, including over-the-counter medicines. Ask your health care provider about any side effects that may make you more likely to have a fall. Tell your health care provider if any medicines that you take make you feel dizzy or  sleepy. ?Strength and balance checks. Your health care provider may recommend certain tests to check your strength and balance while standing, walking, or changing positions. ?Foot health exam. Foot pain and numbness, as well as not wearing proper footwear, can make you more likely to have a fall. ?Screenings, including: ?Osteoporosis screening. Osteoporosis is a condition that causes the bones to get weaker and break more easily. ?Blood pressure screening. Blood pressure changes and medicines to control blood pressure can make you feel dizzy. ?Depression screening. You may be more likely to have a fall if you have a fear of falling, feel depressed, or feel unable to do activities that you used to do. ?Alcohol use screening. Using too much alcohol can affect your balance and may make you more likely to have a fall. ?Follow these instructions at home: ?Lifestyle ?Do not drink alcohol if: ?Your health care provider tells you not to drink. ?If you drink alcohol: ?Limit how much you have to: ?0-1 drink a day for women. ?0-2 drinks a day for men. ?Know how much alcohol is in your drink. In the [place] one drink equals one 12 oz bottle of beer (355 mL), one 5 oz glass of wine (148 mL), or one 1? oz glass of hard liquor (44 mL). ?Do not use any products that contain nicotine or tobacco. These products include cigarettes, chewing tobacco, and vaping devices, such as e-cigarettes. If you need help quitting, ask your health care provider. ?Activity ? ?Follow a regular exercise program to stay fit. This will help  you maintain your balance. Ask your health care provider what types of exercise are appropriate for you. ?If you need a cane or walker, use it as recommended by your health care provider. ?Wear supportive shoes that have nonskid soles. ?Safety ? ?Remove any tripping hazards, such as rugs, cords, and clutter. ?Install safety equipment such as grab bars in bathrooms and safety rails on stairs. ?Keep rooms and walkways  well-lit. ?General instructions ?Talk with your health care provider about your risks for falling. Tell your health care provider if: ?You fall. Be sure to tell your health care provider about all falls, even ones that seem minor. ?You feel dizzy, tiredness (fatigue), or off-balance. ?Take over-the-counter and prescription medicines only as told by your health care provider. These include supplements. ?Eat a healthy diet and maintain a healthy weight. A healthy diet includes low-fat dairy products, low-fat (lean) meats, and fiber from whole grains, beans, and lots of fruits and vegetables. ?Stay current with your vaccines. ?Schedule regular health, dental, and eye exams. ?Summary ?Having a healthy lifestyle and getting preventive care can help to protect your health and wellness after age 21. ?Screening and testing are the best way to find a health problem early and help you avoid having a fall. Early diagnosis and treatment give you the best chance for managing medical conditions that are more common for people who are older than age 24. ?Falls are a major cause of broken bones and head injuries in people who are older than age 66. Take precautions to prevent a fall at home. ?Work with your health care provider to learn what changes you can make to improve your health and wellness and to prevent falls. ?This information is not intended to replace advice given to you by your health care provider. Make sure you discuss any questions you have with your health care provider. ?Document Revised: 04/20/2021 Document Reviewed: 04/20/2021 ?Elsevier Patient Education ? [place]. ? ?

## 2022-04-13 NOTE — Assessment & Plan Note (Signed)
Preventative protocols reviewed and updated unless pt declined. Discussed healthy diet and lifestyle.  

## 2022-04-13 NOTE — Assessment & Plan Note (Signed)
Chronic, stable period on low dose Toprol XL.  ?

## 2022-04-13 NOTE — Assessment & Plan Note (Signed)
Continues daily replacement with benefit.  ?

## 2022-04-13 NOTE — Assessment & Plan Note (Signed)
Possible small R thyroid nodule.  ?Pt asxs, no fmhx thyroid disease, no personal hx thyroid disease. ?Will monitor.  ?

## 2022-04-13 NOTE — Assessment & Plan Note (Signed)
H/o this. See below for urine incontinence.  ?Consider flomax /finasteride.  ?

## 2022-04-13 NOTE — Assessment & Plan Note (Addendum)
Doing better on daily vitamin D - continue.  ?He is getting good dietary calcium.  ?Update DEXA to [facility] per pt request.  ?

## 2022-04-13 NOTE — Progress Notes (Addendum)
? ? Patient ID: [name], male    DOB: 07/03/1941, 81 y.o.   MRN: [id number] ? ?This visit was conducted in person. ? ?BP 140/70   Pulse (!) 58   Temp 98.2 ?F (36.8 ?C) (Temporal)   Ht '5\' 6"'$  (1.676 m)   Wt 149 lb (67.6 kg)   SpO2 100%   BMI 24.05 kg/m?   ? ?CC: CPE ?Subjective:  ? ?HPI: ?[name] is a 81 y.o. male presenting on 04/13/2022 for Medicare Wellness ? ? ?Last seen 11/2020.  ? ?Saw health advisor 12/2021 for medicare wellness visit. Note reviewed.  ?Cognitive evaluation not assessed. ? ?Hearing Screening  ? '250Hz'$  '500Hz'$  '1000Hz'$  '2000Hz'$  '4000Hz'$   ?Right ear 20 40 40 20 0  ?Left ear 40 40 40 40 0  ?Vision Screening - Comments:: Pt has had eye exam at [facility] in the last year.   ?[place] Office Visit from 04/13/2022 in [facility] [place]  ?PHQ-2 Total Score 0  ? ?  ?  ? ?  12/16/2021  ? 12:59 PM 08/14/2019  ? 12:07 PM 08/09/2018  ? 12:28 PM 08/05/2017  ? 11:24 AM 05/21/2016  ?  2:27 PM  ?Fall Risk   ?Falls in the past year? 0 0 No No No  ?Number falls in past yr: 0      ?Injury with Fall? 0      ?Risk for fall due to : No Fall Risks      ?Follow up Falls prevention discussed Falls evaluation completed;Falls prevention discussed     ? ?Seldom ibuprofen use.  ? ?Preventative: ?Colon cancer screening - mod diverticulosis o/w WNL (2009). iFOB normal 08/2019.  ?Prostate cancer screening - saw urology (Dr [name]) for high PSA with 3 normal biopsies. nocturia x2-3.  ?DEXA (05/2018) T score -3.7 at [facility] ?Discussed rpt DEXA - will order.  ?Lung cancer screening - not eligible  ?Flu shot yearly  ?[place] 01/2020, 02/2020, bivalent 12/2021 ?Pneumovax 01/2011, prevnar-13 2015 ?Tdap 01/2011  ?zostavax - 2016 ?shingrix - 09/2019, 2nd done at pharmacy ?Advanced directive: scanned 04/2018. HCPOA are daughter Dreama Saa and granddaughter Ebony Hail. Wants life prolonging measures as reasonably possible within limits of generally accepted health standards.  ?Seat belt use  discussed ?Sunscreen use discussed. No changing moles on skin ?Non smoker ?Alcohol - none ?Dentist yearly  ?Eye exam yearly  ?Bowel - no constipation ?Bladder - some overflow incontinence - wears adult diaper  ? ?Lives with wife [name])  ?1 grown daughter ?Occupation: retired, worked in Public relations account executive for 30 yrs ?Edu: GED ?Activity: no regular exercise ?Diet: good water, fruits/vegetables daily. ?   ? ?Relevant past medical, surgical, family and social history reviewed and updated as indicated. Interim medical history since our last visit reviewed. ?Allergies and medications reviewed and updated. ?Outpatient Medications Prior to Visit  ?Medication Sig Dispense Refill  ? ibuprofen (ADVIL) 800 MG tablet Take 1 tablet (800 mg total) by mouth every 8 (eight) hours as needed for mild pain or moderate pain. 30 tablet 0  ? metoprolol succinate (TOPROL-XL) 25 MG 24 hr tablet TAKE 1/2 TABLET EVERY DAY 45 tablet 0  ? HYDROcodone-acetaminophen (NORCO) 5-325 MG tablet Take 1 tablet by mouth every 6 (six) hours as needed for up to 6 doses for moderate pain. 6 tablet 0  ? ?No facility-administered medications prior to visit.  ?  ? ?Per HPI unless specifically indicated in ROS section below ?Review of Systems  ?Constitutional:  Negative for activity change, appetite change, chills, fatigue, fever and unexpected weight change.  ?HENT:  Negative for hearing loss.   ?Eyes:  Negative for visual disturbance.  ?Respiratory:  Negative for cough, chest tightness, shortness of breath and wheezing.   ?Cardiovascular:  Negative for chest pain, palpitations and leg swelling.  ?Gastrointestinal:  Negative for abdominal distention, abdominal pain, blood in stool, constipation, diarrhea, nausea and vomiting.  ?Genitourinary:  Negative for difficulty urinating and hematuria.  ?Musculoskeletal:  Negative for arthralgias, myalgias and neck pain.  ?Skin:  Negative for rash.  ?Neurological:  Negative for dizziness, seizures, syncope and headaches.   ?Hematological:  Negative for adenopathy. Does not bruise/bleed easily.  ?Psychiatric/Behavioral:  Negative for dysphoric mood. The patient is not nervous/anxious.   ? ?Objective:  ?BP 140/70   Pulse (!) 58   Temp 98.2 ?F (36.8 ?C) (Temporal)   Ht '5\' 6"'$  (1.676 m)   Wt 149 lb (67.6 kg)   SpO2 100%   BMI 24.05 kg/m?   ?Wt Readings from Last 3 Encounters:  ?04/13/22 149 lb (67.6 kg)  ?12/16/21 145 lb (65.8 kg)  ?09/14/21 145 lb 8 oz (66 kg)  ?  ?  ?Physical Exam ?Vitals and nursing note reviewed.  ?Constitutional:   ?   General: He is not in acute distress. ?   Appearance: Normal appearance. He is well-developed. He is not ill-appearing.  ?HENT:  ?   Head: Normocephalic and atraumatic.  ?   Right Ear: Hearing, tympanic membrane, ear canal and external ear normal.  ?   Left Ear: Hearing, tympanic membrane, ear canal and external ear normal.  ?Eyes:  ?   General: No scleral icterus. ?   Extraocular Movements: Extraocular movements intact.  ?   Conjunctiva/sclera: Conjunctivae normal.  ?   Pupils: Pupils are equal, round, and reactive to light.  ?Neck:  ?   Thyroid: No thyroid mass or thyromegaly.  ?   Comments: Possible R thyroid nodule ?Cardiovascular:  ?   Rate and Rhythm: Normal rate and regular rhythm.  ?   Pulses: Normal pulses.     ?     Radial pulses are 2+ on the right side and 2+ on the left side.  ?   Heart sounds: Normal heart sounds. No murmur heard. ?Pulmonary:  ?   Effort: Pulmonary effort is normal. No respiratory distress.  ?   Breath sounds: Normal breath sounds. No wheezing, rhonchi or rales.  ?Abdominal:  ?   General: Bowel sounds are normal. There is no distension.  ?   Palpations: Abdomen is soft. There is no mass.  ?   Tenderness: There is no abdominal tenderness. There is no guarding or rebound.  ?   Hernia: No hernia is present.  ?Musculoskeletal:     ?   General: Normal range of motion.  ?   Cervical back: Normal range of motion and neck supple.  ?   Right lower leg: No edema.  ?   Left  lower leg: No edema.  ?Lymphadenopathy:  ?   Cervical: No cervical adenopathy.  ?Skin: ?   General: Skin is warm and dry.  ?   Findings: No rash.  ?Neurological:  ?   General: No focal deficit present.  ?   Mental Status: He is alert and oriented to person, place, and time.  ?   Comments:  ?Recall 1/3, 1/3 with cue ?Calculation 3/5 DLORW "not a good speller"  ?Psychiatric:     ?   Mood  and Affect: Mood normal.     ?   Behavior: Behavior normal.     ?   Thought Content: Thought content normal.     ?   Judgment: Judgment normal.  ? ?   ?Results for orders placed or performed in visit on 04/06/22  ?Microalbumin / creatinine urine ratio  ?Result Value Ref Range  ? Microalb, Ur 4.1 (H) 0.0 - 1.9 mg/dL  ? Creatinine,U 130.8 mg/dL  ? Microalb Creat Ratio 3.2 0.0 - 30.0 mg/g  ?CBC with Differential/Platelet  ?Result Value Ref Range  ? WBC 5.8 4.0 - 10.5 K/uL  ? RBC 4.76 4.22 - 5.81 Mil/uL  ? Hemoglobin 14.1 13.0 - 17.0 g/dL  ? HCT 43.0 39.0 - 52.0 %  ? MCV 90.2 78.0 - 100.0 fl  ? MCHC 32.9 30.0 - 36.0 g/dL  ? RDW 13.6 11.5 - 15.5 %  ? Platelets 148.0 (L) 150.0 - 400.0 K/uL  ? Neutrophils Relative % 67.4 43.0 - 77.0 %  ? Lymphocytes Relative 19.5 12.0 - 46.0 %  ? Monocytes Relative 10.5 3.0 - 12.0 %  ? Eosinophils Relative 1.8 0.0 - 5.0 %  ? Basophils Relative 0.8 0.0 - 3.0 %  ? Neutro Abs 3.9 1.4 - 7.7 K/uL  ? Lymphs Abs 1.1 0.7 - 4.0 K/uL  ? Monocytes Absolute 0.6 0.1 - 1.0 K/uL  ? Eosinophils Absolute 0.1 0.0 - 0.7 K/uL  ? Basophils Absolute 0.0 0.0 - 0.1 K/uL  ?Hemoglobin A1c  ?Result Value Ref Range  ? Hgb A1c MFr Bld 5.7 4.6 - 6.5 %  ?Lipid panel  ?Result Value Ref Range  ? Cholesterol 177 0 - 200 mg/dL  ? Triglycerides 132.0 0.0 - 149.0 mg/dL  ? HDL 47.10 >39.00 mg/dL  ? VLDL 26.4 0.0 - 40.0 mg/dL  ? LDL Cholesterol 103 (H) 0 - 99 mg/dL  ? Total CHOL/HDL Ratio 4   ? NonHDL 129.69   ?Comprehensive metabolic panel  ?Result Value Ref Range  ? Sodium 140 135 - 145 mEq/L  ? Potassium 4.4 3.5 - 5.1 mEq/L  ? Chloride 107 96  - 112 mEq/L  ? CO2 29 19 - 32 mEq/L  ? Glucose, Bld 105 (H) 70 - 99 mg/dL  ? BUN 25 (H) 6 - 23 mg/dL  ? Creatinine, Ser 1.31 0.40 - 1.50 mg/dL  ? Total Bilirubin 0.7 0.2 - 1.2 mg/dL  ? Alkaline Phosphatase 88 39 - 117 U/L

## 2022-04-13 NOTE — Assessment & Plan Note (Signed)
Difficulty with recall and calculation today.  ?Pt endorses h/o learning disability in grade school.  ?Overall not affecting function - will continue to monitor.  ?

## 2022-04-13 NOTE — Assessment & Plan Note (Addendum)
IPSS = 3-1. Not too bothersome.  ?Forgot UA - will need next visit.  ?Trial flomax - he doesn't remember effect when last tried.  ?

## 2022-04-13 NOTE — Assessment & Plan Note (Deleted)
Advanced directive: scanned 04/2018. HCPOA are daughter Dreama Saa and granddaughter Ebony Hail. Wants life prolonging measures as reasonably possible within limits of generally accepted health standards.  ?

## 2022-04-30 ENCOUNTER — Telehealth: Payer: Self-pay | Admitting: Family Medicine

## 2022-04-30 NOTE — Telephone Encounter (Signed)
Noted.  Fyi to Dr. G.  

## 2022-04-30 NOTE — Telephone Encounter (Signed)
Flomax was started earlier this month for urinary symptoms. Flomax is likely causing his dizziness symptoms.  Agree with stopping medication. Unfortunately all medicines in this family can cause a side effect. We had mentioned checking his urine to rule out other causes of urine symptoms but we forgot to do this at his office visit.  Would you be willing to come in for a urinalysis?  Another option we have is trying finasteride which is a slow acting medicine that overtime shrink the size of the prostate. But I'd want to check UA first.

## 2022-04-30 NOTE — Telephone Encounter (Signed)
Spoke with Nurse -Army Melia with access nurse and patient, they triaged patient and the verdict was to be seen within 24 hours, per patient Urgent care was not an option. We do not have anything open today at the time of the call. Patient is aware if his symptoms change over the weekend to seek medical care right away. Scheduled to see Dr Darnell Level on 05/03/22.  Fyi to Dr Leigh Aurora patient that if Dr Darnell Level has other feedback or suggestions we would call back. Access nurse note will be sent over shortly

## 2022-04-30 NOTE — Telephone Encounter (Signed)
See notes below access note. Sending to Dr Valinda Hoar CMA and will teams LIsa.

## 2022-04-30 NOTE — Telephone Encounter (Addendum)
Spoke with pt relaying Dr. Synthia Innocent message.  Pt verbalizes understanding and wants to wait until 05/03/22 OV to do UA and discuss trying finasteride.  Says he doesn't want to get out in the rain and afternoon traffic.

## 2022-04-30 NOTE — Telephone Encounter (Signed)
Pt called and wanted the nurse to know that this medication: tamsulosin (FLOMAX) 0.4 MG CAPS capsule is making him "light headed, he wants to still drive his car." Pt stated "he has stopped taking it at night time since yesterday." He also wants to know if there is another kind of medication he can take instead, please return the call when possible. I transferred him to Access Nurse.   Callback Number: [phone]

## 2022-04-30 NOTE — Telephone Encounter (Signed)
[facility] - Client TELEPHONE ADVICE RECORD [facility]urse Patient Name: [name] Gender: Male DOB: [birth year] Age: 81 Y 9 M 11 D Return Phone Number: [phone] (Primary) Address: City/ State/ Zip: [place] [address] Client [facility] - Client Client Site [place] Provider [name] - MD Contact Type Call Who Is Calling Patient / Member / Family / Caregiver Call Type Triage / Clinical Relationship To Patient Self Return Phone Number [phone] (Primary) Chief Complaint Dizziness Reason for Call Symptomatic / Request for [place] states she has a pt on the line that is on a certain medication that is making him lightheaded. He still wants to drive his car, but would like to know what else he can do for it. The rx is Tamsulosin flomax 0.4 mg. Translation No Nurse Assessment Nurse: Randall An, RN, [place] Date/Time (Eastern Time): 04/30/2022 12:03:14 PM Confirm and document reason for call. If symptomatic, describe symptoms. ---Caller states he is on a certain medication that is making him lightheaded. He still wants to drive his car, but would like to know what else he can do for it. The prescription is Tamsulosin Flomax(new). Takes half a "heart pill" daily. Symptoms started when he started the medication; he didn't take it yesterday and felt better. Does the patient have any new or worsening symptoms? ---Yes Will a triage be completed? ---Yes Related visit to physician within the last 2 weeks? ---No Does the PT have any chronic conditions? (i.e. diabetes, asthma, this includes High risk factors for pregnancy, etc.) ---Yes List chronic conditions. ---Heart murmur Is this a behavioral health or substance abuse call? ---No Guidelines Guideline Title Affirmed Question Affirmed Notes Nurse Date/Time (Eastern Time) Dizziness  - Lightheadedness [1] MODERATE dizziness (e.g., interferes with normal activities) Dunston, RN, [place] 04/30/2022 12:07:46 PM PLEASE NOTE: All timestamps contained within this report are represented as [place] Standard Time. CONFIDENTIALTY NOTICE: This fax transmission is intended only for the addressee. It contains information that is legally privileged, confidential or otherwise protected from use or disclosure. If you are not the intended recipient, you are strictly prohibited from reviewing, disclosing, copying using or disseminating any of this information or taking any action in reliance on or regarding this information. If you have received this fax in error, please notify us immediately by telephone so that we can arrange for its return to [place]. Phone: [phone], Toll-Free: [phone], Fax: [phone] Page: 2 of 2 Call Id: [id number] Guidelines Guideline Title Affirmed Question Affirmed Notes Nurse Date/Time Eilene Ghazi Time) AND [2] has NOT been evaluated by physician for this (Exception: dizziness caused by heat exposure, sudden standing, or poor fluid intake) Disp. Time Eilene Ghazi Time) Disposition Final User 04/30/2022 12:14:24 PM See PCP within 24 Hours Yes Dunston, RN, [place] Caller Disagree/Comply Disagree Caller Understands Yes PreDisposition Call Doctor Care Advice Given Per Guideline SEE PCP WITHIN 24 HOURS: * IF OFFICE WILL BE OPEN: You need to be examined within the next 24 hours. Call your doctor (or NP/PA) when the office opens and make an appointment. * Drink several glasses of fruit juice, other clear fluids or water. * Lie down with feet elevated for 1 hour. LIE DOWN AND REST: * You become worse * Passes out (faints) CALL BACK IF: CARE ADVICE given per Dizziness (Adult) guideline. * This will improve hydration and blood glucose. * This will improve circulation and increase blood flow to the brain. Comments User: Dewayne Shorter, RN  Date/Time Eilene Ghazi  Time): 04/30/2022 12:22:19 PM Pt warm transferred to office; no appointments today. Pt advsied UC would be next triage disposition for him and his symptoms so he could be seen in 24hrs. Pt states that is not something he would like to do. patient will make an appt for Monday to follow up with pcp. Referrals REFERRED TO PCP OFFIC

## 2022-05-03 ENCOUNTER — Ambulatory Visit (INDEPENDENT_AMBULATORY_CARE_PROVIDER_SITE_OTHER): Payer: Medicare HMO | Admitting: Family Medicine

## 2022-05-03 ENCOUNTER — Encounter: Payer: Self-pay | Admitting: Family Medicine

## 2022-05-03 VITALS — BP 134/76 | HR 66 | Temp 97.8°F | Ht 66.0 in | Wt 154.2 lb

## 2022-05-03 DIAGNOSIS — N138 Other obstructive and reflux uropathy: Secondary | ICD-10-CM

## 2022-05-03 DIAGNOSIS — N401 Enlarged prostate with lower urinary tract symptoms: Secondary | ICD-10-CM

## 2022-05-03 DIAGNOSIS — R42 Dizziness and giddiness: Secondary | ICD-10-CM | POA: Diagnosis not present

## 2022-05-03 DIAGNOSIS — N39490 Overflow incontinence: Secondary | ICD-10-CM | POA: Diagnosis not present

## 2022-05-03 LAB — POC URINALSYSI DIPSTICK (AUTOMATED)
Bilirubin, UA: NEGATIVE
Glucose, UA: NEGATIVE
Ketones, UA: NEGATIVE
Nitrite, UA: POSITIVE
Protein, UA: NEGATIVE
Spec Grav, UA: 1.025 (ref 1.010–1.025)
Urobilinogen, UA: 0.2 E.U./dL
pH, UA: 6 (ref 5.0–8.0)

## 2022-05-03 NOTE — Patient Instructions (Addendum)
Urinalysis today suspicious for urine infection.  We will await urine culture and be in touch with results.  Let us know sooner if new urinary symptoms develop.  Stay off flomax. I'm glad dizziness is better off this medicine.

## 2022-05-03 NOTE — Assessment & Plan Note (Addendum)
Describes lightheadedness related to flomax, now better off medication. Orthostatics normal in office today.  Will stay off alpha blocker.

## 2022-05-03 NOTE — Assessment & Plan Note (Signed)
H/o this, mild by IPSS of 3-1 last visit. Did not tolerate alpha blocker. Notes improvement with lower night time water intake. Desires to remain off medication at this time.

## 2022-05-03 NOTE — Progress Notes (Signed)
Patient ID: [name], male    DOB: Apr 03, 1941, 81 y.o.   MRN: [id number]  This visit was conducted in person.  BP 134/76   Pulse 66   Temp 97.8 F (36.6 C) (Temporal)   Ht '5\' 6"'$  (1.676 m)   Wt 154 lb 4 oz (70 kg)   SpO2 97%   BMI 24.90 kg/m   Orthostatic VS for the past 24 hrs (Last 3 readings):  BP- Lying BP- Standing at 3 minutes  05/03/22 1238 -- 142/70  05/03/22 1235 140/76 --     CC: dizziness Subjective:   HPI: [name] is a 81 y.o. male presenting on 05/03/2022 for Dizziness (C/o feeling lightheaded while taking Flomax.  Stopped Flomax, per Dr. Darnell Level.  Dizziness has ceased. )   He worries about his wife who may be exhibiting signs of dementia.   Flomax was started a few weeks ago due to urinary symptoms including overflow incontinence type symptoms (IPSS = 3-1).  However he noticed worsening lightheadedness on this medication so he stopped it last week.  Dizziness has since resolved.   Denies dysuria, frequency, urgency, hematuria, flank pain or suprapubic pain, nausea, vomiting, fever.   He's decreased water intake at night time and notes decrease in nocturia.   Lab Results  Component Value Date   PSA1 17.0 (H) 02/21/2020   PSA1 23.5 (H) 01/22/2020   PSA1 15.0 (H) 07/20/2019   PSA 19.23 (H) 07/20/2016   PSA 14.30 (H) 02/12/2014  S/p 3 benign prostate biopsies.      Relevant past medical, surgical, family and social history reviewed and updated as indicated. Interim medical history since our last visit reviewed. Allergies and medications reviewed and updated. Outpatient Medications Prior to Visit  Medication Sig Dispense Refill   Cholecalciferol (VITAMIN D3) 25 MCG (1000 UT) CAPS Take 1 capsule (1,000 Units total) by mouth daily. 30 capsule    ibuprofen (ADVIL) 800 MG tablet Take 1 tablet (800 mg total) by mouth every 8 (eight) hours as needed for mild pain or moderate pain. 30 tablet 0   metoprolol succinate (TOPROL-XL) 25 MG 24 hr tablet TAKE  1/2 TABLET EVERY DAY 45 tablet 0   tamsulosin (FLOMAX) 0.4 MG CAPS capsule Take 1 capsule (0.4 mg total) by mouth daily. (Patient not taking: Reported on 05/03/2022) 30 capsule 6   No facility-administered medications prior to visit.     Per HPI unless specifically indicated in ROS section below Review of Systems  Objective:  BP 134/76   Pulse 66   Temp 97.8 F (36.6 C) (Temporal)   Ht '5\' 6"'$  (1.676 m)   Wt 154 lb 4 oz (70 kg)   SpO2 97%   BMI 24.90 kg/m   Wt Readings from Last 3 Encounters:  05/03/22 154 lb 4 oz (70 kg)  04/13/22 149 lb (67.6 kg)  12/16/21 145 lb (65.8 kg)      Physical Exam Vitals and nursing note reviewed.  Constitutional:      Appearance: Normal appearance. He is not ill-appearing.  HENT:     Mouth/Throat:     Mouth: Mucous membranes are moist.     Pharynx: Oropharynx is clear. No oropharyngeal exudate or posterior oropharyngeal erythema.  Eyes:     Extraocular Movements: Extraocular movements intact.     Pupils: Pupils are equal, round, and reactive to light.  Cardiovascular:     Rate and Rhythm: Normal rate and regular rhythm.     Pulses: Normal pulses.  Heart sounds: Normal heart sounds. No murmur heard. Pulmonary:     Effort: Pulmonary effort is normal. No respiratory distress.     Breath sounds: Normal breath sounds. No wheezing, rhonchi or rales.  Musculoskeletal:     Right lower leg: No edema.     Left lower leg: No edema.  Skin:    General: Skin is warm and dry.     Findings: No rash.  Neurological:     Mental Status: He is alert.  Psychiatric:        Mood and Affect: Mood normal.        Behavior: Behavior normal.      Results for orders placed or performed in visit on 05/03/22  POCT Urinalysis Dipstick (Automated)  Result Value Ref Range   Color, UA yellow    Clarity, UA cloudy    Glucose, UA Negative Negative   Bilirubin, UA negative    Ketones, UA negative    Spec Grav, UA 1.025 1.010 - 1.025   Blood, UA +/-    pH, UA  6.0 5.0 - 8.0   Protein, UA Negative Negative   Urobilinogen, UA 0.2 0.2 or 1.0 E.U./dL   Nitrite, UA positive    Leukocytes, UA Large (3+) (A) Negative    Assessment & Plan:   Problem List Items Addressed This Visit     BPH with urinary obstruction    H/o this, mild by IPSS of 3-1 last visit. Did not tolerate alpha blocker. Notes improvement with lower night time water intake. Desires to remain off medication at this time.        Relevant Orders   POCT Urinalysis Dipstick (Automated) (Completed)   Dizziness - Primary    Describes lightheadedness related to flomax, now better off medication. Orthostatics normal in office today.  Will stay off alpha blocker.        Urinary incontinence    Notes improvement since cutting down on night time water.  UA/micro today suspicious for infection - sent UCx. As largely asymptomatic (no further incontinence episodes), pt prefers to wait for culture results prior to starting antibiotic treatment.        Relevant Orders   Urine Culture     No orders of the defined types were placed in this encounter.  Orders Placed This Encounter  Procedures   Urine Culture   POCT Urinalysis Dipstick (Automated)    Patient Instructions  Urinalysis today suspicious for urine infection.  We will await urine culture and be in touch with results.  Let us know sooner if new urinary symptoms develop.  Stay off flomax. I'm glad dizziness is better off this medicine.  Follow up plan: Return if symptoms worsen or fail to improve.  [name], MD

## 2022-05-03 NOTE — Assessment & Plan Note (Signed)
Notes improvement since cutting down on night time water.  UA/micro today suspicious for infection - sent UCx. As largely asymptomatic (no further incontinence episodes), pt prefers to wait for culture results prior to starting antibiotic treatment.

## 2022-05-05 LAB — URINE CULTURE
MICRO NUMBER:: 13427459
SPECIMEN QUALITY:: ADEQUATE

## 2022-05-06 ENCOUNTER — Other Ambulatory Visit: Payer: Self-pay | Admitting: Family Medicine

## 2022-05-06 MED ORDER — SULFAMETHOXAZOLE-TRIMETHOPRIM 800-160 MG PO TABS: 1.0000 | ORAL_TABLET | Freq: Two times a day (BID) | ORAL | 0 refills | Status: DC

## 2022-05-17 ENCOUNTER — Other Ambulatory Visit: Payer: Self-pay | Admitting: Family Medicine

## 2022-05-24 ENCOUNTER — Ambulatory Visit: Payer: Medicare HMO | Admitting: Dermatology

## 2022-05-24 DIAGNOSIS — Z85828 Personal history of other malignant neoplasm of skin: Secondary | ICD-10-CM | POA: Diagnosis not present

## 2022-05-24 DIAGNOSIS — L570 Actinic keratosis: Secondary | ICD-10-CM

## 2022-05-24 DIAGNOSIS — L820 Inflamed seborrheic keratosis: Secondary | ICD-10-CM | POA: Diagnosis not present

## 2022-05-24 DIAGNOSIS — L821 Other seborrheic keratosis: Secondary | ICD-10-CM

## 2022-05-24 DIAGNOSIS — L814 Other melanin hyperpigmentation: Secondary | ICD-10-CM | POA: Diagnosis not present

## 2022-05-24 DIAGNOSIS — L578 Other skin changes due to chronic exposure to nonionizing radiation: Secondary | ICD-10-CM

## 2022-05-24 NOTE — Progress Notes (Signed)
   Follow-Up Visit   Subjective  [name] is a 81 y.o. male who presents for the following: Sun exposed areas recheck. The patient has spots, moles and lesions to be evaluated, some may be new or changing and the patient has concerns that these could be cancer.  The following portions of the chart were reviewed this encounter and updated as appropriate:   Tobacco  Allergies  Meds  Problems  Med Hx  Surg Hx  Fam Hx     Review of Systems:  No other skin or systemic complaints except as noted in HPI or Assessment and Plan.  Objective  Well appearing patient in no apparent distress; mood and affect are within normal limits.  A focused examination was performed including the face, scalp, arms, hands. Relevant physical exam findings are noted in the Assessment and Plan.  Scalp x 2, forehead x 2 (4) Erythematous stuck-on, waxy papule or plaque  Forehead x 1 Erythematous thin papules/macules with gritty scale.    Assessment & Plan  Inflamed seborrheic keratosis (4) Scalp x 2, forehead x 2  Destruction of lesion - Scalp x 2, forehead x 2 Complexity: simple   Destruction method: cryotherapy   Informed consent: discussed and consent obtained   Timeout:  patient name, date of birth, surgical site, and procedure verified Lesion destroyed using liquid nitrogen: Yes   Region frozen until ice ball extended beyond lesion: Yes   Outcome: patient tolerated procedure well with no complications   Post-procedure details: wound care instructions given    AK (actinic keratosis) Forehead x 1  Destruction of lesion - Forehead x 1 Complexity: simple   Destruction method: cryotherapy   Informed consent: discussed and consent obtained   Timeout:  patient name, date of birth, surgical site, and procedure verified Lesion destroyed using liquid nitrogen: Yes   Region frozen until ice ball extended beyond lesion: Yes   Outcome: patient tolerated procedure well with no complications    Post-procedure details: wound care instructions given    Actinic Damage - chronic, secondary to cumulative UV radiation exposure/sun exposure over time - diffuse scaly erythematous macules with underlying dyspigmentation - Recommend daily broad spectrum sunscreen SPF 30+ to sun-exposed areas, reapply every 2 hours as needed.  - Recommend staying in the shade or wearing long sleeves, sun glasses (UVA+UVB protection) and wide brim hats (4-inch brim around the entire circumference of the hat). - Call for new or changing lesions.  Seborrheic Keratoses - Stuck-on, waxy, tan-brown papules and/or plaques  - Benign-appearing - Discussed benign etiology and prognosis. - Observe - Call for any changes  Lentigines - Scattered tan macules - Due to sun exposure - Benign-appering, observe - Recommend daily broad spectrum sunscreen SPF 30+ to sun-exposed areas, reapply every 2 hours as needed. - Call for any changes  History of Squamous Cell Carcinoma of the Skin - No evidence of recurrence today - No lymphadenopathy - Recommend regular full body skin exams - Recommend daily broad spectrum sunscreen SPF 30+ to sun-exposed areas, reapply every 2 hours as needed.  - Call if any new or changing lesions are noted between office visits  Return in about 6 months (around 11/23/2022) for UBSE.  Luther Redo, CMA, am acting as scribe for [name], MD . Documentation: I have reviewed the above documentation for accuracy and completeness, and I agree with the above.  [name], MD

## 2022-05-24 NOTE — Patient Instructions (Signed)
Due to recent changes in healthcare laws, you may see results of your pathology and/or laboratory studies on MyChart before the doctors have had a chance to review them. We understand that in some cases there may be results that are confusing or concerning to you. Please understand that not all results are received at the same time and often the doctors may need to interpret multiple results in order to provide you with the best plan of care or course of treatment. Therefore, we ask that you please give us 2 business days to thoroughly review all your results before contacting the office for clarification. Should we see a critical lab result, you will be contacted sooner.   If You Need Anything After Your Visit  If you have any questions or concerns for your doctor, please call our main line at [phone] and press option 4 to reach your doctor's medical assistant. If no one answers, please leave a voicemail as directed and we will return your call as soon as possible. Messages left after 4 pm will be answered the following business day.   You may also send us a message via MyChart. We typically respond to MyChart messages within 1-2 business days.  For prescription refills, please ask your pharmacy to contact our office. Our fax number is [phone].  If you have an urgent issue when the clinic is closed that cannot wait until the next business day, you can page your doctor at the number below.    Please note that while we do our best to be available for urgent issues outside of office hours, we are not available 24/7.   If you have an urgent issue and are unable to reach us, you may choose to seek medical care at your doctor's office, retail clinic, urgent care center, or emergency room.  If you have a medical emergency, please immediately call 911 or go to the emergency department.  Pager Numbers  - Dr. Kowalski: [phone]  - Dr. Moye: [phone]  - Dr. Stewart:  [phone]  In the event of inclement weather, please call our main line at [phone] for an update on the status of any delays or closures.  Dermatology Medication Tips: Please keep the boxes that topical medications come in in order to help keep track of the instructions about where and how to use these. Pharmacies typically print the medication instructions only on the boxes and not directly on the medication tubes.   If your medication is too expensive, please contact our office at [phone] option 4 or send us a message through MyChart.   We are unable to tell what your co-pay for medications will be in advance as this is different depending on your insurance coverage. However, we may be able to find a substitute medication at lower cost or fill out paperwork to get insurance to cover a needed medication.   If a prior authorization is required to get your medication covered by your insurance company, please allow us 1-2 business days to complete this process.  Drug prices often vary depending on where the prescription is filled and some pharmacies may offer cheaper prices.  The website www.goodrx.com contains coupons for medications through different pharmacies. The prices here do not account for what the cost may be with help from insurance (it may be cheaper with your insurance), but the website can give you the price if you did not use any insurance.  - You can print the associated coupon and take it with   your prescription to the pharmacy.  - You may also stop by our office during regular business hours and pick up a GoodRx coupon card.  - If you need your prescription sent electronically to a different pharmacy, notify our office through [facility] MyChart or by phone at [phone] option 4.     Si Usted Necesita Algo Despus de Su Visita  Tambin puede enviarnos un mensaje a travs de MyChart. Por lo general respondemos a los mensajes de MyChart en el transcurso de 1 a 2  das hbiles.  Para renovar recetas, por favor pida a su farmacia que se ponga en contacto con nuestra oficina. Nuestro nmero de fax es el [phone].  Si tiene un asunto urgente cuando la clnica est cerrada y que no puede esperar hasta el siguiente da hbil, puede llamar/localizar a su doctor(a) al nmero que aparece a continuacin.   Por favor, tenga en cuenta que aunque hacemos todo lo posible para estar disponibles para asuntos urgentes fuera del horario de oficina, no estamos disponibles las 24 horas del da, los 7 das de la semana.   Si tiene un problema urgente y no puede comunicarse con nosotros, puede optar por buscar atencin mdica  en el consultorio de su doctor(a), en una clnica privada, en un centro de atencin urgente o en una sala de emergencias.  Si tiene una emergencia mdica, por favor llame inmediatamente al 911 o vaya a la sala de emergencias.  Nmeros de bper  - Dr. Kowalski: [phone]  - Dra. Moye: [phone]  - Dra. Stewart: [phone]  En caso de inclemencias del tiempo, por favor llame a nuestra lnea principal al [phone] para una actualizacin sobre el estado de cualquier retraso o cierre.  Consejos para la medicacin en dermatologa: Por favor, guarde las cajas en las que vienen los medicamentos de uso tpico para ayudarle a seguir las instrucciones sobre dnde y cmo usarlos. Las farmacias generalmente imprimen las instrucciones del medicamento slo en las cajas y no directamente en los tubos del medicamento.   Si su medicamento es muy caro, por favor, pngase en contacto con nuestra oficina llamando al [phone] y presione la opcin 4 o envenos un mensaje a travs de MyChart.   No podemos decirle cul ser su copago por los medicamentos por adelantado ya que esto es diferente dependiendo de la cobertura de su seguro. Sin embargo, es posible que podamos encontrar un medicamento sustituto a menor costo o llenar un formulario para que el  seguro cubra el medicamento que se considera necesario.   Si se requiere una autorizacin previa para que su compaa de seguros cubra su medicamento, por favor permtanos de 1 a 2 das hbiles para completar este proceso.  Los precios de los medicamentos varan con frecuencia dependiendo del lugar de dnde se surte la receta y alguna farmacias pueden ofrecer precios ms baratos.  El sitio web www.goodrx.com tiene cupones para medicamentos de diferentes farmacias. Los precios aqu no tienen en cuenta lo que podra costar con la ayuda del seguro (puede ser ms barato con su seguro), pero el sitio web puede darle el precio si no utiliz ningn seguro.  - Puede imprimir el cupn correspondiente y llevarlo con su receta a la farmacia.  - Tambin puede pasar por nuestra oficina durante el horario de atencin regular y recoger una tarjeta de cupones de GoodRx.  - Si necesita que su receta se enve electrnicamente a una farmacia diferente, informe a nuestra oficina a travs de MyChart de [place] [facility]   o por telfono llamando al [phone] y presione la opcin 4.  

## 2022-05-25 ENCOUNTER — Encounter: Payer: Self-pay | Admitting: Dermatology

## 2022-07-05 ENCOUNTER — Other Ambulatory Visit: Payer: Self-pay | Admitting: Family Medicine

## 2022-07-05 ENCOUNTER — Telehealth: Payer: Self-pay

## 2022-07-26 ENCOUNTER — Telehealth: Payer: Self-pay

## 2022-08-31 ENCOUNTER — Ambulatory Visit (INDEPENDENT_AMBULATORY_CARE_PROVIDER_SITE_OTHER): Payer: Medicare HMO

## 2022-08-31 DIAGNOSIS — Z23 Encounter for immunization: Secondary | ICD-10-CM

## 2022-11-24 ENCOUNTER — Ambulatory Visit: Payer: Medicare HMO | Admitting: Dermatology

## 2022-11-24 VITALS — BP 115/69 | HR 53

## 2022-11-24 DIAGNOSIS — L821 Other seborrheic keratosis: Secondary | ICD-10-CM | POA: Diagnosis not present

## 2022-11-24 DIAGNOSIS — L570 Actinic keratosis: Secondary | ICD-10-CM | POA: Diagnosis not present

## 2022-11-24 DIAGNOSIS — L814 Other melanin hyperpigmentation: Secondary | ICD-10-CM

## 2022-11-24 DIAGNOSIS — L578 Other skin changes due to chronic exposure to nonionizing radiation: Secondary | ICD-10-CM

## 2022-11-24 DIAGNOSIS — L720 Epidermal cyst: Secondary | ICD-10-CM | POA: Diagnosis not present

## 2022-11-24 NOTE — Patient Instructions (Addendum)
Actinic keratoses are precancerous spots that appear secondary to cumulative UV radiation exposure/sun exposure over time. They are chronic with expected duration over 1 year. A portion of actinic keratoses will progress to squamous cell carcinoma of the skin. It is not possible to reliably predict which spots will progress to skin cancer and so treatment is recommended to prevent development of skin cancer.  Recommend daily broad spectrum sunscreen SPF 30+ to sun-exposed areas, reapply every 2 hours as needed.  Recommend staying in the shade or wearing long sleeves, sun glasses (UVA+UVB protection) and wide brim hats (4-inch brim around the entire circumference of the hat). Call for new or changing lesions.   Cryotherapy Aftercare  Wash gently with soap and water everyday.   Apply Vaseline and Band-Aid daily until healed.   Seborrheic Keratosis  What causes seborrheic keratoses? Seborrheic keratoses are harmless, common skin growths that first appear during adult life.  As time goes by, more growths appear.  Some people may develop a large number of them.  Seborrheic keratoses appear on both covered and uncovered body parts.  They are not caused by sunlight.  The tendency to develop seborrheic keratoses can be inherited.  They vary in color from skin-colored to gray, brown, or even black.  They can be either smooth or have a rough, warty surface.   Seborrheic keratoses are superficial and look as if they were stuck on the skin.  Under the microscope this type of keratosis looks like layers upon layers of skin.  That is why at times the top layer may seem to fall off, but the rest of the growth remains and re-grows.    Treatment Seborrheic keratoses do not need to be treated, but can easily be removed in the office.  Seborrheic keratoses often cause symptoms when they rub on clothing or jewelry.  Lesions can be in the way of shaving.  If they become inflamed, they can cause itching, soreness, or  burning.  Removal of a seborrheic keratosis can be accomplished by freezing, burning, or surgery. If any spot bleeds, scabs, or grows rapidly, please return to have it checked, as these can be an indication of a skin cancer.          Due to recent changes in healthcare laws, you may see results of your pathology and/or laboratory studies on MyChart before the doctors have had a chance to review them. We understand that in some cases there may be results that are confusing or concerning to you. Please understand that not all results are received at the same time and often the doctors may need to interpret multiple results in order to provide you with the best plan of care or course of treatment. Therefore, we ask that you please give us 2 business days to thoroughly review all your results before contacting the office for clarification. Should we see a critical lab result, you will be contacted sooner.   If You Need Anything After Your Visit  If you have any questions or concerns for your doctor, please call our main line at [phone] and press option 4 to reach your doctor's medical assistant. If no one answers, please leave a voicemail as directed and we will return your call as soon as possible. Messages left after 4 pm will be answered the following business day.   You may also send us a message via MyChart. We typically respond to MyChart messages within 1-2 business days.  For prescription refills, please ask your pharmacy   to contact our office. Our fax number is [phone].  If you have an urgent issue when the clinic is closed that cannot wait until the next business day, you can page your doctor at the number below.    Please note that while we do our best to be available for urgent issues outside of office hours, we are not available 24/7.   If you have an urgent issue and are unable to reach us, you may choose to seek medical care at your doctor's office, retail clinic,  urgent care center, or emergency room.  If you have a medical emergency, please immediately call 911 or go to the emergency department.  Pager Numbers  - Dr. Kowalski: [phone]  - Dr. Moye: [phone]  - Dr. Stewart: [phone]  In the event of inclement weather, please call our main line at [phone] for an update on the status of any delays or closures.  Dermatology Medication Tips: Please keep the boxes that topical medications come in in order to help keep track of the instructions about where and how to use these. Pharmacies typically print the medication instructions only on the boxes and not directly on the medication tubes.   If your medication is too expensive, please contact our office at [phone] option 4 or send us a message through MyChart.   We are unable to tell what your co-pay for medications will be in advance as this is different depending on your insurance coverage. However, we may be able to find a substitute medication at lower cost or fill out paperwork to get insurance to cover a needed medication.   If a prior authorization is required to get your medication covered by your insurance company, please allow us 1-2 business days to complete this process.  Drug prices often vary depending on where the prescription is filled and some pharmacies may offer cheaper prices.  The website www.goodrx.com contains coupons for medications through different pharmacies. The prices here do not account for what the cost may be with help from insurance (it may be cheaper with your insurance), but the website can give you the price if you did not use any insurance.  - You can print the associated coupon and take it with your prescription to the pharmacy.  - You may also stop by our office during regular business hours and pick up a GoodRx coupon card.  - If you need your prescription sent electronically to a different pharmacy, notify our office through [facility]  MyChart or by phone at [phone] option 4.     Si Usted Necesita Algo Despus de Su Visita  Tambin puede enviarnos un mensaje a travs de MyChart. Por lo general respondemos a los mensajes de MyChart en el transcurso de 1 a 2 das hbiles.  Para renovar recetas, por favor pida a su farmacia que se ponga en contacto con nuestra oficina. Nuestro nmero de fax es el [phone].  Si tiene un asunto urgente cuando la clnica est cerrada y que no puede esperar hasta el siguiente da hbil, puede llamar/localizar a su doctor(a) al nmero que aparece a continuacin.   Por favor, tenga en cuenta que aunque hacemos todo lo posible para estar disponibles para asuntos urgentes fuera del horario de oficina, no estamos disponibles las 24 horas del da, los 7 das de la semana.   Si tiene un problema urgente y no puede comunicarse con nosotros, puede optar por buscar atencin mdica  en el consultorio de su doctor(a), en una   clnica privada, en un centro de atencin urgente o en una sala de emergencias.  Si tiene una emergencia mdica, por favor llame inmediatamente al 911 o vaya a la sala de emergencias.  Nmeros de bper  - Dr. Kowalski: [phone]  - Dra. Moye: [phone]  - Dra. Stewart: [phone]  En caso de inclemencias del tiempo, por favor llame a nuestra lnea principal al [phone] para una actualizacin sobre el estado de cualquier retraso o cierre.  Consejos para la medicacin en dermatologa: Por favor, guarde las cajas en las que vienen los medicamentos de uso tpico para ayudarle a seguir las instrucciones sobre dnde y cmo usarlos. Las farmacias generalmente imprimen las instrucciones del medicamento slo en las cajas y no directamente en los tubos del medicamento.   Si su medicamento es muy caro, por favor, pngase en contacto con nuestra oficina llamando al [phone] y presione la opcin 4 o envenos un mensaje a travs de MyChart.   No podemos decirle cul  ser su copago por los medicamentos por adelantado ya que esto es diferente dependiendo de la cobertura de su seguro. Sin embargo, es posible que podamos encontrar un medicamento sustituto a menor costo o llenar un formulario para que el seguro cubra el medicamento que se considera necesario.   Si se requiere una autorizacin previa para que su compaa de seguros cubra su medicamento, por favor permtanos de 1 a 2 das hbiles para completar este proceso.  Los precios de los medicamentos varan con frecuencia dependiendo del lugar de dnde se surte la receta y alguna farmacias pueden ofrecer precios ms baratos.  El sitio web www.goodrx.com tiene cupones para medicamentos de diferentes farmacias. Los precios aqu no tienen en cuenta lo que podra costar con la ayuda del seguro (puede ser ms barato con su seguro), pero el sitio web puede darle el precio si no utiliz ningn seguro.  - Puede imprimir el cupn correspondiente y llevarlo con su receta a la farmacia.  - Tambin puede pasar por nuestra oficina durante el horario de atencin regular y recoger una tarjeta de cupones de GoodRx.  - Si necesita que su receta se enve electrnicamente a una farmacia diferente, informe a nuestra oficina a travs de MyChart de [place] o por telfono llamando al [phone] y presione la opcin 4.  

## 2022-11-24 NOTE — Progress Notes (Signed)
Follow-Up Visit   Subjective  [name] is a 81 y.o. male who presents for the following: Follow-up (6 month check hx of aks and isks. ). The patient has spots, moles and lesions to be evaluated, some may be new or changing and the patient has concerns that these could be cancer.  The following portions of the chart were reviewed this encounter and updated as appropriate:  Tobacco  Allergies  Meds  Problems  Med Hx  Surg Hx  Fam Hx     Review of Systems: No other skin or systemic complaints except as noted in HPI or Assessment and Plan.  Objective  Well appearing patient in no apparent distress; mood and affect are within normal limits.  A focused examination was performed including face, right cheek, scalp, forehead . Relevant physical exam findings are noted in the Assessment and Plan.  scalp x 1, forehead x 1, face x 1 (3) Erythematous thin papules/macules with gritty scale.   right medial cheek 0.5 cm Subcutaneous nodule.    Assessment & Plan  Actinic keratosis (3) scalp x 1, forehead x 1, face x 1 Actinic keratoses are precancerous spots that appear secondary to cumulative UV radiation exposure/sun exposure over time. They are chronic with expected duration over 1 year. A portion of actinic keratoses will progress to squamous cell carcinoma of the skin. It is not possible to reliably predict which spots will progress to skin cancer and so treatment is recommended to prevent development of skin cancer.  Recommend daily broad spectrum sunscreen SPF 30+ to sun-exposed areas, reapply every 2 hours as needed.  Recommend staying in the shade or wearing long sleeves, sun glasses (UVA+UVB protection) and wide brim hats (4-inch brim around the entire circumference of the hat). Call for new or changing lesions.  Destruction of lesion - scalp x 1, forehead x 1, face x 1 Complexity: simple   Destruction method: cryotherapy   Informed consent: discussed and consent  obtained   Timeout:  patient name, date of birth, surgical site, and procedure verified Lesion destroyed using liquid nitrogen: Yes   Region frozen until ice ball extended beyond lesion: Yes   Outcome: patient tolerated procedure well with no complications   Post-procedure details: wound care instructions given   Additional details:  Prior to procedure, discussed risks of blister formation, small wound, skin dyspigmentation, or rare scar following cryotherapy. Recommend Vaseline ointment to treated areas while healing.  Epidermal inclusion cyst right medial cheek Benign-appearing. Exam most consistent with an epidermal inclusion cyst. Discussed that a cyst is a benign growth that can grow over time and sometimes get irritated or inflamed. Recommend observation if it is not bothersome. Discussed option of surgical excision to remove it if it is growing, symptomatic, or other changes noted. Please call for new or changing lesions so they can be evaluated.  Seborrheic Keratoses - Stuck-on, waxy, tan-brown papules and/or plaques  - Benign-appearing - Discussed benign etiology and prognosis. - Observe - Call for any changes  Lentigines - Scattered tan macules - Due to sun exposure - Benign-appearing, observe - Recommend daily broad spectrum sunscreen SPF 30+ to sun-exposed areas, reapply every 2 hours as needed. - Call for any changes  Actinic Damage - chronic, secondary to cumulative UV radiation exposure/sun exposure over time - diffuse scaly erythematous macules with underlying dyspigmentation - Recommend daily broad spectrum sunscreen SPF 30+ to sun-exposed areas, reapply every 2 hours as needed.  - Recommend staying in the shade or wearing  long sleeves, sun glasses (UVA+UVB protection) and wide brim hats (4-inch brim around the entire circumference of the hat). - Call for new or changing lesions.  Return in about 1 year (around 11/25/2023) for ubse .  IRuthell Rummage, CMA, am  acting as scribe for [name], MD. Documentation: I have reviewed the above documentation for accuracy and completeness, and I agree with the above.  [name], MD

## 2022-12-09 ENCOUNTER — Encounter: Payer: Self-pay | Admitting: Dermatology

## 2022-12-14 ENCOUNTER — Telehealth: Payer: Self-pay | Admitting: Family Medicine

## 2022-12-14 NOTE — Telephone Encounter (Signed)
Patient notified as instructed by telephone and verbalized understanding. 

## 2022-12-14 NOTE — Telephone Encounter (Signed)
I think it would be reasonable for him to get the latest COVID vaccine booster.

## 2022-12-14 NOTE — Telephone Encounter (Signed)
Pt called asking if Dr. Darnell Level recommend getting the new covid booster? Pt is requesting a call back with Dr. Synthia Innocent response. Called back # [phone]

## 2022-12-20 ENCOUNTER — Telehealth: Payer: Self-pay

## 2022-12-20 DIAGNOSIS — Z0100 Encounter for examination of eyes and vision without abnormal findings: Secondary | ICD-10-CM | POA: Diagnosis not present

## 2022-12-20 DIAGNOSIS — Z961 Presence of intraocular lens: Secondary | ICD-10-CM | POA: Diagnosis not present

## 2022-12-20 NOTE — Telephone Encounter (Signed)
Unable to reach patient for scheduled AWV. No answer. Left messages. Okay to reschedule.

## 2023-03-08 ENCOUNTER — Telehealth: Payer: Self-pay | Admitting: Family Medicine

## 2023-03-08 NOTE — Telephone Encounter (Signed)
Called patient to schedule Medicare Annual Wellness Visit (AWV). Left message for patient to call back and schedule Medicare Annual Wellness Visit (AWV).  Last date of AWV: 12/16/2021  Please schedule an appointment at any time with [facility] .  If any questions, please contact me at [phone].  Thank you ,  [place] Direct Dial: [phone]

## 2023-03-09 ENCOUNTER — Telehealth: Payer: Self-pay | Admitting: Family Medicine

## 2023-03-09 NOTE — Telephone Encounter (Signed)
Contacted [name] to schedule their annual wellness visit. Appointment made for 03/30/2023.  [place] Direct Dial: [phone]

## 2023-03-15 DIAGNOSIS — L708 Other acne: Secondary | ICD-10-CM | POA: Diagnosis not present

## 2023-03-15 DIAGNOSIS — H00015 Hordeolum externum left lower eyelid: Secondary | ICD-10-CM | POA: Diagnosis not present

## 2023-03-15 DIAGNOSIS — X32XXXA Exposure to sunlight, initial encounter: Secondary | ICD-10-CM | POA: Diagnosis not present

## 2023-03-15 DIAGNOSIS — D225 Melanocytic nevi of trunk: Secondary | ICD-10-CM | POA: Diagnosis not present

## 2023-03-15 DIAGNOSIS — D2262 Melanocytic nevi of left upper limb, including shoulder: Secondary | ICD-10-CM | POA: Diagnosis not present

## 2023-03-15 DIAGNOSIS — L570 Actinic keratosis: Secondary | ICD-10-CM | POA: Diagnosis not present

## 2023-03-15 DIAGNOSIS — D2261 Melanocytic nevi of right upper limb, including shoulder: Secondary | ICD-10-CM | POA: Diagnosis not present

## 2023-03-15 DIAGNOSIS — L821 Other seborrheic keratosis: Secondary | ICD-10-CM | POA: Diagnosis not present

## 2023-03-30 ENCOUNTER — Ambulatory Visit (INDEPENDENT_AMBULATORY_CARE_PROVIDER_SITE_OTHER): Payer: Medicare HMO

## 2023-03-30 VITALS — Ht 67.0 in | Wt 154.0 lb

## 2023-03-30 DIAGNOSIS — Z0000 Encounter for general adult medical examination without abnormal findings: Secondary | ICD-10-CM

## 2023-03-30 NOTE — Progress Notes (Signed)
I connected with  Stanford Strauch on 03/30/23 by a audio enabled telemedicine application and verified that I am speaking with the correct person using two identifiers.  Patient Location: Home  Provider Location: Office/Clinic  I discussed the limitations of evaluation and management by telemedicine. The patient expressed understanding and agreed to proceed.  Subjective:   [name] is a 82 y.o. male who presents for Medicare Annual/Subsequent preventive examination.  Review of Systems      Cardiac Risk Factors include: advanced age (>55men, >34 women);hypertension;male gender;sedentary lifestyle     Objective:    Today's Vitals   03/30/23 1504  Weight: 154 lb (69.9 kg)  Height: 5\' 7"  (1.702 m)   Body mass index is 24.12 kg/m.     03/30/2023    3:15 PM 12/16/2021   12:56 PM 05/29/2021    6:39 AM 05/21/2021    3:52 PM 04/13/2021    2:48 PM 12/03/2020   12:14 PM 11/20/2020    1:00 PM  Advanced Directives  Does Patient Have a Medical Advance Directive? Yes Yes Yes No No Yes Yes  Type of Estate agent of [place];Living will Healthcare Power of [place];Living will Healthcare Power of [place] Power of Attorney  Does patient want to make changes to medical advance directive? No - Patient declined Yes (MAU/Ambulatory/Procedural Areas - Information given)     No - Patient declined  Copy of Healthcare Power of Attorney in Chart? Yes - validated most recent copy scanned in chart (See row information) Yes - validated most recent copy scanned in chart (See row information) No - copy requested      Would patient like information on creating a medical advance directive?    No - Patient declined No - Patient declined      Current Medications (verified) Outpatient Encounter Medications as of 03/30/2023  Medication Sig   Cholecalciferol (VITAMIN D3) 25 MCG (1000 UT) CAPS Take 1 capsule (1,000 Units total) by mouth daily.   ibuprofen (ADVIL) 800 MG  tablet Take 1 tablet (800 mg total) by mouth every 8 (eight) hours as needed for mild pain or moderate pain.   metoprolol succinate (TOPROL-XL) 25 MG 24 hr tablet TAKE 1/2 TABLET EVERY DAY   sulfamethoxazole-trimethoprim (BACTRIM DS) 800-160 MG tablet Take 1 tablet by mouth 2 (two) times daily. (Patient not taking: Reported on 05/24/2022)   No facility-administered encounter medications on file as of 03/30/2023.    Allergies (verified) Patient has no known allergies.   History: Past Medical History:  Diagnosis Date   Actinic keratosis    BPH with urinary obstruction    sees urology Dr. Apolinar Junes   Cardiac murmur    grade II/VI mid-systolic; "blowing" and lower LSB   Carotid stenosis 07/20/2016   Minimal on [place] 07/2016. Monitor clinically.   Complication of anesthesia    (+) postoperative delirium; "hallucinations"   Depression with anxiety    Elevated PSA 2014   sees urology - normal biopsies x 3   HLD (hyperlipidemia)    Left kidney mass 09/2014   on CT scan thought consistent with simple cyst on prior US   Nasal obstruction    congenital R sided   Obstructive pyelonephritis 09/2014   s/p hospitalization and treatment with levaquin   OSA (obstructive sleep apnea)    pt denies   Osteoporosis 06/04/2018   DEXA T score -37 at L spine   Paroxysmal atrial fibrillation    Recurrent kidney stones 2014  extensive bilateral nephrolithiasis, sees urology Marlou Porch), hydrocodone prn   SOB (shortness of breath)    Squamous cell carcinoma of skin 07/25/2019   crown scalp   Squamous cell carcinoma of skin 06/29/2017   SCC IS ant to crown post   Symptomatic PVCs    per prior cardiologist, on beta blocker   Urge incontinence    thought overactive bladder   Past Surgical History:  Procedure Laterality Date   CARDIAC CATHETERIZATION  03/2012   EF 45-50%, no LVH, + impaired LV relaxation, no PH, no valve abnormalities   CARDIOVASCULAR STRESS TEST  03/2012   mild peri infarct ischemia in  apical segment, decreased EF   CATARACT EXTRACTION Right 01/2013   COLONOSCOPY  02/2008   mod diverticulosis o/w WNL (2009)   CYSTOSCOPY  10/2014   s/p stone removal, with stent removal Edwyna Shell)   CYSTOSCOPY WITH STENT PLACEMENT Right 11/18/2020   Procedure: CYSTOSCOPY WITH STENT PLACEMENT;  Surgeon: Riki Altes, MD;  Location: [facility];  Service: Urology;  Laterality: Right;   CYSTOSCOPY/URETEROSCOPY/HOLMIUM LASER/STENT PLACEMENT Left 08/11/2020   Procedure: CYSTOSCOPY/URETEROSCOPY/STENT PLACEMENT;  Surgeon: Vanna Scotland, MD;  Location: [facility];  Service: Urology;  Laterality: Left;   CYSTOSCOPY/URETEROSCOPY/HOLMIUM LASER/STENT PLACEMENT N/A 08/28/2020   Procedure: CYSTOSCOPY/URETEROSCOPY/HOLMIUM LASER/STENT exchange;  Surgeon: Vanna Scotland, MD;  Location: [facility];  Service: Urology;  Laterality: N/A;   CYSTOSCOPY/URETEROSCOPY/HOLMIUM LASER/STENT PLACEMENT Right 12/09/2020   Procedure: CYSTOSCOPY/URETEROSCOPY/HOLMIUM LASER/STENT Exchange;  Surgeon: Riki Altes, MD;  Location: [facility];  Service: Urology;  Laterality: Right;   HOLEP-LASER ENUCLEATION OF THE PROSTATE WITH MORCELLATION N/A 08/11/2020   Procedure: HOLEP-LASER ENUCLEATION OF THE PROSTATE WITH MORCELLATION;  Surgeon: Vanna Scotland, MD;  Location: [facility];  Service: Urology;  Laterality: N/A;   TONSILECTOMY, ADENOIDECTOMY, BILATERAL MYRINGOTOMY AND TUBES     removed as a child   US ECHOCARDIOGRAPHY  03/2012   hypokinetic anterior wall, EF 45-50%, impaired relaxation pattern, mild LA dilation, mild-mod MR, mild-mod AR   Family History  Problem Relation Age of Onset   Kidney Stones Mother    CAD Father    Kidney Stones Daughter    Diabetes Neg Hx    Cancer Neg Hx    Stroke Neg Hx    Hypertension Neg Hx    Prostate cancer Neg Hx    Kidney cancer Neg Hx    Bladder Cancer Neg Hx    Social History   Socioeconomic History   Marital status: Married    Spouse name: Not on file   Number of children: Not on  file   Years of education: Not on file   Highest education level: Not on file  Occupational History   Not on file  Tobacco Use   Smoking status: Never   Smokeless tobacco: Never  Vaping Use   Vaping Use: Never used  Substance and Sexual Activity   Alcohol use: No   Drug use: No   Sexual activity: Not Currently  Other Topics Concern   Not on file  Social History Narrative   Lives with wife Lurena Joiner)    1 grown daughter   Occupation: retired, worked in Retail buyer for 30 yrs   Edu: GED   Activity: walking some   Diet: good water, fruits/vegetables daily.      Advanced directives: daughter is HCPOA for now   Social Determinants of Health   Financial Resource Strain: Low Risk  (03/30/2023)   Overall Financial Resource Strain (CARDIA)    Difficulty of Paying Living Expenses:  Not hard at all  Food Insecurity: No Food Insecurity (03/30/2023)   Hunger Vital Sign    Worried About Running Out of Food in the Last Year: Never true    Ran Out of Food in the Last Year: Never true  Transportation Needs: No Transportation Needs (03/30/2023)   PRAPARE - Administrator, Civil Service (Medical): No    Lack of Transportation (Non-Medical): No  Physical Activity: Inactive (03/30/2023)   Exercise Vital Sign    Days of Exercise per Week: 0 days    Minutes of Exercise per Session: 0 min  Stress: No Stress Concern Present (03/30/2023)   [employer] of Occupational Health - Occupational Stress Questionnaire    Feeling of Stress : Not at all  Social Connections: Moderately Isolated (03/30/2023)   Social Connection and Isolation Panel [NHANES]    Frequency of Communication with Friends and Family: More than three times a week    Frequency of Social Gatherings with Friends and Family: More than three times a week    Attends Religious Services: Never    Database administrator or Organizations: No    Attends Engineer, structural: Never    Marital Status: Married     Tobacco Counseling Counseling given: Not Answered   Clinical Intake:  Pre-visit preparation completed: Yes  Pain : No/denies pain     Nutritional Risks: None Diabetes: No  How often do you need to have someone help you when you read instructions, pamphlets, or other written materials from your doctor or pharmacy?: 1 - Never  Diabetic?no   Interpreter Needed?: No  Information entered by :: C.Duilio Heritage LPN   Activities of Daily Living    03/30/2023    3:15 PM  In your present state of health, do you have any difficulty performing the following activities:  Hearing? 0  Vision? 1  Difficulty concentrating or making decisions? 1  Comment occasionlally forgets  Walking or climbing stairs? 0  Dressing or bathing? 0  Doing errands, shopping? 0  Preparing Food and eating ? N  Using the Toilet? N  In the past six months, have you accidently leaked urine? Y  Comment occasionally followed by Urologist  Do you have problems with loss of bowel control? N  Managing your Medications? N  Managing your Finances? N  Housekeeping or managing your Housekeeping? N    Patient Care Team: Eustaquio Boyden, MD as PCP - General (Family Medicine) Blair Promise, OD as Referring Physician (Optometry)  Indicate any recent Medical Services you may have received from other than [facility] providers in the past year (date may be approximate).     Assessment:   This is a routine wellness examination for [name].  Hearing/Vision screen Hearing Screening - Comments:: No aids Vision Screening - Comments:: Glasses - [facility]  Dietary issues and exercise activities discussed: Current Exercise Habits: The patient does not participate in regular exercise at present, Exercise limited by: None identified   Goals Addressed             This Visit's Progress    Patient Stated       Maintain current health status       Depression Screen    03/30/2023    3:14 PM 04/13/2022    3:36 PM  12/16/2021    1:00 PM 08/14/2019   12:07 PM 08/09/2018   12:28 PM 08/05/2017   12:24 PM 05/21/2016    2:27 PM  PHQ 2/9 Scores  PHQ -  2 Score 0 0 0 0 0 1 0  PHQ- 9 Score  0  0 0      Fall Risk    03/30/2023    3:09 PM 12/16/2021   12:59 PM 08/14/2019   12:07 PM 08/09/2018   12:28 PM 08/05/2017   11:24 AM  Fall Risk   Falls in the past year? 0 0 0 No No  Number falls in past yr: 0 0     Injury with Fall? 0 0     Risk for fall due to : No Fall Risks No Fall Risks     Follow up Falls prevention discussed;Falls evaluation completed Falls prevention discussed Falls evaluation completed;Falls prevention discussed      FALL RISK PREVENTION PERTAINING TO THE HOME:  Any stairs in or around the home? Yes  If so, are there any without handrails? No  Home free of loose throw rugs in walkways, pet beds, electrical cords, etc? Yes  Adequate lighting in your home to reduce risk of falls? Yes   ASSISTIVE DEVICES UTILIZED TO PREVENT FALLS:  Life alert? No  Use of a cane, walker or w/c? No  Grab bars in the bathroom? Yes  Shower chair or bench in shower? No  Elevated toilet seat or a handicapped toilet? No    Cognitive Function:    08/14/2019   12:12 PM 08/09/2018   12:27 PM 05/21/2016    1:26 PM  MMSE - Mini Mental State Exam  Orientation to time 5 5 5   Orientation to Place 5 5 5   Registration 3 3 3   Attention/ Calculation 3 0 0  Recall 3 3 3   Language- name 2 objects 0 0 0  Language- repeat 1 1 1   Language- follow 3 step command 0 3 3  Language- read & follow direction 0 0 0  Write a sentence 0 0 0  Copy design 0 0 0  Total score 20 20 20         03/30/2023    3:18 PM  6CIT Screen  What Year? 0 points  What month? 0 points  What time? 0 points  Count back from 20 2 points  Months in reverse 4 points  Repeat phrase 10 points  Total Score 16 points    Immunizations Immunization History  Administered Date(s) Administered   Fluad Quad(high Dose 65+) 08/21/2019, 09/14/2021,  08/31/2022   Influenza Whole 10/13/2013   Influenza, High Dose Seasonal PF 10/07/2020   Influenza,inj,Quad PF,6+ Mos 08/26/2015, 08/25/2016, 09/01/2017, 08/11/2018   PFIZER(Purple Top)SARS-COV-2 Vaccination 01/18/2020, 02/12/2020   Pfizer Covid-19 Vaccine Bivalent Booster 8yrs & up 12/15/2021   Pneumococcal Conjugate-13 02/19/2014   Pneumococcal Polysaccharide-23 01/18/2011, 11/22/2020   Tdap 01/18/2011   Zoster Recombinat (Shingrix) 10/03/2019   Zoster, Live 06/23/2015    TDAP status: Due, Education has been provided regarding the importance of this vaccine. Advised may receive this vaccine at local pharmacy or Health Dept. Aware to provide a copy of the vaccination record if obtained from local pharmacy or Health Dept. Verbalized acceptance and understanding.  Flu Vaccine status: Up to date  Pneumococcal vaccine status: Up to date  Covid-19 vaccine status: Declined, Education has been provided regarding the importance of this vaccine but patient still declined. Advised may receive this vaccine at local pharmacy or Health Dept.or vaccine clinic. Aware to provide a copy of the vaccination record if obtained from local pharmacy or Health Dept. Verbalized acceptance and understanding.  Qualifies for Shingles Vaccine? Yes   Zostavax completed  Yes   Shingrix Completed?: Yes  Screening Tests Health Maintenance  Topic Date Due   Zoster Vaccines- Shingrix (2 of 2) 11/28/2019   DTaP/Tdap/Td (2 - Td or Tdap) 01/18/2021   COVID-19 Vaccine (4 - 2023-24 season) 08/13/2022   INFLUENZA VACCINE  07/14/2023   Medicare Annual Wellness (AWV)  03/29/2024   Pneumonia Vaccine 87+ Years old  Completed   HPV VACCINES  Aged Out    Health Maintenance  Health Maintenance Due  Topic Date Due   Zoster Vaccines- Shingrix (2 of 2) 11/28/2019   DTaP/Tdap/Td (2 - Td or Tdap) 01/18/2021   COVID-19 Vaccine (4 - 2023-24 season) 08/13/2022    Colorectal cancer screening: No longer required.   Lung  Cancer Screening: (Low Dose CT Chest recommended if Age 67-80 years, 30 pack-year currently smoking OR have quit w/in 15years.) does not qualify.   Lung Cancer Screening Referral: no  Additional Screening:  Hepatitis C Screening: does not qualify; Completed no  Vision Screening: Recommended annual ophthalmology exams for early detection of glaucoma and other disorders of the eye. Is the patient up to date with their annual eye exam?  Yes  Who is the provider or what is the name of the office in which the patient attends annual eye exams? [facility] If pt is not established with a provider, would they like to be referred to a provider to establish care? No .   Dental Screening: Recommended annual dental exams for proper oral hygiene  Community Resource Referral / Chronic Care Management: CRR required this visit?  No   CCM required this visit?  No      Plan:     I have personally reviewed and noted the following in the patient's chart:   Medical and social history Use of alcohol, tobacco or illicit drugs  Current medications and supplements including opioid prescriptions. Patient is not currently taking opioid prescriptions. Functional ability and status Nutritional status Physical activity Advanced directives List of other physicians Hospitalizations, surgeries, and ER visits in previous 12 months Vitals Screenings to include cognitive, depression, and falls Referrals and appointments  In addition, I have reviewed and discussed with patient certain preventive protocols, quality metrics, and best practice recommendations. A written personalized care plan for preventive services as well as general preventive health recommendations were provided to patient.     Maryan Puls, LPN   1/61/0960   Nurse Notes: Pt 6 CIT score is 16. Pt unable to recall there year, months, nor the address given.

## 2023-03-30 NOTE — Patient Instructions (Signed)
[name] , Thank you for taking time to come for your Medicare Wellness Visit. I appreciate your ongoing commitment to your health goals. Please review the following plan we discussed and let me know if I can assist you in the future.   These are the goals we discussed:  Goals      Patient Stated     Starting 08/09/2018, I will continue to medications as prescribed.      Patient Stated     08/14/2019, Wants to eat healthier     Patient Stated     Would like to maintain current routine     Patient Stated     Maintain current health status        This is a list of the screening recommended for you and due dates:  Health Maintenance  Topic Date Due   Zoster (Shingles) Vaccine (2 of 2) 11/28/2019   DTaP/Tdap/Td vaccine (2 - Td or Tdap) 01/18/2021   COVID-19 Vaccine (4 - 2023-24 season) 08/13/2022   Flu Shot  07/14/2023   Medicare Annual Wellness Visit  03/29/2024   Pneumonia Vaccine  Completed   HPV Vaccine  Aged Out    Advanced directives: copy is in the chart.  Conditions/risks identified: Aim for 30 minutes of exercise or brisk walking, 6-8 glasses of water, and 5 servings of fruits and vegetables each day.   Next appointment: Follow up in one year for your annual wellness visit. 04/02/24 @ 3pm televisit.  Preventive Care 71 Years and Older, Male  Preventive care refers to lifestyle choices and visits with your health care provider that can promote health and wellness. What does preventive care include? A yearly physical exam. This is also called an annual well check. Dental exams once or twice a year. Routine eye exams. Ask your health care provider how often you should have your eyes checked. Personal lifestyle choices, including: Daily care of your teeth and gums. Regular physical activity. Eating a healthy diet. Avoiding tobacco and drug use. Limiting alcohol use. Practicing safe sex. Taking low doses of aspirin every day. Taking vitamin and mineral supplements  as recommended by your health care provider. What happens during an annual well check? The services and screenings done by your health care provider during your annual well check will depend on your age, overall health, lifestyle risk factors, and family history of disease. Counseling  Your health care provider may ask you questions about your: Alcohol use. Tobacco use. Drug use. Emotional well-being. Home and relationship well-being. Sexual activity. Eating habits. History of falls. Memory and ability to understand (cognition). Work and work Astronomer. Screening  You may have the following tests or measurements: Height, weight, and BMI. Blood pressure. Lipid and cholesterol levels. These may be checked every 5 years, or more frequently if you are over 19 years old. Skin check. Lung cancer screening. You may have this screening every year starting at age 33 if you have a 30-pack-year history of smoking and currently smoke or have quit within the past 15 years. Fecal occult blood test (FOBT) of the stool. You may have this test every year starting at age 23. Flexible sigmoidoscopy or colonoscopy. You may have a sigmoidoscopy every 5 years or a colonoscopy every 10 years starting at age 51. Prostate cancer screening. Recommendations will vary depending on your family history and other risks. Hepatitis C blood test. Hepatitis B blood test. Sexually transmitted disease (STD) testing. Diabetes screening. This is done by checking your blood sugar (glucose)  after you have not eaten for a while (fasting). You may have this done every 1-3 years. Abdominal aortic aneurysm (AAA) screening. You may need this if you are a current or former smoker. Osteoporosis. You may be screened starting at age 49 if you are at high risk. Talk with your health care provider about your test results, treatment options, and if necessary, the need for more tests. Vaccines  Your health care provider may recommend  certain vaccines, such as: Influenza vaccine. This is recommended every year. Tetanus, diphtheria, and acellular pertussis (Tdap, Td) vaccine. You may need a Td booster every 10 years. Zoster vaccine. You may need this after age 2. Pneumococcal 13-valent conjugate (PCV13) vaccine. One dose is recommended after age 92. Pneumococcal polysaccharide (PPSV23) vaccine. One dose is recommended after age 62. Talk to your health care provider about which screenings and vaccines you need and how often you need them. This information is not intended to replace advice given to you by your health care provider. Make sure you discuss any questions you have with your health care provider. Document Released: 12/26/2015 Document Revised: 08/18/2016 Document Reviewed: 09/30/2015 Elsevier Interactive Patient Education  2017 [employer].  Fall Prevention in the Home Falls can cause injuries. They can happen to people of all ages. There are many things you can do to make your home safe and to help prevent falls. What can I do on the outside of my home? Regularly fix the edges of walkways and driveways and fix any cracks. Remove anything that might make you trip as you walk through a door, such as a raised step or threshold. Trim any bushes or trees on the path to your home. Use bright outdoor lighting. Clear any walking paths of anything that might make someone trip, such as rocks or tools. Regularly check to see if handrails are loose or broken. Make sure that both sides of any steps have handrails. Any raised decks and porches should have guardrails on the edges. Have any leaves, snow, or ice cleared regularly. Use sand or salt on walking paths during winter. Clean up any spills in your garage right away. This includes oil or grease spills. What can I do in the bathroom? Use night lights. Install grab bars by the toilet and in the tub and shower. Do not use towel bars as grab bars. Use non-skid mats or  decals in the tub or shower. If you need to sit down in the shower, use a plastic, non-slip stool. Keep the floor dry. Clean up any water that spills on the floor as soon as it happens. Remove soap buildup in the tub or shower regularly. Attach bath mats securely with double-sided non-slip rug tape. Do not have throw rugs and other things on the floor that can make you trip. What can I do in the bedroom? Use night lights. Make sure that you have a light by your bed that is easy to reach. Do not use any sheets or blankets that are too big for your bed. They should not hang down onto the floor. Have a firm chair that has side arms. You can use this for support while you get dressed. Do not have throw rugs and other things on the floor that can make you trip. What can I do in the kitchen? Clean up any spills right away. Avoid walking on wet floors. Keep items that you use a lot in easy-to-reach places. If you need to reach something above you, use a  strong step stool that has a grab bar. Keep electrical cords out of the way. Do not use floor polish or wax that makes floors slippery. If you must use wax, use non-skid floor wax. Do not have throw rugs and other things on the floor that can make you trip. What can I do with my stairs? Do not leave any items on the stairs. Make sure that there are handrails on both sides of the stairs and use them. Fix handrails that are broken or loose. Make sure that handrails are as long as the stairways. Check any carpeting to make sure that it is firmly attached to the stairs. Fix any carpet that is loose or worn. Avoid having throw rugs at the top or bottom of the stairs. If you do have throw rugs, attach them to the floor with carpet tape. Make sure that you have a light switch at the top of the stairs and the bottom of the stairs. If you do not have them, ask someone to add them for you. What else can I do to help prevent falls? Wear shoes that: Do not  have high heels. Have rubber bottoms. Are comfortable and fit you well. Are closed at the toe. Do not wear sandals. If you use a stepladder: Make sure that it is fully opened. Do not climb a closed stepladder. Make sure that both sides of the stepladder are locked into place. Ask someone to hold it for you, if possible. Clearly mark and make sure that you can see: Any grab bars or handrails. First and last steps. Where the edge of each step is. Use tools that help you move around (mobility aids) if they are needed. These include: Canes. Walkers. Scooters. Crutches. Turn on the lights when you go into a dark area. Replace any light bulbs as soon as they burn out. Set up your furniture so you have a clear path. Avoid moving your furniture around. If any of your floors are uneven, fix them. If there are any pets around you, be aware of where they are. Review your medicines with your doctor. Some medicines can make you feel dizzy. This can increase your chance of falling. Ask your doctor what other things that you can do to help prevent falls. This information is not intended to replace advice given to you by your health care provider. Make sure you discuss any questions you have with your health care provider. Document Released: 09/25/2009 Document Revised: 05/06/2016 Document Reviewed: 01/03/2015 Elsevier Interactive Patient Education  2017 [employer].

## 2023-04-13 DIAGNOSIS — L739 Follicular disorder, unspecified: Secondary | ICD-10-CM | POA: Diagnosis not present

## 2023-04-24 ENCOUNTER — Other Ambulatory Visit: Payer: Self-pay | Admitting: Family Medicine

## 2023-04-24 DIAGNOSIS — I10 Essential (primary) hypertension: Secondary | ICD-10-CM

## 2023-04-28 DIAGNOSIS — H11222 Conjunctival granuloma, left eye: Secondary | ICD-10-CM | POA: Diagnosis not present

## 2023-04-28 DIAGNOSIS — L739 Follicular disorder, unspecified: Secondary | ICD-10-CM | POA: Diagnosis not present

## 2023-05-06 ENCOUNTER — Encounter: Payer: Self-pay | Admitting: Family Medicine

## 2023-05-06 ENCOUNTER — Ambulatory Visit (INDEPENDENT_AMBULATORY_CARE_PROVIDER_SITE_OTHER): Payer: Medicare HMO | Admitting: Family Medicine

## 2023-05-06 VITALS — BP 126/68 | HR 60 | Temp 97.5°F | Ht 64.5 in | Wt 156.0 lb

## 2023-05-06 DIAGNOSIS — Z0000 Encounter for general adult medical examination without abnormal findings: Secondary | ICD-10-CM

## 2023-05-06 DIAGNOSIS — R7303 Prediabetes: Secondary | ICD-10-CM | POA: Diagnosis not present

## 2023-05-06 DIAGNOSIS — M818 Other osteoporosis without current pathological fracture: Secondary | ICD-10-CM

## 2023-05-06 DIAGNOSIS — R7989 Other specified abnormal findings of blood chemistry: Secondary | ICD-10-CM | POA: Diagnosis not present

## 2023-05-06 DIAGNOSIS — Z7189 Other specified counseling: Secondary | ICD-10-CM

## 2023-05-06 DIAGNOSIS — E782 Mixed hyperlipidemia: Secondary | ICD-10-CM | POA: Diagnosis not present

## 2023-05-06 DIAGNOSIS — Z87442 Personal history of urinary calculi: Secondary | ICD-10-CM

## 2023-05-06 DIAGNOSIS — I10 Essential (primary) hypertension: Secondary | ICD-10-CM | POA: Diagnosis not present

## 2023-05-06 DIAGNOSIS — R413 Other amnesia: Secondary | ICD-10-CM | POA: Diagnosis not present

## 2023-05-06 DIAGNOSIS — E041 Nontoxic single thyroid nodule: Secondary | ICD-10-CM

## 2023-05-06 DIAGNOSIS — E559 Vitamin D deficiency, unspecified: Secondary | ICD-10-CM

## 2023-05-06 DIAGNOSIS — I708 Atherosclerosis of other arteries: Secondary | ICD-10-CM

## 2023-05-06 DIAGNOSIS — I700 Atherosclerosis of aorta: Secondary | ICD-10-CM

## 2023-05-06 LAB — CBC WITH DIFFERENTIAL/PLATELET
Eosinophils Relative: 3 %
HCT: 45.7 % (ref 38.5–50.0)
Hemoglobin: 15.4 g/dL (ref 13.2–17.1)
Monocytes Relative: 10.5 %
Neutro Abs: 4278 cells/uL (ref 1500–7800)
Neutrophils Relative %: 62 %
Platelets: 163 10*3/uL (ref 140–400)
RDW: 12.4 % (ref 11.0–15.0)
Total Lymphocyte: 23.8 %

## 2023-05-06 MED ORDER — METOPROLOL SUCCINATE ER 25 MG PO TB24: 12.5000 mg | ORAL_TABLET | Freq: Every day | ORAL | 4 refills | Status: DC

## 2023-05-06 NOTE — Assessment & Plan Note (Signed)
Previously discussed.

## 2023-05-06 NOTE — Patient Instructions (Addendum)
Labs today  I will order repeat bone density scan to monitor osteoporosis.  Continue good calcium in diet, continue vitamin D daily.  Find out date of 2nd shingles shot.  Return in 6 months for follow up visit  Reviewed 4 core lifestyle modifications to support a healthy mind:  1. Nutritious well balance diet.  2. Regular physical activity routine.  3. Regular mental activity such as reading books, word puzzles, math puzzles, jigsaw puzzles.  4. Social engagement.  Also ensure good blood pressure control, limit alcohol, no smoking.

## 2023-05-06 NOTE — Progress Notes (Unsigned)
Ph: [phone] Fax: [phone]   Patient ID: [name], male    DOB: 1941/05/29, 82 y.o.   MRN: [id number]  This visit was conducted in person.  BP 126/68   Pulse 60   Temp (!) 97.5 F (36.4 C) (Temporal)   Ht 5' 4.5" (1.638 m)   Wt 156 lb (70.8 kg)   SpO2 97%   BMI 26.36 kg/m    CC: CPE Subjective:   HPI: [name] is a 82 y.o. male presenting on 05/06/2023 for Annual Exam [facility] prt 2 [AWV-03/30/23].)   Saw health advisor 03/2023 for medicare wellness visit. Note reviewed. Failed cognitive assessment.     03/30/2023    3:18 PM  6CIT Screen  What Year? 0 points  What month? 0 points  What time? 0 points  Count back from 20 2 points  Months in reverse 4 points  Repeat phrase 10 points  Total Score 16 points  He denies significant change in memory function.  Continues driving without difficulty.  Some forgetful of names.   No results found.  Flowsheet Row Clinical Support from 03/30/2023 in [facility] [place]  PHQ-2 Total Score 0          03/30/2023    3:09 PM 12/16/2021   12:59 PM 08/14/2019   12:07 PM 08/09/2018   12:28 PM 08/05/2017   11:24 AM  Fall Risk   Falls in the past year? 0 0 0 No No  Number falls in past yr: 0 0     Injury with Fall? 0 0     Risk for fall due to : No Fall Risks No Fall Risks     Follow up Falls prevention discussed;Falls evaluation completed Falls prevention discussed Falls evaluation completed;Falls prevention discussed      MMSE - 23/30 CDT - 2/4  Preventative: Colon cancer screening - mod diverticulosis o/w WNL (2009). iFOB normal 08/2019. No blood in stool or bowel changes Prostate cancer screening - saw urology (Dr Apolinar Junes) for high PSA with 3 normal biopsies. Nocturia x2-3.  DEXA (05/2018) T score -3.7 at [facility] Will order rpt DEXA.  Good calcium in diet, continues vit D 1000 iu daily.  Lung cancer screening - not eligible  Flu shot yearly  COVID vaccine Pfizer 01/2020, 02/2020,  bivalent 12/2021 Pneumovax 01/2011, prevnar-13 2015 Tdap 01/2011  zostavax - 2016 shingrix - 09/2019, 2nd done at pharmacy Advanced directive: scanned 04/2018. HCPOA are daughter Carrington Clamp and granddaughter Denzil Hughes. Wants life prolonging measures as reasonably possible within limits of generally accepted health standards.  Seat belt use discussed Sunscreen use discussed. No changing moles on skin. Sees dermatologist year.  Non smoker Alcohol - none Dentist yearly  Eye exam yearly  Bowel - no constipation  Bladder - no incontinence    Lives with wife Lurena Joiner)  1 grown daughter Occupation: retired, worked in Retail buyer for 30 yrs Edu: GED Activity: no regular exercise Diet: good water, fruits/vegetables daily.     Relevant past medical, surgical, family and social history reviewed and updated as indicated. Interim medical history since our last visit reviewed. Allergies and medications reviewed and updated. Outpatient Medications Prior to Visit  Medication Sig Dispense Refill   Cholecalciferol (VITAMIN D3) 25 MCG (1000 UT) CAPS Take 1 capsule (1,000 Units total) by mouth daily. 30 capsule    metoprolol succinate (TOPROL-XL) 25 MG 24 hr tablet TAKE 1/2 TABLET EVERY DAY 45 tablet 0   ibuprofen (ADVIL)  800 MG tablet Take 1 tablet (800 mg total) by mouth every 8 (eight) hours as needed for mild pain or moderate pain. 30 tablet 0   sulfamethoxazole-trimethoprim (BACTRIM DS) 800-160 MG tablet Take 1 tablet by mouth 2 (two) times daily. (Patient not taking: Reported on 05/24/2022) 14 tablet 0   No facility-administered medications prior to visit.     Per HPI unless specifically indicated in ROS section below Review of Systems  Constitutional:  Negative for activity change, appetite change, chills, fatigue, fever and unexpected weight change.  HENT:  Negative for hearing loss.   Eyes:  Negative for visual disturbance.  Respiratory:  Negative for cough, chest tightness,  shortness of breath and wheezing.   Cardiovascular:  Negative for chest pain, palpitations and leg swelling.  Gastrointestinal:  Negative for abdominal distention, abdominal pain, blood in stool, constipation, diarrhea, nausea and vomiting.  Genitourinary:  Negative for difficulty urinating and hematuria.  Musculoskeletal:  Negative for arthralgias, myalgias and neck pain.  Skin:  Negative for rash.  Neurological:  Negative for dizziness, seizures, syncope and headaches.  Hematological:  Negative for adenopathy. Does not bruise/bleed easily.  Psychiatric/Behavioral:  Negative for dysphoric mood. The patient is not nervous/anxious.     Objective:  BP 126/68   Pulse 60   Temp (!) 97.5 F (36.4 C) (Temporal)   Ht 5' 4.5" (1.638 m)   Wt 156 lb (70.8 kg)   SpO2 97%   BMI 26.36 kg/m   Wt Readings from Last 3 Encounters:  05/06/23 156 lb (70.8 kg)  03/30/23 154 lb (69.9 kg)  05/03/22 154 lb 4 oz (70 kg)      Physical Exam Vitals and nursing note reviewed.  Constitutional:      General: He is not in acute distress.    Appearance: Normal appearance. He is well-developed. He is not ill-appearing.  HENT:     Head: Normocephalic and atraumatic.     Right Ear: Hearing, tympanic membrane, ear canal and external ear normal.     Left Ear: Hearing, tympanic membrane, ear canal and external ear normal.     Nose: Nose normal.     Mouth/Throat:     Mouth: Mucous membranes are moist.     Pharynx: Oropharynx is clear. No oropharyngeal exudate or posterior oropharyngeal erythema.  Eyes:     General: No scleral icterus.    Extraocular Movements: Extraocular movements intact.     Conjunctiva/sclera: Conjunctivae normal.     Pupils: Pupils are equal, round, and reactive to light.  Neck:     Thyroid: No thyroid mass or thyromegaly.     Vascular: No carotid bruit.  Cardiovascular:     Rate and Rhythm: Normal rate and regular rhythm.     Pulses: Normal pulses.          Radial pulses are 2+ on  the right side and 2+ on the left side.     Heart sounds: Normal heart sounds. No murmur heard. Pulmonary:     Effort: Pulmonary effort is normal. No respiratory distress.     Breath sounds: Normal breath sounds. No wheezing, rhonchi or rales.  Abdominal:     General: Bowel sounds are normal. There is no distension.     Palpations: Abdomen is soft. There is no mass.     Tenderness: There is no abdominal tenderness. There is no guarding or rebound.     Hernia: No hernia is present.  Musculoskeletal:        General: Normal range  of motion.     Cervical back: Normal range of motion and neck supple.     Right lower leg: No edema.     Left lower leg: No edema.  Lymphadenopathy:     Cervical: No cervical adenopathy.  Skin:    General: Skin is warm and dry.     Findings: No rash.  Neurological:     General: No focal deficit present.     Mental Status: He is alert and oriented to person, place, and time.  Psychiatric:        Mood and Affect: Mood normal.        Behavior: Behavior normal.        Thought Content: Thought content normal.        Judgment: Judgment normal.       Results for orders placed or performed in visit on 05/03/22  Urine Culture   Specimen: Urine  Result Value Ref Range   MICRO NUMBER: [id number]    SPECIMEN QUALITY: Adequate    Sample Source NOT GIVEN    STATUS: FINAL    ISOLATE 1: Klebsiella pneumoniae (A)       Susceptibility   Klebsiella pneumoniae - URINE CULTURE, REFLEX    AMOX/CLAVULANIC <=2 Sensitive     AMPICILLIN >=32 Resistant     AMPICILLIN/SULBACTAM 8 Sensitive     CEFAZOLIN* <=4 Not Reportable      * For infections other than uncomplicated UTI caused by E. coli, K. pneumoniae or P. mirabilis: Cefazolin is resistant if MIC > or = 8 mcg/mL. (Distinguishing susceptible versus intermediate for isolates with MIC < or = 4 mcg/mL requires additional testing.) For uncomplicated UTI caused by E. coli, K. pneumoniae or P. mirabilis: Cefazolin  is susceptible if MIC <32 mcg/mL and predicts susceptible to the oral agents cefaclor, cefdinir, cefpodoxime, cefprozil, cefuroxime, cephalexin and loracarbef.     CEFTAZIDIME <=1 Sensitive     CEFEPIME <=1 Sensitive     CEFTRIAXONE <=1 Sensitive     CIPROFLOXACIN <=0.25 Sensitive     LEVOFLOXACIN <=0.12 Sensitive     GENTAMICIN <=1 Sensitive     IMIPENEM <=0.25 Sensitive     NITROFURANTOIN 64 Intermediate     PIP/TAZO <=4 Sensitive     TOBRAMYCIN <=1 Sensitive     TRIMETH/SULFA* <=20 Sensitive      * For infections other than uncomplicated UTI caused by E. coli, K. pneumoniae or P. mirabilis: Cefazolin is resistant if MIC > or = 8 mcg/mL. (Distinguishing susceptible versus intermediate for isolates with MIC < or = 4 mcg/mL requires additional testing.) For uncomplicated UTI caused by E. coli, K. pneumoniae or P. mirabilis: Cefazolin is susceptible if MIC <32 mcg/mL and predicts susceptible to the oral agents cefaclor, cefdinir, cefpodoxime, cefprozil, cefuroxime, cephalexin and loracarbef. Legend: S = Susceptible  I = Intermediate R = Resistant  NS = Not susceptible * = Not tested  NR = Not reported **NN = See antimicrobic comments   POCT Urinalysis Dipstick (Automated)  Result Value Ref Range   Color, UA yellow    Clarity, UA cloudy    Glucose, UA Negative Negative   Bilirubin, UA negative    Ketones, UA negative    Spec Grav, UA 1.025 1.010 - 1.025   Blood, UA +/-    pH, UA 6.0 5.0 - 8.0   Protein, UA Negative Negative   Urobilinogen, UA 0.2 0.2 or 1.0 E.U./dL   Nitrite, UA positive    Leukocytes, UA Large (3+) (A) Negative  Lab Results  Component Value Date   WBC 5.8 04/06/2022   HGB 14.1 04/06/2022   HCT 43.0 04/06/2022   MCV 90.2 04/06/2022   PLT 148.0 (L) 04/06/2022    Lab Results  Component Value Date   CREATININE 1.31 04/06/2022   BUN 25 (H) 04/06/2022   NA 140 04/06/2022   K 4.4 04/06/2022   CL 107 04/06/2022   CO2 29 04/06/2022     Assessment & Plan:   Problem List Items Addressed This Visit     Prediabetes   Relevant Orders   Hemoglobin A1c   Health maintenance examination - Primary (Chronic)    Preventative protocols reviewed and updated unless pt declined. Discussed healthy diet and lifestyle.       Advanced care planning/counseling discussion (Chronic)    Previously discussed      Osteoporosis   Relevant Orders   VITAMIN D 25 Hydroxy (Vit-D Deficiency, Fractures)   DG Bone Density   Elevated parathyroid hormone   Relevant Orders   CBC with Differential/Platelet   Parathyroid hormone, intact (no Ca)   HLD (hyperlipidemia)   Relevant Medications   metoprolol succinate (TOPROL-XL) 25 MG 24 hr tablet   Other Relevant Orders   Lipid panel   Comprehensive metabolic panel   Hypertension   Relevant Medications   metoprolol succinate (TOPROL-XL) 25 MG 24 hr tablet   Vitamin D deficiency   Relevant Orders   VITAMIN D 25 Hydroxy (Vit-D Deficiency, Fractures)   Memory impairment   Relevant Orders   Vitamin B12   RPR   Thyroid nodule   Relevant Medications   metoprolol succinate (TOPROL-XL) 25 MG 24 hr tablet   Other Relevant Orders   TSH     Meds ordered this encounter  Medications   metoprolol succinate (TOPROL-XL) 25 MG 24 hr tablet    Sig: Take 0.5 tablets (12.5 mg total) by mouth daily.    Dispense:  45 tablet    Refill:  4    Orders Placed This Encounter  Procedures   DG Bone Density    Standing Status:   Future    Standing Expiration Date:   05/05/2024    Scheduling Instructions:     Pt requests [place]. Last done 2019 with [place] DEXA    Order Specific Question:   Reason for Exam (SYMPTOM  OR DIAGNOSIS REQUIRED)    Answer:   osteoporosis f/u    Order Specific Question:   Preferred imaging location?    Answer:   [facility]   Lipid panel   Comprehensive metabolic panel   Hemoglobin A1c   TSH   CBC with Differential/Platelet   Vitamin B12   Parathyroid hormone,  intact (no Ca)   VITAMIN D 25 Hydroxy (Vit-D Deficiency, Fractures)   RPR    Patient Instructions  Labs today  I will order repeat bone density scan to monitor osteoporosis.  Continue good calcium in diet, continue vitamin D daily.  Find out date of 2nd shingles shot.  Return in 6 months for follow up visit  Reviewed 4 core lifestyle modifications to support a healthy mind:  1. Nutritious well balance diet.  2. Regular physical activity routine.  3. Regular mental activity such as reading books, word puzzles, math puzzles, jigsaw puzzles.  4. Social engagement.  Also ensure good blood pressure control, limit alcohol, no smoking.    Follow up plan: Return in about 6 months (around 11/06/2023) for follow up visit.  Eustaquio Boyden, MD

## 2023-05-06 NOTE — Assessment & Plan Note (Addendum)
Preventative protocols reviewed and updated unless pt declined. Discussed healthy diet and lifestyle.  

## 2023-05-07 ENCOUNTER — Encounter: Payer: Self-pay | Admitting: Family Medicine

## 2023-05-07 ENCOUNTER — Other Ambulatory Visit: Payer: Self-pay | Admitting: Family Medicine

## 2023-05-07 DIAGNOSIS — E538 Deficiency of other specified B group vitamins: Secondary | ICD-10-CM | POA: Insufficient documentation

## 2023-05-07 LAB — COMPREHENSIVE METABOLIC PANEL
AG Ratio: 1.4 (calc) (ref 1.0–2.5)
ALT: 9 U/L (ref 9–46)
AST: 17 U/L (ref 10–35)
Albumin: 4.2 g/dL (ref 3.6–5.1)
Alkaline phosphatase (APISO): 74 U/L (ref 35–144)
BUN/Creatinine Ratio: 17 (calc) (ref 6–22)
BUN: 22 mg/dL (ref 7–25)
CO2: 26 mmol/L (ref 20–32)
Calcium: 9.2 mg/dL (ref 8.6–10.3)
Chloride: 107 mmol/L (ref 98–110)
Creat: 1.3 mg/dL — ABNORMAL HIGH (ref 0.70–1.22)
Globulin: 3 g/dL (calc) (ref 1.9–3.7)
Glucose, Bld: 83 mg/dL (ref 65–99)
Potassium: 4.7 mmol/L (ref 3.5–5.3)
Sodium: 143 mmol/L (ref 135–146)
Total Bilirubin: 0.6 mg/dL (ref 0.2–1.2)
Total Protein: 7.2 g/dL (ref 6.1–8.1)

## 2023-05-07 LAB — HEMOGLOBIN A1C
Hgb A1c MFr Bld: 5.7 % of total Hgb — ABNORMAL HIGH (ref <5.7)
Mean Plasma Glucose: 117 mg/dL
eAG (mmol/L): 6.5 mmol/L

## 2023-05-07 LAB — CBC WITH DIFFERENTIAL/PLATELET
Absolute Monocytes: 725 cells/uL (ref 200–950)
Basophils Absolute: 48 cells/uL (ref 0–200)
Basophils Relative: 0.7 %
Eosinophils Absolute: 207 cells/uL (ref 15–500)
Lymphs Abs: 1642 cells/uL (ref 850–3900)
MCH: 30.1 pg (ref 27.0–33.0)
MCHC: 33.7 g/dL (ref 32.0–36.0)
MCV: 89.3 fL (ref 80.0–100.0)
MPV: 11.3 fL (ref 7.5–12.5)
RBC: 5.12 10*6/uL (ref 4.20–5.80)
WBC: 6.9 10*3/uL (ref 3.8–10.8)

## 2023-05-07 LAB — LIPID PANEL
Cholesterol: 169 mg/dL (ref <200)
HDL: 44 mg/dL (ref >=40)
LDL Cholesterol (Calc): 105 mg/dL (calc) — ABNORMAL HIGH
Non-HDL Cholesterol (Calc): 125 mg/dL (calc) (ref <130)
Total CHOL/HDL Ratio: 3.8 (calc) (ref <5.0)
Triglycerides: 100 mg/dL (ref <150)

## 2023-05-07 LAB — TSH: TSH: 2.08 mIU/L (ref 0.40–4.50)

## 2023-05-07 LAB — VITAMIN B12: Vitamin B-12: 305 pg/mL (ref 200–1100)

## 2023-05-07 LAB — VITAMIN D 25 HYDROXY (VIT D DEFICIENCY, FRACTURES): Vit D, 25-Hydroxy: 49 ng/mL (ref 30–100)

## 2023-05-07 LAB — PARATHYROID HORMONE, INTACT (NO CA): PTH: 54 pg/mL (ref 16–77)

## 2023-05-07 LAB — RPR: RPR Ser Ql: NONREACTIVE

## 2023-05-07 MED ORDER — VITAMIN B-12 1000 MCG PO TABS: 1000.0000 ug | ORAL_TABLET | Freq: Every day | ORAL | Status: AC

## 2023-05-07 NOTE — Assessment & Plan Note (Signed)
Chronic. Update FLP off medication.  The ASCVD Risk score (Arnett DK, et al., 2019) failed to calculate for the following reasons:   The 2019 ASCVD risk score is only valid for ages 36 to 77

## 2023-05-07 NOTE — Assessment & Plan Note (Signed)
H/o this, on repeat normalized

## 2023-05-07 NOTE — Assessment & Plan Note (Signed)
Update A1c today 

## 2023-05-07 NOTE — Assessment & Plan Note (Signed)
Discussed with patient known history of osteoporosis - recommended good calcium intake through diet, continue vitamin D 1000 IU daily, and regular weight bearing exercises.  Previously did not tolerate oral bisphosphonate, and could not afford prolia. Consider IV yearly Reclast.  Will start with updating DEXA - he requests through [facility].

## 2023-05-07 NOTE — Assessment & Plan Note (Addendum)
H/o this. Update PTH.

## 2023-05-07 NOTE — Assessment & Plan Note (Signed)
Chronic, stable period on low dose Toprol XL - continue.

## 2023-05-07 NOTE — Assessment & Plan Note (Addendum)
Difficulty with MMSE and CDT noted today. He previously failed 6CIT with health advisor. Discussed concern for memory impairment however he continues functioning well. Possible mild cognitive impairment. Reviewed 4 core measures to foment a healthy mind - nutritions diet, physical activity, using mind ie reading and word puzzles, and social engagement. Declines medication at this time, will consider in future.

## 2023-05-07 NOTE — Assessment & Plan Note (Signed)
H/o this on imaging, not on statin.

## 2023-05-07 NOTE — Assessment & Plan Note (Signed)
Continue daily vit D replacement.  

## 2023-05-10 ENCOUNTER — Telehealth: Payer: Self-pay | Admitting: Family Medicine

## 2023-05-10 NOTE — Telephone Encounter (Signed)
Patient came by and dropped off vaccination records per request for Dr Red Christians made copies and left them in his folder up front.

## 2023-05-10 NOTE — Telephone Encounter (Signed)
Noted.  Updated pt's chart.  

## 2023-05-13 DIAGNOSIS — H11222 Conjunctival granuloma, left eye: Secondary | ICD-10-CM | POA: Diagnosis not present

## 2023-07-26 DIAGNOSIS — Z0100 Encounter for examination of eyes and vision without abnormal findings: Secondary | ICD-10-CM | POA: Diagnosis not present

## 2023-07-26 DIAGNOSIS — Z961 Presence of intraocular lens: Secondary | ICD-10-CM | POA: Diagnosis not present

## 2023-08-24 ENCOUNTER — Ambulatory Visit
Admission: RE | Admit: 2023-08-24 | Discharge: 2023-08-24 | Disposition: A | Discharge status: Home / Self Care | Payer: Medicare HMO | Source: Ambulatory Visit | Attending: Family Medicine | Admitting: Family Medicine

## 2023-08-24 DIAGNOSIS — M818 Other osteoporosis without current pathological fracture: Secondary | ICD-10-CM | POA: Insufficient documentation

## 2023-08-24 DIAGNOSIS — M810 Age-related osteoporosis without current pathological fracture: Secondary | ICD-10-CM | POA: Diagnosis not present

## 2023-09-08 ENCOUNTER — Ambulatory Visit: Payer: Medicare HMO

## 2023-11-07 ENCOUNTER — Telehealth: Payer: Self-pay | Admitting: Family Medicine

## 2023-11-07 ENCOUNTER — Encounter: Payer: Self-pay | Admitting: Family Medicine

## 2023-11-07 ENCOUNTER — Ambulatory Visit: Payer: Medicare HMO | Admitting: Family Medicine

## 2023-11-07 VITALS — BP 122/76 | HR 59 | Temp 97.8°F | Ht 64.5 in | Wt 150.0 lb

## 2023-11-07 DIAGNOSIS — R413 Other amnesia: Secondary | ICD-10-CM | POA: Diagnosis not present

## 2023-11-07 DIAGNOSIS — M818 Other osteoporosis without current pathological fracture: Secondary | ICD-10-CM

## 2023-11-07 DIAGNOSIS — E538 Deficiency of other specified B group vitamins: Secondary | ICD-10-CM | POA: Diagnosis not present

## 2023-11-07 MED ORDER — CALCIUM 600+D PLUS MINERALS 600-400 MG-UNIT PO CHEW: 1.0000 | CHEWABLE_TABLET | Freq: Every day | ORAL | Status: AC

## 2023-11-07 NOTE — Assessment & Plan Note (Signed)
Discussed recent DEXA as well as recommended dietary calcium intake. He does not like milk. For this reason I did recommend he start chewable calcium supplement.  He continues vit D replacement. Encouraged regular weight bearing exercises.  Reviewed osteoporosis medications specifically IV Reclast infusion versus Prolia injection every 6 months.  He has previously not tolerated oral bisphosphonate well.  I will ask to price out Prolia so patient can make an informed decision regarding treatment options.

## 2023-11-07 NOTE — Telephone Encounter (Signed)
Can we price out Prolia injection in office for patient?  Thanks

## 2023-11-07 NOTE — Progress Notes (Signed)
Ph: [phone] Fax: [phone]   Patient ID: [name], male    DOB: Sep 13, 1941, 82 y.o.   MRN: [id number]  This visit was conducted in person.  BP 122/76   Pulse (!) 59   Temp 97.8 F (36.6 C) (Oral)   Ht 5' 4.5" (1.638 m)   Wt 150 lb (68 kg)   SpO2 98%   BMI 25.35 kg/m    CC: 6 mo f/u visit  Subjective:   HPI: [name] is a 82 y.o. male presenting on 11/07/2023 for Medical Management of Chronic Issues (Here for 6 mo f/u.)   See prior note for details.  Memory evaluation - MMSE 23/30, CDT 2/4. 8th grade education. He notes he's always had trouble with learning, short term memory difficulty.    Vit B12 levels low normal (305) - started on b12 daily.   Osteoporosis - previously oral bisphosphonate not tolerated, prolia unaffordable.  DEXA 08/2023: T -3.2 TFR, -2.7 L forearm   Geriatric Assessment: Activities of Daily Living:     Bathing-independent    Dressing-independent    Eating-independent    Toileting-independent    Transferring-independent    Continence-independent Overall Assessment: independent  Instrumental Activities of Daily Living:     Transportation- independent    Meal/Food Preparation- independent    Shopping Errands- independent    Housekeeping/Chores- independent - but someone comes to help     Money Management/Finances- independent     Medication Management- independent     Ability to Use Telephone- independent     Laundry- independent  Overall Assessment: independent      Relevant past medical, surgical, family and social history reviewed and updated as indicated. Interim medical history since our last visit reviewed. Allergies and medications reviewed and updated. Outpatient Medications Prior to Visit  Medication Sig Dispense Refill   Cholecalciferol (VITAMIN D3) 25 MCG (1000 UT) CAPS Take 1 capsule (1,000 Units total) by mouth daily. 30 capsule    cyanocobalamin (VITAMIN B12) 1000 MCG tablet Take 1 tablet  (1,000 mcg total) by mouth daily.     metoprolol succinate (TOPROL-XL) 25 MG 24 hr tablet Take 0.5 tablets (12.5 mg total) by mouth daily. 45 tablet 4   No facility-administered medications prior to visit.     Per HPI unless specifically indicated in ROS section below Review of Systems  Objective:  BP 122/76   Pulse (!) 59   Temp 97.8 F (36.6 C) (Oral)   Ht 5' 4.5" (1.638 m)   Wt 150 lb (68 kg)   SpO2 98%   BMI 25.35 kg/m   Wt Readings from Last 3 Encounters:  11/07/23 150 lb (68 kg)  05/06/23 156 lb (70.8 kg)  03/30/23 154 lb (69.9 kg)      Physical Exam Vitals and nursing note reviewed.  Constitutional:      Appearance: Normal appearance. He is not ill-appearing.  HENT:     Mouth/Throat:     Mouth: Mucous membranes are moist.     Pharynx: Oropharynx is clear. No oropharyngeal exudate or posterior oropharyngeal erythema.  Eyes:     Extraocular Movements: Extraocular movements intact.     Pupils: Pupils are equal, round, and reactive to light.  Neck:     Thyroid: No thyroid mass or thyromegaly.  Cardiovascular:     Rate and Rhythm: Normal rate and regular rhythm.     Pulses: Normal pulses.     Heart sounds: Normal heart sounds. No murmur heard. Pulmonary:  Effort: Pulmonary effort is normal. No respiratory distress.     Breath sounds: Normal breath sounds. No wheezing, rhonchi or rales.  Musculoskeletal:     Right lower leg: No edema.     Left lower leg: No edema.  Skin:    General: Skin is warm and dry.  Neurological:     Mental Status: He is alert.  Psychiatric:        Mood and Affect: Mood normal.        Behavior: Behavior normal.       Results for orders placed or performed in visit on 05/06/23  Parathyroid hormone, intact (no Ca)  Result Value Ref Range   PTH 54 16 - 77 pg/mL  CBC with Differential/Platelet  Result Value Ref Range   WBC 6.9 3.8 - 10.8 Thousand/uL   RBC 5.12 4.20 - 5.80 Million/uL   Hemoglobin 15.4 13.2 - 17.1 g/dL   HCT 95.2  84.1 - 32.4 %   MCV 89.3 80.0 - 100.0 fL   MCH 30.1 27.0 - 33.0 pg   MCHC 33.7 32.0 - 36.0 g/dL   RDW 40.1 02.7 - 25.3 %   Platelets 163 140 - 400 Thousand/uL   MPV 11.3 7.5 - 12.5 fL   Neutro Abs 4,278 1,500 - 7,800 cells/uL   Lymphs Abs 1,642 850 - 3,900 cells/uL   Absolute Monocytes 725 200 - 950 cells/uL   Eosinophils Absolute 207 15 - 500 cells/uL   Basophils Absolute 48 0 - 200 cells/uL   Neutrophils Relative % 62 %   Total Lymphocyte 23.8 %   Monocytes Relative 10.5 %   Eosinophils Relative 3.0 %   Basophils Relative 0.7 %  Comprehensive metabolic panel  Result Value Ref Range   Glucose, Bld 83 65 - 99 mg/dL   BUN 22 7 - 25 mg/dL   Creat 6.64 (H) 4.03 - 1.22 mg/dL   BUN/Creatinine Ratio 17 6 - 22 (calc)   Sodium 143 135 - 146 mmol/L   Potassium 4.7 3.5 - 5.3 mmol/L   Chloride 107 98 - 110 mmol/L   CO2 26 20 - 32 mmol/L   Calcium 9.2 8.6 - 10.3 mg/dL   Total Protein 7.2 6.1 - 8.1 g/dL   Albumin 4.2 3.6 - 5.1 g/dL   Globulin 3.0 1.9 - 3.7 g/dL (calc)   AG Ratio 1.4 1.0 - 2.5 (calc)   Total Bilirubin 0.6 0.2 - 1.2 mg/dL   Alkaline phosphatase (APISO) 74 35 - 144 U/L   AST 17 10 - 35 U/L   ALT 9 9 - 46 U/L  Hemoglobin A1c  Result Value Ref Range   Hgb A1c MFr Bld 5.7 (H) <5.7 % of total Hgb   Mean Plasma Glucose 117 mg/dL   eAG (mmol/L) 6.5 mmol/L  Lipid panel  Result Value Ref Range   Cholesterol 169 <200 mg/dL   HDL 44 > OR = 40 mg/dL   Triglycerides 474 <259 mg/dL   LDL Cholesterol (Calc) 105 (H) mg/dL (calc)   Total CHOL/HDL Ratio 3.8 <5.0 (calc)   Non-HDL Cholesterol (Calc) 125 <130 mg/dL (calc)  RPR  Result Value Ref Range   RPR Ser Ql NON-REACTIVE NON-REACTIVE  TSH  Result Value Ref Range   TSH 2.08 0.40 - 4.50 mIU/L  Vitamin B12  Result Value Ref Range   Vitamin B-12 305 200 - 1,100 pg/mL  VITAMIN D 25 Hydroxy (Vit-D Deficiency, Fractures)  Result Value Ref Range   Vit D, 25-Hydroxy 49 30 - 100  ng/mL    Assessment & Plan:   Problem List  Items Addressed This Visit     Osteoporosis - Primary    Discussed recent DEXA as well as recommended dietary calcium intake. He does not like milk. For this reason I did recommend he start chewable calcium supplement.  He continues vit D replacement. Encouraged regular weight bearing exercises.  Reviewed osteoporosis medications specifically IV Reclast infusion versus Prolia injection every 6 months.  He has previously not tolerated oral bisphosphonate well.  I will ask to price out Prolia so patient can make an informed decision regarding treatment options.      Relevant Medications   Calcium Carbonate-Vit D-Min (CALCIUM 600+D PLUS MINERALS) 600-400 MG-UNIT CHEW   Memory impairment    Independent in ADL/IADLs  Overall stable period since starting vitamin B12 oral replacement-continue this.      Low serum vitamin B12    Continue vit B12 daily.         Meds ordered this encounter  Medications   Calcium Carbonate-Vit D-Min (CALCIUM 600+D PLUS MINERALS) 600-400 MG-UNIT CHEW    Sig: Chew 1 tablet by mouth daily.    No orders of the defined types were placed in this encounter.   Patient Instructions  You do have osteoporosis. Increase calcium fortified orange juice, dairy products, leafy green vegetables. Consider starting chewable calcium supplement 1 daily.  Continue vitamin D and regular weight bearing exercise.   Try to get most or all of your calcium from your food--aim for 1000 mg/day for women up to 50 and men up to 70 and 1200 mg/day for women over 50 and men over 70. To figure out dietary calcium: 300 mg/day from all non dairy foods plus 300 mg per cup of milk, other dairy, or fortified juice. Non dairy foods that contain calcium: Kale, oranges, sardines, oatmeal, soy milk/soybeans, salmon, white beans, dried figs, turnip greens, almonds, broccoli, tofu.   We will price out bone strengthening medicine denosumab (Prolia) to see if affordable.    Follow up  plan: Return in about 6 months (around 05/06/2024), or if symptoms worsen or fail to improve, for annual exam, prior fasting for blood work, medicare wellness visit.  Eustaquio Boyden, MD

## 2023-11-07 NOTE — Patient Instructions (Addendum)
You do have osteoporosis. Increase calcium fortified orange juice, dairy products, leafy green vegetables. Consider starting chewable calcium supplement 1 daily.  Continue vitamin D and regular weight bearing exercise.   Try to get most or all of your calcium from your food--aim for 1000 mg/day for women up to 50 and men up to 70 and 1200 mg/day for women over 50 and men over 70. To figure out dietary calcium: 300 mg/day from all non dairy foods plus 300 mg per cup of milk, other dairy, or fortified juice. Non dairy foods that contain calcium: Kale, oranges, sardines, oatmeal, soy milk/soybeans, salmon, white beans, dried figs, turnip greens, almonds, broccoli, tofu.   We will price out bone strengthening medicine denosumab (Prolia) to see if affordable.

## 2023-11-07 NOTE — Assessment & Plan Note (Signed)
Continue vit B12 daily.

## 2023-11-07 NOTE — Assessment & Plan Note (Addendum)
Independent in ADL/IADLs  Overall stable period since starting vitamin B12 oral replacement-continue this.

## 2023-11-21 ENCOUNTER — Telehealth: Payer: Self-pay

## 2023-11-21 NOTE — Telephone Encounter (Signed)
Created new encounter for Prolia BIV. Will route encounter back once benefit verification is complete.  

## 2023-11-21 NOTE — Telephone Encounter (Signed)
Prolia VOB initiated via [email or web address] 

## 2023-11-22 ENCOUNTER — Other Ambulatory Visit (HOSPITAL_COMMUNITY): Payer: Self-pay

## 2023-11-22 NOTE — Telephone Encounter (Signed)
Pt ready for scheduling for PROLIA on or after : 11/22/23  Out-of-pocket cost due at time of visit: $346  Primary: [employer] Prolia co-insurance: 20% Admin fee co-insurance: 20%  Secondary: --- Prolia co-insurance:  Admin fee co-insurance:   Medical Benefit Details: Date Benefits were checked: 11/22/23 Deductible: NO/ Coinsurance: 20%/ Admin Fee: 20%  Prior Auth: APPROVED PA# [id number] Expiration Date: 11/22/23-12/12/24  # of doses approved: 2  Pharmacy benefit: Copay $95 If patient wants fill through the pharmacy benefit please send prescription to: [employer], and include estimated need by date in rx notes. Pharmacy will ship medication directly to the office.  Patient NOT eligible for Prolia Copay Card. Copay Card can make patient's cost as little as $25. Link to apply: [email or web address]://[email or web address]  ** This summary of benefits is an estimation of the patient's out-of-pocket cost. Exact cost may very based on individual plan coverage.

## 2023-11-22 NOTE — Telephone Encounter (Signed)
Pharmacy Patient Advocate Encounter  Received notification from [facility] that Prior Authorization for PROLIA has been APPROVED from 11/22/23 to 12/12/24   PA #/Case ID/Reference #: [id number]

## 2023-11-22 NOTE — Telephone Encounter (Signed)
Pharmacy Patient Advocate Encounter   Received notification from  [facility] Portal that prior authorization for PROLIA is required/requested.   Insurance verification completed.   The patient is insured through [employer] .   Per test claim: PA required; PA submitted to above mentioned insurance via CoverMyMeds Key/confirmation #/EOC [id number] Status is pending

## 2023-11-25 ENCOUNTER — Other Ambulatory Visit: Payer: Self-pay

## 2023-11-25 DIAGNOSIS — M818 Other osteoporosis without current pathological fracture: Secondary | ICD-10-CM

## 2023-11-25 MED ORDER — DENOSUMAB 60 MG/ML ~~LOC~~ SOSY
60.0000 mg | PREFILLED_SYRINGE | Freq: Once | SUBCUTANEOUS | 0 refills | Status: AC
Start: 2023-11-25 — End: 2023-11-25

## 2023-11-25 NOTE — Telephone Encounter (Signed)
Called patient reviewed all following information including appointment, Co pay due at time of visit and if pick up of injection is needed from outside pharmacy.     Out of pocket for patient: $95  Lab appointment :  12/16  Nurse visit:  12/19  Lab order placed: Yes  Prolia has been  []   Ordered  []   Script sent to local pharmacy for patient to bring   [x]   Script sent to Specialty pharmacy   []   Script sent to [facility] to deliver

## 2023-11-28 ENCOUNTER — Other Ambulatory Visit (INDEPENDENT_AMBULATORY_CARE_PROVIDER_SITE_OTHER): Payer: Medicare HMO

## 2023-11-28 DIAGNOSIS — M818 Other osteoporosis without current pathological fracture: Secondary | ICD-10-CM

## 2023-11-29 ENCOUNTER — Telehealth: Payer: Self-pay | Admitting: Family Medicine

## 2023-11-29 LAB — BASIC METABOLIC PANEL
BUN: 25 mg/dL — ABNORMAL HIGH (ref 6–23)
CO2: 30 meq/L (ref 19–32)
Calcium: 9 mg/dL (ref 8.4–10.5)
Chloride: 108 meq/L (ref 96–112)
Creatinine, Ser: 1.39 mg/dL (ref 0.40–1.50)
GFR: 46.6 mL/min — ABNORMAL LOW (ref >60.00)
Glucose, Bld: 87 mg/dL (ref 70–99)
Potassium: 4.5 meq/L (ref 3.5–5.1)
Sodium: 143 meq/L (ref 135–145)

## 2023-11-29 NOTE — Telephone Encounter (Signed)
[facility] called to scheduled pt's prolia. Scheduled delivery date is Thurs, 12/01/23. Call back # [phone]

## 2023-11-30 ENCOUNTER — Encounter: Payer: Medicare HMO | Admitting: Dermatology

## 2023-12-01 ENCOUNTER — Ambulatory Visit: Payer: Medicare HMO

## 2023-12-01 NOTE — Telephone Encounter (Signed)
Received pt's Prolia shipment. Placed in 2nd refrigerator.

## 2023-12-08 ENCOUNTER — Ambulatory Visit: Payer: Medicare HMO | Admitting: *Deleted

## 2023-12-08 DIAGNOSIS — M818 Other osteoporosis without current pathological fracture: Secondary | ICD-10-CM | POA: Diagnosis not present

## 2023-12-08 MED ORDER — DENOSUMAB 60 MG/ML ~~LOC~~ SOSY
60.0000 mg | PREFILLED_SYRINGE | Freq: Once | SUBCUTANEOUS | Status: AC
Start: 2023-12-22 — End: 2023-12-08
  Administered 2023-12-08: 60 mg via SUBCUTANEOUS

## 2023-12-08 MED ORDER — DENOSUMAB 60 MG/ML ~~LOC~~ SOSY
60.0000 mg | PREFILLED_SYRINGE | Freq: Once | SUBCUTANEOUS | Status: AC
Start: 2024-06-07 — End: ?

## 2023-12-08 NOTE — Progress Notes (Signed)
Per orders of Dr. Hannah Beat (PCP out of the office today), injection of Prolia  given by Blenda Mounts M in left arm Patient tolerated injection well. Patient will make appointment for 6 month.

## 2023-12-26 DIAGNOSIS — Z961 Presence of intraocular lens: Secondary | ICD-10-CM | POA: Diagnosis not present

## 2024-01-24 DIAGNOSIS — L570 Actinic keratosis: Secondary | ICD-10-CM | POA: Diagnosis not present

## 2024-01-24 DIAGNOSIS — B351 Tinea unguium: Secondary | ICD-10-CM | POA: Diagnosis not present

## 2024-01-25 NOTE — Progress Notes (Signed)
This encounter was created in error - please disregard.

## 2024-02-23 ENCOUNTER — Other Ambulatory Visit (INDEPENDENT_AMBULATORY_CARE_PROVIDER_SITE_OTHER)

## 2024-02-23 ENCOUNTER — Other Ambulatory Visit: Payer: Self-pay

## 2024-02-23 DIAGNOSIS — I10 Essential (primary) hypertension: Secondary | ICD-10-CM

## 2024-02-24 LAB — MICROALBUMIN / CREATININE URINE RATIO
Creatinine,U: 193.9 mg/dL
Microalb Creat Ratio: 22.6 mg/g (ref 0.0–30.0)
Microalb, Ur: 4.4 mg/dL — ABNORMAL HIGH (ref 0.0–1.9)

## 2024-04-02 ENCOUNTER — Ambulatory Visit (INDEPENDENT_AMBULATORY_CARE_PROVIDER_SITE_OTHER): Payer: Medicare HMO

## 2024-04-02 VITALS — BP 122/76 | Ht 64.5 in | Wt 145.0 lb

## 2024-04-02 DIAGNOSIS — Z0000 Encounter for general adult medical examination without abnormal findings: Secondary | ICD-10-CM | POA: Diagnosis not present

## 2024-04-02 DIAGNOSIS — Z2821 Immunization not carried out because of patient refusal: Secondary | ICD-10-CM

## 2024-04-02 NOTE — Progress Notes (Signed)
 Because this visit was a virtual/telehealth visit,  certain criteria was not obtained, such a blood pressure, CBG if applicable, and timed get up and go. Any medications not marked as "taking" were not mentioned during the medication reconciliation part of the visit. Any vitals not documented were not able to be obtained due to this being a telehealth visit or patient was unable to self-report a recent blood pressure reading due to a lack of equipment at home via telehealth. Vitals that have been documented are verbally provided by the patient.  Subjective:   [name] is a 83 y.o. who presents for a Medicare Wellness preventive visit.  Visit Complete: Virtual I connected with  Quade Ramirez on 04/02/24 by a audio enabled telemedicine application and verified that I am speaking with the correct person using two identifiers.  Patient Location: Home  Provider Location: Home Office  I discussed the limitations of evaluation and management by telemedicine. The patient expressed understanding and agreed to proceed.  Vital Signs: Because this visit was a virtual/telehealth visit, some criteria may be missing or patient reported. Any vitals not documented were not able to be obtained and vitals that have been documented are patient reported.  VideoDeclined- This patient declined Librarian, academic. Therefore the visit was completed with audio only.  Persons Participating in Visit: Patient.  AWV Questionnaire: No: Patient Medicare AWV questionnaire was not completed prior to this visit.  Cardiac Risk Factors include: advanced age (>77men, >40 women);sedentary lifestyle;male gender     Objective:    Today's Vitals   04/02/24 1554  BP: 122/76  Weight: 145 lb (65.8 kg)  Height: 5' 4.5" (1.638 m)   Body mass index is 24.5 kg/m.     04/02/2024    4:02 PM 03/30/2023    3:15 PM 12/16/2021   12:56 PM 05/29/2021    6:39 AM 05/21/2021    3:52 PM 04/13/2021     2:48 PM 12/03/2020   12:14 PM  Advanced Directives  Does Patient Have a Medical Advance Directive? No Yes Yes Yes No No Yes  Type of Special educational needs teacher of [place];Living will Healthcare Power of [name];Living will Healthcare Power of Attorney     Does patient want to make changes to medical advance directive?  No - Patient declined Yes (MAU/Ambulatory/Procedural Areas - Information given)      Copy of Healthcare Power of Attorney in Chart?  Yes - validated most recent copy scanned in chart (See row information) Yes - validated most recent copy scanned in chart (See row information) No - copy requested     Would patient like information on creating a medical advance directive? No - Patient declined    No - Patient declined No - Patient declined     Current Medications (verified) Outpatient Encounter Medications as of 04/02/2024  Medication Sig   Calcium  Carbonate-Vit D-Min (CALCIUM  600+D PLUS MINERALS) 600-400 MG-UNIT CHEW Chew 1 tablet by mouth daily.   Cholecalciferol (VITAMIN D3) 25 MCG (1000 UT) CAPS Take 1 capsule (1,000 Units total) by mouth daily.   cyanocobalamin  (VITAMIN B12) 1000 MCG tablet Take 1 tablet (1,000 mcg total) by mouth daily.   metoprolol  succinate (TOPROL -XL) 25 MG 24 hr tablet Take 0.5 tablets (12.5 mg total) by mouth daily.   Facility-Administered Encounter Medications as of 04/02/2024  Medication   [START ON 06/07/2024] denosumab  (PROLIA ) injection 60 mg    Allergies (verified) Patient has no known allergies.   History: Past Medical History:  Diagnosis Date   Actinic keratosis    BPH with urinary obstruction    sees urology Dr. Ace Holder   Cardiac murmur    grade II/VI mid-systolic; "blowing" and lower LSB   Carotid stenosis 07/20/2016   Minimal on US  07/2016. Monitor clinically.   Complication of anesthesia    (+) postoperative delirium; "hallucinations"   Depression with anxiety    Elevated PSA 2014   sees urology - normal biopsies x 3    HLD (hyperlipidemia)    Left kidney mass 09/2014   on CT scan thought consistent with simple cyst on prior US    Nasal obstruction    congenital R sided   Obstructive pyelonephritis 09/2014   s/p hospitalization and treatment with levaquin    OSA (obstructive sleep apnea)    pt denies   Osteoporosis 06/04/2018   DEXA T score -37 at L spine   Paroxysmal atrial fibrillation (HCC)    Recurrent kidney stones 2014   extensive bilateral nephrolithiasis, sees urology Dulcy Gibney), hydrocodone  prn   SOB (shortness of breath)    Squamous cell carcinoma of skin 07/25/2019   crown scalp   Squamous cell carcinoma of skin 06/29/2017   SCC IS ant to crown post   Symptomatic PVCs    per prior cardiologist, on beta blocker   Urge incontinence    thought overactive bladder   Past Surgical History:  Procedure Laterality Date   CARDIAC CATHETERIZATION  03/2012   EF 45-50%, no LVH, + impaired LV relaxation, no PH, no valve abnormalities   CARDIOVASCULAR STRESS TEST  03/2012   mild peri infarct ischemia in apical segment, decreased EF   CATARACT EXTRACTION Right 01/2013   COLONOSCOPY  02/2008   mod diverticulosis o/w WNL (2009)   CYSTOSCOPY  10/2014   s/p stone removal, with stent removal Ami Kail)   CYSTOSCOPY WITH STENT PLACEMENT Right 11/18/2020   Procedure: CYSTOSCOPY WITH STENT PLACEMENT;  Surgeon: Geraline Knapp, MD;  Location: [facility];  Service: Urology;  Laterality: Right;   CYSTOSCOPY/URETEROSCOPY/HOLMIUM LASER/STENT PLACEMENT Left 08/11/2020   Procedure: CYSTOSCOPY/URETEROSCOPY/STENT PLACEMENT;  Surgeon: Dustin Gimenez, MD;  Location: [facility];  Service: Urology;  Laterality: Left;   CYSTOSCOPY/URETEROSCOPY/HOLMIUM LASER/STENT PLACEMENT N/A 08/28/2020   Procedure: CYSTOSCOPY/URETEROSCOPY/HOLMIUM LASER/STENT exchange;  Surgeon: Dustin Gimenez, MD;  Location: [facility];  Service: Urology;  Laterality: N/A;   CYSTOSCOPY/URETEROSCOPY/HOLMIUM LASER/STENT PLACEMENT Right 12/09/2020    Procedure: CYSTOSCOPY/URETEROSCOPY/HOLMIUM LASER/STENT Exchange;  Surgeon: Geraline Knapp, MD;  Location: [facility];  Service: Urology;  Laterality: Right;   HOLEP-LASER ENUCLEATION OF THE PROSTATE WITH MORCELLATION N/A 08/11/2020   Procedure: HOLEP-LASER ENUCLEATION OF THE PROSTATE WITH MORCELLATION;  Surgeon: Dustin Gimenez, MD;  Location: [facility];  Service: Urology;  Laterality: N/A;   TONSILECTOMY, ADENOIDECTOMY, BILATERAL MYRINGOTOMY AND TUBES     removed as a child   US  ECHOCARDIOGRAPHY  03/2012   hypokinetic anterior wall, EF 45-50%, impaired relaxation pattern, mild LA dilation, mild-mod MR, mild-mod AR   Family History  Problem Relation Age of Onset   Kidney Stones Mother    CAD Father    Kidney Stones Daughter    Diabetes Neg Hx    Cancer Neg Hx    Stroke Neg Hx    Hypertension Neg Hx    Prostate cancer Neg Hx    Kidney cancer Neg Hx    Bladder Cancer Neg Hx    Social History   Socioeconomic History   Marital status: Married    Spouse name: Not on file   Number of  children: Not on file   Years of education: Not on file   Highest education level: Not on file  Occupational History   Not on file  Tobacco Use   Smoking status: Never   Smokeless tobacco: Never  Vaping Use   Vaping status: Never Used  Substance and Sexual Activity   Alcohol use: No   Drug use: No   Sexual activity: Not Currently  Other Topics Concern   Not on file  Social History Narrative   Lives with wife Ivette Marks)    1 grown daughter   Occupation: retired, worked on Retail buyer for 30 yrs   Edu: 8th grade, completed GED   Activity: walking some   Diet: good water, fruits/vegetables daily.      He notes he's always had learning difficulties and short term memory difficulty, was more mechanically inclined.       Advanced directives: daughter is HCPOA for now   Social Drivers of Corporate investment banker Strain: Low Risk  (04/02/2024)   Overall Financial Resource Strain (CARDIA)     Difficulty of Paying Living Expenses: Not hard at all  Food Insecurity: No Food Insecurity (04/02/2024)   Hunger Vital Sign    Worried About Running Out of Food in the Last Year: Never true    Ran Out of Food in the Last Year: Never true  Transportation Needs: No Transportation Needs (04/02/2024)   PRAPARE - Administrator, Civil Service (Medical): No    Lack of Transportation (Non-Medical): No  Physical Activity: Inactive (04/02/2024)   Exercise Vital Sign    Days of Exercise per Week: 0 days    Minutes of Exercise per Session: 0 min  Stress: No Stress Concern Present (04/02/2024)   [employer] of Occupational Health - Occupational Stress Questionnaire    Feeling of Stress : Not at all  Social Connections: Moderately Isolated (04/02/2024)   Social Connection and Isolation Panel [NHANES]    Frequency of Communication with Friends and Family: More than three times a week    Frequency of Social Gatherings with Friends and Family: More than three times a week    Attends Religious Services: Never    Database administrator or Organizations: No    Attends Engineer, structural: Never    Marital Status: Married    Tobacco Counseling Counseling given: Not Answered    Clinical Intake:  Pre-visit preparation completed: Yes        BMI - recorded: 24.5 Nutritional Status: BMI of 19-24  Normal Nutritional Risks: None Diabetes: No  Lab Results  Component Value Date   HGBA1C 5.7 (H) 05/06/2023   HGBA1C 5.7 04/06/2022   HGBA1C 5.6 08/14/2019     How often do you need to have someone help you when you read instructions, pamphlets, or other written materials from your doctor or pharmacy?: 1 - Never What is the last grade level you completed in school?: 9th grade, and GED     Information entered by :: [employer]   Activities of Daily Living     04/02/2024    4:00 PM  In your present state of health, do you have any difficulty performing the  following activities:  Hearing? 0  Vision? 0  Difficulty concentrating or making decisions? 0  Walking or climbing stairs? 0  Dressing or bathing? 0  Doing errands, shopping? 0  Preparing Food and eating ? N  Comment patient wife cooks  Using the Toilet? N  In the past six months, have you accidently leaked urine? N  Do you have problems with loss of bowel control? N  Managing your Medications? N  Managing your Finances? N  Housekeeping or managing your Housekeeping? N    Patient Care Team: Claire Crick, MD as PCP - General (Family Medicine) Arminda Berth, OD as Referring Physician (Optometry)  Indicate any recent Medical Services you may have received from other than [facility] providers in the past year (date may be approximate).     Assessment:   This is a routine wellness examination for [name].  Hearing/Vision screen Hearing Screening - Comments:: No hearing aids Vision Screening - Comments:: Patient wears glasses   Goals Addressed             This Visit's Progress    Patient Stated   On track    08/14/2019, Wants to eat healthier       Depression Screen     04/02/2024    4:03 PM 11/07/2023    2:23 PM 03/30/2023    3:14 PM 04/13/2022    3:36 PM 12/16/2021    1:00 PM 08/14/2019   12:07 PM 08/09/2018   12:28 PM  PHQ 2/9 Scores  PHQ - 2 Score 2 0 0 0 0 0 0  PHQ- 9 Score 2   0  0 0    Fall Risk     04/02/2024    4:02 PM 11/07/2023    2:22 PM 03/30/2023    3:09 PM 12/16/2021   12:59 PM 08/14/2019   12:07 PM  Fall Risk   Falls in the past year? 0 0 0 0 0  Number falls in past yr: 0  0 0   Injury with Fall? 0  0 0   Risk for fall due to : No Fall Risks  No Fall Risks No Fall Risks   Follow up Falls prevention discussed;Falls evaluation completed  Falls prevention discussed;Falls evaluation completed Falls prevention discussed Falls evaluation completed;Falls prevention discussed    MEDICARE RISK AT HOME:  Medicare Risk at Home Any stairs in or around the  home?: Yes If so, are there any without handrails?: No Home free of loose throw rugs in walkways, pet beds, electrical cords, etc?: Yes Adequate lighting in your home to reduce risk of falls?: Yes Life alert?: No Use of a cane, walker or w/c?: No Grab bars in the bathroom?: Yes Shower chair or bench in shower?: No Elevated toilet seat or a handicapped toilet?: No  TIMED UP AND GO:  Was the test performed?  No  Cognitive Function: 6CIT completed    08/14/2019   12:12 PM 08/09/2018   12:27 PM 05/21/2016    1:26 PM  MMSE - Mini Mental State Exam  Orientation to time 5 5 5   Orientation to Place 5 5 5   Registration 3 3 3   Attention/ Calculation 3 0 0  Recall 3 3 3   Language- name 2 objects 0 0 0  Language- repeat 1 1 1   Language- follow 3 step command 0 3 3  Language- read & follow direction 0 0 0  Write a sentence 0 0 0  Copy design 0 0 0  Total score 20 20 20         04/02/2024    3:56 PM 03/30/2023    3:18 PM  6CIT Screen  What Year? 0 points 0 points  What month? 0 points 0 points  What time? 0 points 0 points  Count back from  20 0 points 2 points  Months in reverse 0 points 4 points  Repeat phrase 0 points 10 points  Total Score 0 points 16 points    Immunizations Immunization History  Administered Date(s) Administered   Fluad Quad(high Dose 65+) 08/21/2019, 09/14/2021, 08/31/2022, 09/01/2023   Influenza Whole 10/13/2013   Influenza, High Dose Seasonal PF 10/07/2020   Influenza,inj,Quad PF,6+ Mos 08/26/2015, 08/25/2016, 09/01/2017, 08/11/2018   PFIZER Comirnaty(Gray Top)Covid-19 Tri-Sucrose Vaccine 09/01/2023   PFIZER(Purple Top)SARS-COV-2 Vaccination 01/18/2020, 02/12/2020   Pfizer Covid-19 Vaccine Bivalent Booster 21yrs & up 12/15/2021   Pfizer(Comirnaty)Fall Seasonal Vaccine 12 years and older 12/17/2022   Pneumococcal Conjugate-13 02/19/2014   Pneumococcal Polysaccharide-23 01/18/2011, 11/22/2020   Tdap 01/18/2011   Zoster Recombinant(Shingrix) 10/03/2019,  04/29/2020   Zoster, Live 06/23/2015    Screening Tests Health Maintenance  Topic Date Due   DTaP/Tdap/Td (2 - Td or Tdap) 01/18/2021   COVID-19 Vaccine (6 - 2024-25 season) 10/27/2023   INFLUENZA VACCINE  07/13/2024   Medicare Annual Wellness (AWV)  04/02/2025   Pneumonia Vaccine 76+ Years old  Completed   Zoster Vaccines- Shingrix  Completed   HPV VACCINES  Aged Out   Meningococcal B Vaccine  Aged Out    Health Maintenance  Health Maintenance Due  Topic Date Due   DTaP/Tdap/Td (2 - Td or Tdap) 01/18/2021   COVID-19 Vaccine (6 - 2024-25 season) 10/27/2023   Health Maintenance Items Addressed:  Additional Screening:  Vision Screening: Recommended annual ophthalmology exams for early detection of glaucoma and other disorders of the eye.  Dental Screening: Recommended annual dental exams for proper oral hygiene  Community Resource Referral / Chronic Care Management: CRR required this visit?  No   CCM required this visit?  No     Plan:     I have personally reviewed and noted the following in the patient's chart:   Medical and social history Use of alcohol, tobacco or illicit drugs  Current medications and supplements including opioid prescriptions. Patient is not currently taking opioid prescriptions. Functional ability and status Nutritional status Physical activity Advanced directives List of other physicians Hospitalizations, surgeries, and ER visits in previous 12 months Vitals Screenings to include cognitive, depression, and falls Referrals and appointments  In addition, I have reviewed and discussed with patient certain preventive protocols, quality metrics, and best practice recommendations. A written personalized care plan for preventive services as well as general preventive health recommendations were provided to patient.     Freeda Jerry, [place]   04/02/2024   After Visit Summary: (MyChart) Due to this being a telephonic visit, the after visit  summary with patients personalized plan was offered to patient via MyChart   Notes: Nothing significant to report at this time.

## 2024-04-25 ENCOUNTER — Other Ambulatory Visit: Payer: Self-pay | Admitting: Family Medicine

## 2024-04-25 DIAGNOSIS — E782 Mixed hyperlipidemia: Secondary | ICD-10-CM

## 2024-04-25 DIAGNOSIS — R7989 Other specified abnormal findings of blood chemistry: Secondary | ICD-10-CM

## 2024-04-25 DIAGNOSIS — R7303 Prediabetes: Secondary | ICD-10-CM

## 2024-04-25 DIAGNOSIS — E559 Vitamin D deficiency, unspecified: Secondary | ICD-10-CM

## 2024-04-25 DIAGNOSIS — E041 Nontoxic single thyroid nodule: Secondary | ICD-10-CM

## 2024-04-25 DIAGNOSIS — M818 Other osteoporosis without current pathological fracture: Secondary | ICD-10-CM

## 2024-04-25 DIAGNOSIS — N289 Disorder of kidney and ureter, unspecified: Secondary | ICD-10-CM

## 2024-04-25 DIAGNOSIS — E538 Deficiency of other specified B group vitamins: Secondary | ICD-10-CM

## 2024-05-01 ENCOUNTER — Other Ambulatory Visit: Payer: Medicare HMO

## 2024-05-01 DIAGNOSIS — E559 Vitamin D deficiency, unspecified: Secondary | ICD-10-CM

## 2024-05-01 DIAGNOSIS — N289 Disorder of kidney and ureter, unspecified: Secondary | ICD-10-CM | POA: Diagnosis not present

## 2024-05-01 DIAGNOSIS — E538 Deficiency of other specified B group vitamins: Secondary | ICD-10-CM

## 2024-05-01 DIAGNOSIS — E782 Mixed hyperlipidemia: Secondary | ICD-10-CM

## 2024-05-01 DIAGNOSIS — M818 Other osteoporosis without current pathological fracture: Secondary | ICD-10-CM

## 2024-05-01 DIAGNOSIS — E041 Nontoxic single thyroid nodule: Secondary | ICD-10-CM

## 2024-05-01 DIAGNOSIS — R7989 Other specified abnormal findings of blood chemistry: Secondary | ICD-10-CM

## 2024-05-01 DIAGNOSIS — R7303 Prediabetes: Secondary | ICD-10-CM

## 2024-05-01 LAB — COMPREHENSIVE METABOLIC PANEL WITH GFR
ALT: 7 U/L (ref 0–53)
AST: 16 U/L (ref 0–37)
Albumin: 3.9 g/dL (ref 3.5–5.2)
Alkaline Phosphatase: 62 U/L (ref 39–117)
BUN: 21 mg/dL (ref 6–23)
CO2: 27 meq/L (ref 19–32)
Calcium: 8.9 mg/dL (ref 8.4–10.5)
Chloride: 108 meq/L (ref 96–112)
Creatinine, Ser: 1.32 mg/dL (ref 0.40–1.50)
GFR: 49.44 mL/min — ABNORMAL LOW (ref >60.00)
Glucose, Bld: 88 mg/dL (ref 70–99)
Potassium: 4.2 meq/L (ref 3.5–5.1)
Sodium: 142 meq/L (ref 135–145)
Total Bilirubin: 0.8 mg/dL (ref 0.2–1.2)
Total Protein: 6.5 g/dL (ref 6.0–8.3)

## 2024-05-01 LAB — LIPID PANEL
Cholesterol: 154 mg/dL (ref 0–200)
HDL: 40.1 mg/dL (ref >39.00)
LDL Cholesterol: 88 mg/dL (ref 0–99)
NonHDL: 113.67
Total CHOL/HDL Ratio: 4
Triglycerides: 126 mg/dL (ref 0.0–149.0)
VLDL: 25.2 mg/dL (ref 0.0–40.0)

## 2024-05-01 LAB — CBC WITH DIFFERENTIAL/PLATELET
Basophils Absolute: 0.1 10*3/uL (ref 0.0–0.1)
Basophils Relative: 0.9 % (ref 0.0–3.0)
Eosinophils Absolute: 0.2 10*3/uL (ref 0.0–0.7)
Eosinophils Relative: 3.2 % (ref 0.0–5.0)
HCT: 44.2 % (ref 39.0–52.0)
Hemoglobin: 14.4 g/dL (ref 13.0–17.0)
Lymphocytes Relative: 21.3 % (ref 12.0–46.0)
Lymphs Abs: 1.4 10*3/uL (ref 0.7–4.0)
MCHC: 32.5 g/dL (ref 30.0–36.0)
MCV: 90.3 fl (ref 78.0–100.0)
Monocytes Absolute: 0.7 10*3/uL (ref 0.1–1.0)
Monocytes Relative: 11 % (ref 3.0–12.0)
Neutro Abs: 4.2 10*3/uL (ref 1.4–7.7)
Neutrophils Relative %: 63.6 % (ref 43.0–77.0)
Platelets: 142 10*3/uL — ABNORMAL LOW (ref 150.0–400.0)
RBC: 4.89 Mil/uL (ref 4.22–5.81)
RDW: 13.9 % (ref 11.5–15.5)
WBC: 6.6 10*3/uL (ref 4.0–10.5)

## 2024-05-01 LAB — HEMOGLOBIN A1C: Hgb A1c MFr Bld: 5.6 % (ref 4.6–6.5)

## 2024-05-01 LAB — VITAMIN D 25 HYDROXY (VIT D DEFICIENCY, FRACTURES): VITD: 69.74 ng/mL (ref 30.00–100.00)

## 2024-05-01 LAB — TSH: TSH: 2.03 u[IU]/mL (ref 0.35–5.50)

## 2024-05-01 LAB — PHOSPHORUS: Phosphorus: 4 mg/dL (ref 2.3–4.6)

## 2024-05-01 LAB — VITAMIN B12: Vitamin B-12: 619 pg/mL (ref 211–911)

## 2024-05-02 ENCOUNTER — Ambulatory Visit: Payer: Self-pay | Admitting: Family Medicine

## 2024-05-02 LAB — PARATHYROID HORMONE, INTACT (NO CA): PTH: 30 pg/mL (ref 16–77)

## 2024-05-02 NOTE — Addendum Note (Signed)
 Addended by: Gerry Krone on: 05/02/2024 03:14 PM   Modules accepted: Orders

## 2024-05-08 ENCOUNTER — Telehealth: Payer: Self-pay

## 2024-05-08 ENCOUNTER — Other Ambulatory Visit: Payer: Self-pay

## 2024-05-08 ENCOUNTER — Other Ambulatory Visit (HOSPITAL_COMMUNITY): Payer: Self-pay

## 2024-05-08 ENCOUNTER — Encounter: Payer: Self-pay | Admitting: Family Medicine

## 2024-05-08 ENCOUNTER — Ambulatory Visit (INDEPENDENT_AMBULATORY_CARE_PROVIDER_SITE_OTHER): Payer: Medicare HMO | Admitting: Family Medicine

## 2024-05-08 VITALS — BP 120/68 | HR 58 | Temp 97.9°F | Ht 63.5 in | Wt 148.1 lb

## 2024-05-08 DIAGNOSIS — N138 Other obstructive and reflux uropathy: Secondary | ICD-10-CM

## 2024-05-08 DIAGNOSIS — N401 Enlarged prostate with lower urinary tract symptoms: Secondary | ICD-10-CM | POA: Diagnosis not present

## 2024-05-08 DIAGNOSIS — M818 Other osteoporosis without current pathological fracture: Secondary | ICD-10-CM | POA: Diagnosis not present

## 2024-05-08 DIAGNOSIS — E782 Mixed hyperlipidemia: Secondary | ICD-10-CM

## 2024-05-08 DIAGNOSIS — E559 Vitamin D deficiency, unspecified: Secondary | ICD-10-CM

## 2024-05-08 DIAGNOSIS — Z0000 Encounter for general adult medical examination without abnormal findings: Secondary | ICD-10-CM | POA: Diagnosis not present

## 2024-05-08 DIAGNOSIS — Z7189 Other specified counseling: Secondary | ICD-10-CM

## 2024-05-08 DIAGNOSIS — Z8781 Personal history of (healed) traumatic fracture: Secondary | ICD-10-CM | POA: Diagnosis not present

## 2024-05-08 DIAGNOSIS — Z87442 Personal history of urinary calculi: Secondary | ICD-10-CM

## 2024-05-08 DIAGNOSIS — E538 Deficiency of other specified B group vitamins: Secondary | ICD-10-CM

## 2024-05-08 DIAGNOSIS — R7303 Prediabetes: Secondary | ICD-10-CM

## 2024-05-08 DIAGNOSIS — I10 Essential (primary) hypertension: Secondary | ICD-10-CM | POA: Diagnosis not present

## 2024-05-08 DIAGNOSIS — N289 Disorder of kidney and ureter, unspecified: Secondary | ICD-10-CM

## 2024-05-08 LAB — MICROALBUMIN / CREATININE URINE RATIO
Creatinine,U: 153.2 mg/dL
Microalb Creat Ratio: 23.8 mg/g (ref 0.0–30.0)
Microalb, Ur: 3.7 mg/dL — ABNORMAL HIGH (ref 0.0–1.9)

## 2024-05-08 NOTE — Patient Instructions (Addendum)
Good to see you today You are doing well today.  Return as needed or in 1 year for next physical.

## 2024-05-08 NOTE — Assessment & Plan Note (Addendum)
 Discussed Prolia  cost/dosing and quick drop in medication effect after 6 month period. He would be due for repeat prolia  06/07/2024 - will await pricing Continue regular calcium , vit d supplementation.

## 2024-05-08 NOTE — Telephone Encounter (Signed)
 Prolia  VOB initiated via [email or web address]  Next Prolia  inj DUE: 06/07/24   PHARMACY BENEFIT: $930.81

## 2024-05-08 NOTE — Assessment & Plan Note (Signed)
 Preventative protocols reviewed and updated unless pt declined. Discussed healthy diet and lifestyle.

## 2024-05-08 NOTE — Assessment & Plan Note (Signed)
 Continue daily 1000mcg b12 replacement.

## 2024-05-08 NOTE — Assessment & Plan Note (Addendum)
Continue daily replacement 

## 2024-05-08 NOTE — Assessment & Plan Note (Signed)
 Stable period off medication.

## 2024-05-08 NOTE — Assessment & Plan Note (Signed)
 Previously discussed.

## 2024-05-08 NOTE — Progress Notes (Signed)
 Ph: [phone] Fax: [phone]   Patient ID: [name], male    DOB: April 02, 1941, 83 y.o.   MRN: [id number]  This visit was conducted in person.  BP 120/68   Pulse (!) 58   Temp 97.9 F (36.6 C) (Oral)   Ht 5' 3.5" (1.613 m)   Wt 148 lb 2 oz (67.2 kg)   SpO2 98%   BMI 25.83 kg/m    CC: CPE Subjective:   HPI: [name] is a 83 y.o. male presenting on 05/08/2024 for Annual Exam (MCR prt 2 [AWV- 04/02/24]. )   Saw health advisor 03/2024 for medicare wellness visit. Note reviewed.   No results found.  Flowsheet Row Clinical Support from 04/02/2024 in [facility]  PHQ-2 Total Score 2          04/02/2024    4:02 PM 11/07/2023    2:22 PM 03/30/2023    3:09 PM 12/16/2021   12:59 PM 08/14/2019   12:07 PM  Fall Risk   Falls in the past year? 0 0 0 0 0  Number falls in past yr: 0  0 0   Injury with Fall? 0  0 0   Risk for fall due to : No Fall Risks  No Fall Risks No Fall Risks   Follow up Falls prevention discussed;Falls evaluation completed  Falls prevention discussed;Falls evaluation completed Falls prevention discussed Falls evaluation completed;Falls prevention discussed   Memory evaluation 04/2023 - MMSE 23/30, CDT 2/4. 8th grade education. He notes he's always had trouble with learning, short term memory difficulty.   Osteoporosis - last visit we started prolia . Oral bisphosphonate not tolerated.  DEXA 08/2023: T -3.2 TFR, -2.7 L forearm  Prolia  shot 12/08/2023.   Chronic L index finger pain at PIP.    Preventative: Colon cancer screening - mod diverticulosis o/w WNL (2009). iFOB normal 08/2019. No blood in stool or bowel changes.  Prostate cancer screening - saw urology (Dr Ace Holder) for high PSA with 3 normal biopsies. Nocturia x2-3.  DEXA (05/2018) T score -3.7 at L spine DEXA 08/2023: T -3.2 TFR, -2.7 L forearm  Lung cancer screening - not eligible  Flu shot yearly  COVID vaccine Pfizer 01/2020, 02/2020, bivalent  12/2021 Pneumovax 01/2011, prevnar-13 2015,  Tdap 01/2011  zostavax - 2016 shingrix - 09/2019, 04/2020 Advanced directive: scanned 04/2018. HCPOA are daughter Wylie Heard and granddaughter Glennie Lank. Wants life prolonging measures as reasonably possible within limits of generally accepted health standards.  Seat belt use discussed Sunscreen use discussed. No changing moles on skin. Sees dermatologist year.  Non smoker Alcohol - none Dentist yearly  Eye exam yearly  Bowel - no constipation  Bladder - no incontinence   Lives with wife Ivette Marks)  1 grown daughter Occupation: retired Loss adjuster, chartered, worked on [employer] for 30 yrs Edu: 8th grade, GED Activity: no regular exercise Diet: good water, fruits/vegetables daily.     Relevant past medical, surgical, family and social history reviewed and updated as indicated. Interim medical history since our last visit reviewed. Allergies and medications reviewed and updated. Outpatient Medications Prior to Visit  Medication Sig Dispense Refill   Calcium  Carbonate-Vit D-Min (CALCIUM  600+D PLUS MINERALS) 600-400 MG-UNIT CHEW Chew 1 tablet by mouth daily.     Cholecalciferol (VITAMIN D3) 25 MCG (1000 UT) CAPS Take 1 capsule (1,000 Units total) by mouth daily. 30 capsule    cyanocobalamin  (VITAMIN B12) 1000 MCG tablet Take 1 tablet (1,000 mcg total)  by mouth daily.     metoprolol  succinate (TOPROL -XL) 25 MG 24 hr tablet Take 0.5 tablets (12.5 mg total) by mouth daily. 45 tablet 4   Facility-Administered Medications Prior to Visit  Medication Dose Route Frequency Provider Last Rate Last Admin   [START ON 06/07/2024] denosumab  (PROLIA ) injection 60 mg  60 mg Subcutaneous Once Ranyah Groeneveld, MD         Per HPI unless specifically indicated in ROS section below Review of Systems  Constitutional:  Negative for activity change, appetite change, chills, fatigue, fever and unexpected weight change.  HENT:  Negative for hearing loss.    Eyes:  Negative for visual disturbance.  Respiratory:  Negative for cough, chest tightness, shortness of breath and wheezing.   Cardiovascular:  Negative for chest pain, palpitations and leg swelling.  Gastrointestinal:  Negative for abdominal distention, abdominal pain, blood in stool, constipation, diarrhea, nausea and vomiting.  Genitourinary:  Negative for difficulty urinating and hematuria.  Musculoskeletal:  Positive for arthralgias (L 2nd index finger pain at PIP). Negative for myalgias and neck pain.  Skin:  Negative for rash.  Neurological:  Negative for dizziness, seizures, syncope and headaches.  Hematological:  Negative for adenopathy. Does not bruise/bleed easily.  Psychiatric/Behavioral:  Negative for dysphoric mood. The patient is not nervous/anxious.     Objective:  BP 120/68   Pulse (!) 58   Temp 97.9 F (36.6 C) (Oral)   Ht 5' 3.5" (1.613 m)   Wt 148 lb 2 oz (67.2 kg)   SpO2 98%   BMI 25.83 kg/m   Wt Readings from Last 3 Encounters:  05/08/24 148 lb 2 oz (67.2 kg)  04/02/24 145 lb (65.8 kg)  11/07/23 150 lb (68 kg)      Physical Exam Vitals and nursing note reviewed.  Constitutional:      General: He is not in acute distress.    Appearance: Normal appearance. He is well-developed. He is not ill-appearing.  HENT:     Head: Normocephalic and atraumatic.     Right Ear: Hearing, tympanic membrane, ear canal and external ear normal.     Left Ear: Hearing, tympanic membrane, ear canal and external ear normal.     Nose: Nose normal.     Mouth/Throat:     Mouth: Mucous membranes are moist.     Pharynx: Oropharynx is clear. No oropharyngeal exudate or posterior oropharyngeal erythema.  Eyes:     General: No scleral icterus.    Extraocular Movements: Extraocular movements intact.     Conjunctiva/sclera: Conjunctivae normal.     Pupils: Pupils are equal, round, and reactive to light.  Neck:     Thyroid : No thyroid  mass or thyromegaly.     Vascular: No carotid  bruit.  Cardiovascular:     Rate and Rhythm: Normal rate and regular rhythm.     Pulses: Normal pulses.          Radial pulses are 2+ on the right side and 2+ on the left side.     Heart sounds: Normal heart sounds. No murmur heard. Pulmonary:     Effort: Pulmonary effort is normal. No respiratory distress.     Breath sounds: Normal breath sounds. No wheezing, rhonchi or rales.  Abdominal:     General: Bowel sounds are normal. There is no distension.     Palpations: Abdomen is soft. There is no mass.     Tenderness: There is no abdominal tenderness. There is no guarding or rebound.  Hernia: No hernia is present.  Musculoskeletal:        General: Normal range of motion.     Cervical back: Normal range of motion and neck supple.     Right lower leg: No edema.     Left lower leg: No edema.  Lymphadenopathy:     Cervical: No cervical adenopathy.  Skin:    General: Skin is warm and dry.     Findings: No rash.  Neurological:     General: No focal deficit present.     Mental Status: He is alert and oriented to person, place, and time.  Psychiatric:        Mood and Affect: Mood normal.        Behavior: Behavior normal.        Thought Content: Thought content normal.        Judgment: Judgment normal.       Results for orders placed or performed in visit on 05/01/24  Vitamin B12   Collection Time: 05/01/24  1:53 PM  Result Value Ref Range   Vitamin B-12 619 211 - 911 pg/mL  Hemoglobin A1c   Collection Time: 05/01/24  1:53 PM  Result Value Ref Range   Hgb A1c MFr Bld 5.6 4.6 - 6.5 %  TSH   Collection Time: 05/01/24  1:53 PM  Result Value Ref Range   TSH 2.03 0.35 - 5.50 uIU/mL  CBC with Differential/Platelet   Collection Time: 05/01/24  1:53 PM  Result Value Ref Range   WBC 6.6 4.0 - 10.5 K/uL   RBC 4.89 4.22 - 5.81 Mil/uL   Hemoglobin 14.4 13.0 - 17.0 g/dL   HCT 44.0 10.2 - 72.5 %   MCV 90.3 78.0 - 100.0 fl   MCHC 32.5 30.0 - 36.0 g/dL   RDW 36.6 44.0 - 34.7 %    Platelets 142.0 (L) 150.0 - 400.0 K/uL   Neutrophils Relative % 63.6 43.0 - 77.0 %   Lymphocytes Relative 21.3 12.0 - 46.0 %   Monocytes Relative 11.0 3.0 - 12.0 %   Eosinophils Relative 3.2 0.0 - 5.0 %   Basophils Relative 0.9 0.0 - 3.0 %   Neutro Abs 4.2 1.4 - 7.7 K/uL   Lymphs Abs 1.4 0.7 - 4.0 K/uL   Monocytes Absolute 0.7 0.1 - 1.0 K/uL   Eosinophils Absolute 0.2 0.0 - 0.7 K/uL   Basophils Absolute 0.1 0.0 - 0.1 K/uL  Parathyroid  hormone, intact (no Ca)   Collection Time: 05/01/24  1:53 PM  Result Value Ref Range   PTH 30 16 - 77 pg/mL  VITAMIN D  25 Hydroxy (Vit-D Deficiency, Fractures)   Collection Time: 05/01/24  1:53 PM  Result Value Ref Range   VITD 69.74 30.00 - 100.00 ng/mL  Phosphorus   Collection Time: 05/01/24  1:53 PM  Result Value Ref Range   Phosphorus 4.0 2.3 - 4.6 mg/dL  Comprehensive metabolic panel with GFR   Collection Time: 05/01/24  1:53 PM  Result Value Ref Range   Sodium 142 135 - 145 mEq/L   Potassium 4.2 3.5 - 5.1 mEq/L   Chloride 108 96 - 112 mEq/L   CO2 27 19 - 32 mEq/L   Glucose, Bld 88 70 - 99 mg/dL   BUN 21 6 - 23 mg/dL   Creatinine, Ser 4.25 0.40 - 1.50 mg/dL   Total Bilirubin 0.8 0.2 - 1.2 mg/dL   Alkaline Phosphatase 62 39 - 117 U/L   AST 16 0 - 37 U/L  ALT 7 0 - 53 U/L   Total Protein 6.5 6.0 - 8.3 g/dL   Albumin 3.9 3.5 - 5.2 g/dL   GFR 16.10 (L) >96.04 mL/min   Calcium  8.9 8.4 - 10.5 mg/dL  Lipid panel   Collection Time: 05/01/24  1:53 PM  Result Value Ref Range   Cholesterol 154 0 - 200 mg/dL   Triglycerides 540.9 0.0 - 149.0 mg/dL   HDL 81.19 >14.78 mg/dL   VLDL 29.5 0.0 - 62.1 mg/dL   LDL Cholesterol 88 0 - 99 mg/dL   Total CHOL/HDL Ratio 4    NonHDL 113.67     Assessment & Plan:   Problem List Items Addressed This Visit     Health maintenance examination - Primary (Chronic)   Preventative protocols reviewed and updated unless pt declined. Discussed healthy diet and lifestyle.       Advanced care  planning/counseling discussion (Chronic)   Previously discussed.       History of kidney stones   No further kidney stones since he started drinking more lemonade.       BPH with urinary obstruction   Stable period off medication.       History of vertebral compression fracture   Osteoporosis   Discussed Prolia  cost/dosing and quick drop in medication effect after 6 month period. He would be due for repeat prolia  06/07/2024 - will await pricing Continue regular calcium , vit d supplementation.       RESOLVED: HLD (hyperlipidemia)   Chronic, great control off medication. Will remove this from problem list.       Hypertension   Chronic, stable on low dose Toprol  XL      Vitamin D  deficiency   Continue daily replacement.       Low serum vitamin B12   Continue daily 1000mcg b12 replacement.         No orders of the defined types were placed in this encounter.   No orders of the defined types were placed in this encounter.   Patient Instructions  Good to see you today You are doing well today Return as needed or in 1 year for next physical   Follow up plan: Return in about 1 year (around 05/08/2025) for annual exam, prior fasting for blood work, medicare wellness visit.  Claire Crick, MD

## 2024-05-08 NOTE — Assessment & Plan Note (Signed)
 Chronic, stable on low dose Toprol XL

## 2024-05-08 NOTE — Assessment & Plan Note (Signed)
 Chronic, great control off medication. Will remove this from problem list.

## 2024-05-08 NOTE — Assessment & Plan Note (Signed)
 No further kidney stones since he started drinking more lemonade.

## 2024-05-10 ENCOUNTER — Ambulatory Visit: Payer: Self-pay | Admitting: Family Medicine

## 2024-05-10 NOTE — Telephone Encounter (Signed)
 [name]

## 2024-05-10 NOTE — Telephone Encounter (Signed)
 Pt ready for scheduling for PROLIA  on or after : 06/07/24  Option# 1: Buy/Bill (Office supplied medication)  Out-of-pocket cost due at time of clinic visit: $357  Number of injection/visits approved: 2  Primary: [employer] Prolia  co-insurance: 20% Admin fee co-insurance: 20%  Secondary: --- Prolia  co-insurance:  Admin fee co-insurance:   Medical Benefit Details: Date Benefits were checked: 05/10/24 Deductible: NO/ Coinsurance: 20%/ Admin Fee: 20%  Prior Auth: APPROVED PA# [id number] Expiration Date: 11/22/23-12/12/24  # of doses approved: 2 ----------------------------------------------------------------------- Option# 2- Med Obtained from pharmacy:  Pharmacy benefit: Copay $930.81 (Paid to pharmacy) Admin Fee: 20% (Pay at clinic)  Prior Auth: N/A PA# Expiration Date:   # of doses approved:   If patient wants fill through the pharmacy benefit please send prescription to: [employer], and include estimated need by date in rx notes. Pharmacy will ship medication directly to the office.  Patient NOT eligible for Prolia  Copay Card. Copay Card can make patient's cost as little as $25. Link to apply: [email or web address]://[email or web address]  ** This summary of benefits is an estimation of the patient's out-of-pocket cost. Exact cost may very based on individual plan coverage.

## 2024-05-18 NOTE — Telephone Encounter (Signed)
 Spoke with pt and reviewed all of the below information. States that $357 is too much for him to afford at this time. Reports that he has been taking vitamins for his bone health. Will send a message to Dr. Crissie Dome to make him aware of this.

## 2024-07-16 DIAGNOSIS — D044 Carcinoma in situ of skin of scalp and neck: Secondary | ICD-10-CM | POA: Diagnosis not present

## 2024-07-16 DIAGNOSIS — C44319 Basal cell carcinoma of skin of other parts of face: Secondary | ICD-10-CM | POA: Diagnosis not present

## 2024-07-16 DIAGNOSIS — D485 Neoplasm of uncertain behavior of skin: Secondary | ICD-10-CM | POA: Diagnosis not present

## 2024-07-16 DIAGNOSIS — B079 Viral wart, unspecified: Secondary | ICD-10-CM | POA: Diagnosis not present

## 2024-08-22 ENCOUNTER — Other Ambulatory Visit: Payer: Self-pay | Admitting: Family Medicine

## 2024-08-22 DIAGNOSIS — I10 Essential (primary) hypertension: Secondary | ICD-10-CM

## 2024-09-13 DIAGNOSIS — D044 Carcinoma in situ of skin of scalp and neck: Secondary | ICD-10-CM | POA: Diagnosis not present

## 2024-09-27 DIAGNOSIS — C44319 Basal cell carcinoma of skin of other parts of face: Secondary | ICD-10-CM | POA: Diagnosis not present

## 2024-12-20 ENCOUNTER — Telehealth: Payer: Self-pay | Admitting: Family Medicine

## 2024-12-20 DIAGNOSIS — M818 Other osteoporosis without current pathological fracture: Secondary | ICD-10-CM

## 2024-12-20 NOTE — Assessment & Plan Note (Addendum)
 DEXA 08/2023: T -3.2 TFR, -2.7 L forearm  Received prolia  shot 11/2023, however future cost was unaffordable $357/dose.  Oral bisphosphonate intolerance.  Latest CrCl 33. Not great candidate for IV bisphosphonate infusion either.  Offer endocrinology vs OV to discuss PTH analog.

## 2024-12-20 NOTE — Telephone Encounter (Signed)
 H/o intolerance to oral bisphosphonate, and prolia  shots became unaffordable ($350/shot).  Would he be interested in pricing out daily shot for osteoporosis (Forteo)? Would offer OV with me to review options or would offer endocrinology referral for further discussion on osteoporosis treatment options.   Lab Results  Component Value Date   NA 142 05/01/2024   CL 108 05/01/2024   K 4.2 05/01/2024   CO2 27 05/01/2024   BUN 21 05/01/2024   CREATININE 1.32 05/01/2024   GFR 49.44 (L) 05/01/2024   CALCIUM  8.9 05/01/2024   PHOS 4.0 05/01/2024   ALBUMIN 3.9 05/01/2024   GLUCOSE 88 05/01/2024   CrCl = 33

## 2024-12-26 NOTE — Telephone Encounter (Signed)
 Called and spoke with patient. He states nothing is bothering him at the moment and that he does not have osteoporosis only arthritis in his hand. PT educated on what osteoporosis is and risk factors. Pt states that he appreciates us  checking in but that he is okay at the movement. Pt did not want to shots or referral at this time. States he will just see us  at his next appt on 04/03/2025.

## 2025-04-03 ENCOUNTER — Ambulatory Visit

## 2025-05-13 ENCOUNTER — Encounter: Admitting: Family Medicine

## 2025-05-20 ENCOUNTER — Encounter: Admitting: Family Medicine

## 2025-05-20 ENCOUNTER — Ambulatory Visit
